# Patient Record
Sex: Male | Born: 1940 | Race: White | Hispanic: No | Marital: Married | Smoking: Former smoker
Health system: Southern US, Community
[De-identification: ages and names within clinical notes are randomized; demographics above are authoritative.]

## PROBLEM LIST (undated history)

## (undated) DIAGNOSIS — E785 Hyperlipidemia, unspecified: Secondary | ICD-10-CM

## (undated) DIAGNOSIS — I219 Acute myocardial infarction, unspecified: Secondary | ICD-10-CM

## (undated) DIAGNOSIS — I739 Peripheral vascular disease, unspecified: Secondary | ICD-10-CM

## (undated) DIAGNOSIS — I519 Heart disease, unspecified: Secondary | ICD-10-CM

## (undated) DIAGNOSIS — I10 Essential (primary) hypertension: Secondary | ICD-10-CM

## (undated) DIAGNOSIS — G4730 Sleep apnea, unspecified: Secondary | ICD-10-CM

## (undated) DIAGNOSIS — I2510 Atherosclerotic heart disease of native coronary artery without angina pectoris: Secondary | ICD-10-CM

## (undated) DIAGNOSIS — E119 Type 2 diabetes mellitus without complications: Secondary | ICD-10-CM

## (undated) HISTORY — DX: Peripheral vascular disease, unspecified: I73.9

## (undated) HISTORY — PX: GALLBLADDER SURGERY: SHX652

## (undated) HISTORY — DX: Atherosclerotic heart disease of native coronary artery without angina pectoris: I25.10

## (undated) HISTORY — PX: LEG SURGERY: SHX1003

## (undated) HISTORY — DX: Type 2 diabetes mellitus without complications: E11.9

## (undated) HISTORY — DX: Acute myocardial infarction, unspecified: I21.9

## (undated) HISTORY — DX: Hyperlipidemia, unspecified: E78.5

## (undated) HISTORY — DX: Essential (primary) hypertension: I10

## (undated) HISTORY — DX: Sleep apnea, unspecified: G47.30

## (undated) HISTORY — DX: Heart disease, unspecified: I51.9

---

## 1987-02-16 HISTORY — PX: CARDIAC CATHETERIZATION: SHX172

## 1988-02-16 HISTORY — PX: CARDIAC CATHETERIZATION: SHX172

## 2005-02-03 ENCOUNTER — Ambulatory Visit: Payer: Self-pay | Admitting: Otolaryngology

## 2007-04-07 ENCOUNTER — Emergency Department: Payer: Self-pay | Admitting: Emergency Medicine

## 2007-04-07 ENCOUNTER — Other Ambulatory Visit: Payer: Self-pay

## 2007-04-12 ENCOUNTER — Ambulatory Visit: Payer: Self-pay | Admitting: Internal Medicine

## 2008-05-24 ENCOUNTER — Ambulatory Visit: Payer: Self-pay | Admitting: Unknown Physician Specialty

## 2008-11-30 ENCOUNTER — Ambulatory Visit: Payer: Self-pay | Admitting: Unknown Physician Specialty

## 2011-10-04 ENCOUNTER — Ambulatory Visit: Payer: Self-pay | Admitting: Vascular Surgery

## 2011-10-04 LAB — BASIC METABOLIC PANEL
BUN: 23 mg/dL — ABNORMAL HIGH (ref 7–18)
Calcium, Total: 9.5 mg/dL (ref 8.5–10.1)
Chloride: 95 mmol/L — ABNORMAL LOW (ref 98–107)
Co2: 29 mmol/L (ref 21–32)
Creatinine: 0.97 mg/dL (ref 0.60–1.30)
Glucose: 182 mg/dL — ABNORMAL HIGH (ref 65–99)
Osmolality: 273 (ref 275–301)
Sodium: 132 mmol/L — ABNORMAL LOW (ref 136–145)

## 2012-05-05 ENCOUNTER — Encounter: Payer: Self-pay | Admitting: Neurology

## 2012-05-16 ENCOUNTER — Encounter: Payer: Self-pay | Admitting: Neurology

## 2012-06-15 ENCOUNTER — Encounter: Payer: Self-pay | Admitting: Neurology

## 2012-07-16 ENCOUNTER — Encounter: Payer: Self-pay | Admitting: Neurology

## 2012-08-15 ENCOUNTER — Encounter: Payer: Self-pay | Admitting: Neurology

## 2012-11-06 ENCOUNTER — Ambulatory Visit: Payer: Self-pay | Admitting: Internal Medicine

## 2012-11-15 ENCOUNTER — Ambulatory Visit: Payer: Self-pay | Admitting: Internal Medicine

## 2012-12-16 ENCOUNTER — Ambulatory Visit: Payer: Self-pay | Admitting: Internal Medicine

## 2013-05-24 DIAGNOSIS — K625 Hemorrhage of anus and rectum: Secondary | ICD-10-CM | POA: Insufficient documentation

## 2013-08-28 DIAGNOSIS — E538 Deficiency of other specified B group vitamins: Secondary | ICD-10-CM | POA: Insufficient documentation

## 2013-08-28 DIAGNOSIS — E039 Hypothyroidism, unspecified: Secondary | ICD-10-CM | POA: Insufficient documentation

## 2013-09-10 DIAGNOSIS — R270 Ataxia, unspecified: Secondary | ICD-10-CM | POA: Insufficient documentation

## 2013-09-10 DIAGNOSIS — M549 Dorsalgia, unspecified: Secondary | ICD-10-CM | POA: Insufficient documentation

## 2014-06-04 NOTE — Op Note (Signed)
PATIENT NAME:  Allen [name], Allen [name] MR#:  [id number] DATE OF BIRTH:  Jun 27, 1940  DATE OF PROCEDURE:  10/04/2011  PREOPERATIVE DIAGNOSES:  1. Peripheral arterial disease with claudication, right lower extremity.  2. Diabetes.   POSTOPERATIVE DIAGNOSES:  1. Peripheral arterial disease with claudication, right lower extremity.  2. Diabetes.   PROCEDURE: 1. Catheter placement into right popliteal artery from left femoral approach.  2. Aortogram and selective right lower extremity angiogram. 3. Percutaneous transluminal angioplasty of distal superficial femoral artery with 6-mm diameter angioplasty balloon.  4. Self-expanding stent placement to the distal superficial femoral artery for greater than 50% residual stenosis after angioplasty.  5. StarClose closure device, left femoral artery.   SURGEON: Algernon Huxley, M.D.   ANESTHESIA: Local with moderate conscious sedation.   ESTIMATED BLOOD LOSS: 25 mL.   FLUOROSCOPY TIME: Six minutes.  CONTRAST: 30 mL.   INDICATION FOR PROCEDURE: This is a 74 year old white male with peripheral arterial disease. He has right lower extremity claudication, which is lifestyle limiting. He was seen in the office and given the option of intervention versus conservative management with exercise regimen. He trialed that but had persistent symptoms and was offered intervention. Risks and benefits were discussed. Informed consent was obtained.   DESCRIPTION OF PROCEDURE: The patient was brought to the vascular and interventional radiology suite. Groins were shaved and prepped and a sterile surgical field was created. The left femoral head was localized with fluoroscopy and the left femoral artery was accessed without difficulty with a micropuncture needle. Micropuncture wire and sheath were placed. I then up-sized to a 5 [place] sheath over a J-wire. A pigtail catheter was placed in the aorta at the L1-L2 level. AP aortogram was performed. This showed no flow-limiting stenosis  in the aortoiliac segments with patent renal arteries bilaterally. I then hooked the aortic bifurcation and advanced to the right femoral head. Selective right lower extremity angiogram was then performed. This demonstrated patent common femoral arteries and profunda femoris artery. The superficial femoral artery was patent proximally. In the distal segment there was an area of heavy calcification was short-segment occlusion, which reconstituted at Hunter's canal. The popliteal artery was then patent and there was two-vessel runoff distally. The patient was systemically heparinized. A 6 French high flex Ansel sheath was placed over a Terumo advantage wire. I was able to cross the lesion without difficulty with a Terumo advantage wire and a Kumpe catheter and confirm intraluminal flow in the popliteal artery. I then replaced the wire and percutaneous transluminal angioplasty was performed of the SFA lesion with two inflations with a 6-mm diameter angioplasty balloon. Following this there was still a greater than 50% residual stenosis and I elected to treat this with a self-expanding stent. A 7-mm diameter x 12-cm in length self-expanding stent was placed and ironed out with a 6-mm balloon with excellent angiographic completion result and only mild residual stenosis. At this point I elected to terminate the procedure. The sheath was removed. StarClose closure device was deployed in the usual fashion with excellent hemostatic result. The patient tolerated the procedure well and was taken to the recovery room in stable condition.     ____________________________ Algernon Huxley, MD jsd:bjt D: 10/04/2011 09:33:57 ET T: 10/04/2011 11:05:25 ET JOB#: [id number]  cc: Algernon Huxley, MD, <Dictator> Algernon Huxley MD ELECTRONICALLY SIGNED 10/05/2011 8:29

## 2014-06-14 DIAGNOSIS — Z8679 Personal history of other diseases of the circulatory system: Secondary | ICD-10-CM | POA: Insufficient documentation

## 2014-08-22 ENCOUNTER — Other Ambulatory Visit: Payer: Self-pay | Admitting: Neurology

## 2014-08-22 DIAGNOSIS — R262 Difficulty in walking, not elsewhere classified: Secondary | ICD-10-CM | POA: Insufficient documentation

## 2014-08-22 DIAGNOSIS — M545 Low back pain, unspecified: Secondary | ICD-10-CM

## 2014-08-28 ENCOUNTER — Ambulatory Visit
Admission: RE | Admit: 2014-08-28 | Discharge: 2014-08-28 | Disposition: A | Discharge status: Home / Self Care | Payer: Medicare Other | Source: Ambulatory Visit | Attending: Neurology | Admitting: Neurology

## 2014-08-28 DIAGNOSIS — M4807 Spinal stenosis, lumbosacral region: Secondary | ICD-10-CM | POA: Insufficient documentation

## 2014-08-28 DIAGNOSIS — M545 Low back pain, unspecified: Secondary | ICD-10-CM

## 2014-08-28 DIAGNOSIS — M7138 Other bursal cyst, other site: Secondary | ICD-10-CM | POA: Diagnosis not present

## 2014-08-28 DIAGNOSIS — M4806 Spinal stenosis, lumbar region: Secondary | ICD-10-CM | POA: Insufficient documentation

## 2014-10-03 ENCOUNTER — Ambulatory Visit: Payer: Medicare Other | Attending: Neurology | Admitting: Physical Therapy

## 2014-10-03 ENCOUNTER — Encounter: Payer: Self-pay | Admitting: Physical Therapy

## 2014-10-03 DIAGNOSIS — M545 Low back pain, unspecified: Secondary | ICD-10-CM

## 2014-10-03 DIAGNOSIS — R29898 Other symptoms and signs involving the musculoskeletal system: Secondary | ICD-10-CM | POA: Diagnosis present

## 2014-10-03 NOTE — Patient Instructions (Signed)
(  Clinic) Flexion: Pelvic Tilt   Lie with neck supported, knees bent, feet flat. Tighten and suck stomach in, pushing back down against surface. Do not push down with legs. Repeat _12___ times per set. Do __2__ sets per session. Do __5__ sessions per week.  Copyright  VHI. All rights reserved.  Diaphragmatic - Supine   Inhale through nose making navel move out toward hands. Exhale through puckered lips, hands follow navel in. Repeat _15__ times. Rest __2_ seconds between repeats. Do __3_ times per day.  Copyright  VHI. All rights reserved.

## 2014-10-04 NOTE — Therapy (Addendum)
[place] [facility] SERVICES [address] [place], [place], [address] Phone: [phone]   Fax:  [phone]  Physical Therapy Evaluation  Patient Details  Name: Allen [name] MRN: [id number] Date of Birth: 01/23/1941 Referring Provider:  Anabel Bene, MD  Encounter Date: 10/03/2014      PT End of Session - 10/07/14 0912    Visit Number 1   Number of Visits 17   Date for PT Re-Evaluation 11/28/14   Authorization Type gcode 1   Authorization Time Period 10    PT Start Time 1605   PT Stop Time 1710   PT Time Calculation (min) 65 min   Equipment Utilized During Treatment Gait belt   Activity Tolerance Patient limited by pain;Patient tolerated treatment well   Behavior During Therapy [facility] for tasks assessed/performed      Past Medical History  Diagnosis Date  . Diabetes mellitus without complication   . Hypertension   . Heart disease     No past surgical history on file.  There were no vitals filed for this visit.  Visit Diagnosis:  Leg weakness, bilateral - Plan: PT plan of care cert/re-cert  Bilateral low back pain without sciatica - Plan: PT plan of care cert/re-cert      Subjective Assessment - 10/07/14 0911    Subjective This patient is a pleasant 74 year old male that reports to PT with severe back pain with standing and walking (8/10 at worst, 1/10 at best) . Over the past month the pain has gotten much worse causing significant decrease in physical activity. He states that he has recently developed radicular symptoms into the lateral and anterior portion of his proximal thighs. Pain in his low back started 20 years ago, over the last 5-6 years the pain has increased steadily, with loss of function due to pain within the last couple of months.    Pertinent History Heart attack in 1989, second heart attack in 1990. History of diabetes that is controlled . Stent placed in the R LE 2-3 years prior. PVD, CAD, Hypertension.    Limitations Standing;Walking;House hold activities   How long can you sit comfortably? 10-15 minutes and he has to adjust    How long can you stand comfortably? not able to stand without discomfort    How long can you walk comfortably? approx 500 ft.    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 be able to kayak.    Pain Onset More than a month ago            [facility] PT Assessment - 10/07/14 0001    Assessment   Medical Diagnosis Difficulty walking/ chronic back pain   Onset Date/Surgical Date 09/12/14   Hand Dominance Left   Next MD Visit 11/2014    Prior Therapy PT 3 years prior for back pain. Reports that he was able to walk with greater confidence, but still had pain    Precautions   Precautions Fall   Precaution Comments no sensation in the [place] residence   Living Arrangements Spouse/significant other   Available Help at Discharge Family   Type of [place] to enter   Entrance Stairs-Number of Steps 4   Entrance Stairs-Rails None   Home  Layout One level   Home Equipment Grab bars - tub/shower;Kasandra Knudsen - single point   Prior Function   Level of Independence Independent with basic ADLs   Vocation Retired   Leisure Kayak go for walks    Cognition   Overall Cognitive Status Within Functional Limits for tasks assessed   Observation/Other Assessments   Modified [place] 52% impaired, (indicates severe disability)    Sensation   Light Touch Impaired by gross assessment   Additional Comments Decreased sensation distal to mid calf region    Posture/Postural Control   Posture Comments Patient demonstrates forward head and rounded shoulders in sitting and standing. Slight increase in lumbar lordosis in stance.    AROM   Overall AROM Comments Patient demonstrates  grossly UE motion within normal limits. LE AROM  grossly within funcitonal limits.    PROM   Overall PROM Comments Hip flexion with over pressure causes significant increase in low back pain. Hip extension in sidelying beyond neutral increases pain in the low back    Strength   Overall Strength Comments Bilateral UE 5/5 throughout.    Right Hip Flexion 4/5   Right Hip Extension 4/5   Right Hip ABduction 4+/5   Right Hip ADduction 4+/5   Left Hip Flexion 4/5   Left Hip Extension 4/5   Left Hip ABduction 4+/5   Left Hip ADduction 4+/5   Right Knee Flexion 4/5   Right Knee Extension 4+/5   Left Knee Flexion 4/5   Left Knee Extension 4+/5   Right Ankle Dorsiflexion 4/5   Right Ankle Plantar Flexion 4-/5   Left Ankle Dorsiflexion 4/5   Left Ankle Plantar Flexion 4-/5   Palpation   Palpation comment Sensitive to grade II central PA as well as R and L rotational mobilization to L3-L5, with pain in the lumbar spine. No radicular symptoms noted,   Special Tests    Special Tests Lumbar   Straight Leg Raise   Findings Positive   Side  Right  and positive on L   Comment Pain in the Low back at approx 60 degrees for hip flexion, no radicular symptoms noted.    Bed Mobility   Bed Mobility --  Independent but painful   Transfers   Comments Independent with use of hands    Ambulation/Gait   Gait Comments Patient ambulates with SPC, wide BOS, externally rotated LE bilaterally, decreased cadence, decreased step length and height. Reports that he has radicular symptoms following 74mWT in anterior thigh.    Standardized Balance Assessment   Five times sit to stand comments  20 seconds (> 15 seconds indicates fall risk )    10 Meter Walk 0.55 m/s ( <1.0 m/s indicates fall risk)   Berg Balance Test   Sit to Stand Able to stand  independently using hands   Standing Unsupported Able to stand 30 seconds unsupported  due to back pain    Sitting with Back Unsupported but Feet Supported on Floor or  Stool Able to sit safely and securely 2 minutes   Stand to Sit Sits safely with minimal use of hands   Transfers Able to transfer safely, minor use of hands   Standing Unsupported with Eyes Closed Able to stand 10 seconds safely   Standing Ubsupported with Feet Together Able to place feet together independently and stand 1 minute safely   From Standing, Reach Forward with Outstretched Arm Can reach forward >12 cm safely (5")   From Standing Position, Pick up Object from  Floor Able to pick up shoe safely and easily   From Standing Position, Turn to Look Behind Over each Shoulder Looks behind from both sides and weight shifts well   Turn 360 Degrees Able to turn 360 degrees safely one side only in 4 seconds or less   Standing Unsupported, Alternately Place Feet on Step/Stool Able to stand independently and safely and complete 8 steps in 20 seconds   Standing Unsupported, One Foot in Front Needs help to step but can hold 15 seconds   Standing on One Leg Able to lift leg independently and hold equal to or more than 3 seconds   Total Score 46   High Level Balance   High Level Balance Comments Berg balance scale indicates moderate fall risk.                            PT Education - 10/07/14 0911    Education provided Yes   Education Details plan of care, HEP- see patient instructions   Person(s) Educated Patient   Methods Explanation;Demonstration;Tactile cues;Verbal cues;Handout   Comprehension Verbalized understanding;Returned demonstration;Verbal cues required;Tactile cues required             PT Long Term Goals - 10/04/14 1159    PT LONG TERM GOAL #1   Title Patient will be independent with HEP by 11/28/14 to allow greater function within the home by 11/28/14   Time 8   Period Weeks   Status New   PT LONG TERM GOAL #2   Title [place] will be able to stand for 10 minutes with only slight pain in the low back to allow him to perform meal prep at home by 10/13    Time 8   Period Weeks   Status New   PT LONG TERM GOAL #3   Title Patient will improve modified ODI to <40% to indicate only moderate disability by 10/13   Time 8   Period Weeks   Status New   PT LONG TERM GOAL #4   Title Patient will increase all LE MMT to 5/5 in hip/knee to allow greater ease of access to the community by 10/13   Time 8   Period Weeks   Status New   PT LONG TERM GOAL #5   Title Patient will complete 6 min walk test of >1000 feet with maximum low back pain of <2/10 to improve community ambulation tolerance by 11/28/14.   Time 8   Period Weeks   Status New               Plan - 10/07/14 0912    Pt will benefit from skilled therapeutic intervention in order to improve on the following deficits Abnormal gait;Decreased activity tolerance;Decreased balance;Decreased coordination;Decreased endurance;Decreased mobility;Decreased knowledge of use of DME;Decreased range of motion;Decreased strength;Difficulty walking;Hypomobility;Increased fascial restricitons;Increased muscle spasms;Impaired perceived functional ability;Impaired flexibility;Impaired sensation;Improper body mechanics;Postural dysfunction;Obesity;Pain   Rehab Potential Fair   Clinical Impairments Affecting Rehab Potential Negative: Chronic condition, co-morbidities. Positive: motivated to improve, good results from previous PT.    PT Frequency 2x / week   PT Duration 8 weeks   PT Treatment/Interventions ADLs/Self Care Home Management;Cryotherapy;Electrical Stimulation;Moist Heat;Traction;Ultrasound;DME Instruction;Gait training;Stair training;Functional mobility training;Therapeutic activities;Therapeutic exercise;Balance training;Neuromuscular re-education;Patient/family education;Manual techniques;Passive range of motion;Dry needling;Energy conservation   PT Next Visit Plan increased core stabilizaiton    PT Home Exercise Plan HEP initiated - see patient instructions    Consulted and Agree with Plan of  Care  Patient         Problem List There are no active problems to display for this patient.  Barrie Folk SPT 10/07/2014   9:13 AM  This entire session was performed under direct supervision and direction of a licensed therapist . I have personally read, edited and approve of the note as written.  Hopkins,Margaret PT,DPT 10/07/2014, 9:13 AM  [place] [facility] SERVICES [address] [place], [place], [address] Phone: [phone]   Fax:  [phone]

## 2014-10-07 ENCOUNTER — Ambulatory Visit: Payer: Medicare Other | Admitting: Physical Therapy

## 2014-10-07 ENCOUNTER — Encounter: Payer: Self-pay | Admitting: Physical Therapy

## 2014-10-07 DIAGNOSIS — R29898 Other symptoms and signs involving the musculoskeletal system: Secondary | ICD-10-CM | POA: Diagnosis not present

## 2014-10-07 DIAGNOSIS — M545 Low back pain, unspecified: Secondary | ICD-10-CM

## 2014-10-07 NOTE — Therapy (Signed)
[place] MAIN [facility] SERVICES [address] [place], [place], [address] Phone: [phone]   Fax:  [phone]  Physical Therapy Treatment  Patient Details  Name: Allen [name] MRN: [id number] Date of Birth: [birth year] Referring Provider:  Anabel Bene, MD  Encounter Date: 10/07/2014      PT End of Session - 10/07/14 1518    Visit Number 2   Number of Visits 17   Date for PT Re-Evaluation 11/28/14   Authorization Type gcode 1   Authorization Time Period 10    PT Start Time 0310   PT Stop Time 0355   PT Time Calculation (min) 45 min   Equipment Utilized During Treatment Gait belt   Activity Tolerance Patient limited by pain;Patient tolerated treatment well   Behavior During Therapy [facility] for tasks assessed/performed      Past Medical History  Diagnosis Date  . Diabetes mellitus without complication   . Hypertension   . Heart disease     History reviewed. No pertinent past surgical history.  There were no vitals filed for this visit.  Visit Diagnosis:  Leg weakness, bilateral  Bilateral low back pain without sciatica      Subjective Assessment - 10/07/14 1516    Subjective Patient says that he was here 3 years ago to work on his balance and now is here because his back pain is constant as soon as he stands up and the pain is also going down towards his knees. His pain in 5/10 today.    Pertinent History Heart attack in 1989, second heart attack in 1990. History of diabetes that is controlled . Stent placed in the R LE 2-3 years prior. PVD, CAD, Hypertension.    How long can you sit comfortably? 10-15 minutes and he has to adjust    How long can you stand comfortably? not able to stand without discomfort    How long can you walk comfortably? approx 500 ft.    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 Yes   Pain Score 5     Pain Location Back            [facility] PT Assessment - 10/07/14 0001    Assessment   Medical Diagnosis Difficulty walking/ chronic back pain   Onset Date/Surgical Date 09/12/14   Hand Dominance Left   Next MD Visit 11/2014    Prior Therapy PT 3 years prior for back pain. Reports that he was able to walk with greater confidence, but still had pain    Precautions   Precautions Fall   Precaution Comments no sensation in the [place] residence   Living Arrangements Spouse/significant other   Available Help at Discharge Family   Type of [place] to enter   Entrance Stairs-Number of Steps 4   Entrance Stairs-Rails None   Home Layout One level   [place] bars - tub/shower;Kasandra Knudsen - single point   Prior Function   Level of Independence Independent with basic ADLs   Vocation Retired   Leisure [place] go for walks    Cognition   Overall Cognitive Status Within Functional Limits for tasks assessed   Observation/Other Assessments   Modified [place] 52% impaired, (indicates severe disability)    Sensation   Light Touch Impaired by gross assessment  Additional Comments Decreased sensation distal to mid calf region    Posture/Postural Control   Posture Comments Patient demonstrates forward head and rounded shoulders in sitting and standing. Slight increase in lumbar lordosis in stance.    AROM   Overall AROM Comments Patient demonstrates grossly UE motion within normal limits. LE AROM  grossly within funcitonal limits.    PROM   Overall PROM Comments Hip flexion with over pressure causes significant increase in low back pain. Hip extension in sidelying beyond neutral increases pain in the low back    Strength   Overall Strength Comments Bilateral UE 5/5 throughout.    Right Hip Flexion 4/5   Right Hip Extension 4/5   Right Hip ABduction 4+/5   Right Hip ADduction 4+/5   Left Hip Flexion 4/5   Left Hip  Extension 4/5   Left Hip ABduction 4+/5   Left Hip ADduction 4+/5   Right Knee Flexion 4/5   Right Knee Extension 4+/5   Left Knee Flexion 4/5   Left Knee Extension 4+/5   Right Ankle Dorsiflexion 4/5   Right Ankle Plantar Flexion 4-/5   Left Ankle Dorsiflexion 4/5   Left Ankle Plantar Flexion 4-/5   Palpation   Palpation comment Sensitive to grade II central PA as well as R and L rotational mobilization to L3-L5, with pain in the lumbar spine. No radicular symptoms noted,   Special Tests    Special Tests Lumbar   Straight Leg Raise   Findings Positive   Side  Right  and positive on L   Comment Pain in the Low back at approx 60 degrees for hip flexion, no radicular symptoms noted.    Bed Mobility   Bed Mobility --  Independent but painful   Transfers   Comments Independent with use of hands    Ambulation/Gait   Gait Comments Patient ambulates with SPC, wide BOS, externally rotated LE bilaterally, decreased cadence, decreased step length and height. Reports that he has radicular symptoms following 37mWT in anterior thigh.    Standardized Balance Assessment   Five times sit to stand comments  20 seconds (> 15 seconds indicates fall risk )    10 Meter Walk 0.55 m/s ( <1.0 m/s indicates fall risk)   Berg Balance Test   Sit to Stand Able to stand  independently using hands   Standing Unsupported Able to stand 30 seconds unsupported  due to back pain    Sitting with Back Unsupported but Feet Supported on Floor or Stool Able to sit safely and securely 2 minutes   Stand to Sit Sits safely with minimal use of hands   Transfers Able to transfer safely, minor use of hands   Standing Unsupported with Eyes Closed Able to stand 10 seconds safely   Standing Ubsupported with Feet Together Able to place feet together independently and stand 1 minute safely   From Standing, Reach Forward with Outstretched Arm Can reach forward >12 cm safely (5")   From Standing Position, Pick up Object from  Floor Able to pick up shoe safely and easily   From Standing Position, Turn to Look Behind Over each Shoulder Looks behind from both sides and weight shifts well   Turn 360 Degrees Able to turn 360 degrees safely one side only in 4 seconds or less   Standing Unsupported, Alternately Place Feet on Step/Stool Able to stand independently and safely and complete 8 steps in 20 seconds   Standing Unsupported, One Foot in Front Needs  help to step but can hold 15 seconds   Standing on One Leg Able to lift leg independently and hold equal to or more than 3 seconds   Total Score 46   High Level Balance   High Level Balance Comments Berg balance scale indicates moderate fall risk.       Therapeutic exercise:  Nu-step BUE/BLE x 7 minutes ( not billed) Squats x 20 x 2 sets in parallel bars 4 way hip x 4 Leg press x 90 lbs     E-stim x 20 mintues IFC                         PT Education - 10/07/14 1518    Education provided Yes   Education Details HEP   Person(s) Educated Patient   Methods Explanation   Comprehension Verbalized understanding             PT Long Term Goals - 10/04/14 1159    PT LONG TERM GOAL #1   Title Patient will be independent with HEP by 11/28/14 to allow greater function within the home by 11/28/14   Time 8   Period Weeks   Status New   PT LONG TERM GOAL #2   Title Patinet will be able to stand for 10 minutes with only slight pain in the low back to allow him to perform meal prep at home by 10/13   Time 8   Period Weeks   Status New   PT LONG TERM GOAL #3   Title Patient will improve modified ODI to <40% to indicate only moderate disability by 10/13   Time 8   Period Weeks   Status New   PT LONG TERM GOAL #4   Title Patient will increase all LE MMT to 5/5 in hip/knee to allow greater ease of access to the community by 10/13   Time 8   Period Weeks   Status New   PT LONG TERM GOAL #5   Title Patient will complete 6 min walk test of  >1000 feet with maximum low back pain of <2/10 to improve community ambulation tolerance by 11/28/14.   Time 8   Period Weeks   Status New               Plan - 10/07/14 1519    Clinical Impression Statement Patient has had back pain for the last 10-15 years. Now he is having pain with standing and walking and it wont ease off. He says that ice and heat does not make it better. He performed standing extension and it did not make the pain better or worse.  Patietn was seen for e-stim to decrease his pain and general LE strengtheing exercises.    Pt will benefit from skilled therapeutic intervention in order to improve on the following deficits Abnormal gait;Decreased activity tolerance;Decreased balance;Decreased coordination;Decreased endurance;Decreased mobility;Decreased knowledge of use of DME;Decreased range of motion;Decreased strength;Difficulty walking;Hypomobility;Increased fascial restricitons;Increased muscle spasms;Impaired perceived functional ability;Impaired flexibility;Impaired sensation;Improper body mechanics;Postural dysfunction;Obesity;Pain   Rehab Potential Fair   Clinical Impairments Affecting Rehab Potential Negative: Chronic condition, co-morbidities. Positive: motivated to improve, good results from previous PT.    PT Frequency 2x / week   PT Duration 8 weeks   PT Treatment/Interventions ADLs/Self Care Home Management;Cryotherapy;Electrical Stimulation;Moist Heat;Traction;Ultrasound;DME Instruction;Gait training;Stair training;Functional mobility training;Therapeutic activities;Therapeutic exercise;Balance training;Neuromuscular re-education;Patient/family education;Manual techniques;Passive range of motion;Dry needling;Energy conservation   PT Next Visit Plan increased core stabilizaiton    PT Home  Exercise Plan HEP initiated - see patient instructions    Consulted and Agree with Plan of Care Patient        Problem List There are no active problems to display  for this patient.   Alanson Puls 10/07/2014, 3:30 PM  [place] MAIN [facility] SERVICES [address] [place], [place], [address] Phone: [phone]   Fax:  [phone]

## 2014-10-09 ENCOUNTER — Ambulatory Visit: Payer: Medicare Other | Admitting: Physical Therapy

## 2014-10-09 ENCOUNTER — Encounter: Payer: Self-pay | Admitting: Physical Therapy

## 2014-10-09 DIAGNOSIS — R29898 Other symptoms and signs involving the musculoskeletal system: Secondary | ICD-10-CM | POA: Diagnosis not present

## 2014-10-09 DIAGNOSIS — M545 Low back pain, unspecified: Secondary | ICD-10-CM

## 2014-10-09 NOTE — Therapy (Signed)
[place] MAIN [facility] SERVICES [address] [place], [place], [address] Phone: [phone]   Fax:  [phone]  Physical Therapy Treatment  Patient Details  Name: Allen [name] MRN: [id number] Date of Birth: 1940/08/29 Referring Provider:  Anabel Bene, MD  Encounter Date: 10/09/2014      PT End of Session - 10/09/14 1750    Visit Number 3   Number of Visits 17   Date for PT Re-Evaluation 11/28/14   Authorization Type gcode 3   Authorization Time Period 10    PT Start Time 1440   PT Stop Time 1530   PT Time Calculation (min) 50 min   Equipment Utilized During Treatment Gait belt   Activity Tolerance Patient limited by pain;Patient tolerated treatment well   Behavior During Therapy [facility] for tasks assessed/performed      Past Medical History  Diagnosis Date  . Diabetes mellitus without complication   . Hypertension   . Heart disease     History reviewed. No pertinent past surgical history.  There were no vitals filed for this visit.  Visit Diagnosis:  Leg weakness, bilateral  Bilateral low back pain without sciatica      Subjective Assessment - 10/09/14 1447    Subjective Patient arrived late to PT  due to car trouble. Patient states that he is in a little pain upon arrival to PT 4/10.     Pertinent History Heart attack in 1989, second heart attack in 1990. History of diabetes that is controlled . Stent placed in the R LE 2-3 years prior. PVD, CAD, Hypertension.    How long can you sit comfortably? 10-15 minutes and he has to adjust    How long can you stand comfortably? not able to stand without discomfort    How long can you walk comfortably? approx 500 ft.    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 Yes   Pain Score 4    Pain Location Back   Pain Orientation Left;Right;Mid   Pain Descriptors / Indicators Dull    Pain Type Chronic pain   Pain Radiating Towards LE etremities    Pain Onset More than a month ago   Pain Frequency Constant   Aggravating Factors  standing/walking    Pain Relieving Factors sitting   Effect of Pain on Daily Activities decreased phyisical activity         Treatment:  Nustep 4 minutes, level 4 (unbilled)   Supine therex:  diaphragmatic breathing 2x12  march with red tband 2x12 each LE  SLR 2x10 each LE  Hip abduction, green, 2x10 bilaterally Pelvic rocks 2x12  Mini bridge with UE use x5 ( pt reports increased back pain)   PT provided moderate verbal and tactile instruction to improve deep core activation with each exercise in order to increase core stability. Cues also provided to increase ROM and decrease speed of eccentric movement.   PT applied mechanical traction to patient in supine for the lumbar spine. 12 minutes with 20 second hold at 88# and 10 second rest at 50#. Patient monitored throughout entire traction treatment; no adverse affect noted or reported.  Patient states that there was reduction of pain from 4/10 to 1/10 upon completion of mechanical traction. Pain increased to a 3/10 with gait of 15 ft following traction.  PT Education - 10/09/14 1454    Education provided Yes   Education Details Core stability, LE strengthening, mechanical traction    Person(s) Educated Patient   Methods Explanation;Tactile cues;Verbal cues;Demonstration   Comprehension Verbalized understanding;Returned demonstration;Verbal cues required             PT Long Term Goals - 10/04/14 1159    PT LONG TERM GOAL #1   Title Patient will be independent with HEP by 11/28/14 to allow greater function within the home by 11/28/14   Time 8   Period Weeks   Status New   PT LONG TERM GOAL #2   Title Patinet will be able to stand for 10 minutes with only slight pain in the low back to allow him to perform meal prep at home by  10/13   Time 8   Period Weeks   Status New   PT LONG TERM GOAL #3   Title Patient will improve modified ODI to <40% to indicate only moderate disability by 10/13   Time 8   Period Weeks   Status New   PT LONG TERM GOAL #4   Title Patient will increase all LE MMT to 5/5 in hip/knee to allow greater ease of access to the community by 10/13   Time 8   Period Weeks   Status New   PT LONG TERM GOAL #5   Title Patient will complete 6 min walk test of >1000 feet with maximum low back pain of <2/10 to improve community ambulation tolerance by 11/28/14.   Time 8   Period Weeks   Status New               Plan - 10/09/14 1751    Clinical Impression Statement Patient instructed in core stabilization exercises as well as LE strengthening. PT provided moderate verbal instruction to increased core stability, proper positioning, and proper speed of exercises. Patient demonstrated moderate response to instructions. PT also applied mechanical traction; Patient reports decrease in pain to only 1/10. Pain returned to 3/10 after walking for 15 ft following traction. Continued skilled PT is recommended to increase strength, improve gait, and balance, as well as reduce pain in the low back and in the legs to increase function within the community.     Pt will benefit from skilled therapeutic intervention in order to improve on the following deficits Abnormal gait;Decreased activity tolerance;Decreased balance;Decreased coordination;Decreased endurance;Decreased mobility;Decreased knowledge of use of DME;Decreased range of motion;Decreased strength;Difficulty walking;Hypomobility;Increased fascial restricitons;Increased muscle spasms;Impaired perceived functional ability;Impaired flexibility;Impaired sensation;Improper body mechanics;Postural dysfunction;Obesity;Pain   Rehab Potential Fair   Clinical Impairments Affecting Rehab Potential Negative: Chronic condition, co-morbidities. Positive: motivated to  improve, good results from previous PT.    PT Frequency 2x / week   PT Duration 8 weeks   PT Treatment/Interventions ADLs/Self Care Home Management;Cryotherapy;Electrical Stimulation;Moist Heat;Traction;Ultrasound;DME Instruction;Gait training;Stair training;Functional mobility training;Therapeutic activities;Therapeutic exercise;Balance training;Neuromuscular re-education;Patient/family education;Manual techniques;Passive range of motion;Dry needling;Energy conservation   PT Next Visit Plan increased core stabilizaiton    PT Home Exercise Plan continue as given    Consulted and Agree with Plan of Care Patient        Problem List There are no active problems to display for this patient.  Barrie Folk SPT 10/10/2014   10:14 AM  This entire session was performed under direct supervision and direction of a licensed therapist . I have personally read, edited and approve of the note as written.  Hopkins,Margaret, PT, DPT 10/10/2014, 10:14 AM  [place]  [place] MAIN [facility] SERVICES [place], [place], [address] Phone: [phone]   Fax:  [phone]

## 2014-10-14 ENCOUNTER — Ambulatory Visit: Payer: Medicare Other | Admitting: Physical Therapy

## 2014-10-14 DIAGNOSIS — M545 Low back pain, unspecified: Secondary | ICD-10-CM

## 2014-10-14 DIAGNOSIS — R29898 Other symptoms and signs involving the musculoskeletal system: Secondary | ICD-10-CM

## 2014-10-14 NOTE — Therapy (Signed)
[place] MAIN [facility] SERVICES [address] [place], [place], [address] Phone: [phone]   Fax:  [phone]  Physical Therapy Treatment  Patient Details  Name: Allen [name] MRN: [id number] Date of Birth: 05/20/1940 Referring Provider:  Anabel Bene, MD  Encounter Date: 10/14/2014      PT End of Session - 10/14/14 1558    Visit Number 4   Number of Visits 17   Date for PT Re-Evaluation 11/28/14   Authorization Type gcode 4   Authorization Time Period 10    PT Start Time 1445   PT Stop Time 1545   PT Time Calculation (min) 60 min   Equipment Utilized During Treatment Gait belt   Activity Tolerance Patient limited by pain;Patient tolerated treatment well   Behavior During Therapy [facility] for tasks assessed/performed      Past Medical History  Diagnosis Date  . Diabetes mellitus without complication   . Hypertension   . Heart disease     History reviewed. No pertinent past surgical history.  There were no vitals filed for this visit.  Visit Diagnosis:  Leg weakness, bilateral  Bilateral low back pain without sciatica      Subjective Assessment - 10/14/14 1451    Subjective Patient reports that he is doing well upon arrival to PT with Low back pain at 1/10. He states that he had pain 6/10 over the weekend.    Pertinent History Heart attack in 1989, second heart attack in 1990. History of diabetes that is controlled . Stent placed in the R LE 2-3 years prior. PVD, CAD, Hypertension.    How long can you sit comfortably? 10-15 minutes and he has to adjust    How long can you stand comfortably? not able to stand without discomfort    How long can you walk comfortably? approx 500 ft.    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 Yes   Pain Score 1    Pain Location Back   Pain Orientation Left;Right;Lower   Pain Descriptors  / Indicators Dull   Pain Type Chronic pain   Pain Onset More than a month ago   Pain Frequency Constant   Aggravating Factors  Standing/walking    Pain Relieving Factors sitting    Effect of Pain on Daily Activities decreased physical activity           Treatment:  Nustep 4 minutes, level 4 (unbilled)   Supine therex:  diaphragmatic breathing x1 minute   march with green tband x10 each LE  Hip abduction, green, x10 bilaterally Pelvic rocks 2x12  Mini bridge with UE use x10 ( No increase in pack pain noted)   Seated with green tband :  BLE marches x12 BLE Hip abduction x12    For seated and supine therex, PT provided moderate verbal and tactile instruction to improve deep core activation with each exercise in order to increase core stability. Cues also provided to increase ROM, decreased compensation from the trunk, and decrease speed of eccentric movement.   Sit to stand x10  SLS with UE support 2x30 seconds  Each LE  SLS without UE SUpport 2x15 seconds each LE (up to 7 seconds without HHA)  Standing on airex pad normal BOS eyes open/eyes closed 2x30 seconds each  Standing on airex pad, head nods, 2x 30 seconds  Standing on airex pad  Head  turns R and L, 2x10  PT provided CGA  And moderate verbal instruction for increased use of hip and ankle strategy to prevent posterior LOB. Patient responded well to instruction, but demonstrated difficulty with maintaining balance with eyes closed on airex pad. Patient reports pain increased to 6/10 following standing balance exercises in the low back, no pain reported in the LE.    PT applied mechanical traction to patient in supine for the lumbar spine. 12 minutes with 20 second hold at 95# and 10 second rest at 50#. Patient monitored throughout entire traction treatment; no adverse affect noted or reported. Patient states that there was reduction of pain from 6/10 to 3/10 upon completion of mechanical traction.                         PT Education - 10/14/14 1558    Education provided Yes   Education Details Core stability. LE strengthening. balance, mechanical traction   Person(s) Educated Patient   Methods Explanation;Demonstration;Tactile cues;Verbal cues   Comprehension Verbalized understanding;Verbal cues required;Tactile cues required;Returned demonstration             PT Long Term Goals - 10/04/14 1159    PT LONG TERM GOAL #1   Title Patient will be independent with HEP by 11/28/14 to allow greater function within the home by 11/28/14   Time 8   Period Weeks   Status New   PT LONG TERM GOAL #2   Title Patinet will be able to stand for 10 minutes with only slight pain in the low back to allow him to perform meal prep at home by 10/13   Time 8   Period Weeks   Status New   PT LONG TERM GOAL #3   Title Patient will improve modified ODI to <40% to indicate only moderate disability by 10/13   Time 8   Period Weeks   Status New   PT LONG TERM GOAL #4   Title Patient will increase all LE MMT to 5/5 in hip/knee to allow greater ease of access to the community by 10/13   Time 8   Period Weeks   Status New   PT LONG TERM GOAL #5   Title Patient will complete 6 min walk test of >1000 feet with maximum low back pain of <2/10 to improve community ambulation tolerance by 11/28/14.   Time 8   Period Weeks   Status New               Plan - 10/14/14 1559    Clinical Impression Statement Patient reports that he has less back pain upon arrival to PT than he did last week. Patient instructed in Core stabilization exercises as well as LE strengthening and balance exercises. Patient required moderate verbal instruction with LE strengthening and core exercises including proper breathing technique, improved deep core activation, increased ROM, and decreased compensation from the trunk; Moderate response from the patient from instruction. Patient required moderate verbal  instruction with balance exercises for proper weight shifting improved hip strategy. PT performed mechanical traction upon completion of therex and balance exercise; Patient states that it reduced his pain from standing balance exercise, but did not reduce pain to pre-treatment pain levels. Continued skilled PT is recommended to increase LE strength, improve balance, and decrease back pain to allow return to prior level of function.     Pt will benefit from skilled therapeutic intervention in order to improve on the following deficits  Abnormal gait;Decreased activity tolerance;Decreased balance;Decreased coordination;Decreased endurance;Decreased mobility;Decreased knowledge of use of DME;Decreased range of motion;Decreased strength;Difficulty walking;Hypomobility;Increased fascial restricitons;Increased muscle spasms;Impaired perceived functional ability;Impaired flexibility;Impaired sensation;Improper body mechanics;Postural dysfunction;Obesity;Pain   Rehab Potential Fair   Clinical Impairments Affecting Rehab Potential Negative: Chronic condition, co-morbidities. Positive: motivated to improve, good results from previous PT.    PT Frequency 2x / week   PT Duration 8 weeks   PT Treatment/Interventions ADLs/Self Care Home Management;Cryotherapy;Electrical Stimulation;Moist Heat;Traction;Ultrasound;DME Instruction;Gait training;Stair training;Functional mobility training;Therapeutic activities;Therapeutic exercise;Balance training;Neuromuscular re-education;Patient/family education;Manual techniques;Passive range of motion;Dry needling;Energy conservation   PT Next Visit Plan increased core stabilizaiton    PT Home Exercise Plan continue as given    Consulted and Agree with Plan of Care Patient        Problem List There are no active problems to display for this patient.  Barrie Folk SPT 10/15/2014   9:29 AM  This entire session was performed under direct supervision and direction of a  licensed therapist . I have personally read, edited and approve of the note as written.  Hopkins,Margaret, PT, DPT 10/15/2014, 9:29 AM  [place] MAIN [facility] SERVICES [address] [place], [place], [address] Phone: [phone]   Fax:  [phone]

## 2014-10-15 ENCOUNTER — Encounter: Payer: Self-pay | Admitting: Physical Therapy

## 2014-10-16 ENCOUNTER — Encounter: Payer: Medicare Other | Admitting: Physical Therapy

## 2014-10-17 ENCOUNTER — Encounter: Payer: Self-pay | Admitting: Physical Therapy

## 2014-10-17 ENCOUNTER — Ambulatory Visit: Payer: Medicare Other | Attending: Neurology | Admitting: Physical Therapy

## 2014-10-17 DIAGNOSIS — M545 Low back pain, unspecified: Secondary | ICD-10-CM

## 2014-10-17 DIAGNOSIS — R29898 Other symptoms and signs involving the musculoskeletal system: Secondary | ICD-10-CM | POA: Insufficient documentation

## 2014-10-17 NOTE — Patient Instructions (Signed)
EXTENSION: Sitting - Resistance Band (Active)   Sit with feet flat. Against yellow resistance band, straighten right knee. Complete _2__ sets of _10__ repetitions. Perform _2__ sessions per day.  Copyright  VHI. All rights reserved.  FLEXION: Sitting - Resistance Band (Active)   Sit with right leg extended. Against yellow resistance band, bend knee and draw foot backward. Complete _2__ sets of _10__ repetitions. Perform _2__ sessions per day.  http://gtsc.exer.us/230   Copyright  VHI. All rights reserved.  FLEXION: Sitting - Resistance Band (Active)   Sit, both feet flat. Against yellow resistance band, lift right knee toward ceiling. Complete _2 __ sets of __10_ repetitions. Perform _2__ sessions per day.  http://gtsc.exer.us/20   Copyright  VHI. All rights reserved.

## 2014-10-17 NOTE — Therapy (Signed)
[place] MAIN [facility] SERVICES [address] [place], [place], [address] Phone: [phone]   Fax:  [phone]  Physical Therapy Treatment  Patient Details  Name: Allen [name] MRN: [id number] Date of Birth: 02-15-1941 Referring Provider:  Anabel Bene, MD  Encounter Date: 10/17/2014      PT End of Session - 10/17/14 1704    Visit Number 5   Number of Visits 17   Date for PT Re-Evaluation 11/28/14   Authorization Type gcode 5   Authorization Time Period 10    PT Start Time 1518   PT Stop Time 1619   PT Time Calculation (min) 61 min   Equipment Utilized During Treatment Gait belt   Activity Tolerance Patient limited by pain;Patient tolerated treatment well   Behavior During Therapy [facility] for tasks assessed/performed      Past Medical History  Diagnosis Date  . Diabetes mellitus without complication   . Hypertension   . Heart disease     History reviewed. No pertinent past surgical history.  There were no vitals filed for this visit.  Visit Diagnosis:  Leg weakness, bilateral  Bilateral low back pain without sciatica      Subjective Assessment - 10/17/14 1524    Subjective Patient states the since the last PT session he has had a significant increase in pain that started in the morning on 8/30. He states that he woke up and was in pain 7/10 and the pain was constant while standing. Pain did decrease while in sitting but was still present. Upon arrival to PT patient states that he was walking with pain  6/10. He reports that sitting on nustep he has no pain     Pertinent History Heart attack in 1989, second heart attack in 1990. History of diabetes that is controlled . Stent placed in the R LE 2-3 years prior. PVD, CAD, Hypertension.    How long can you sit comfortably? 10-15 minutes and he has to adjust    How long can you stand comfortably? not able to stand without discomfort    How long can you walk comfortably? approx 500 ft.     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 Yes   Pain Score 6    Pain Location Back   Pain Orientation Right;Left;Lower   Pain Descriptors / Indicators Sharp   Pain Type Chronic pain   Pain Radiating Towards LE extremities   Pain Onset More than a month ago   Aggravating Factors  Standing    Pain Relieving Factors sitting    Effect of Pain on Daily Activities Decreased physical activity        Treatment:  Nustep level 4, 4 minutes with heat to the low back (unbilled)   Supine therex:  diaphragmatic breathing x1 minute  march with green tband 2x12 each LE  Hip abduction, green, 2x12 bilaterally Pelvic rocks 2x12  SLR 2x12 Mini bridge with UE use 2 x12 ( No increase in pack pain noted)  Leg press on Quantum machine 2x12 130# Calf press on quantum maching 2x 12 90#  HEP education  Seated with green tband :  BLE marches x12 BLE Hip abduction x12  BLE knee extension x12  BLE knee flexion   For seated and supine therex, PT provided moderate verbal and tactile instruction to improve deep core activation with each exercise in order  to increase core stability. Cues also provided to increase ROM, decreased compensation from the trunk, and decrease speed of eccentric movement.   Seated on therapy ball  Marches 2x10  Knee extension 2x 10  Hip abduction with green tband 2x10  Resisted trunk rotations with green tand 2 x10   PT provided CGA to stabilize therapy ball. Moderate verbal instruction for increased core stabilization and activation of deep core musculature and increased erect posture.                        PT Education - 10/17/14 1703    Education provided Yes   Education Details Core stability. LE strengthening,    Person(s) Educated Patient   Methods Explanation;Demonstration;Verbal cues   Comprehension Verbalized  understanding;Returned demonstration;Verbal cues required             PT Long Term Goals - 10/04/14 1159    PT LONG TERM GOAL #1   Title Patient will be independent with HEP by 11/28/14 to allow greater function within the home by 11/28/14   Time 8   Period Weeks   Status New   PT LONG TERM GOAL #2   Title Patinet will be able to stand for 10 minutes with only slight pain in the low back to allow him to perform meal prep at home by 10/13   Time 8   Period Weeks   Status New   PT LONG TERM GOAL #3   Title Patient will improve modified ODI to <40% to indicate only moderate disability by 10/13   Time 8   Period Weeks   Status New   PT LONG TERM GOAL #4   Title Patient will increase all LE MMT to 5/5 in hip/knee to allow greater ease of access to the community by 10/13   Time 8   Period Weeks   Status New   PT LONG TERM GOAL #5   Title Patient will complete 6 min walk test of >1000 feet with maximum low back pain of <2/10 to improve community ambulation tolerance by 11/28/14.   Time 8   Period Weeks   Status New               Plan - 10/17/14 1704    Clinical Impression Statement Patient reports to PT with significant increase in back pain. Patient instructed in LE strengthening and Core stability exercises. PT provided Min-Mod verbal instruction for proper exercise technique including increased eccentric control and improve activation of deep core muscles. PT instruction patient in increase LE strengthening HEP, Min verbal instruction for set up and speed of movement. Good response noted from patient. Following PT treatment, patient reports that he has no pain, even with standing and walking. Continued PT is recommended to improve LE strength and decrease back pain to return patient to PLOF.   Pt will benefit from skilled therapeutic intervention in order to improve on the following deficits Abnormal gait;Decreased activity tolerance;Decreased balance;Decreased  coordination;Decreased endurance;Decreased mobility;Decreased knowledge of use of DME;Decreased range of motion;Decreased strength;Difficulty walking;Hypomobility;Increased fascial restricitons;Increased muscle spasms;Impaired perceived functional ability;Impaired flexibility;Impaired sensation;Improper body mechanics;Postural dysfunction;Obesity;Pain   Rehab Potential Fair   Clinical Impairments Affecting Rehab Potential Negative: Chronic condition, co-morbidities. Positive: motivated to improve, good results from previous PT.    PT Frequency 2x / week   PT Duration 8 weeks   PT Treatment/Interventions ADLs/Self Care Home Management;Cryotherapy;Electrical Stimulation;Moist Heat;Traction;Ultrasound;DME Instruction;Gait training;Stair training;Functional mobility training;Therapeutic activities;Therapeutic exercise;Balance training;Neuromuscular re-education;Patient/family education;Manual techniques;Passive range  of motion;Dry needling;Energy conservation   PT Next Visit Plan increased core stabilizaiton    PT Home Exercise Plan advanced - see patient instructions    Consulted and Agree with Plan of Care Patient        Problem List There are no active problems to display for this patient.  Barrie Folk SPT 10/17/2014   5:18 PM  This entire session was performed under direct supervision and direction of a licensed therapist . I have personally read, edited and approve of the note as written.  Hopkins,Margaret PT,DPT 10/17/2014, 5:18 PM  [place] MAIN [facility] SERVICES [address] [place], [place], [address] Phone: [phone]   Fax:  [phone]

## 2014-10-23 ENCOUNTER — Ambulatory Visit: Payer: Medicare Other | Admitting: Physical Therapy

## 2014-10-23 ENCOUNTER — Encounter: Payer: Self-pay | Admitting: Physical Therapy

## 2014-10-23 DIAGNOSIS — R29898 Other symptoms and signs involving the musculoskeletal system: Secondary | ICD-10-CM

## 2014-10-23 DIAGNOSIS — M545 Low back pain, unspecified: Secondary | ICD-10-CM

## 2014-10-23 NOTE — Therapy (Signed)
[place] MAIN [facility] SERVICES [address] [place], [place], [address] Phone: [phone]   Fax:  [phone]  Physical Therapy Treatment  Patient Details  Name: Allen [name] MRN: [id number] Date of Birth: [birth year] Referring Provider:  Anabel Bene, MD  Encounter Date: 10/23/2014      PT End of Session - 10/23/14 1304    Visit Number 6   Number of Visits 17   Date for PT Re-Evaluation 11/28/14   Authorization Type gcode 6   Authorization Time Period 10    PT Start Time 1258   PT Stop Time 0145   PT Time Calculation (min) 767 min   Equipment Utilized During Treatment Gait belt   Activity Tolerance Patient limited by pain;Patient tolerated treatment well   Behavior During Therapy [facility] for tasks assessed/performed      Past Medical History  Diagnosis Date  . Diabetes mellitus without complication   . Hypertension   . Heart disease     History reviewed. No pertinent past surgical history.  There were no vitals filed for this visit.  Visit Diagnosis:  Leg weakness, bilateral  Bilateral low back pain without sciatica      Subjective Assessment - 10/23/14 1300    Subjective Patient reports that he had a rough morning. He states that while he was in the shower the sliding door came off the track, so he and his wife had to work together to lift it and set it back up. He states that his back and LE pain is 5-6/10 upon arrival to PT.    Pertinent History Heart attack in 1989, second heart attack in 1990. History of diabetes that is controlled . Stent placed in the R LE 2-3 years prior. PVD, CAD, Hypertension.    How long can you sit comfortably? 10-15 minutes and he has to adjust    How long can you stand comfortably? not able to stand without discomfort    How long can you walk comfortably? approx 500 ft.    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 Yes   Pain Score 5    Pain Location Back   Pain Orientation Right;Left;Lower   Pain Descriptors / Indicators Sharp   Pain Type Chronic pain   Pain Onset More than a month ago   Pain Frequency Constant   Aggravating Factors  standing    Pain Relieving Factors sitting    Effect of Pain on Daily Activities decreased physical activity.          treatment:  Nustep 4 minutes, level 4 (unbilled)   Seated HEP re-education green tband    BLE Hip flexion x12 BLE Hip abduction x12 BLE Knee flexion x12 BLE Knee extension x12  Min verbal instruction provided to increase ROM, proper HEP set up, and increase eccentric control to improve strengthening.   Seated on therapy ball  Hip flexion x10 each LE  BLE knee extension x10   Resisted trunk rotations to the L and R x10 each direction Diaphragmatic breathing x1 minute  CGA provided to increase safety. Min verbal and tactile instruction to increase deep core activation, improve stability and increase ROM to increase core activition. Patient responded well to instruction   Supine:  Bridges 2x10  Min verbal instruction for increased hold time and and increase ROM  Prone  Hip extension 2x10  Min verbal  instruction to reduce low back activation.   Manual therapy: Passive hip flexion stretch 2x30 seconds BLE PT perform Grade II-III PA and R and L unilateral mobilizations to T9-L5 2 bouts of 30 seconds at each level. Tenderness noted at L1-L3. Patient reports decreased pain with standing following manual therapy.                       PT Education - 10/24/14 0739    Education provided Yes   Education Details Core stability LE strengthening   Person(s) Educated Patient   Methods Explanation;Demonstration;Tactile cues;Verbal cues   Comprehension Verbalized understanding;Returned demonstration;Verbal cues required;Tactile cues required             PT Long Term Goals - 10/04/14 1159     PT LONG TERM GOAL #1   Title Patient will be independent with HEP by 11/28/14 to allow greater function within the home by 11/28/14   Time 8   Period Weeks   Status New   PT LONG TERM GOAL #2   Title Patinet will be able to stand for 10 minutes with only slight pain in the low back to allow him to perform meal prep at home by 10/13   Time 8   Period Weeks   Status New   PT LONG TERM GOAL #3   Title Patient will improve modified ODI to <40% to indicate only moderate disability by 10/13   Time 8   Period Weeks   Status New   PT LONG TERM GOAL #4   Title Patient will increase all LE MMT to 5/5 in hip/knee to allow greater ease of access to the community by 10/13   Time 8   Period Weeks   Status New   PT LONG TERM GOAL #5   Title Patient will complete 6 min walk test of >1000 feet with maximum low back pain of <2/10 to improve community ambulation tolerance by 11/28/14.   Time 8   Period Weeks   Status New               Plan - 10/23/14 1512    Clinical Impression Statement Patient instructed in LE strengthening, and core stabilization exercises. Patient required Min verbal instruction for proper exercises set up, improved deep core activation, and proper ROM with seated therex to increase strengthening. Patient responded well to instruction with improved core stabilization. PT performed PA and R and L unilateral grade II-III mobilizations to T9-L5; tenderness noted to at L1-L3. Following manual therapy, patient reports reduction of pain from 6/10 to 2/10 with standing and walking. Continued skilled PT is recommended to decrease pain, improve gait and to improve balance to allow increased function with daily activities.     Pt will benefit from skilled therapeutic intervention in order to improve on the following deficits Abnormal gait;Decreased activity tolerance;Decreased balance;Decreased coordination;Decreased endurance;Decreased mobility;Decreased knowledge of use of  DME;Decreased range of motion;Decreased strength;Difficulty walking;Hypomobility;Increased fascial restricitons;Increased muscle spasms;Impaired perceived functional ability;Impaired flexibility;Impaired sensation;Improper body mechanics;Postural dysfunction;Obesity;Pain   Rehab Potential Fair   Clinical Impairments Affecting Rehab Potential Negative: Chronic condition, co-morbidities. Positive: motivated to improve, good results from previous PT.    PT Frequency 2x / week   PT Duration 8 weeks   PT Treatment/Interventions ADLs/Self Care Home Management;Cryotherapy;Electrical Stimulation;Moist Heat;Traction;Ultrasound;DME Instruction;Gait training;Stair training;Functional mobility training;Therapeutic activities;Therapeutic exercise;Balance training;Neuromuscular re-education;Patient/family education;Manual techniques;Passive range of motion;Dry needling;Energy conservation   PT Next Visit Plan standing therex, Manual therapy.    PT Home Exercise Plan  Continue as given   Consulted and Agree with Plan of Care Patient        Problem List There are no active problems to display for this patient.  Barrie Folk SPT 10/24/2014   10:55 AM  This entire session was performed under direct supervision and direction of a licensed therapist. I have personally read, edited and approve of the note as written.   Hopkins,Margaret PT, DPT 10/24/2014, 10:55 AM  [place] MAIN [facility] SERVICES [address] [place], [place], [address] Phone: [phone]   Fax:  [phone]

## 2014-10-28 ENCOUNTER — Encounter: Payer: Self-pay | Admitting: Physical Therapy

## 2014-10-28 ENCOUNTER — Ambulatory Visit: Payer: Medicare Other | Admitting: Physical Therapy

## 2014-10-28 DIAGNOSIS — R29898 Other symptoms and signs involving the musculoskeletal system: Secondary | ICD-10-CM | POA: Diagnosis not present

## 2014-10-28 DIAGNOSIS — M545 Low back pain, unspecified: Secondary | ICD-10-CM

## 2014-10-28 NOTE — Therapy (Signed)
[place] MAIN [facility] SERVICES [address] [place], [place], [address] Phone: [phone]   Fax:  [phone]  Physical Therapy Treatment  Patient Details  Name: Allen [name] MRN: [id number] Date of Birth: 08-15-1940 Referring Provider:  Anabel Bene, MD  Encounter Date: 10/28/2014      PT End of Session - 10/28/14 1551    Visit Number 7   Number of Visits 17   Authorization Type gcode 7   Authorization Time Period 19   PT Start Time 1500   PT Stop Time 1545   PT Time Calculation (min) 45 min   Equipment Utilized During Treatment Gait belt   Activity Tolerance Patient tolerated treatment well   Behavior During Therapy [facility] for tasks assessed/performed      Past Medical History  Diagnosis Date  . Diabetes mellitus without complication   . Hypertension   . Heart disease     History reviewed. No pertinent past surgical history.  There were no vitals filed for this visit.  Visit Diagnosis:  Leg weakness, bilateral  Bilateral low back pain without sciatica      Subjective Assessment - 10/28/14 1507    Subjective Patient reports that he felt very good after last PT treatment with almost no pain for the rest of that day and Friday morning. He states that he helped his wife move furniture on Friday afternoon and reports that he has had significant back pain since that time. Upon arrival to PT states that the pain is 7/10. Patient also scheduled for cortisone injection on 10/29/14   Pertinent History Heart attack in 1989, second heart attack in 1990. History of diabetes that is controlled . Stent placed in the R LE 2-3 years prior. PVD, CAD, Hypertension.    How long can you sit comfortably? 10-15 minutes and he has to adjust    How long can you stand comfortably? not able to stand without discomfort    How long can you walk comfortably? approx 500 ft.    Diagnostic tests MRI - 7/13 mild to moderate bilateral  lateral spinal stenosis  L3-S1, also reveals spondylosis in the Lumbar spine   Currently in Pain? Yes   Pain Score 7    Pain Location Back   Pain Orientation Right;Left;Lower   Pain Descriptors / Indicators Sharp   Pain Type Chronic pain   Pain Radiating Towards LE extremities    Pain Onset More than a month ago   Pain Frequency Constant   Aggravating Factors  standing    Pain Relieving Factors sitting    Effect of Pain on Daily Activities Decreased physical          treatment:  Nustep 4 minutes, level 4 (unbilled)   Quantum press leg Press 135#  2x12   Quantum press calf raise 135# 2x12  Min verbal and tactile instruction for increase ROM and increased control with eccentric movement to increase strengthening.    Seated on therapy ball  Hip flexion x10 each LE  BLE knee extension x10  Resisted trunk rotations with R red tband  to the L and R x10 each direction Diaphragmatic breathing x1 minute  CGA provided to increase safety. Min verbal and tactile instruction to increase deep core activation, improve stability, with proper weight shift and increase ROM to increase core activition. Patient demonstrated improved control with exercises on therapy ball.   Supine:  Bridges 2x10  Min verbal instruction for increased hold time and and increase ROM  Prone  Hip extension 2x10  PT provided min verbal instruction to decrease low back contraction and increase gluteal contraction. Slight increase in pain noted from prone exercise.  Manual therapy: Passive hip flexion stretch 2x30 seconds BLE PT performed Grade II-III PA and R and L unilateral mobilizations to T9-L5 2 bouts of 30 seconds at each level. Tenderness noted at L1-L3.  PT performed Bilateral LE distraction in prone. 3x30 seconds.   Patient reports decreased pain with standing following manual therapy.                       PT Education - 10/28/14 1552    Education provided Yes   Education Details Core stability,  advanced LE strengthening    Person(s) Educated Patient   Methods Explanation;Demonstration;Verbal cues   Comprehension Verbalized understanding;Returned demonstration;Verbal cues required             PT Long Term Goals - 10/04/14 1159    PT LONG TERM GOAL #1   Title Patient will be independent with HEP by 11/28/14 to allow greater function within the home by 11/28/14   Time 8   Period Weeks   Status New   PT LONG TERM GOAL #2   Title Patinet will be able to stand for 10 minutes with only slight pain in the low back to allow him to perform meal prep at home by 10/13   Time 8   Period Weeks   Status New   PT LONG TERM GOAL #3   Title Patient will improve modified ODI to <40% to indicate only moderate disability by 10/13   Time 8   Period Weeks   Status New   PT LONG TERM GOAL #4   Title Patient will increase all LE MMT to 5/5 in hip/knee to allow greater ease of access to the community by 10/13   Time 8   Period Weeks   Status New   PT LONG TERM GOAL #5   Title Patient will complete 6 min walk test of >1000 feet with maximum low back pain of <2/10 to improve community ambulation tolerance by 11/28/14.   Time 8   Period Weeks   Status New               Plan - 10/29/14 0743    Clinical Impression Statement Patient is demonstrating slow progress with PT secondary to continued strenuous activities at home which increase back pain. Patient instructed in LE strengthening and core stabilization exercises. Patient required CGA with therapy ball core stabilization exercises. Min Verbal instructions also required for proper exercise technique including increased control with strengthening movements and increase core activation. Patient demonstrated improvement with deep core exercises and was noted to have improved control with exercises on therapy ball. Patient reports decreased pain for 7/10 to 3/10 following manual therapy to the mid to low back including grade II-III  PA and  unilateral mobilizations. Continued skilled PT is recommended to reduce pain, improve gait, increase strength and improve gait to allow return to PLOF.   Pt will benefit from skilled therapeutic intervention in order to improve on the following deficits Abnormal gait;Decreased activity tolerance;Decreased balance;Decreased coordination;Decreased endurance;Decreased mobility;Decreased knowledge of use of DME;Decreased range of motion;Decreased strength;Difficulty walking;Hypomobility;Increased fascial restricitons;Increased muscle spasms;Impaired perceived functional ability;Impaired flexibility;Impaired sensation;Improper body mechanics;Postural dysfunction;Obesity;Pain   Rehab Potential Fair   Clinical Impairments Affecting Rehab Potential Negative: Chronic condition, co-morbidities. Positive: motivated to improve, good results from previous PT.    PT  Frequency 2x / week   PT Duration 8 weeks   PT Treatment/Interventions ADLs/Self Care Home Management;Cryotherapy;Electrical Stimulation;Moist Heat;Traction;Ultrasound;DME Instruction;Gait training;Stair training;Functional mobility training;Therapeutic activities;Therapeutic exercise;Balance training;Neuromuscular re-education;Patient/family education;Manual techniques;Passive range of motion;Dry needling;Energy conservation   PT Next Visit Plan standing therex, Manual therapy.    PT Home Exercise Plan Continue as given   Consulted and Agree with Plan of Care Patient        Problem List There are no active problems to display for this patient.  Barrie Folk SPT 10/29/2014   1:18 PM  This entire session was performed under direct supervision and direction of a licensed therapist . I have personally read, edited and approve of the note as written.  Hopkins,Margaret PT, DPT 10/29/2014, 1:18 PM  [place] MAIN [facility] SERVICES [address] [place], [place], [address] Phone: [phone]   Fax:   [phone]

## 2014-10-29 ENCOUNTER — Encounter: Payer: Medicare Other | Admitting: Physical Therapy

## 2014-10-30 ENCOUNTER — Encounter: Payer: Medicare Other | Admitting: Physical Therapy

## 2014-11-04 ENCOUNTER — Ambulatory Visit: Payer: Medicare Other | Admitting: Physical Therapy

## 2014-11-04 ENCOUNTER — Encounter: Payer: Self-pay | Admitting: Physical Therapy

## 2014-11-04 DIAGNOSIS — M545 Low back pain, unspecified: Secondary | ICD-10-CM

## 2014-11-04 DIAGNOSIS — R29898 Other symptoms and signs involving the musculoskeletal system: Secondary | ICD-10-CM | POA: Diagnosis not present

## 2014-11-04 NOTE — Therapy (Signed)
[place] MAIN [facility] SERVICES [address] [place], [place], [address] Phone: [phone]   Fax:  [phone]  Physical Therapy Treatment/ Progress note  10/03/14 - 11/04/14   Patient Details  Name: Allen [name] MRN: [id number] Date of Birth: [birth year] Referring Provider:  Anabel Bene, MD  Encounter Date: 11/04/2014      PT End of Session - 11/04/14 1501    Visit Number 8   Number of Visits 17   Date for PT Re-Evaluation 11/28/14   Authorization Type gcode 1   Authorization Time Period 19   PT Start Time 1450   PT Stop Time 1546   PT Time Calculation (min) 56 min   Equipment Utilized During Treatment Gait belt   Activity Tolerance Patient tolerated treatment well   Behavior During Therapy [facility] for tasks assessed/performed      Past Medical History  Diagnosis Date  . Diabetes mellitus without complication   . Hypertension   . Heart disease     History reviewed. No pertinent past surgical history.  There were no vitals filed for this visit.  Visit Diagnosis:  Leg weakness, bilateral  Bilateral low back pain without sciatica      Subjective Assessment - 11/04/14 1457    Subjective Patient reports that he is doing pretty well upon arrival to PT. He reports that he had a cortisone injection on tuesday 9/13. He states that he no longer has sharp pain in his low back but has a constant dull pain.     Pertinent History Heart attack in 1989, second heart attack in 1990. History of diabetes that is controlled . Stent placed in the R LE 2-3 years prior. PVD, CAD, Hypertension.    How long can you sit comfortably? 10-15 minutes and he has to adjust    How long can you stand comfortably? not able to stand without discomfort    How long can you walk comfortably? approx 500 ft.    Diagnostic tests MRI - 7/13 mild to moderate bilateral  lateral spinal stenosis L3-S1, also reveals spondylosis in the Lumbar spine   Patient Stated Goals be  able to walk a mile without pain. be able to kayak.    Currently in Pain? Yes   Pain Score 2    Pain Location Back   Pain Orientation Right;Left;Lower   Pain Descriptors / Indicators Dull;Throbbing   Pain Type Chronic pain   Pain Onset More than a month ago   Pain Frequency Constant   Aggravating Factors  standing    Pain Relieving Factors sitting               Treatment:   Nustep level 4, 3 minutes (unbilled)   Patient instructed in standardized outcome measures including 6 minute walk test, 10 meter walk test and the Modified ODI.   PT also assessed LE strength via MMT. See above for results    Therex: Bridges x10  SLR for BLE x10  sidelying hip abduction x10 BLE  Prone hip extension x8 BLE   PT provided min verbal instruction for increased ROM and decreased speed of eccentric movements to increase strengthening. Patient responded very well to instruction                   PT Education - 11/04/14 1501    Education provided Yes   Education Details standardized outcome measures, LE strengthening    Person(s) Educated Patient   Methods Explanation;Demonstration;Verbal cues  Comprehension Returned demonstration;Verbalized understanding;Tactile cues required             PT Long Term Goals - 11/04/14 1502    PT LONG TERM GOAL #1   Title Patient will be independent with HEP by 11/28/14 to allow greater function within the home by 11/28/14   Time 8   Period Weeks   Status On-going   PT LONG TERM GOAL #2   Title Patinet will be able to stand for 10 minutes with only slight pain in the low back to allow him to perform meal prep at home by 10/13   Time 8   Period Weeks   Status On-going   PT LONG TERM GOAL #3   Title Patient will improve modified ODI to <40% to indicate only moderate disability by 10/13   Time 8   Period Weeks   Status Achieved   PT LONG TERM GOAL #4   Title Patient will increase all LE MMT to 5/5 in hip/knee to allow greater  ease of access to the community by 10/13   Time 8   Period Weeks   Status Partially Met   PT LONG TERM GOAL #5   Title Patient will complete 6 min walk test of >1000 feet with maximum low back pain of <2/10 to improve community ambulation tolerance by 11/28/14.   Time 8   Period Weeks   Status Partially Met               Plan - 11/04/14 1705    Clinical Impression Statement Patient instructed in LE strengthening as well as standardized outcome measures. Patient required min verbal instruction from PT for increased ROM and improved eccentric control. Patient responded well to verbal instruction from PT. Patient demonstrates improvement in function based on improved scores on the 10 meter walk test, increased strength, decreased impairment on the Modified ODI, but still demonstrates deficits. Patient was also able to walk up to near 871f with only slight increase in back pain on the 6 minute walk. Based on continued deficits found through manual muscle testing and standardized outcome measures, continued skilled PT is recommended to improve LE strength, decrease back pain, and improve gait to allow patient greater ease of access to the community.    Pt will benefit from skilled therapeutic intervention in order to improve on the following deficits Abnormal gait;Decreased activity tolerance;Decreased balance;Decreased coordination;Decreased endurance;Decreased mobility;Decreased knowledge of use of DME;Decreased range of motion;Decreased strength;Difficulty walking;Hypomobility;Increased fascial restricitons;Increased muscle spasms;Impaired perceived functional ability;Impaired flexibility;Impaired sensation;Improper body mechanics;Postural dysfunction;Obesity;Pain   Rehab Potential Fair   Clinical Impairments Affecting Rehab Potential Negative: Chronic condition, co-morbidities. Positive: motivated to improve, good results from previous PT.    PT Frequency 2x / week   PT Duration 8 weeks    PT Treatment/Interventions ADLs/Self Care Home Management;Cryotherapy;Electrical Stimulation;Moist Heat;Traction;Ultrasound;DME Instruction;Gait training;Stair training;Functional mobility training;Therapeutic activities;Therapeutic exercise;Balance training;Neuromuscular re-education;Patient/family education;Manual techniques;Passive range of motion;Dry needling;Energy conservation   PT Next Visit Plan Manual therapy. standing therex,    PT Home Exercise Plan Continue as given   Consulted and Agree with Plan of Care Patient          G-Codes - 0Oct 04, 20160912    Functional Assessment Tool Used BMerrilee JanskyBalance, 10 meter, 5 times sit<>stand, modified oswestry, clinical judgement   Functional Limitation Mobility: Walking and moving around   Mobility: Walking and Moving Around Current Status ((S0109 At least 40 percent but less than 60 percent impaired, limited or restricted   Mobility: Walking and Moving  Around Goal Status [phone]) At least 20 percent but less than 40 percent impaired, limited or restricted      Problem List There are no active problems to display for this patient.  Barrie Folk SPT 11/05/2014   9:15 AM  This entire session was performed under direct supervision and direction of a licensed therapist. I have personally read, edited and approve of the note as written.  Hopkins,Margaret PT, DPT 11/05/2014, 9:15 AM  [place] MAIN [facility] SERVICES [address] [place], [place], [address] Phone: [phone]   Fax:  [phone]

## 2014-11-06 ENCOUNTER — Encounter: Payer: Self-pay | Admitting: Physical Therapy

## 2014-11-06 ENCOUNTER — Ambulatory Visit: Payer: Medicare Other | Admitting: Physical Therapy

## 2014-11-06 DIAGNOSIS — M545 Low back pain, unspecified: Secondary | ICD-10-CM

## 2014-11-06 DIAGNOSIS — R29898 Other symptoms and signs involving the musculoskeletal system: Secondary | ICD-10-CM

## 2014-11-06 NOTE — Patient Instructions (Signed)
ABDUCTION: Standing - Resistance Band (Active)   Stand, feet flat. Against green resistance band, lift right leg out to side. Complete __2_ sets of __10_ repetitions. Perform _2__ sessions per day.  http://gtsc.exer.us/116   Copyright  VHI. All rights reserved.  EXTENSION: Standing - Resistance Band (Active)   Stand, both feet flat. Against yellow resistance band, draw right leg behind body as far as possible. Complete __2_ sets of _10__ repetitions. Perform _2_ sessions per day.  http://gtsc.exer.us/82   Copyright  VHI. All rights reserved.

## 2014-11-06 NOTE — Therapy (Signed)
[place] MAIN [facility] SERVICES [address] [place], [place], [address] Phone: [phone]   Fax:  [phone]  Physical Therapy Treatment  Patient Details  Name: Allen [name] MRN: [id number] Date of Birth: September 24, 1940 Referring Luv Mish:  Anabel Bene, MD  Encounter Date: 11/06/2014      PT End of Session - 11/06/14 1540    Visit Number 9   Number of Visits 17   Date for PT Re-Evaluation 11/28/14   Authorization Type gcode 2   Authorization Time Period 19   PT Start Time 1455   PT Stop Time 1545   PT Time Calculation (min) 50 min   Equipment Utilized During Treatment Gait belt   Activity Tolerance Patient tolerated treatment well   Behavior During Therapy [facility] for tasks assessed/performed      Past Medical History  Diagnosis Date  . Diabetes mellitus without complication   . Hypertension   . Heart disease     History reviewed. No pertinent past surgical history.  There were no vitals filed for this visit.  Visit Diagnosis:  Leg weakness, bilateral  Bilateral low back pain without sciatica      Subjective Assessment - 11/06/14 1459    Subjective Patient reports that he is "not great" upon arrival to PT. He states that he was in more pain yesterday than he has been in a while with any standing movement 8/10. He states that he is in slight pain with walking upon arrival to PT 4/10.    Pertinent History Heart attack in 1989, second heart attack in 1990. History of diabetes that is controlled . Stent placed in the R LE 2-3 years prior. PVD, CAD, Hypertension.    How long can you sit comfortably? 10-15 minutes and he has to adjust    How long can you stand comfortably? not able to stand without discomfort    How long can you walk comfortably? approx 500 ft.    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 be able to kayak.    Currently in Pain? Yes   Pain Score 4    Pain Location Back   Pain Orientation Right;Left;Lower   Pain Descriptors / Indicators Aching;Dull   Pain Type Chronic pain   Pain Radiating Towards LE extremities/ hips    Pain Onset More than a month ago   Aggravating Factors  standing    Pain Relieving Factors sitting        Treatment:   Nustep level 3, 4 minutes (unbilled)   Seated therapy ball exercises:  Diaphragmatic breathing 3x1 minute Marches 2x12 BLE  Knee extension  2x12  Hip abduction with green tband  UE press with lateral resistance from green tband 2x12  UE trunk rotation to neutral with resistance from green tband 2x12   Moderate verbal instruction for improved core activation and decreased compensation from the trunk. Patient continues to demonstrate significant difficulty core stabilization with LE movement and improper weight shift to increase stability.   Standing therex with green tband   hip abduction 2x10   hip extension 2x10   PT provided min verbal instruction for increased use of UE, decreased compensation from the trunk, and proper ROM to increase strengthening. Patient responded well to instruction  Leg press on the Quantum press 150# 2x12  Calf raises on quantum  press 100# 2x12     Verbal instruction for increased core activation, improved eccentric control of movement and increased ROM with calf raises. Patient responded well to instruction.                          PT Education - 11/06/14 1501    Education provided Yes   Education Details LE strengthening, core stability    Person(s) Educated Patient   Methods Explanation;Demonstration;Tactile cues;Verbal cues   Comprehension Returned demonstration;Verbalized understanding;Verbal cues required;Tactile cues required             PT Long Term Goals - 11/04/14 1502    PT LONG TERM GOAL #1   Title Patient will be independent with HEP  by 11/28/14 to allow greater function within the home by 11/28/14   Time 8   Period Weeks   Status On-going   PT LONG TERM GOAL #2   Title Patinet will be able to stand for 10 minutes with only slight pain in the low back to allow him to perform meal prep at home by 10/13   Time 8   Period Weeks   Status On-going   PT LONG TERM GOAL #3   Title Patient will improve modified ODI to <40% to indicate only moderate disability by 10/13   Time 8   Period Weeks   Status Achieved   PT LONG TERM GOAL #4   Title Patient will increase all LE MMT to 5/5 in hip/knee to allow greater ease of access to the community by 10/13   Time 8   Period Weeks   Status Partially Met   PT LONG TERM GOAL #5   Title Patient will complete 6 min walk test of >1000 feet with maximum low back pain of <2/10 to improve community ambulation tolerance by 11/28/14.   Time 8   Period Weeks   Status Partially Met               Plan - 11/06/14 1555    Clinical Impression Statement Patient instructed in core stabilization exercises as well as LE strengthening on this day. Moderate verbal and tactile instruction provided by PT for improve core stability, decreased compensation of the trunk, and increased ROM. Patient demonstrated mild response from PT for increased core stability with therapy ball exercises. Patient states that has decreased back pain with gait following stabilization exercises. Patient requires continued skilled PT  for improved LE strength, decreased pain, and improved function within home and within the community.    Pt will benefit from skilled therapeutic intervention in order to improve on the following deficits Abnormal gait;Decreased activity tolerance;Decreased balance;Decreased coordination;Decreased endurance;Decreased mobility;Decreased knowledge of use of DME;Decreased range of motion;Decreased strength;Difficulty walking;Hypomobility;Increased fascial restricitons;Increased muscle  spasms;Impaired perceived functional ability;Impaired flexibility;Impaired sensation;Improper body mechanics;Postural dysfunction;Obesity;Pain   Rehab Potential Fair   Clinical Impairments Affecting Rehab Potential Negative: Chronic condition, co-morbidities. Positive: motivated to improve, good results from previous PT.    PT Frequency 2x / week   PT Duration 8 weeks   PT Treatment/Interventions ADLs/Self Care Home Management;Cryotherapy;Electrical Stimulation;Moist Heat;Traction;Ultrasound;DME Instruction;Gait training;Stair training;Functional mobility training;Therapeutic activities;Therapeutic exercise;Balance training;Neuromuscular re-education;Patient/family education;Manual techniques;Passive range of motion;Dry needling;Energy conservation   PT Next Visit Plan Increased stabilizaiton, standing therex.    PT Home Exercise Plan Continue as given   Consulted and Agree with Plan of Care Patient        Problem List There are no active problems to display for this patient.  Barrie Folk SPT 11/07/2014   3:28 PM  This entire session was performed under direct supervision and direction of a licensed therapist . I have personally read, edited and approve of the note as written.  Hopkins,Margaret PT, DPT 11/07/2014, 3:28 PM  [place] MAIN [facility] SERVICES [address] [place], [place], [address] Phone: [phone]   Fax:  [phone]

## 2014-11-11 ENCOUNTER — Encounter: Payer: Self-pay | Admitting: Physical Therapy

## 2014-11-11 ENCOUNTER — Ambulatory Visit: Payer: Medicare Other | Admitting: Physical Therapy

## 2014-11-11 DIAGNOSIS — M545 Low back pain, unspecified: Secondary | ICD-10-CM

## 2014-11-11 DIAGNOSIS — R29898 Other symptoms and signs involving the musculoskeletal system: Secondary | ICD-10-CM | POA: Diagnosis not present

## 2014-11-11 NOTE — Therapy (Signed)
[place] MAIN [facility] SERVICES [address] [place], [place], [address] Phone: [phone]   Fax:  [phone]  Physical Therapy Treatment  Patient Details  Name: Allen [name] MRN: [id number] Date of Birth: [birth year] Referring Provider:  Anabel Bene, MD  Encounter Date: 11/11/2014      PT End of Session - 11/11/14 1555    Visit Number 10   Number of Visits 17   Date for PT Re-Evaluation 11/28/14   Authorization Type gcode [id number]   Authorization Time Period 10   PT Start Time 1500   PT Stop Time 1546   PT Time Calculation (min) 46 min   Equipment Utilized During Treatment Gait belt   Activity Tolerance Patient tolerated treatment well   Behavior During Therapy [facility] for tasks assessed/performed      Past Medical History  Diagnosis Date  . Diabetes mellitus without complication   . Hypertension   . Heart disease     History reviewed. No pertinent past surgical history.  There were no vitals filed for this visit.  Visit Diagnosis:  Bilateral low back pain without sciatica  Leg weakness, bilateral      Subjective Assessment - 11/11/14 1509    Subjective Patient  reports that he is doing much better today. he states that he has no pain upon arrival to PT. He reports that he took an aleve 2 hours prior to PT.    Pertinent History Heart attack in 1989, second heart attack in 1990. History of diabetes that is controlled . Stent placed in the R LE 2-3 years prior. PVD, CAD, Hypertension.    How long can you sit comfortably? 10-15 minutes and he has to adjust    How long can you stand comfortably? not able to stand without discomfort    How long can you walk comfortably? approx 500 ft.    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 be able to kayak.    Currently in Pain? No/denies   Pain  Onset More than a month ago          treatment:  Nustep 4 minutes, level 4 (unbilled)   Quantum press leg Press 150# 2x12  Quantum press calf raise 150# 2x12  Min verbal and tactile instruction for increase ROM with calf presses and increased control with eccentric movement to increase strengthening.   Seated on therapy ball  Hip flexion x10 each LE  BLE knee extension x10  Resisted press out with BUE. x10 each direction.  Diaphragmatic breathing 2x1 minute  CGA provided to increase safety. Min verbal and tactile instruction to increase deep core activation, improve stability, with proper weight shift and increase ROM to increase core activition. Patient demonstrated improved control with exercises on therapy ball.   Seated therex with green tband: Hip flexion 2x10  Knee extension 2x10  Hip abduction 2x10   PT instructed patient to increase deep core activation and improve breathing as well as increased ROM on the R LE to increase strengthening.    Standing therex:  Mini squats x 15  Standing hip abduction x12 each LE with green tband  Standing hip flexion x12 each LE with green tband  PT provided moderate instruction to increased sustained deep core activation, decreased  Compensation from the trunk and lumbar spine. Patient responded moderately to instruction.  PT Education - 11/11/14 1555    Education provided Yes   Education Details LE strengthening, core stabilization    Person(s) Educated Patient   Methods Explanation;Demonstration;Verbal cues;Tactile cues   Comprehension Verbalized understanding;Returned demonstration;Verbal cues required;Tactile cues required             PT Long Term Goals - 11/04/14 1502    PT LONG TERM GOAL #1   Title Patient will be independent with HEP by 11/28/14 to allow greater function within the home by 11/28/14   Time 8   Period Weeks   Status On-going   PT LONG TERM GOAL #2    Title Patinet will be able to stand for 10 minutes with only slight pain in the low back to allow him to perform meal prep at home by 10/13   Time 8   Period Weeks   Status On-going   PT LONG TERM GOAL #3   Title Patient will improve modified ODI to <40% to indicate only moderate disability by 10/13   Time 8   Period Weeks   Status Achieved   PT LONG TERM GOAL #4   Title Patient will increase all LE MMT to 5/5 in hip/knee to allow greater ease of access to the community by 10/13   Time 8   Period Weeks   Status Partially Met   PT LONG TERM GOAL #5   Title Patient will complete 6 min walk test of >1000 feet with maximum low back pain of <2/10 to improve community ambulation tolerance by 11/28/14.   Time 8   Period Weeks   Status Partially Met               Plan - 11/11/14 1651    Clinical Impression Statement Patient instructed in advanced core stabilization exercises as well as LE strengthening. PT provided supervision assist with core exercises on the therapy ball as well as moderate instruction to increase and maintain deep core activation throughout exercise. Patient responded well to instruction from PT. Patient instructed in standing exercises today. Patient was able to perform approximately 10 minutes of standing therex before needing a seated rest break due to pain. Continued skilled PT is recommended to increase core stability, improve LE strength and decrease pain to allow patient to improve function at home.     Pt will benefit from skilled therapeutic intervention in order to improve on the following deficits Abnormal gait;Decreased activity tolerance;Decreased balance;Decreased coordination;Decreased endurance;Decreased mobility;Decreased knowledge of use of DME;Decreased range of motion;Decreased strength;Difficulty walking;Hypomobility;Increased fascial restricitons;Increased muscle spasms;Impaired perceived functional ability;Impaired flexibility;Impaired  sensation;Improper body mechanics;Postural dysfunction;Obesity;Pain   Rehab Potential Fair   Clinical Impairments Affecting Rehab Potential Negative: Chronic condition, co-morbidities. Positive: motivated to improve, good results from previous PT.    PT Frequency 2x / week   PT Duration 8 weeks   PT Treatment/Interventions ADLs/Self Care Home Management;Cryotherapy;Electrical Stimulation;Moist Heat;Traction;Ultrasound;DME Instruction;Gait training;Stair training;Functional mobility training;Therapeutic activities;Therapeutic exercise;Balance training;Neuromuscular re-education;Patient/family education;Manual techniques;Passive range of motion;Dry needling;Energy conservation   PT Next Visit Plan Increased stabilizaiton, standing therex.    PT Home Exercise Plan Continue as given   Consulted and Agree with Plan of Care Patient        Problem List There are no active problems to display for this patient.  Barrie Folk SPT 11/12/2014   9:44 AM  This entire session was performed under direct supervision and direction of a licensed therapist . I have personally read, edited and approve of the note as written.  Hopkins,Margaret PT, DPT  11/12/2014, 9:44 AM  [place] MAIN [facility] SERVICES [address] [place], [place], [address] Phone: [phone]   Fax:  [phone]

## 2014-11-13 ENCOUNTER — Encounter: Payer: Self-pay | Admitting: Physical Therapy

## 2014-11-13 ENCOUNTER — Ambulatory Visit: Payer: Medicare Other | Admitting: Physical Therapy

## 2014-11-13 DIAGNOSIS — M545 Low back pain, unspecified: Secondary | ICD-10-CM

## 2014-11-13 DIAGNOSIS — R29898 Other symptoms and signs involving the musculoskeletal system: Secondary | ICD-10-CM

## 2014-11-14 NOTE — Therapy (Signed)
[place] MAIN [facility] SERVICES [address] [place], [place], [address] Phone: [phone]   Fax:  [phone]  Physical Therapy Treatment  Patient Details  Name: Allen [name] MRN: [id number] Date of Birth: 10/14/1940 Referring Provider:  Anabel Bene, MD  Encounter Date: 11/13/2014      PT End of Session - 11/14/14 0745    Visit Number 11   Number of Visits 17   Date for PT Re-Evaluation 11/28/14   Authorization Type gcode 4   Authorization Time Period 10   PT Start Time 1515   PT Stop Time 1600   PT Time Calculation (min) 45 min   Equipment Utilized During Treatment Gait belt   Activity Tolerance Patient tolerated treatment well   Behavior During Therapy [facility] for tasks assessed/performed      Past Medical History  Diagnosis Date  . Diabetes mellitus without complication   . Hypertension   . Heart disease     History reviewed. No pertinent past surgical history.  There were no vitals filed for this visit.  Visit Diagnosis:  Bilateral low back pain without sciatica  Leg weakness, bilateral      Subjective Assessment - 11/13/14 1524    Subjective Patient reports that he is doing "alright" upon arrival to PT. He states that he feel like he is functioning better at home since the start of PT.    Pertinent History Heart attack in 1989, second heart attack in 1990. History of diabetes that is controlled . Stent placed in the R LE 2-3 years prior. PVD, CAD, Hypertension.    How long can you sit comfortably? 10-15 minutes and he has to adjust    How long can you stand comfortably? not able to stand without discomfort    How long can you walk comfortably? approx 500 ft.    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 be able to kayak.    Currently in Pain? Yes   Pain Score 4    Pain  Location Back   Pain Orientation Left;Right;Lower   Pain Descriptors / Indicators Aching;Dull   Pain Type Chronic pain   Pain Onset More than a month ago   Pain Frequency Occasional        Treatment:  Nustep level 3, 4 minutes, (unbilled)   Lumbar rotation on therapy ball x1 minute  Deep core activation hold for 30 seconds x2  March with deep core activation 2x10  Bridges 2x10   Sitting on therapy ball.  Pelvic rocks 2x12  Marches 2x10  Hip abduction 2x10  Press out with resistance from R and L x10 each direction.   PT provided min Verbal instruction to increase deep core activation, improved breathing, proper ROM and decreased speed of movements to increase core stabilization. Patient responded well to instruction. Slight increase in low back pain with press outs with lateral resistance  Standing with green tband  Hip flexion, x10  Hip extension x10  Hip abduction x 10   PT provided min verbal instruction for improve core activation with each standing exercise as well as cues to decrease trunk movement to enhance LE strengthening and reduce pain/ compensation.   Manual therapy: Passive hip flexion stretch 2x30 seconds BLE PT perform Grade II-III PA and R and L unilateral mobilizations to T9-L5 2 bouts of 30  seconds at each level. Tenderness noted at L1-L3. Patient reports decreased pain with standing following manual therapy from 4/10 to 1/10                          PT Education - 11/14/14 0744    Education provided Yes   Education Details Core stablization, LE strengthening   Person(s) Educated Patient   Methods Explanation;Demonstration;Verbal cues   Comprehension Verbalized understanding;Returned demonstration;Verbal cues required             PT Long Term Goals - 11/04/14 1502    PT LONG TERM GOAL #1   Title Patient will be independent with HEP by 11/28/14 to allow greater function within the home by 11/28/14   Time 8   Period Weeks    Status On-going   PT LONG TERM GOAL #2   Title Patinet will be able to stand for 10 minutes with only slight pain in the low back to allow him to perform meal prep at home by 10/13   Time 8   Period Weeks   Status On-going   PT LONG TERM GOAL #3   Title Patient will improve modified ODI to <40% to indicate only moderate disability by 10/13   Time 8   Period Weeks   Status Achieved   PT LONG TERM GOAL #4   Title Patient will increase all LE MMT to 5/5 in hip/knee to allow greater ease of access to the community by 10/13   Time 8   Period Weeks   Status Partially Met   PT LONG TERM GOAL #5   Title Patient will complete 6 min walk test of >1000 feet with maximum low back pain of <2/10 to improve community ambulation tolerance by 11/28/14.   Time 8   Period Weeks   Status Partially Met               Plan - 11/14/14 0745    Clinical Impression Statement Patient instructed in increased core stabilization and LE strengthening. Patient demonstrated improved core stability with exercises on therapy ball and improved ROM with supine exercises including bridges. PT required to provide min verbal instruction for improved sustained deep core activation, with good response from patient. Patient reports increased pain with standing hip extension exercise. Manual therapy performed to lower thoracic and lumbar spine including grade II-III PA and R and L unilateral mobilizations. Decreased pain following manual therapy from 4/10 to 1/10. Continued skilled PT is recommended to improve LE strength, decrease back pain and improve ROM to increase patient's function with ADLs.   Pt will benefit from skilled therapeutic intervention in order to improve on the following deficits Abnormal gait;Decreased activity tolerance;Decreased balance;Decreased coordination;Decreased endurance;Decreased mobility;Decreased knowledge of use of DME;Decreased range of motion;Decreased strength;Difficulty  walking;Hypomobility;Increased fascial restricitons;Increased muscle spasms;Impaired perceived functional ability;Impaired flexibility;Impaired sensation;Improper body mechanics;Postural dysfunction;Obesity;Pain   Rehab Potential Fair   Clinical Impairments Affecting Rehab Potential Negative: Chronic condition, co-morbidities. Positive: motivated to improve, good results from previous PT.    PT Frequency 2x / week   PT Duration 8 weeks   PT Treatment/Interventions ADLs/Self Care Home Management;Cryotherapy;Electrical Stimulation;Moist Heat;Traction;Ultrasound;DME Instruction;Gait training;Stair training;Functional mobility training;Therapeutic activities;Therapeutic exercise;Balance training;Neuromuscular re-education;Patient/family education;Manual techniques;Passive range of motion;Dry needling;Energy conservation   PT Next Visit Plan increase HEP, increased standing HEP.    PT Home Exercise Plan Continue as given   Consulted and Agree with Plan of Care Patient        Problem List  There are no active problems to display for this patient.  Barrie Folk SPT 11/14/2014   1:33 PM  This entire session was performed under direct supervision and direction of a licensed therapist . I have personally read, edited and approve of the note as written.   Hopkins,Margaret PT, DPT 11/14/2014, 1:33 PM  [place] MAIN [facility] SERVICES [address] [place], [place], [address] Phone: [phone]   Fax:  [phone]

## 2014-11-18 ENCOUNTER — Encounter: Payer: Self-pay | Admitting: Physical Therapy

## 2014-11-18 ENCOUNTER — Ambulatory Visit: Payer: Medicare Other | Attending: Neurology | Admitting: Physical Therapy

## 2014-11-18 DIAGNOSIS — R29898 Other symptoms and signs involving the musculoskeletal system: Secondary | ICD-10-CM | POA: Diagnosis present

## 2014-11-18 DIAGNOSIS — M545 Low back pain, unspecified: Secondary | ICD-10-CM

## 2014-11-19 NOTE — Therapy (Signed)
[place] MAIN [facility] SERVICES [address] [place], [place], [address] Phone: [phone]   Fax:  [phone]  Physical Therapy Treatment  Patient Details  Name: Allen [name] MRN: [id number] Date of Birth: 1940-09-14 Referring Provider:  Anabel Bene, MD  Encounter Date: 11/18/2014      PT End of Session - 11/19/14 0751    Visit Number 12   Number of Visits 17   Date for PT Re-Evaluation 11/28/14   Authorization Type gcode 5   Authorization Time Period 10   PT Start Time 1515   PT Stop Time 1600   PT Time Calculation (min) 45 min   Equipment Utilized During Treatment Gait belt   Activity Tolerance Patient tolerated treatment well   Behavior During Therapy [facility] for tasks assessed/performed      Past Medical History  Diagnosis Date  . Diabetes mellitus without complication ([place])   . Hypertension   . Heart disease     History reviewed. No pertinent past surgical history.  There were no vitals filed for this visit.  Visit Diagnosis:  Bilateral low back pain without sciatica  Leg weakness, bilateral      Subjective Assessment - 11/18/14 1535    Subjective Patient reports that he is doing better today. He states that he was able to go most of the weekend with only slight pain.    Pertinent History Heart attack in 1989, second heart attack in 1990. History of diabetes that is controlled . Stent placed in the R LE 2-3 years prior. PVD, CAD, Hypertension.    How long can you sit comfortably? 10-15 minutes and he has to adjust    How long can you stand comfortably? not able to stand without discomfort    How long can you walk comfortably? approx 500 ft.    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 be able to kayak.    Currently in Pain? No/denies   Pain Onset More than a month ago         treatment  Nustep level 4, 3 minutes (Unbilled)   Seated on therapy ball with red tband  Hip abduction x15 BLE  Marches x15 BLE  Knee flexion x15 BLE Knee extension x15 BLE   Calf raises x15  Lunges to BOSU x12 BLE  Standing hip exercises green tband  Hip flexion x15 BLE  Hip abduction x15 BLE   Standing on airex 2x30 seconds  Step ups to airex pad. x6 BLE     Leg press 125# 2x20    PT provided Min verbal instruction for improve activation of deep core musculature, increased ROM, decreased compensation from Trunk, and increased eccentric control to increased strengthening. Patient was able to complete all standing therex without rest due to decreased back pain on this day. He was noted to have improved continual activation of deep core musculature in seated and standing therex. Patient reports that he has no pain in his low back following PT treatment, and was noted to have decreased antalgic gait pattern compared to start of PT treatment.                          PT Education - 11/19/14 0751    Education provided Yes   Education Details core stabilization, LE strengthening  Person(s) Educated Patient   Methods Explanation;Demonstration;Verbal cues   Comprehension Verbalized understanding;Returned demonstration;Verbal cues required             PT Long Term Goals - 11/04/14 1502    PT LONG TERM GOAL #1   Title Patient will be independent with HEP by 11/28/14 to allow greater function within the home by 11/28/14   Time 8   Period Weeks   Status On-going   PT LONG TERM GOAL #2   Title Pat[place] will be able to stand for 10 minutes with only slight pain in the low back to allow him to perform meal prep at home by 10/13   Time 8   Period Weeks   Status On-going   PT LONG TERM GOAL #3   Title Patient will improve modified ODI to <40% to indicate only moderate disability by 10/13   Time 8   Period Weeks   Status Achieved   PT LONG TERM  GOAL #4   Title Patient will increase all LE MMT to 5/5 in hip/knee to allow greater ease of access to the community by 10/13   Time 8   Period Weeks   Status Partially Met   PT LONG TERM GOAL #5   Title Patient will complete 6 min walk test of >1000 feet with maximum low back pain of <2/10 to improve community ambulation tolerance by 11/28/14.   Time 8   Period Weeks   Status Partially Met               Plan - 11/19/14 0752    Clinical Impression Statement Patient was instructed in LE strengthening and core stabilization exercises. PT required to provide min verbal instruction for increased deep core activation with sitting and standing therex to improve posture and decrease low back pain, as well as instruction to increase ROM and decrease compensation from the trunk. Patient responded very well to instruction. He was able to demonstrate improved deep core activation on this day and was also able to complete all standing therex without seated rest break. Continued skilled PT is recommended to improve LE strength, deep core musculature and decrease pain to allow return to PLOF.   Pt will benefit from skilled therapeutic intervention in order to improve on the following deficits Abnormal gait;Decreased activity tolerance;Decreased balance;Decreased coordination;Decreased endurance;Decreased mobility;Decreased knowledge of use of DME;Decreased range of motion;Decreased strength;Difficulty walking;Hypomobility;Increased fascial restricitons;Increased muscle spasms;Impaired perceived functional ability;Impaired flexibility;Impaired sensation;Improper body mechanics;Postural dysfunction;Obesity;Pain   Rehab Potential Fair   Clinical Impairments Affecting Rehab Potential Negative: Chronic condition, co-morbidities. Positive: motivated to improve, good results from previous PT.    PT Frequency 2x / week   PT Duration 8 weeks   PT Treatment/Interventions ADLs/Self Care Home  Management;Cryotherapy;Electrical Stimulation;Moist Heat;Traction;Ultrasound;DME Instruction;Gait training;Stair training;Functional mobility training;Therapeutic activities;Therapeutic exercise;Balance training;Neuromuscular re-education;Patient/family education;Manual techniques;Passive range of motion;Dry needling;Energy conservation   PT Next Visit Plan increase HEP,    PT Home Exercise Plan Continue as given   Consulted and Agree with Plan of Care Patient        Problem List There are no active problems to display for this patient.  Barrie Folk SPT 11/19/2014   9:38 AM  This entire session was performed under direct supervision and direction of a licensed therapist. I have personally read, edited and approve of the note as written.   Hopkins,Margaret PT, DPT 11/19/2014, 9:38 AM  [place] MAIN [facility] SERVICES [address] [place], [place], [address] Phone: [phone]  Fax:  [phone]

## 2014-11-20 ENCOUNTER — Encounter: Payer: Medicare Other | Admitting: Physical Therapy

## 2014-11-21 ENCOUNTER — Ambulatory Visit: Payer: Medicare Other | Admitting: Physical Therapy

## 2014-11-21 ENCOUNTER — Encounter: Payer: Self-pay | Admitting: Physical Therapy

## 2014-11-21 DIAGNOSIS — M545 Low back pain, unspecified: Secondary | ICD-10-CM

## 2014-11-21 DIAGNOSIS — R29898 Other symptoms and signs involving the musculoskeletal system: Secondary | ICD-10-CM

## 2014-11-22 NOTE — Therapy (Signed)
[place] MAIN [facility] SERVICES [address] [place], [place], [address] Phone: [phone]   Fax:  [phone]  Physical Therapy Treatment  Patient Details  Name: Allen [name] MRN: [id number] Date of Birth: 1940/08/01 Referring Provider:  Anabel Bene, MD  Encounter Date: 11/21/2014      PT End of Session - 11/22/14 0815    Visit Number 13   Number of Visits 17   Date for PT Re-Evaluation 11/28/14   Authorization Type gcode 6   Authorization Time Period 10   PT Start Time 1600   PT Stop Time 1645   PT Time Calculation (min) 45 min   Equipment Utilized During Treatment Gait belt   Activity Tolerance Patient tolerated treatment well   Behavior During Therapy [facility] for tasks assessed/performed      Past Medical History  Diagnosis Date  . Diabetes mellitus without complication ([place])   . Hypertension   . Heart disease     History reviewed. No pertinent past surgical history.  There were no vitals filed for this visit.  Visit Diagnosis:  Bilateral low back pain without sciatica  Leg weakness, bilateral      Subjective Assessment - 11/21/14 1610    Subjective Patient reports that that he is doing well today. He states that this has been a good week with less pain than previously.    Pertinent History Heart attack in 1989, second heart attack in 1990. History of diabetes that is controlled . Stent placed in the R LE 2-3 years prior. PVD, CAD, Hypertension.    How long can you sit comfortably? 10-15 minutes and he has to adjust    How long can you stand comfortably? not able to stand without discomfort    How long can you walk comfortably? approx 500 ft.    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 be able to kayak.    Currently in Pain? Yes   Pain Score 2    Pain Location Back   Pain Orientation Left;Right;Lower   Pain Descriptors / Indicators Aching;Dull   Pain Type Chronic pain   Pain Onset More than a month ago         treatment  Nustep level 4, 3 minutes (Unbilled)   Seated on therapy ball with red tband   Hip abduction x15 BLE   Marches x15 BLE   Knee flexion x15 BLE Knee extension x15 BLE   Calf raises x15   Mini sqaut with BUE support x 12   Standing hip exercises green tband   Hip flexion x15 BLE   Hip abduction x15 BLE   Quantum Leg press 125# 2x20    PT provided Min verbal instruction to improve activation of deep core musculature, increased ROM, decreased compensation from [place], and increased eccentric control for increased strengthening. He was noted to have improved continual activation of deep core musculature in seated and standing therex. Slight increase in back pain with standing therex on this day.    PT performed manual therapy including:  Long axis distraction of BLE 4x 30 seconds each LE  Prone Grade III mobilization of T9-L2 1 bout of 30 seconds on each segment  R and L Unilateral grade II-III mobilizations to T9-L5 1 bout of 30 seconds on each segment.   Patient reports that pain is  abolished on that side with LE long axis distraction. Patient reports low back pain at 1/10 with gait following manual therapy with decreased radicular symptoms. .                         PT Education - 11/21/14 1615    Education provided Yes   Education Details Core stabilzation LE Strengthening    Person(s) Educated Patient   Methods Explanation;Demonstration;Verbal cues   Comprehension Verbalized understanding;Returned demonstration;Verbal cues required             PT Long Term Goals - 11/04/14 1502    PT LONG TERM GOAL #1   Title Patient will be independent with HEP by 11/28/14 to allow greater function within the home by 11/28/14   Time 8   Period Weeks   Status On-going   PT LONG TERM GOAL #2   Title  [place] will be able to stand for 10 minutes with only slight pain in the low back to allow him to perform meal prep at home by 10/13   Time 8   Period Weeks   Status On-going   PT LONG TERM GOAL #3   Title Patient will improve modified ODI to <40% to indicate only moderate disability by 10/13   Time 8   Period Weeks   Status Achieved   PT LONG TERM GOAL #4   Title Patient will increase all LE MMT to 5/5 in hip/knee to allow greater ease of access to the community by 10/13   Time 8   Period Weeks   Status Partially Met   PT LONG TERM GOAL #5   Title Patient will complete 6 min walk test of >1000 feet with maximum low back pain of <2/10 to improve community ambulation tolerance by 11/28/14.   Time 8   Period Weeks   Status Partially Met               Plan - 11/22/14 0816    Clinical Impression Statement Patient instructed in core stabilization as well as LE strengthening exercises. PT provided min verbal instruction for improved core stabilization/ activation with seated and standing therex. Patient demonstrated improved core activation and required less UE assistance to maintain stability and therapy ball. PT performed manual therapy including LE long axis distraction and grade III mobilizations to decrease pain and improve spinal mobility. Patient reports decreased pain following manual therapy to 1/10. Continued skilled PT is recommended to improve LE strength and core stability to decrease pain and improve function with ADLs.   Pt will benefit from skilled therapeutic intervention in order to improve on the following deficits Abnormal gait;Decreased activity tolerance;Decreased balance;Decreased coordination;Decreased endurance;Decreased mobility;Decreased knowledge of use of DME;Decreased range of motion;Decreased strength;Difficulty walking;Hypomobility;Increased fascial restricitons;Increased muscle spasms;Impaired perceived functional ability;Impaired flexibility;Impaired  sensation;Improper body mechanics;Postural dysfunction;Obesity;Pain   Rehab Potential Fair   Clinical Impairments Affecting Rehab Potential Negative: Chronic condition, co-morbidities. Positive: motivated to improve, good results from previous PT.    PT Frequency 2x / week   PT Duration 8 weeks   PT Treatment/Interventions ADLs/Self Care Home Management;Cryotherapy;Electrical Stimulation;Moist Heat;Traction;Ultrasound;DME Instruction;Gait training;Stair training;Functional mobility training;Therapeutic activities;Therapeutic exercise;Balance training;Neuromuscular re-education;Patient/family education;Manual techniques;Passive range of motion;Dry needling;Energy conservation   PT Next Visit Plan increase standing therex if tolerated    PT Home Exercise Plan Continue as given   Consulted and Agree with Plan of Care Patient        Problem List There are no active problems to display  for this patient.  Barrie Folk SPT 11/22/2014   9:26 AM   This entire session was performed under direct supervision and direction of a licensed therapist . I have personally read, edited and approve of the note as written.  Hopkins,Margaret PT, DPT 11/22/2014, 9:26 AM  [place] MAIN [facility] SERVICES [address] [place], [place], [address] Phone: [phone]   Fax:  [phone]

## 2014-11-25 ENCOUNTER — Ambulatory Visit: Payer: Medicare Other | Admitting: Physical Therapy

## 2014-11-27 ENCOUNTER — Ambulatory Visit: Payer: Medicare Other | Admitting: Physical Therapy

## 2014-11-28 ENCOUNTER — Encounter: Payer: Medicare Other | Admitting: Physical Therapy

## 2014-12-03 ENCOUNTER — Ambulatory Visit: Payer: Medicare Other | Admitting: Physical Therapy

## 2014-12-03 DIAGNOSIS — R29898 Other symptoms and signs involving the musculoskeletal system: Secondary | ICD-10-CM

## 2014-12-03 DIAGNOSIS — M545 Low back pain, unspecified: Secondary | ICD-10-CM

## 2014-12-03 NOTE — Patient Instructions (Signed)
All exercises provided were adapted from [email or web address]. Patient was provided a written handout with pictures as described. Any additional cues were manually entered in to handout and copied in to this document.  Side stepping with mini band (lateral walks)  1) Begin in standing position with mini band or looped theraband around your ankles 2) Engage your core by lightly bringing belly button closer to the spine to maintain neutral lumbar spine while slightly bending the knees 3) Begin to move to the left by pushing off of the right foot (primarily in the heel) while maintaining tension in the band 4) Next, once your left foot has planted, slowly control your right leg back to the starting position 5) Repeat for allotted number of repetitions and perform facing the same way in the opposite direction 6) Make sure to maintain your toes pointed straight ahead and decrease the amount of rocking while moving. Most of the muscular work should be felt in the buttocks/hips     Side Step Ups  Place affected extremity onto step. Step up and touch step with other foot.    ** Hold on to the rail or your cane. Complete on both sides.   SIT TO STAND - NO HANDS  Start by sitting in a chair. Next, raise up to standing without using your hands for support.    **AS FAST AS YOU CAN on the way up

## 2014-12-04 NOTE — Therapy (Signed)
[place] MAIN [facility] SERVICES [address] [place], [place], [address] Phone: [phone]   Fax:  [phone]  Physical Therapy Treatment/Discharge Summary  Patient Details  Name: Allen [name] MRN: [id number] Date of Birth: 1940-04-29 No Data Recorded  Encounter Date: 12/03/2014      PT End of Session - 12/03/14 1027    Visit Number 14   Number of Visits 17   Authorization Type G code 1    Authorization Time Period 10   PT Start Time 0931   PT Stop Time 1015   PT Time Calculation (min) 44 min   Equipment Utilized During Treatment Gait belt   Activity Tolerance Patient tolerated treatment well   Behavior During Therapy WFL for tasks assessed/performed      Past Medical History  Diagnosis Date  . Diabetes mellitus without complication ([place])   . Hypertension   . Heart disease     No past surgical history on file.  There were no vitals filed for this visit.  Visit Diagnosis:  Bilateral low back pain without sciatica - Plan: PT plan of care cert/re-cert  Leg weakness, bilateral - Plan: PT plan of care cert/re-cert      Subjective Assessment - 12/03/14 0934    Subjective Patient reports his neurologist prescribed a new medication last week which he had an allergic reaction to. He states he is just now getting over these symptoms and is not feeling his best.    Pertinent History Heart attack in 1989, second heart attack in 1990. History of diabetes that is controlled . Stent placed in the R LE 2-3 years prior. PVD, CAD, Hypertension.    Limitations Standing;Walking;House hold activities   How long can you sit comfortably? 10-15 minutes and he has to adjust    How long can you stand comfortably? not able to stand without discomfort    How long can you walk comfortably? approx 500 ft.    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 be able to kayak.    Currently in Pain? Yes   Pain Score 4    Pain Location Back   Pain Descriptors / Indicators Aching   Pain Type Chronic pain   Pain Radiating Towards RLE   Pain Onset More than a month ago   Pain Frequency Occasional   Aggravating Factors  Standing    Pain Relieving Factors sitting    Multiple Pain Sites Yes   Pain Location Leg   Pain Orientation Right   Pain Descriptors / Indicators Aching   Pain Type Chronic pain   Pain Onset More than a month ago   Pain Frequency Intermittent   Aggravating Factors  Ambulating    Pain Relieving Factors Resting         Manual Muscle testing  Hip flexors- 5/5 bilaterally Knee extensors - 5/5 bilaterally  Knee flexors 5/5 bilaterally Hip abductors 4+/5 bilaterally   6 Minute walk test  714'   Side Stepping with band with red tband x 8 repetitions bilaterally for 2 rounds for 2 sets, cuing for increased knee flexion and upright stance. Patient initially felt in hamstrings/thighs, with cuing was able to activate hip abductors.  Sit to stand training with cuing for speed and minimal use of hands 2 sets x 8 repetitions   Lateral step ups x  12 bilaterally with unilateral HHA on rail, cuing for upright posture. Patient noted sufficient challenge and no loss of balance.                        PT Education - 12-04-14 1029    Education provided Yes   Education Details HEP progression and discharge status    Person(s) Educated Patient   Methods Explanation;Demonstration;Verbal cues;Handout   Comprehension Verbalized understanding;Returned demonstration             PT Long Term Goals - 2014/12/04 1021    PT LONG TERM GOAL #1   Title Patient will be independent with HEP by 11/28/14 to allow greater function within the home by 11/28/14   Time 8   Period Weeks   Status Achieved   PT LONG TERM GOAL #2   Title [place] will be able to stand for 10 minutes with  only slight pain in the low back to allow him to perform meal prep at home by 10/13   Time 8   Period Weeks   Status Partially Met   PT LONG TERM GOAL #3   Title Patient will improve modified ODI to <40% to indicate only moderate disability by 10/13   Time 8   Period Weeks   Status Achieved   PT LONG TERM GOAL #4   Title Patient will increase all LE MMT to 5/5 in hip/knee to allow greater ease of access to the community by 10/13   Time 8   Period Weeks   Status Achieved   PT LONG TERM GOAL #5   Title Patient will complete 6 min walk test of >1000 feet with maximum low back pain of <2/10 to improve community ambulation tolerance by 11/28/14.   Baseline Patient ambulated 58' with SPC on December 04, 2014.   Time 8   Period Weeks   Status Partially Met               Plan - 12-04-14 1022    Clinical Impression Statement Patient re-evaluated for progress and demonstrates similar outcome measures as previous measurements. Patient states today is not his best day after the allergic reaction he had to the medication last week, but that he feels he has peaked with therapy. During this session he is able to complete a closed chain ther-ex program with no increase in symptons or loss of balance. He has been consistent with his HEP and certainly progressed from his initial visit. Patient will be discharged from therapy after this session, with most of his goals met.    Pt will benefit from skilled therapeutic intervention in order to improve on the following deficits Abnormal gait;Decreased activity tolerance;Decreased balance;Decreased coordination;Decreased endurance;Decreased mobility;Decreased knowledge of use of DME;Decreased range of motion;Decreased strength;Difficulty walking;Hypomobility;Increased fascial restricitons;Increased muscle spasms;Impaired perceived functional ability;Impaired flexibility;Impaired sensation;Improper body mechanics;Postural dysfunction;Obesity;Pain   Rehab Potential  Fair   Clinical Impairments Affecting Rehab Potential Negative: Chronic condition, co-morbidities. Positive: motivated to improve, good results from previous PT.    PT Frequency 2x / week   PT Duration 8 weeks   PT Treatment/Interventions ADLs/Self Care Home Management;Cryotherapy;Electrical Stimulation;Moist Heat;Traction;Ultrasound;DME Instruction;Gait training;Stair training;Functional mobility training;Therapeutic activities;Therapeutic exercise;Balance training;Neuromuscular re-education;Patient/family education;Manual techniques;Passive range of motion;Dry needling;Energy conservation   PT Next Visit Plan Discharged on this visit    PT Home Exercise Plan See patient instructions.    Consulted and Agree with Plan of Care Patient          G-Codes - 2014/12/04 1020  Functional Assessment Tool Used 6MW, modified oswestry, berg balance, clinical judgement    Functional Limitation Mobility: Walking and moving around   Mobility: Walking and Moving Around Current Status [phone]) At least 20 percent but less than 40 percent impaired, limited or restricted   Mobility: Walking and Moving Around Discharge Status [phone]) At least 20 percent but less than 40 percent impaired, limited or restricted      Problem List There are no active problems to display for this patient.  Kerman Passey, PT, DPT   12/04/2014, 9:11 AM  [place] MAIN [facility] SERVICES [address] [place], [place], [address] Phone: [phone]   Fax:  [phone]  Name: Allen [name] MRN: [id number] Date of Birth: 09-Aug-1940

## 2015-01-07 DIAGNOSIS — M4726 Other spondylosis with radiculopathy, lumbar region: Secondary | ICD-10-CM | POA: Insufficient documentation

## 2015-01-30 DIAGNOSIS — M5136 Other intervertebral disc degeneration, lumbar region: Secondary | ICD-10-CM | POA: Insufficient documentation

## 2015-01-30 DIAGNOSIS — M5416 Radiculopathy, lumbar region: Secondary | ICD-10-CM | POA: Insufficient documentation

## 2015-03-25 DIAGNOSIS — M4806 Spinal stenosis, lumbar region: Secondary | ICD-10-CM | POA: Diagnosis not present

## 2015-03-25 DIAGNOSIS — M5416 Radiculopathy, lumbar region: Secondary | ICD-10-CM | POA: Diagnosis not present

## 2015-03-25 DIAGNOSIS — M4726 Other spondylosis with radiculopathy, lumbar region: Secondary | ICD-10-CM | POA: Diagnosis not present

## 2015-04-09 DIAGNOSIS — M4806 Spinal stenosis, lumbar region: Secondary | ICD-10-CM | POA: Diagnosis not present

## 2015-04-09 DIAGNOSIS — M5136 Other intervertebral disc degeneration, lumbar region: Secondary | ICD-10-CM | POA: Diagnosis not present

## 2015-04-09 DIAGNOSIS — M5416 Radiculopathy, lumbar region: Secondary | ICD-10-CM | POA: Diagnosis not present

## 2015-05-15 DIAGNOSIS — M1990 Unspecified osteoarthritis, unspecified site: Secondary | ICD-10-CM | POA: Diagnosis not present

## 2015-05-15 DIAGNOSIS — I10 Essential (primary) hypertension: Secondary | ICD-10-CM | POA: Diagnosis not present

## 2015-05-15 DIAGNOSIS — M5432 Sciatica, left side: Secondary | ICD-10-CM | POA: Diagnosis not present

## 2015-05-15 DIAGNOSIS — M5416 Radiculopathy, lumbar region: Secondary | ICD-10-CM | POA: Diagnosis not present

## 2015-05-15 DIAGNOSIS — M79609 Pain in unspecified limb: Secondary | ICD-10-CM | POA: Diagnosis not present

## 2015-05-15 DIAGNOSIS — M4726 Other spondylosis with radiculopathy, lumbar region: Secondary | ICD-10-CM | POA: Diagnosis not present

## 2015-05-15 DIAGNOSIS — E785 Hyperlipidemia, unspecified: Secondary | ICD-10-CM | POA: Diagnosis not present

## 2015-05-15 DIAGNOSIS — M4806 Spinal stenosis, lumbar region: Secondary | ICD-10-CM | POA: Diagnosis not present

## 2015-05-15 DIAGNOSIS — E119 Type 2 diabetes mellitus without complications: Secondary | ICD-10-CM | POA: Diagnosis not present

## 2015-05-15 DIAGNOSIS — I70213 Atherosclerosis of native arteries of extremities with intermittent claudication, bilateral legs: Secondary | ICD-10-CM | POA: Diagnosis not present

## 2015-05-15 DIAGNOSIS — I70223 Atherosclerosis of native arteries of extremities with rest pain, bilateral legs: Secondary | ICD-10-CM | POA: Diagnosis not present

## 2015-05-15 DIAGNOSIS — I739 Peripheral vascular disease, unspecified: Secondary | ICD-10-CM | POA: Diagnosis not present

## 2015-05-15 DIAGNOSIS — M5431 Sciatica, right side: Secondary | ICD-10-CM | POA: Diagnosis not present

## 2015-06-10 DIAGNOSIS — E1159 Type 2 diabetes mellitus with other circulatory complications: Secondary | ICD-10-CM | POA: Diagnosis not present

## 2015-06-10 DIAGNOSIS — E538 Deficiency of other specified B group vitamins: Secondary | ICD-10-CM | POA: Diagnosis not present

## 2015-06-10 DIAGNOSIS — E063 Autoimmune thyroiditis: Secondary | ICD-10-CM | POA: Diagnosis not present

## 2015-06-10 DIAGNOSIS — E038 Other specified hypothyroidism: Secondary | ICD-10-CM | POA: Diagnosis not present

## 2015-06-16 DIAGNOSIS — E038 Other specified hypothyroidism: Secondary | ICD-10-CM | POA: Diagnosis not present

## 2015-06-16 DIAGNOSIS — Z8679 Personal history of other diseases of the circulatory system: Secondary | ICD-10-CM | POA: Diagnosis not present

## 2015-06-16 DIAGNOSIS — E538 Deficiency of other specified B group vitamins: Secondary | ICD-10-CM | POA: Diagnosis not present

## 2015-06-16 DIAGNOSIS — E1159 Type 2 diabetes mellitus with other circulatory complications: Secondary | ICD-10-CM | POA: Diagnosis not present

## 2015-06-16 DIAGNOSIS — E063 Autoimmune thyroiditis: Secondary | ICD-10-CM | POA: Diagnosis not present

## 2015-06-19 DIAGNOSIS — E782 Mixed hyperlipidemia: Secondary | ICD-10-CM | POA: Diagnosis not present

## 2015-06-19 DIAGNOSIS — G4733 Obstructive sleep apnea (adult) (pediatric): Secondary | ICD-10-CM | POA: Diagnosis not present

## 2015-06-19 DIAGNOSIS — I10 Essential (primary) hypertension: Secondary | ICD-10-CM | POA: Diagnosis not present

## 2015-06-24 DIAGNOSIS — I10 Essential (primary) hypertension: Secondary | ICD-10-CM | POA: Diagnosis not present

## 2015-06-24 DIAGNOSIS — E782 Mixed hyperlipidemia: Secondary | ICD-10-CM | POA: Diagnosis not present

## 2015-07-01 DIAGNOSIS — E1165 Type 2 diabetes mellitus with hyperglycemia: Secondary | ICD-10-CM | POA: Diagnosis not present

## 2015-07-01 DIAGNOSIS — G4733 Obstructive sleep apnea (adult) (pediatric): Secondary | ICD-10-CM | POA: Diagnosis not present

## 2015-07-01 DIAGNOSIS — R0602 Shortness of breath: Secondary | ICD-10-CM | POA: Diagnosis not present

## 2015-07-06 ENCOUNTER — Emergency Department: Payer: Medicare Other

## 2015-07-06 ENCOUNTER — Emergency Department
Admission: EM | Admit: 2015-07-06 | Discharge: 2015-07-06 | Disposition: A | Discharge status: Home / Self Care | Payer: Medicare Other | Attending: Emergency Medicine | Admitting: Emergency Medicine

## 2015-07-06 ENCOUNTER — Encounter: Payer: Self-pay | Admitting: Emergency Medicine

## 2015-07-06 DIAGNOSIS — R05 Cough: Secondary | ICD-10-CM | POA: Diagnosis not present

## 2015-07-06 DIAGNOSIS — G4730 Sleep apnea, unspecified: Secondary | ICD-10-CM | POA: Insufficient documentation

## 2015-07-06 DIAGNOSIS — Z87891 Personal history of nicotine dependence: Secondary | ICD-10-CM | POA: Diagnosis not present

## 2015-07-06 DIAGNOSIS — I10 Essential (primary) hypertension: Secondary | ICD-10-CM | POA: Insufficient documentation

## 2015-07-06 DIAGNOSIS — Z794 Long term (current) use of insulin: Secondary | ICD-10-CM | POA: Diagnosis not present

## 2015-07-06 DIAGNOSIS — R0602 Shortness of breath: Secondary | ICD-10-CM | POA: Diagnosis not present

## 2015-07-06 DIAGNOSIS — Z7982 Long term (current) use of aspirin: Secondary | ICD-10-CM | POA: Diagnosis not present

## 2015-07-06 DIAGNOSIS — E119 Type 2 diabetes mellitus without complications: Secondary | ICD-10-CM | POA: Insufficient documentation

## 2015-07-06 LAB — CBC
HCT: 39.2 % — ABNORMAL LOW (ref 40.0–52.0)
Hemoglobin: 13.2 g/dL (ref 13.0–18.0)
MCH: 33.4 pg (ref 26.0–34.0)
MCHC: 33.7 g/dL (ref 32.0–36.0)
MCV: 99 fL (ref 80.0–100.0)
PLATELETS: 247 10*3/uL (ref 150–440)
RBC: 3.96 MIL/uL — AB (ref 4.40–5.90)
RDW: 13.6 % (ref 11.5–14.5)
WBC: 4.5 10*3/uL (ref 3.8–10.6)

## 2015-07-06 LAB — COMPREHENSIVE METABOLIC PANEL
ALT: 23 U/L (ref 17–63)
ANION GAP: 9 (ref 5–15)
AST: 31 U/L (ref 15–41)
Albumin: 4.1 g/dL (ref 3.5–5.0)
Alkaline Phosphatase: 50 U/L (ref 38–126)
BUN: 18 mg/dL (ref 6–20)
CHLORIDE: 99 mmol/L — AB (ref 101–111)
CO2: 26 mmol/L (ref 22–32)
Calcium: 9.1 mg/dL (ref 8.9–10.3)
Creatinine, Ser: 1.1 mg/dL (ref 0.61–1.24)
GFR calc non Af Amer: >60 mL/min (ref >60)
Glucose, Bld: 195 mg/dL — ABNORMAL HIGH (ref 65–99)
POTASSIUM: 4 mmol/L (ref 3.5–5.1)
SODIUM: 134 mmol/L — AB (ref 135–145)
Total Bilirubin: 0.7 mg/dL (ref 0.3–1.2)
Total Protein: 6.9 g/dL (ref 6.5–8.1)

## 2015-07-06 LAB — BRAIN NATRIURETIC PEPTIDE: B NATRIURETIC PEPTIDE 5: 26 pg/mL (ref 0.0–100.0)

## 2015-07-06 LAB — FIBRIN DERIVATIVES D-DIMER (ARMC ONLY): FIBRIN DERIVATIVES D-DIMER (ARMC): 1261 — AB (ref 0–499)

## 2015-07-06 LAB — TROPONIN I: Troponin I: <0.03 ng/mL (ref <0.031)

## 2015-07-06 NOTE — Discharge Instructions (Signed)

## 2015-07-06 NOTE — ED Provider Notes (Signed)
[facility] Emergency Department Provider Note  ____________________________________________    I have reviewed the triage vital signs and the nursing notes.   HISTORY  Chief Complaint Shortness of Breath    HPI Allen [name] is a 75 y.o. male who presents with complaints of shortness of breath. Patient reports he felt well last night and went to sleep with his CPAP machine and woke up this morning feeling like he was choking. He reports he feels somewhat better now but still more short of breath that is typical for him. He doesn't smoke. He does not have a history of CHF. No recent travel. No calf pain or swelling. No pleurisy. No chest pain.     Past Medical History  Diagnosis Date  . Diabetes mellitus without complication ([place])   . Hypertension   . Heart disease     There are no active problems to display for this patient.   History reviewed. No pertinent past surgical history.  Current Outpatient Rx  Name  Route  Sig  Dispense  Refill  . aspirin 81 MG chewable tablet   Oral   Chew by mouth daily.         Marland Kitchen b complex vitamins tablet   Oral   Take 1 tablet by mouth daily.         . cetirizine (ZYRTEC) 10 MG tablet   Oral   Take 10 mg by mouth daily.         . enalapril (VASOTEC) 20 MG tablet   Oral   Take 20 mg by mouth daily.         Marland Kitchen ezetimibe-simvastatin (VYTORIN) 10-40 MG per tablet   Oral   Take 1 tablet by mouth daily.         . folic acid (FOLVITE) A999333 MCG tablet   Oral   Take 400 mcg by mouth daily.         Marland Kitchen gabapentin (NEURONTIN) 300 MG capsule   Oral   Take 300 mg by mouth 3 (three) times daily.         . hydrochlorothiazide (HYDRODIURIL) 25 MG tablet   Oral   Take 25 mg by mouth daily.         . insulin detemir (LEVEMIR) 100 UNIT/ML injection   Subcutaneous   Inject into the skin at bedtime.         . insulin lispro (HUMALOG) 100 UNIT/ML injection   Subcutaneous   Inject into the skin 3  (three) times daily before meals.         Marland Kitchen levothyroxine (SYNTHROID, LEVOTHROID) 50 MCG tablet   Oral   Take 50 mcg by mouth daily before breakfast.         . metFORMIN (GLUCOPHAGE) 1000 MG tablet   Oral   Take 1,000 mg by mouth 2 (two) times daily with a meal.         . metoprolol (LOPRESSOR) 50 MG tablet   Oral   Take 50 mg by mouth 2 (two) times daily.         . Multiple Vitamin (MULTIVITAMIN WITH MINERALS) TABS tablet   Oral   Take 1 tablet by mouth daily.         . pioglitazone (ACTOS) 30 MG tablet   Oral   Take 30 mg by mouth daily.         . vitamin B-12 (CYANOCOBALAMIN) 1000 MCG tablet   Oral   Take 1,000 mcg by mouth daily.  Allergies Dye fdc red and Penicillins  No family history on file.  Social History Social History  Substance Use Topics  . Smoking status: Former Research scientist (life sciences)  . Smokeless tobacco: Former Systems developer    Quit date: 01/27/1988  . Alcohol Use: None    Review of Systems  Constitutional: Negative for fever. Eyes: Negative for redness ENT: Negative for sore throat Cardiovascular: Negative for chest pain Respiratory: As above Gastrointestinal: Negative for abdominal pain Genitourinary: Negative for dysuria. Musculoskeletal: Negative for back pain. Skin: Negative for rash. Neurological: Negative for headache Psychiatric: no anxiety    ____________________________________________   PHYSICAL EXAM:  VITAL SIGNS: ED Triage Vitals  Enc Vitals Group     BP 07/06/15 0749 148/56 mmHg     Pulse Rate 07/06/15 0749 65     Resp 07/06/15 0749 14     Temp 07/06/15 0749 97.7 F (36.5 C)     Temp Source 07/06/15 0749 Oral     SpO2 07/06/15 0749 98 %     Weight 07/06/15 0749 248 lb (112.492 kg)     Height 07/06/15 0749 5\' 8"  (1.727 m)     Head Cir --      Peak Flow --      Pain Score 07/06/15 0746 0     Pain Loc --      Pain Edu? --      Excl. in [place]? --      Constitutional: Alert and oriented. Well appearing and in no  distress.  Eyes: Conjunctivae are normal. No erythema or injection ENT   Head: Normocephalic and atraumatic.   Mouth/Throat: Mucous membranes are moist. Cardiovascular: Normal rate, regular rhythm. Normal and symmetric distal pulses are present in the upper extremities. Respiratory: Normal respiratory effort without tachypnea nor retractions. Scattered mild wheezes.  Gastrointestinal: Soft and non-tender in all quadrants. No distention. There is no CVA tenderness. Genitourinary: deferred Musculoskeletal: Nontender with normal range of motion in all extremities. No lower extremity tenderness nor edema. Neurologic:  Normal speech and language. No gross focal neurologic deficits are appreciated. Skin:  Skin is warm, dry and intact. No rash noted. Psychiatric: Mood and affect are normal. Patient exhibits appropriate insight and judgment.  ____________________________________________    LABS (pertinent positives/negatives)  Labs Reviewed  CBC  COMPREHENSIVE METABOLIC PANEL  TROPONIN I  BRAIN NATRIURETIC PEPTIDE    ____________________________________________   EKG  ED ECG REPORT I, Lavonia Drafts, the attending physician, personally viewed and interpreted this ECG.  Date: 07/06/2015 EKG Time: 7:50 AM Rate: 65 Rhythm: normal sinus rhythm QRS Axis: normal Intervals: normal ST/T Wave abnormalities: normal Conduction Disturbances: none Narrative Interpretation: unremarkable   ____________________________________________    RADIOLOGY  Chest x-ray unremarkable  ____________________________________________   PROCEDURES  Procedure(s) performed: none  Critical Care performed: none  ____________________________________________   INITIAL IMPRESSION / ASSESSMENT AND PLAN / ED COURSE  Pertinent labs & imaging results that were available during my care of the patient were reviewed by me and considered in my medical decision making (see chart for  details).  Patient presents with complaints of shortness of breath. Vital signs are unremarkable, mild hypertension. No acute distress. We will check labs, EKG, chest x-ray and reevaluate. Differential includes CHF, aspiration, pneumonia, sleep apnea. Less likely PE given history and no tachycardia or hypoxia   ----------------------------------------- 11:27 AM on 07/06/2015 -----------------------------------------  Patient with elevated d-dimer but feeling well and no complaints. I ordered CT angiography of his chest but patient is now reporting apparently IV dye allergy. -----------------------------------------  12:51 PM on 07/06/2015 -----------------------------------------  I had long discussion with the patient and his wife regarding the CT scan. Likelihood of PE is quite low given mildly elevated d-dimer, no pleurisy, no tachycardia. Also his description of his event today is more consistent with sleep apnea, he admits to only wearing the nasal CPAP machine and he suspects it is not working as well because he is breathing through his mouth primarily. Via shared decision making we opted to avoid doing CT currently because the patient feels very well and he does not want to do it and take the risk of an allergic reaction. I did offer him 4 hour prep and/or admission for longer prep versus VQ scan but he wants to go home. I think this is likely reasonable given that he has no symptoms at this time. He agrees to return if any worsening or change in his condition ____________________________________________   FINAL CLINICAL IMPRESSION(S) / ED DIAGNOSES  Final diagnoses:  Shortness of breath  Sleep apnea          Lavonia Drafts, MD 07/06/15 1253

## 2015-07-06 NOTE — ED Notes (Signed)
Discussed discharge instructions and follow-up care with patient. No questions or concerns at this time. Pt stable at discharge.  

## 2015-07-06 NOTE — ED Notes (Signed)
States woke up this morning feeling SOB.  Uses CPAP machine to sleep and wife says patient was choking and couldn't catch his breath.

## 2015-07-10 DIAGNOSIS — G4733 Obstructive sleep apnea (adult) (pediatric): Secondary | ICD-10-CM | POA: Diagnosis not present

## 2015-07-10 DIAGNOSIS — R0602 Shortness of breath: Secondary | ICD-10-CM | POA: Diagnosis not present

## 2015-07-16 DIAGNOSIS — G4733 Obstructive sleep apnea (adult) (pediatric): Secondary | ICD-10-CM | POA: Diagnosis not present

## 2015-07-24 DIAGNOSIS — R0602 Shortness of breath: Secondary | ICD-10-CM | POA: Diagnosis not present

## 2015-07-24 DIAGNOSIS — G4733 Obstructive sleep apnea (adult) (pediatric): Secondary | ICD-10-CM | POA: Diagnosis not present

## 2015-08-15 DIAGNOSIS — M5416 Radiculopathy, lumbar region: Secondary | ICD-10-CM | POA: Diagnosis not present

## 2015-08-15 DIAGNOSIS — M4806 Spinal stenosis, lumbar region: Secondary | ICD-10-CM | POA: Diagnosis not present

## 2015-08-15 DIAGNOSIS — M4726 Other spondylosis with radiculopathy, lumbar region: Secondary | ICD-10-CM | POA: Diagnosis not present

## 2015-10-29 ENCOUNTER — Encounter (INDEPENDENT_AMBULATORY_CARE_PROVIDER_SITE_OTHER): Payer: Self-pay | Admitting: Vascular Surgery

## 2015-10-29 DIAGNOSIS — I739 Peripheral vascular disease, unspecified: Secondary | ICD-10-CM

## 2015-10-31 DIAGNOSIS — R05 Cough: Secondary | ICD-10-CM | POA: Diagnosis not present

## 2015-10-31 DIAGNOSIS — J069 Acute upper respiratory infection, unspecified: Secondary | ICD-10-CM | POA: Diagnosis not present

## 2015-11-12 DIAGNOSIS — I2510 Atherosclerotic heart disease of native coronary artery without angina pectoris: Secondary | ICD-10-CM | POA: Diagnosis not present

## 2015-11-12 DIAGNOSIS — I70211 Atherosclerosis of native arteries of extremities with intermittent claudication, right leg: Secondary | ICD-10-CM | POA: Diagnosis not present

## 2015-11-12 DIAGNOSIS — E1165 Type 2 diabetes mellitus with hyperglycemia: Secondary | ICD-10-CM | POA: Diagnosis not present

## 2015-11-12 DIAGNOSIS — E6609 Other obesity due to excess calories: Secondary | ICD-10-CM | POA: Diagnosis not present

## 2015-11-12 DIAGNOSIS — M5117 Intervertebral disc disorders with radiculopathy, lumbosacral region: Secondary | ICD-10-CM | POA: Diagnosis not present

## 2015-11-17 DIAGNOSIS — M48062 Spinal stenosis, lumbar region with neurogenic claudication: Secondary | ICD-10-CM | POA: Diagnosis not present

## 2015-11-17 DIAGNOSIS — M4726 Other spondylosis with radiculopathy, lumbar region: Secondary | ICD-10-CM | POA: Diagnosis not present

## 2015-11-17 DIAGNOSIS — M5416 Radiculopathy, lumbar region: Secondary | ICD-10-CM | POA: Diagnosis not present

## 2015-11-21 ENCOUNTER — Other Ambulatory Visit (INDEPENDENT_AMBULATORY_CARE_PROVIDER_SITE_OTHER): Payer: Self-pay | Admitting: Vascular Surgery

## 2015-11-21 DIAGNOSIS — I739 Peripheral vascular disease, unspecified: Secondary | ICD-10-CM

## 2015-11-21 DIAGNOSIS — I70219 Atherosclerosis of native arteries of extremities with intermittent claudication, unspecified extremity: Secondary | ICD-10-CM

## 2015-11-24 ENCOUNTER — Ambulatory Visit (INDEPENDENT_AMBULATORY_CARE_PROVIDER_SITE_OTHER): Payer: Medicare Other

## 2015-11-24 ENCOUNTER — Ambulatory Visit (INDEPENDENT_AMBULATORY_CARE_PROVIDER_SITE_OTHER): Payer: Medicare Other | Admitting: Vascular Surgery

## 2015-11-24 ENCOUNTER — Encounter (INDEPENDENT_AMBULATORY_CARE_PROVIDER_SITE_OTHER): Payer: Self-pay | Admitting: Vascular Surgery

## 2015-11-24 VITALS — BP 149/65 | HR 66 | Resp 16 | Ht 68.0 in | Wt 247.0 lb

## 2015-11-24 DIAGNOSIS — I70219 Atherosclerosis of native arteries of extremities with intermittent claudication, unspecified extremity: Secondary | ICD-10-CM | POA: Diagnosis not present

## 2015-11-24 DIAGNOSIS — E785 Hyperlipidemia, unspecified: Secondary | ICD-10-CM | POA: Diagnosis not present

## 2015-11-24 DIAGNOSIS — I739 Peripheral vascular disease, unspecified: Secondary | ICD-10-CM | POA: Diagnosis not present

## 2015-11-24 DIAGNOSIS — E118 Type 2 diabetes mellitus with unspecified complications: Secondary | ICD-10-CM

## 2015-11-24 DIAGNOSIS — E1165 Type 2 diabetes mellitus with hyperglycemia: Secondary | ICD-10-CM | POA: Insufficient documentation

## 2015-11-24 DIAGNOSIS — I10 Essential (primary) hypertension: Secondary | ICD-10-CM

## 2015-11-24 NOTE — Progress Notes (Addendum)
Subjective:    Patient ID: Allen [name], male    DOB: November 18, 1940, 75 y.o.   MRN: [id number] Chief Complaint  Patient presents with  . Follow-up    ultrasound    Patient presents for a six month lower extremity atherosclerotic disease follow up. The patient underwent an ABI which showed Right ABI: >1.3 and Left 0.70 (on 05/15/15, Right ABI: 1.12 and Left 0.80). The patient denies any claudication like symptoms, rest pain or ulcers to his feet / toes.  The patient continues a regimen of ASA and dyslipidemia medication.  The patient does not use tobacco.  The patient denies any new health changes since last visit. Medication list reviewed.    Review of Systems  Constitutional: Negative.   HENT: Negative.   Eyes: Negative.   Respiratory: Negative.   Cardiovascular: Negative.   Gastrointestinal: Negative.   Endocrine: Negative.   Genitourinary: Negative.   Musculoskeletal: Negative.   Skin: Negative.   Allergic/Immunologic: Negative.   Neurological: Negative.   Hematological: Negative.   Psychiatric/Behavioral: Negative.       Objective:   Physical Exam  Constitutional: He is oriented to person, place, and time. He appears well-developed and well-nourished.  HENT:  Head: Normocephalic and atraumatic.  Eyes: Conjunctivae and EOM are normal. Pupils are equal, round, and reactive to light.  Neck: Normal range of motion.  Cardiovascular: Normal rate, regular rhythm, normal heart sounds and intact distal pulses.   Pulses:      Dorsalis pedis pulses are 1+ on the right side, and 1+ on the left side.       Posterior tibial pulses are 1+ on the right side, and 1+ on the left side.  Pulmonary/Chest: Effort normal and breath sounds normal.  Abdominal: Soft. Bowel sounds are normal.  Musculoskeletal: Normal range of motion.  Neurological: He is alert and oriented to person, place, and time.  Skin: Skin is warm and dry.  Psychiatric: He has a normal mood and affect. His behavior is  normal. Judgment and thought content normal.   BP (!) 149/65   Pulse 66   Resp 16   Ht 5\' 8"  (1.727 m)   Wt 247 lb (112 kg)   BMI 37.56 kg/m   Past Medical History:  Diagnosis Date  . Diabetes mellitus without complication ([place])   . Heart disease   . Hypertension    Social History   Social History  . Marital status: Married    Spouse name: N/A  . Number of children: N/A  . Years of education: N/A   Occupational History  . Not on file.   Social History Main Topics  . Smoking status: Former Research scientist (life sciences)  . Smokeless tobacco: Former Systems developer    Quit date: 01/27/1988  . Alcohol use Not on file  . Drug use: Unknown  . Sexual activity: Not on file   Other Topics Concern  . Not on file   Social History Narrative  . No narrative on file   No past surgical history on file.  No family history on file.  Allergies  Allergen Reactions  . Iodinated Diagnostic Agents Anaphylaxis and Rash    X-Ray contrast dyes- anaphylaxis & rash  . Iohexol Hives and Shortness Of Breath    Pt states that he had IV dye for a kidney x-ray years ago and after injection of the dye he had hives all over his body, throat swelling, and SOB.  . Duloxetine Other (See Comments)    Sick to stomach and  chills  . Dye Fdc Red [Red Dye] Hives  . Fluticasone Other (See Comments)    Caused nosebleeds  . Penicillins       Assessment & Plan:  Patient presents for a six month lower extremity atherosclerotic disease follow up. The patient underwent an ABI which showed Right ABI: >1.3 and Left 0.70 (on 05/15/15, Right ABI: 1.12 and Left 0.80). The patient denies any claudication like symptoms, rest pain or ulcers to his feet / toes.  The patient continues a regimen of ASA and dyslipidemia medication.  The patient does not use tobacco.  The patient denies any new health changes since last visit. Medication list reviewed.  1. PAD (peripheral artery disease) ([place]) - Stable Studies reviewed with patient. Asymptomatic  with decrease in left ABI. No intervention indicated at this time.  Patient to follow up in 6 months with ABI and bilateral lower extremity duplex to assess for any worsening PAD. Patient to continue medical optimization with ASA and dyslipidemia medication. Patient to remain abstinent of tobacco use. I have discussed with the patient at length the risk factors for and pathogenesis of atherosclerotic disease and encouraged a healthy diet, regular exercise regimen and blood pressure / glucose control.  The patient was encouraged to call the office in the interim if he experiences any claudication like symptoms, rest pain or ulcers to his feet / toes.  2. Type 2 diabetes mellitus with complication, unspecified long term insulin use status ([place]) - Stable Encouraged good control as its slows the progression of atherosclerotic disease  3. Hyperlipidemia, unspecified hyperlipidemia type - Stable On ASA and statin for medical optimization. Encouraged good control as its slows the progression of atherosclerotic disease  4. Essential hypertension - Stable Encouraged good control as its slows the progression of atherosclerotic disease  Current Outpatient Prescriptions on File Prior to Visit  Medication Sig Dispense Refill  . aspirin 81 MG chewable tablet Chew by mouth daily.    Marland Kitchen b complex vitamins tablet Take 1 tablet by mouth daily.    . cetirizine (ZYRTEC) 10 MG tablet Take 10 mg by mouth daily.    . enalapril (VASOTEC) 20 MG tablet Take 20 mg by mouth daily.    Marland Kitchen ezetimibe-simvastatin (VYTORIN) 10-40 MG per tablet Take 1 tablet by mouth daily.    . folic acid (FOLVITE) 785 MCG tablet Take 400 mcg by mouth daily.    Marland Kitchen gabapentin (NEURONTIN) 300 MG capsule Take 300 mg by mouth 3 (three) times daily.    . hydrochlorothiazide (HYDRODIURIL) 25 MG tablet Take 25 mg by mouth daily.    . insulin detemir (LEVEMIR) 100 UNIT/ML injection Inject into the skin at bedtime.    . insulin lispro (HUMALOG) 100  UNIT/ML injection Inject into the skin 3 (three) times daily before meals.    Marland Kitchen levothyroxine (SYNTHROID, LEVOTHROID) 50 MCG tablet Take 50 mcg by mouth daily before breakfast.    . metFORMIN (GLUCOPHAGE) 1000 MG tablet Take 1,000 mg by mouth 2 (two) times daily with a meal.    . metoprolol (LOPRESSOR) 50 MG tablet Take 50 mg by mouth 2 (two) times daily.    . Multiple Vitamin (MULTIVITAMIN WITH MINERALS) TABS tablet Take 1 tablet by mouth daily.    . pioglitazone (ACTOS) 30 MG tablet Take 30 mg by mouth daily.    . vitamin B-12 (CYANOCOBALAMIN) 1000 MCG tablet Take 1,000 mcg by mouth daily.     No current facility-administered medications on file prior to visit.  There are no Patient Instructions on file for this visit. Return in about 6 months (around 05/24/2016) for ABI and LE Arterial Duplex.   Stanislav Gervase A Rorik Vespa, PA-C

## 2015-12-10 DIAGNOSIS — Z125 Encounter for screening for malignant neoplasm of prostate: Secondary | ICD-10-CM | POA: Diagnosis not present

## 2015-12-10 DIAGNOSIS — E782 Mixed hyperlipidemia: Secondary | ICD-10-CM | POA: Diagnosis not present

## 2015-12-10 DIAGNOSIS — Z0000 Encounter for general adult medical examination without abnormal findings: Secondary | ICD-10-CM | POA: Diagnosis not present

## 2015-12-10 DIAGNOSIS — E1159 Type 2 diabetes mellitus with other circulatory complications: Secondary | ICD-10-CM | POA: Diagnosis not present

## 2015-12-10 DIAGNOSIS — I10 Essential (primary) hypertension: Secondary | ICD-10-CM | POA: Diagnosis not present

## 2015-12-12 DIAGNOSIS — Z0001 Encounter for general adult medical examination with abnormal findings: Secondary | ICD-10-CM | POA: Diagnosis not present

## 2015-12-12 DIAGNOSIS — I2510 Atherosclerotic heart disease of native coronary artery without angina pectoris: Secondary | ICD-10-CM | POA: Diagnosis not present

## 2015-12-12 DIAGNOSIS — M5117 Intervertebral disc disorders with radiculopathy, lumbosacral region: Secondary | ICD-10-CM | POA: Diagnosis not present

## 2015-12-12 DIAGNOSIS — I10 Essential (primary) hypertension: Secondary | ICD-10-CM | POA: Diagnosis not present

## 2015-12-12 DIAGNOSIS — G4733 Obstructive sleep apnea (adult) (pediatric): Secondary | ICD-10-CM | POA: Diagnosis not present

## 2015-12-12 DIAGNOSIS — E1165 Type 2 diabetes mellitus with hyperglycemia: Secondary | ICD-10-CM | POA: Diagnosis not present

## 2015-12-17 DIAGNOSIS — Z23 Encounter for immunization: Secondary | ICD-10-CM | POA: Diagnosis not present

## 2015-12-18 DIAGNOSIS — Z794 Long term (current) use of insulin: Secondary | ICD-10-CM | POA: Diagnosis not present

## 2015-12-18 DIAGNOSIS — E063 Autoimmune thyroiditis: Secondary | ICD-10-CM | POA: Diagnosis not present

## 2015-12-18 DIAGNOSIS — Z8679 Personal history of other diseases of the circulatory system: Secondary | ICD-10-CM | POA: Diagnosis not present

## 2015-12-18 DIAGNOSIS — E1151 Type 2 diabetes mellitus with diabetic peripheral angiopathy without gangrene: Secondary | ICD-10-CM | POA: Diagnosis not present

## 2015-12-18 DIAGNOSIS — E038 Other specified hypothyroidism: Secondary | ICD-10-CM | POA: Diagnosis not present

## 2015-12-18 DIAGNOSIS — E538 Deficiency of other specified B group vitamins: Secondary | ICD-10-CM | POA: Diagnosis not present

## 2016-01-07 DIAGNOSIS — G4733 Obstructive sleep apnea (adult) (pediatric): Secondary | ICD-10-CM | POA: Diagnosis not present

## 2016-01-22 DIAGNOSIS — G4733 Obstructive sleep apnea (adult) (pediatric): Secondary | ICD-10-CM | POA: Diagnosis not present

## 2016-01-22 DIAGNOSIS — R0602 Shortness of breath: Secondary | ICD-10-CM | POA: Diagnosis not present

## 2016-05-12 DIAGNOSIS — E1165 Type 2 diabetes mellitus with hyperglycemia: Secondary | ICD-10-CM | POA: Diagnosis not present

## 2016-05-12 DIAGNOSIS — I10 Essential (primary) hypertension: Secondary | ICD-10-CM | POA: Diagnosis not present

## 2016-05-12 DIAGNOSIS — I70211 Atherosclerosis of native arteries of extremities with intermittent claudication, right leg: Secondary | ICD-10-CM | POA: Diagnosis not present

## 2016-05-24 ENCOUNTER — Encounter (INDEPENDENT_AMBULATORY_CARE_PROVIDER_SITE_OTHER): Payer: Medicare Other

## 2016-05-24 ENCOUNTER — Ambulatory Visit (INDEPENDENT_AMBULATORY_CARE_PROVIDER_SITE_OTHER): Payer: Medicare Other | Admitting: Vascular Surgery

## 2016-05-24 DIAGNOSIS — E1165 Type 2 diabetes mellitus with hyperglycemia: Secondary | ICD-10-CM | POA: Diagnosis not present

## 2016-05-24 DIAGNOSIS — H1010 Acute atopic conjunctivitis, unspecified eye: Secondary | ICD-10-CM | POA: Diagnosis not present

## 2016-05-24 DIAGNOSIS — I10 Essential (primary) hypertension: Secondary | ICD-10-CM | POA: Diagnosis not present

## 2016-05-24 DIAGNOSIS — J309 Allergic rhinitis, unspecified: Secondary | ICD-10-CM | POA: Diagnosis not present

## 2016-05-24 DIAGNOSIS — J069 Acute upper respiratory infection, unspecified: Secondary | ICD-10-CM | POA: Diagnosis not present

## 2016-06-10 DIAGNOSIS — E1151 Type 2 diabetes mellitus with diabetic peripheral angiopathy without gangrene: Secondary | ICD-10-CM | POA: Diagnosis not present

## 2016-06-10 DIAGNOSIS — E538 Deficiency of other specified B group vitamins: Secondary | ICD-10-CM | POA: Diagnosis not present

## 2016-06-10 DIAGNOSIS — E063 Autoimmune thyroiditis: Secondary | ICD-10-CM | POA: Diagnosis not present

## 2016-06-10 DIAGNOSIS — E038 Other specified hypothyroidism: Secondary | ICD-10-CM | POA: Diagnosis not present

## 2016-06-10 DIAGNOSIS — Z794 Long term (current) use of insulin: Secondary | ICD-10-CM | POA: Diagnosis not present

## 2016-06-17 DIAGNOSIS — Z6839 Body mass index (BMI) 39.0-39.9, adult: Secondary | ICD-10-CM | POA: Diagnosis not present

## 2016-06-17 DIAGNOSIS — E1140 Type 2 diabetes mellitus with diabetic neuropathy, unspecified: Secondary | ICD-10-CM | POA: Diagnosis not present

## 2016-06-17 DIAGNOSIS — E1159 Type 2 diabetes mellitus with other circulatory complications: Secondary | ICD-10-CM | POA: Diagnosis not present

## 2016-06-17 DIAGNOSIS — E038 Other specified hypothyroidism: Secondary | ICD-10-CM | POA: Diagnosis not present

## 2016-06-17 DIAGNOSIS — E669 Obesity, unspecified: Secondary | ICD-10-CM | POA: Diagnosis not present

## 2016-06-17 DIAGNOSIS — E063 Autoimmune thyroiditis: Secondary | ICD-10-CM | POA: Diagnosis not present

## 2016-06-17 DIAGNOSIS — E11649 Type 2 diabetes mellitus with hypoglycemia without coma: Secondary | ICD-10-CM | POA: Diagnosis not present

## 2016-06-17 DIAGNOSIS — Z6841 Body Mass Index (BMI) 40.0 and over, adult: Secondary | ICD-10-CM | POA: Diagnosis not present

## 2016-06-17 DIAGNOSIS — E538 Deficiency of other specified B group vitamins: Secondary | ICD-10-CM | POA: Diagnosis not present

## 2016-06-17 DIAGNOSIS — Z8679 Personal history of other diseases of the circulatory system: Secondary | ICD-10-CM | POA: Diagnosis not present

## 2016-06-17 DIAGNOSIS — Z794 Long term (current) use of insulin: Secondary | ICD-10-CM | POA: Diagnosis not present

## 2016-07-02 DIAGNOSIS — M5416 Radiculopathy, lumbar region: Secondary | ICD-10-CM | POA: Diagnosis not present

## 2016-07-02 DIAGNOSIS — M48062 Spinal stenosis, lumbar region with neurogenic claudication: Secondary | ICD-10-CM | POA: Diagnosis not present

## 2016-07-02 DIAGNOSIS — M4726 Other spondylosis with radiculopathy, lumbar region: Secondary | ICD-10-CM | POA: Diagnosis not present

## 2016-07-07 DIAGNOSIS — G4733 Obstructive sleep apnea (adult) (pediatric): Secondary | ICD-10-CM | POA: Diagnosis not present

## 2016-07-19 ENCOUNTER — Ambulatory Visit (INDEPENDENT_AMBULATORY_CARE_PROVIDER_SITE_OTHER): Payer: Medicare Other | Admitting: Vascular Surgery

## 2016-07-19 ENCOUNTER — Ambulatory Visit (INDEPENDENT_AMBULATORY_CARE_PROVIDER_SITE_OTHER): Payer: Medicare Other

## 2016-07-19 ENCOUNTER — Encounter (INDEPENDENT_AMBULATORY_CARE_PROVIDER_SITE_OTHER): Payer: Self-pay | Admitting: Vascular Surgery

## 2016-07-19 VITALS — BP 159/68 | HR 62 | Ht 68.0 in | Wt 255.0 lb

## 2016-07-19 DIAGNOSIS — E118 Type 2 diabetes mellitus with unspecified complications: Secondary | ICD-10-CM

## 2016-07-19 DIAGNOSIS — I739 Peripheral vascular disease, unspecified: Secondary | ICD-10-CM

## 2016-07-19 DIAGNOSIS — E785 Hyperlipidemia, unspecified: Secondary | ICD-10-CM | POA: Diagnosis not present

## 2016-07-19 LAB — VAS US LOWER EXTREMITY ARTERIAL DUPLEX
LATIBDISTDIA: 0 cm/s
LATIBDISTSYS: 32 cm/s
LEFT PERO DIST DIA: 0 cm/s
LEFT PERO DIST SYS: -44 cm/s
LEFT POPLITEAL DIST DYS VEL: 0 cm/s
LEFT SFA PROX DYS VEL: 0 cm/s
LPOPDPSV: 66 cm/s
LPOPPPSV: 92 cm/s
LPOPPROXDYSV: 0 cm/s
LPTIBDISTSYS: -48 cm/s
LSFADISTDYSV: 0 cm/s
LSFAMIDVEL: 0 cm/s
LSFMPSV: -133 cm/s
LSFPPSV: 80 cm/s
Left super femoral dist sys PSV: 516 cm/s
RIGHT ANT DIST TIBAL DIA PSV: 0 cm/s
RIGHT ANT DIST TIBAL SYS PSV: -35 cm/s
RIGHT POPLITEAL PROX EDV: 0 cm/s
RIGHT SUPER FEMORAL MID EDV: 0 cm/s
RIGHT SUPER FEMORAL PROX EDV: 0 cm/s
RPERMIN: 0 m/s
RPOPPPSV: 164 cm/s
RSFDPSV: -170 cm/s
RSFMPSV: -78 cm/s
RSFPPSV: 235 cm/s
RTIBDISTSYS: 64 cm/s
Right peroneal sys PSV: -65 cm/s
left post tibial dist dia: -4 cm/s

## 2016-07-19 NOTE — Progress Notes (Signed)
Subjective:    Patient ID: Allen [name], male    DOB: 1940/04/30, 76 y.o.   MRN: [id number] Chief Complaint  Patient presents with  . Re-evaluation    6 month ultrasound follow up   Patient presents for 6 month PAD follow-up. Patient is without complaint. 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 His lower extremity stays relatively numb. The patient underwent a bit bilateral ABI which was notable for no evidence of significant right lower extremity arterial disease. The left ankle-brachial index suggests moderate left lower extremity arterial disease. The toe brachial indices are normal on the right abnormal on the left the right ankle-brachial index is within normal limits and the left brachial index is stable when compared to the last exam on 11/24/2015. The patient underwent a bilateral lower extremity arterial exam which showed the right lower extremity to have triphasic blood flow distally with a patent stent. The left lower extremity showed triphasic blood flow transitioning to monophasic at the distal SFA transitioning back to biphasic distally. This is relatively stable when compared to the last study patient denies any rest pain or ulcerations of the bilateral lower extremities. The patient denies any fever nausea vomiting.    Review of Systems  Constitutional: Negative.   HENT: Negative.   Eyes: Negative.   Respiratory: Negative.   Cardiovascular: Negative.   Gastrointestinal: Negative.   Endocrine: Negative.   Genitourinary: Negative.   Musculoskeletal: Negative.   Skin: Negative.   Allergic/Immunologic: Negative.   Neurological: Negative.   Hematological: Negative.   Psychiatric/Behavioral: Negative.       Objective:   Physical Exam  Constitutional: He is oriented to person, place, and time. He appears well-developed and well-nourished. No distress.  HENT:  Head: Normocephalic and atraumatic.  Eyes: Conjunctivae are normal. Pupils are equal,  round, and reactive to light.  Neck: Normal range of motion.  Cardiovascular: Normal rate, regular rhythm, normal heart sounds and intact distal pulses.   Pulses:      Radial pulses are 2+ on the right side, and 2+ on the left side.       Dorsalis pedis pulses are 2+ on the right side, and 1+ on the left side.       Posterior tibial pulses are 1+ on the right side, and 1+ on the left side.  Pulmonary/Chest: Effort normal.  Musculoskeletal: Normal range of motion. He exhibits no edema.  Neurological: He is alert and oriented to person, place, and time.  Skin: Skin is warm and dry. He is not diaphoretic.  Psychiatric: He has a normal mood and affect. His behavior is normal. Judgment and thought content normal.  Vitals reviewed.   BP (!) 159/68 (BP Location: Right Arm)   Pulse 62   Ht 5\' 8"  (1.727 m)   Wt 255 lb (115.7 kg)   BMI 38.77 kg/m   Past Medical History:  Diagnosis Date  . Diabetes mellitus without complication ([place])   . Heart disease   . Hypertension     Social History   Social History  . Marital status: Married    Spouse name: N/A  . Number of children: N/A  . Years of education: N/A   Occupational History  . Not on file.   Social History Main Topics  . Smoking status: Former Research scientist (life sciences)  . Smokeless tobacco: Former Systems developer    Quit date: 01/27/1988  . Alcohol use Not on file  . Drug use: Unknown  . Sexual activity: Not on file  Other Topics Concern  . Not on file   Social History Narrative  . No narrative on file    No past surgical history on file.  No family history on file.  Allergies  Allergen Reactions  . Iodinated Diagnostic Agents Anaphylaxis and Rash    X-Ray contrast dyes- anaphylaxis & rash  . Iohexol Hives and Shortness Of Breath    Pt states that he had IV dye for a kidney x-ray years ago and after injection of the dye he had hives all over his body, throat swelling, and SOB.  . Duloxetine Other (See Comments)    Sick to stomach and  chills  . Dye Fdc Red [Red Dye] Hives  . Fluticasone Other (See Comments)    Caused nosebleeds  . Penicillins        Assessment & Plan:  Patient presents for 6 month PAD follow-up. Patient is without complaint. 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 His lower extremity stays relatively numb. The patient underwent a bit bilateral ABI which was notable for no evidence of significant right lower extremity arterial disease. The left ankle-brachial index suggests moderate left lower extremity arterial disease. The toe brachial indices are normal on the right abnormal on the left the right ankle-brachial index is within normal limits and the left brachial index is stable when compared to the last exam on 11/24/2015. The patient underwent a bilateral lower extremity arterial exam which showed the right lower extremity to have triphasic blood flow distally with a patent stent. The left lower extremity showed triphasic blood flow transitioning to monophasic at the distal SFA transitioning back to biphasic distally. This is relatively stable when compared to the last study patient denies any rest pain or ulcerations of the bilateral lower extremities. The patient denies any fever nausea vomiting.   1. PAD (peripheral artery disease) ([place]) - Stable Patient is asymptomatic however does have a serrated severe bilateral neuropathy. ABI and bilateral arterial are stable. Due to the patient's neuropathy, we'll continue to monitor closely as he may need endovascular intervention to the left lower extremity in the future. Patient to follow up in 6 months  - VAS [place] ABI WITH/WO TBI; Future - VAS [place] LOWER EXTREMITY ARTERIAL DUPLEX; Future  2. Type 2 diabetes mellitus with complication, unspecified whether long term insulin use ([facility]) - stable Encouraged good control as its slows the progression of atherosclerotic disease  3. Hyperlipidemia, unspecified hyperlipidemia type -  stable Encouraged good control as its slows the progression of atherosclerotic disease  Current Outpatient Prescriptions on File Prior to Visit  Medication Sig Dispense Refill  . aspirin 81 MG chewable tablet Chew by mouth daily.    Marland Kitchen b complex vitamins tablet Take 1 tablet by mouth daily.    . B-D ULTRA-FINE 33 LANCETS MISC Use 1 Lancet 3 (three) times daily.    . cetirizine (ZYRTEC) 10 MG tablet Take 10 mg by mouth daily.    . enalapril (VASOTEC) 20 MG tablet Take 20 mg by mouth daily.    Marland Kitchen ezetimibe-simvastatin (VYTORIN) 10-40 MG per tablet Take 1 tablet by mouth daily.    . folic acid (FOLVITE) 097 MCG tablet Take 400 mcg by mouth daily.    Marland Kitchen gabapentin (NEURONTIN) 300 MG capsule Take 300 mg by mouth 3 (three) times daily.    . hydrochlorothiazide (HYDRODIURIL) 25 MG tablet Take 25 mg by mouth daily.    . insulin lispro (HUMALOG) 100 UNIT/ML injection Inject into the skin 3 (  three) times daily before meals.    Marland Kitchen levothyroxine (SYNTHROID, LEVOTHROID) 50 MCG tablet Take 50 mcg by mouth daily before breakfast.    . metFORMIN (GLUCOPHAGE) 1000 MG tablet Take 1,000 mg by mouth 2 (two) times daily with a meal.    . metoprolol succinate (TOPROL-XL) 50 MG 24 hr tablet   0  . Multiple Vitamin (MULTIVITAMIN WITH MINERALS) TABS tablet Take 1 tablet by mouth daily.    . pioglitazone (ACTOS) 30 MG tablet Take 30 mg by mouth daily.    . traMADol (ULTRAM) 50 MG tablet as needed.    . vitamin B-12 (CYANOCOBALAMIN) 1000 MCG tablet Take 1,000 mcg by mouth daily.    . insulin detemir (LEVEMIR) 100 UNIT/ML injection Inject into the skin at bedtime.     No current facility-administered medications on file prior to visit.     There are no Patient Instructions on file for this visit. No Follow-up on file.   Yoav Okane A Bailee Thall, PA-C

## 2016-09-13 DIAGNOSIS — E1165 Type 2 diabetes mellitus with hyperglycemia: Secondary | ICD-10-CM | POA: Diagnosis not present

## 2016-09-13 DIAGNOSIS — J309 Allergic rhinitis, unspecified: Secondary | ICD-10-CM | POA: Diagnosis not present

## 2016-09-13 DIAGNOSIS — I10 Essential (primary) hypertension: Secondary | ICD-10-CM | POA: Diagnosis not present

## 2016-09-13 DIAGNOSIS — M5117 Intervertebral disc disorders with radiculopathy, lumbosacral region: Secondary | ICD-10-CM | POA: Diagnosis not present

## 2016-09-13 DIAGNOSIS — E782 Mixed hyperlipidemia: Secondary | ICD-10-CM | POA: Diagnosis not present

## 2016-09-19 IMAGING — CR DG CHEST 2V
1 series · 2 of 2 positions shown · non-contrast
Comparison: 04/12/2007; 04/07/2007

CLINICAL DATA: Cough and shortness of breath since this morning.
Former smoker.

EXAM:
CHEST  2 VIEW

[Series 1: dg chest 2 view · 0.14mm/px · 2 of 2 slices shown]
[im 1/2]
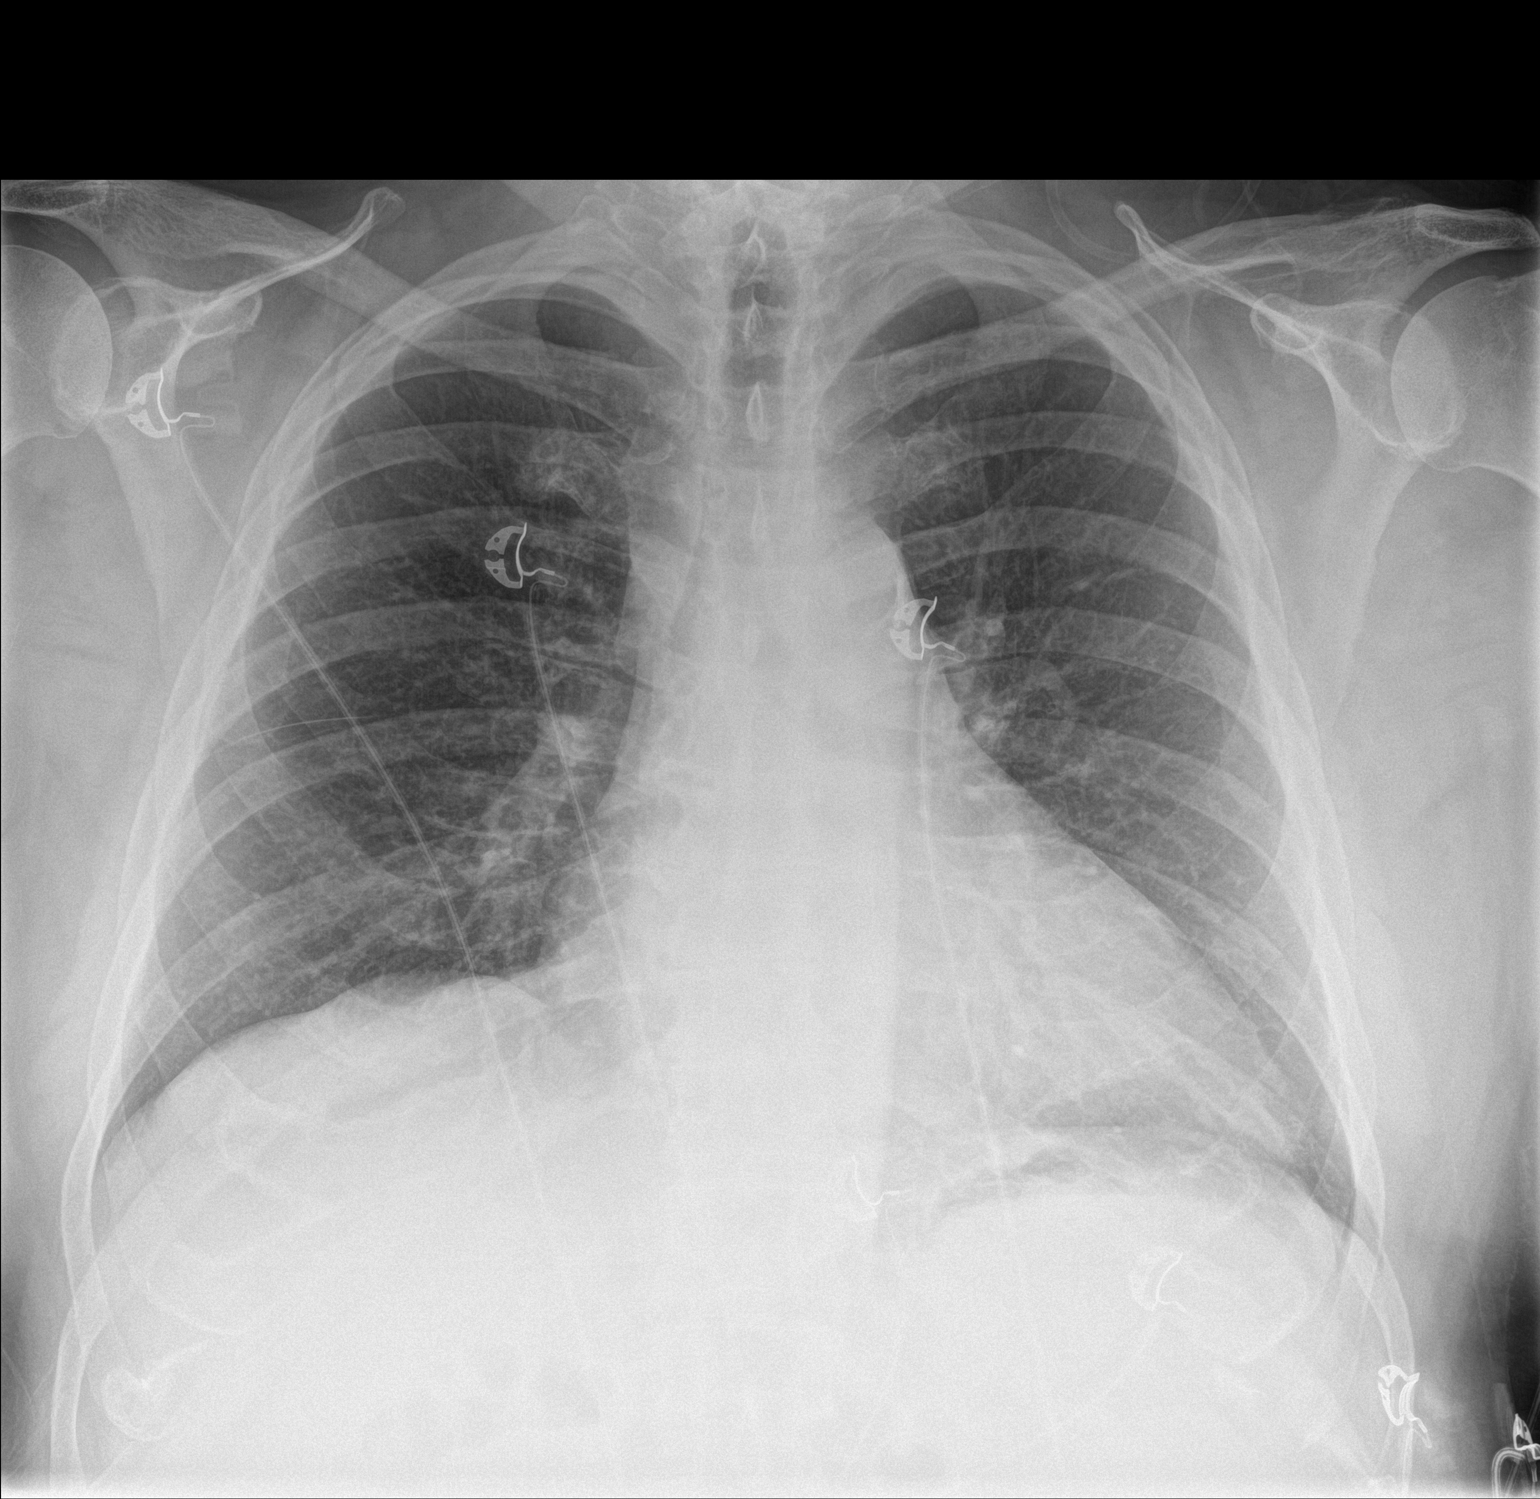
[im 2/2]
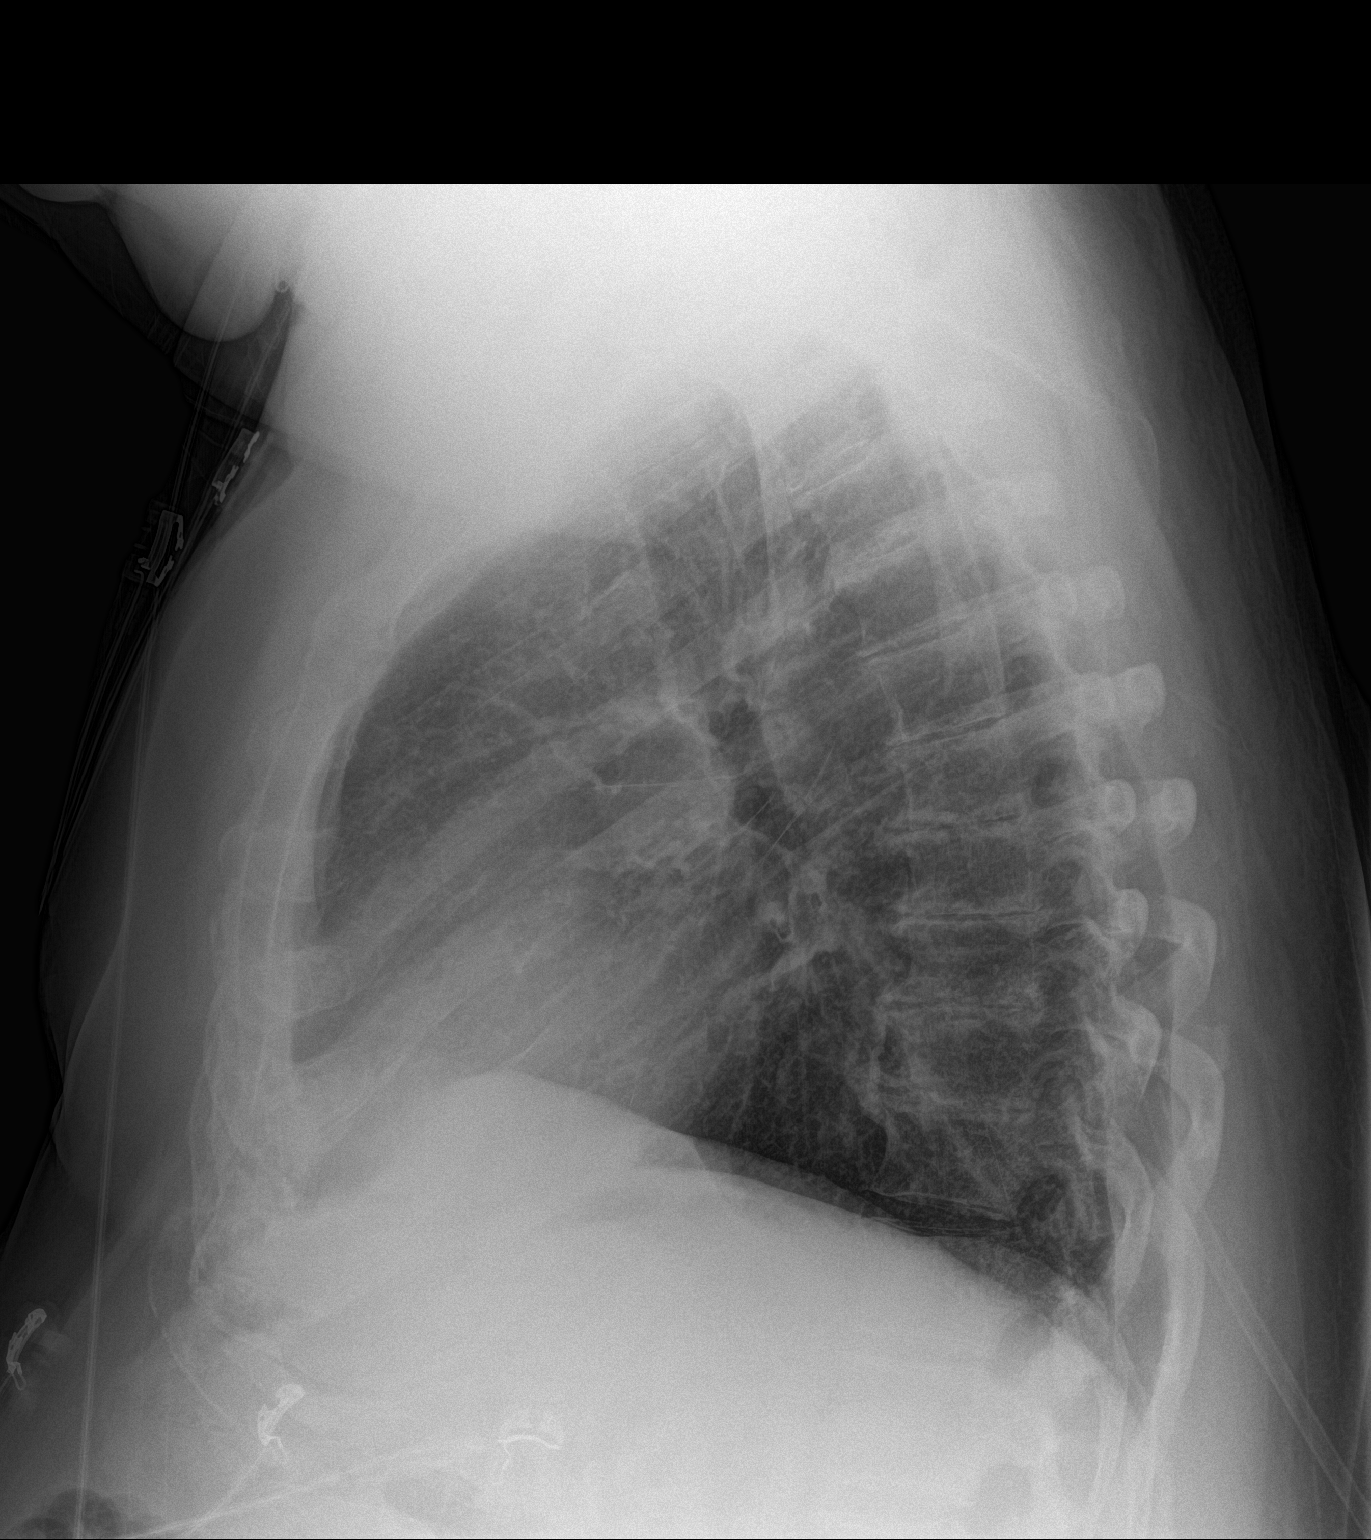

[2 of 2 positions shown; findings below may reference images not displayed]

FINDINGS: Grossly unchanged enlarged cardiac silhouette and mediastinal
contours. Improved inspiratory effort with persistent bibasilar
opacities, likely atelectasis. There is mild elevation / eventration
of the right hemidiaphragm. No new focal airspace opacities. Minimal
pleural parenchymal thickening about the peripheral aspect the right
minor fissure. No pleural effusion or pneumothorax been evidence of
edema. No acute osseous abnormalities. Stigmata of DISH within the
thoracic spine.
IMPRESSION: No acute cardiopulmonary disease.

## 2016-11-30 DIAGNOSIS — Z23 Encounter for immunization: Secondary | ICD-10-CM | POA: Diagnosis not present

## 2016-12-16 DIAGNOSIS — E1159 Type 2 diabetes mellitus with other circulatory complications: Secondary | ICD-10-CM | POA: Diagnosis not present

## 2016-12-16 DIAGNOSIS — Z8679 Personal history of other diseases of the circulatory system: Secondary | ICD-10-CM | POA: Diagnosis not present

## 2016-12-16 DIAGNOSIS — Z6841 Body Mass Index (BMI) 40.0 and over, adult: Secondary | ICD-10-CM | POA: Diagnosis not present

## 2016-12-16 DIAGNOSIS — E11649 Type 2 diabetes mellitus with hypoglycemia without coma: Secondary | ICD-10-CM | POA: Diagnosis not present

## 2016-12-16 DIAGNOSIS — Z794 Long term (current) use of insulin: Secondary | ICD-10-CM | POA: Diagnosis not present

## 2016-12-16 DIAGNOSIS — E038 Other specified hypothyroidism: Secondary | ICD-10-CM | POA: Diagnosis not present

## 2016-12-16 DIAGNOSIS — E063 Autoimmune thyroiditis: Secondary | ICD-10-CM | POA: Diagnosis not present

## 2016-12-16 DIAGNOSIS — E538 Deficiency of other specified B group vitamins: Secondary | ICD-10-CM | POA: Diagnosis not present

## 2016-12-23 DIAGNOSIS — M5136 Other intervertebral disc degeneration, lumbar region: Secondary | ICD-10-CM | POA: Diagnosis not present

## 2016-12-23 DIAGNOSIS — M9903 Segmental and somatic dysfunction of lumbar region: Secondary | ICD-10-CM | POA: Diagnosis not present

## 2016-12-23 DIAGNOSIS — M5416 Radiculopathy, lumbar region: Secondary | ICD-10-CM | POA: Diagnosis not present

## 2016-12-23 DIAGNOSIS — M9905 Segmental and somatic dysfunction of pelvic region: Secondary | ICD-10-CM | POA: Diagnosis not present

## 2016-12-27 ENCOUNTER — Other Ambulatory Visit
Admission: RE | Admit: 2016-12-27 | Discharge: 2016-12-27 | Disposition: A | Discharge status: Home / Self Care | Payer: Medicare Other | Source: Ambulatory Visit | Attending: Nurse Practitioner | Admitting: Nurse Practitioner

## 2016-12-27 DIAGNOSIS — E785 Hyperlipidemia, unspecified: Secondary | ICD-10-CM | POA: Insufficient documentation

## 2016-12-27 LAB — COMPREHENSIVE METABOLIC PANEL
ALBUMIN: 4.2 g/dL (ref 3.5–5.0)
ALK PHOS: 51 U/L (ref 38–126)
ALT: 22 U/L (ref 17–63)
AST: 34 U/L (ref 15–41)
Anion gap: 10 (ref 5–15)
BILIRUBIN TOTAL: 0.9 mg/dL (ref 0.3–1.2)
BUN: 15 mg/dL (ref 6–20)
CALCIUM: 9.3 mg/dL (ref 8.9–10.3)
CO2: 26 mmol/L (ref 22–32)
Chloride: 98 mmol/L — ABNORMAL LOW (ref 101–111)
Creatinine, Ser: 1.22 mg/dL (ref 0.61–1.24)
GFR calc Af Amer: >60 mL/min (ref >60)
GFR, EST NON AFRICAN AMERICAN: 57 mL/min — AB (ref >60)
GLUCOSE: 184 mg/dL — AB (ref 65–99)
Potassium: 4.5 mmol/L (ref 3.5–5.1)
Sodium: 134 mmol/L — ABNORMAL LOW (ref 135–145)
TOTAL PROTEIN: 7.2 g/dL (ref 6.5–8.1)

## 2016-12-27 LAB — LIPID PANEL
CHOLESTEROL: 134 mg/dL (ref 0–200)
HDL: 72 mg/dL (ref >40)
LDL CALC: 44 mg/dL (ref 0–99)
Total CHOL/HDL Ratio: 1.9 RATIO
Triglycerides: 89 mg/dL (ref <150)
VLDL: 18 mg/dL (ref 0–40)

## 2016-12-27 LAB — PSA: Prostatic Specific Antigen: 0.62 ng/mL (ref 0.00–4.00)

## 2016-12-28 DIAGNOSIS — M9905 Segmental and somatic dysfunction of pelvic region: Secondary | ICD-10-CM | POA: Diagnosis not present

## 2016-12-28 DIAGNOSIS — M5136 Other intervertebral disc degeneration, lumbar region: Secondary | ICD-10-CM | POA: Diagnosis not present

## 2016-12-28 DIAGNOSIS — M9903 Segmental and somatic dysfunction of lumbar region: Secondary | ICD-10-CM | POA: Diagnosis not present

## 2016-12-28 DIAGNOSIS — M5416 Radiculopathy, lumbar region: Secondary | ICD-10-CM | POA: Diagnosis not present

## 2016-12-30 DIAGNOSIS — M5416 Radiculopathy, lumbar region: Secondary | ICD-10-CM | POA: Diagnosis not present

## 2016-12-30 DIAGNOSIS — M9903 Segmental and somatic dysfunction of lumbar region: Secondary | ICD-10-CM | POA: Diagnosis not present

## 2016-12-30 DIAGNOSIS — M9905 Segmental and somatic dysfunction of pelvic region: Secondary | ICD-10-CM | POA: Diagnosis not present

## 2016-12-30 DIAGNOSIS — M5136 Other intervertebral disc degeneration, lumbar region: Secondary | ICD-10-CM | POA: Diagnosis not present

## 2016-12-31 DIAGNOSIS — M48062 Spinal stenosis, lumbar region with neurogenic claudication: Secondary | ICD-10-CM | POA: Diagnosis not present

## 2016-12-31 DIAGNOSIS — M5416 Radiculopathy, lumbar region: Secondary | ICD-10-CM | POA: Diagnosis not present

## 2016-12-31 DIAGNOSIS — M4726 Other spondylosis with radiculopathy, lumbar region: Secondary | ICD-10-CM | POA: Diagnosis not present

## 2017-01-03 DIAGNOSIS — M5136 Other intervertebral disc degeneration, lumbar region: Secondary | ICD-10-CM | POA: Diagnosis not present

## 2017-01-03 DIAGNOSIS — M9903 Segmental and somatic dysfunction of lumbar region: Secondary | ICD-10-CM | POA: Diagnosis not present

## 2017-01-03 DIAGNOSIS — M9905 Segmental and somatic dysfunction of pelvic region: Secondary | ICD-10-CM | POA: Diagnosis not present

## 2017-01-03 DIAGNOSIS — M5416 Radiculopathy, lumbar region: Secondary | ICD-10-CM | POA: Diagnosis not present

## 2017-01-05 DIAGNOSIS — M9903 Segmental and somatic dysfunction of lumbar region: Secondary | ICD-10-CM | POA: Diagnosis not present

## 2017-01-05 DIAGNOSIS — M5416 Radiculopathy, lumbar region: Secondary | ICD-10-CM | POA: Diagnosis not present

## 2017-01-05 DIAGNOSIS — M5136 Other intervertebral disc degeneration, lumbar region: Secondary | ICD-10-CM | POA: Diagnosis not present

## 2017-01-05 DIAGNOSIS — M9905 Segmental and somatic dysfunction of pelvic region: Secondary | ICD-10-CM | POA: Diagnosis not present

## 2017-01-11 DIAGNOSIS — M9905 Segmental and somatic dysfunction of pelvic region: Secondary | ICD-10-CM | POA: Diagnosis not present

## 2017-01-11 DIAGNOSIS — M5136 Other intervertebral disc degeneration, lumbar region: Secondary | ICD-10-CM | POA: Diagnosis not present

## 2017-01-11 DIAGNOSIS — M9903 Segmental and somatic dysfunction of lumbar region: Secondary | ICD-10-CM | POA: Diagnosis not present

## 2017-01-11 DIAGNOSIS — M5416 Radiculopathy, lumbar region: Secondary | ICD-10-CM | POA: Diagnosis not present

## 2017-01-13 DIAGNOSIS — M9905 Segmental and somatic dysfunction of pelvic region: Secondary | ICD-10-CM | POA: Diagnosis not present

## 2017-01-13 DIAGNOSIS — M5416 Radiculopathy, lumbar region: Secondary | ICD-10-CM | POA: Diagnosis not present

## 2017-01-13 DIAGNOSIS — M9903 Segmental and somatic dysfunction of lumbar region: Secondary | ICD-10-CM | POA: Diagnosis not present

## 2017-01-13 DIAGNOSIS — M5136 Other intervertebral disc degeneration, lumbar region: Secondary | ICD-10-CM | POA: Diagnosis not present

## 2017-01-14 DIAGNOSIS — I10 Essential (primary) hypertension: Secondary | ICD-10-CM | POA: Diagnosis not present

## 2017-01-14 DIAGNOSIS — E1165 Type 2 diabetes mellitus with hyperglycemia: Secondary | ICD-10-CM | POA: Diagnosis not present

## 2017-01-14 DIAGNOSIS — E782 Mixed hyperlipidemia: Secondary | ICD-10-CM | POA: Diagnosis not present

## 2017-01-14 DIAGNOSIS — R300 Dysuria: Secondary | ICD-10-CM | POA: Diagnosis not present

## 2017-01-14 DIAGNOSIS — M5117 Intervertebral disc disorders with radiculopathy, lumbosacral region: Secondary | ICD-10-CM | POA: Diagnosis not present

## 2017-01-14 DIAGNOSIS — J309 Allergic rhinitis, unspecified: Secondary | ICD-10-CM | POA: Diagnosis not present

## 2017-01-14 DIAGNOSIS — Z0001 Encounter for general adult medical examination with abnormal findings: Secondary | ICD-10-CM | POA: Diagnosis not present

## 2017-01-18 DIAGNOSIS — M5416 Radiculopathy, lumbar region: Secondary | ICD-10-CM | POA: Diagnosis not present

## 2017-01-18 DIAGNOSIS — M9905 Segmental and somatic dysfunction of pelvic region: Secondary | ICD-10-CM | POA: Diagnosis not present

## 2017-01-18 DIAGNOSIS — M5136 Other intervertebral disc degeneration, lumbar region: Secondary | ICD-10-CM | POA: Diagnosis not present

## 2017-01-18 DIAGNOSIS — M9903 Segmental and somatic dysfunction of lumbar region: Secondary | ICD-10-CM | POA: Diagnosis not present

## 2017-01-20 DIAGNOSIS — M5416 Radiculopathy, lumbar region: Secondary | ICD-10-CM | POA: Diagnosis not present

## 2017-01-20 DIAGNOSIS — M9903 Segmental and somatic dysfunction of lumbar region: Secondary | ICD-10-CM | POA: Diagnosis not present

## 2017-01-20 DIAGNOSIS — M9905 Segmental and somatic dysfunction of pelvic region: Secondary | ICD-10-CM | POA: Diagnosis not present

## 2017-01-20 DIAGNOSIS — M5136 Other intervertebral disc degeneration, lumbar region: Secondary | ICD-10-CM | POA: Diagnosis not present

## 2017-01-24 ENCOUNTER — Encounter (INDEPENDENT_AMBULATORY_CARE_PROVIDER_SITE_OTHER): Payer: Medicare Other

## 2017-01-24 ENCOUNTER — Ambulatory Visit (INDEPENDENT_AMBULATORY_CARE_PROVIDER_SITE_OTHER): Payer: Medicare Other | Admitting: Vascular Surgery

## 2017-01-31 ENCOUNTER — Encounter: Payer: Self-pay | Admitting: Internal Medicine

## 2017-01-31 ENCOUNTER — Ambulatory Visit (INDEPENDENT_AMBULATORY_CARE_PROVIDER_SITE_OTHER): Payer: Medicare Other | Admitting: Internal Medicine

## 2017-01-31 VITALS — BP 136/40 | Resp 16 | Ht 68.0 in | Wt 253.0 lb

## 2017-01-31 DIAGNOSIS — I739 Peripheral vascular disease, unspecified: Secondary | ICD-10-CM

## 2017-01-31 DIAGNOSIS — G4730 Sleep apnea, unspecified: Secondary | ICD-10-CM

## 2017-01-31 DIAGNOSIS — R0602 Shortness of breath: Secondary | ICD-10-CM | POA: Diagnosis not present

## 2017-01-31 NOTE — Patient Instructions (Signed)

## 2017-01-31 NOTE — Progress Notes (Signed)
[facility] [place], [place] [address]  Pulmonary Sleep Medicine  Office Visit Note  Patient Name: Allen [name]  [id number]  [id number]  Date of Service: 01/31/2017     Complaints/HPI: He has been doing well no admissions to the hospital. Recently had a download done shows excellent compliant on the CPAP device. No EDS noted no hypersomnia. He states he is sleeping 10 hours daily by choice. He is retired and feels that he can sleep in if he feels like it. No distress noted. No cough noted. Occasional SOB noted mainly with exertion  Current Medication: Outpatient Encounter Medications as of 01/31/2017  Medication Sig Note  . aspirin 81 MG chewable tablet Chew by mouth daily.   Marland Kitchen b complex vitamins tablet Take 1 tablet by mouth daily.   . B-D ULTRA-FINE 33 LANCETS MISC Use 1 Lancet 3 (three) times daily. 11/24/2015: Received from: [facility]  . cetirizine (ZYRTEC) 10 MG tablet Take 10 mg by mouth daily.   . Cholecalciferol (VITAMIN D3) 1000 units CAPS Take by mouth.   . Continuous Blood Gluc Sensor (FREESTYLE LIBRE SENSOR SYSTEM) MISC Use 3 each every 10 (ten) days.   . enalapril (VASOTEC) 20 MG tablet Take 20 mg by mouth daily.   Marland Kitchen ezetimibe-simvastatin (VYTORIN) 10-40 MG per tablet Take 1 tablet by mouth daily.   Marland Kitchen FLUZONE HIGH-DOSE 0.5 ML injection ADM 0.5ML IM UTD   . folic acid (FOLVITE) 578 MCG tablet Take 400 mcg by mouth daily.   Marland Kitchen gabapentin (NEURONTIN) 300 MG capsule Take 300 mg by mouth 3 (three) times daily.   . Garlic 4696 MG CAPS Take by mouth.   Marland Kitchen glucose blood (ONE TOUCH ULTRA TEST) test strip Check blood sugar 5x a day as directed   . hydrochlorothiazide (HYDRODIURIL) 25 MG tablet Take 25 mg by mouth daily.   . insulin detemir (LEVEMIR) 100 UNIT/ML injection Inject into the skin at bedtime.   . Insulin Glargine (LANTUS SOLOSTAR) 100 UNIT/ML Solostar Pen Take 16u daily   . insulin lispro (HUMALOG) 100 UNIT/ML injection Inject  into the skin 3 (three) times daily before meals.   . Insulin Pen Needle (BD PEN NEEDLE NANO U/F) 32G X 4 MM MISC USE AS DIRECTED FOUR TIMES A DAY WITH LEVEMIR FLEXTOUCH AND HUMALOG PEN.  DX CODE E11.29   . Insulin Syringe-Needle U-100 (BD INSULIN SYRINGE U/F) 31G X 5/16" 0.3 ML MISC Use 1 syringe 4x a day as directed   . levothyroxine (SYNTHROID, LEVOTHROID) 50 MCG tablet Take 50 mcg by mouth daily before breakfast.   . metFORMIN (GLUCOPHAGE) 1000 MG tablet Take 1,000 mg by mouth 2 (two) times daily with a meal.   . metoprolol succinate (TOPROL-XL) 50 MG 24 hr tablet  11/24/2015: Received from: External Pharmacy  . Multiple Vitamin (MULTI-VITAMINS) TABS Take by mouth.   . Multiple Vitamin (MULTIVITAMIN WITH MINERALS) TABS tablet Take 1 tablet by mouth daily.   . pioglitazone (ACTOS) 30 MG tablet Take 30 mg by mouth daily.   . traMADol (ULTRAM) 50 MG tablet as needed. 11/24/2015: Received from: [facility]: 1-2 po bid prn  . vitamin B-12 (CYANOCOBALAMIN) 1000 MCG tablet Take 1,000 mcg by mouth daily.   . [DISCONTINUED] Garlic 2952 MG CAPS Take by mouth.    No facility-administered encounter medications on file as of 01/31/2017.     Surgical History: History reviewed. No pertinent surgical history.  Medical History: Past Medical History:  Diagnosis Date  .  Diabetes mellitus without complication ([place])   . Heart disease   . Hypertension     Family History: Family History  Problem Relation Age of Onset  . Diabetes Mother   . Brain cancer Mother   . Throat cancer Father   . Prostate cancer Brother     Social History: Social History   Socioeconomic History  . Marital status: Married    Spouse name: Not on file  . Number of children: Not on file  . Years of education: Not on file  . Highest education level: Not on file  Social Needs  . Financial resource strain: Not on file  . Food insecurity - worry: Not on file  . Food insecurity - inability:  Not on file  . Transportation needs - medical: Not on file  . Transportation needs - non-medical: Not on file  Occupational History  . Not on file  Tobacco Use  . Smoking status: Former Smoker    Types: Cigarettes  . Smokeless tobacco: Never Used  Substance and Sexual Activity  . Alcohol use: No    Frequency: Never  . Drug use: No  . Sexual activity: Not on file  Other Topics Concern  . Not on file  Social History Narrative  . Not on file     ROS  General: (-) fever, (-) chills, (-) night sweats, (-) weakness, (-) changes in appetite. Skin: (-) rashes, (-) itching,. Eyes: (-) visual changes, (-) redness, (-) itching, (-) double or blurred vision. Nose and Sinuses: (-) nasal stuffiness or itchiness, (-) postnasal drip, (-) nosebleeds, (-) sinus trouble. Mouth and Throat: (-) sore throat, (-) hoarseness. Neck: (-) swollen glands, (-) enlarged thyroid, (-) neck pain. Respiratory: (-) cough, (-) bloody sputum, +shortness of breath, (-) wheezing. Cardiovascular: (-) ankle swelling, (-) chest pain. Lymphatic: (-) lymph node enlargement, (-) lymph node tenderness. Neurologic: (-) numbness, (-) tingling,(-) dizziness. Psychiatric: (-) anxiety, (-) depression.  Vital Signs: Blood pressure (!) 136/40, resp. rate 16, height 5' 8" (1.727 m), weight 253 lb (114.8 kg), SpO2 97 %.  Examination: General Appearance: The patient is well-developed, well-nourished, and in no distress. Skin: Gross inspection of skin demonstrates no evidence of abnormality. Head: Patient's head is normocephalic, no gross deformities. Eyes: no gross deformities noted. ENT: ears appear grossly normal. Nasopharynx appears to be normal. Neck: Supple. No thyromegaly. No LAD. Respiratory: Lungs are clear to auscultation with no adventitious sounds. Cardiovascular: Normal S1 and S2 without murmur or rub. Extremities: No cyanosis. pulses are equal. Neurologic: Alert and oriented. No involuntary  movements.  LABS: Recent Results (from the past 2160 hour(s))  Comprehensive metabolic panel     Status: Abnormal   Collection Time: 12/27/16  2:45 PM  Result Value Ref Range   Sodium 134 (L) 135 - 145 mmol/L   Potassium 4.5 3.5 - 5.1 mmol/L   Chloride 98 (L) 101 - 111 mmol/L   CO2 26 22 - 32 mmol/L   Glucose, Bld 184 (H) 65 - 99 mg/dL   BUN 15 6 - 20 mg/dL   Creatinine, Ser 1.22 0.61 - 1.24 mg/dL   Calcium 9.3 8.9 - 10.3 mg/dL   Total Protein 7.2 6.5 - 8.1 g/dL   Albumin 4.2 3.5 - 5.0 g/dL   AST 34 15 - 41 U/L   ALT 22 17 - 63 U/L   Alkaline Phosphatase 51 38 - 126 U/L   Total Bilirubin 0.9 0.3 - 1.2 mg/dL   GFR calc non Af Amer 57 (L) >60  mL/min   GFR calc Af Amer >60 >60 mL/min    Comment: (NOTE) The eGFR has been calculated using the CKD EPI equation. This calculation has not been validated in all clinical situations. eGFR's persistently <60 mL/min signify possible Chronic Kidney Disease.    Anion gap 10 5 - 15  Lipid panel     Status: None   Collection Time: 12/27/16  2:45 PM  Result Value Ref Range   Cholesterol 134 0 - 200 mg/dL   Triglycerides 89 <150 mg/dL   HDL 72 >40 mg/dL   Total CHOL/HDL Ratio 1.9 RATIO   VLDL 18 0 - 40 mg/dL   LDL Cholesterol 44 0 - 99 mg/dL    Comment:        Total Cholesterol/HDL:CHD Risk Coronary Heart Disease Risk Table                     Men   Women  1/2 Average Risk   3.4   3.3  Average Risk       5.0   4.4  2 X Average Risk   9.6   7.1  3 X Average Risk  23.4   11.0        Use the calculated Patient Ratio above and the CHD Risk Table to determine the patient's CHD Risk.        ATP III CLASSIFICATION (LDL):  <100     mg/dL   Optimal  100-129  mg/dL   Near or Above                    Optimal  130-159  mg/dL   Borderline  160-189  mg/dL   High  >190     mg/dL   Very High   PSA     Status: None   Collection Time: 12/27/16  2:45 PM  Result Value Ref Range   Prostatic Specific Antigen 0.62 0.00 - 4.00 ng/mL    Comment:  (NOTE) While PSA levels of <=4.0 ng/ml are reported as reference range, some men with levels below 4.0 ng/ml can have prostate cancer and many men with PSA above 4.0 ng/ml do not have prostate cancer.  Other tests such as free PSA, age specific reference ranges, PSA velocity and PSA doubling time may be helpful especially in men less than 71 years old. Performed at [facility], [place] [address] [place], [place] [address]     Radiology: No results found.  No results found.  No results found.    Assessment and Plan: Patient Active Problem List   Diagnosis Date Noted  . PAD (peripheral artery disease) ([place]) 11/24/2015  . Type 2 diabetes mellitus with complication ([place]) 19/14/7829  . Hyperlipidemia 11/24/2015  . Essential hypertension 11/24/2015  . PVD (peripheral vascular disease) ([place]) 10/29/2015    1. OSA Doing well with his CPAP Last download shows compliance of 100% Currently on a pressure of 11CWP  2. SOB Right now he is at baseline Will monitor   3. PVD Will follow with vascular  4. Obesity Again discussed with him importance of weight loss   General Counseling: I have discussed the findings of the evaluation and examination with Effie Shy.  I have also discussed any further diagnostic evaluation thatmay be needed or ordered today. Devonte verbalizes understanding of the findings of todays visit. We also reviewed his medications today and discussed drug interactions and side effects including but not limited excessive drowsiness and altered mental states.  We also discussed that there is always a risk not just to him but also people around him. he has been encouraged to call the office with any questions or concerns that should arise related to todays visit.    Time spent: 29mn  I have personally obtained a history, examined the patient, evaluated laboratory and imaging results, formulated the assessment and plan and placed orders.    SAllyne Gee MD  [facility]ulmonary [facility] Sleep medicine

## 2017-02-02 DIAGNOSIS — M9903 Segmental and somatic dysfunction of lumbar region: Secondary | ICD-10-CM | POA: Diagnosis not present

## 2017-02-02 DIAGNOSIS — M5416 Radiculopathy, lumbar region: Secondary | ICD-10-CM | POA: Diagnosis not present

## 2017-02-02 DIAGNOSIS — M5136 Other intervertebral disc degeneration, lumbar region: Secondary | ICD-10-CM | POA: Diagnosis not present

## 2017-02-02 DIAGNOSIS — M9905 Segmental and somatic dysfunction of pelvic region: Secondary | ICD-10-CM | POA: Diagnosis not present

## 2017-02-09 ENCOUNTER — Encounter (INDEPENDENT_AMBULATORY_CARE_PROVIDER_SITE_OTHER): Payer: Self-pay | Admitting: Vascular Surgery

## 2017-02-09 ENCOUNTER — Ambulatory Visit (INDEPENDENT_AMBULATORY_CARE_PROVIDER_SITE_OTHER): Payer: Medicare Other

## 2017-02-09 ENCOUNTER — Ambulatory Visit (INDEPENDENT_AMBULATORY_CARE_PROVIDER_SITE_OTHER): Payer: Medicare Other | Admitting: Vascular Surgery

## 2017-02-09 VITALS — BP 140/63 | HR 59 | Resp 17 | Ht 68.0 in | Wt 249.0 lb

## 2017-02-09 DIAGNOSIS — I739 Peripheral vascular disease, unspecified: Secondary | ICD-10-CM

## 2017-02-09 DIAGNOSIS — E785 Hyperlipidemia, unspecified: Secondary | ICD-10-CM

## 2017-02-09 DIAGNOSIS — E118 Type 2 diabetes mellitus with unspecified complications: Secondary | ICD-10-CM | POA: Diagnosis not present

## 2017-02-09 NOTE — Progress Notes (Signed)
Subjective:    Patient ID: Allen [name], male    DOB: [birth year], 76 y.o.   MRN: [id number] Chief Complaint  Patient presents with  . Follow-up    6 month ABI and arterial duplex   Patient presents for a six-month peripheral artery disease follow-up.  The patient presents today without complaint.  The patient denies any claudication-like symptoms, rest pain or ulceration to the bilateral lower extremity.  The patient underwent a bilateral ABI which was notable for unreliable right ankle-brachial indices due to medial calcification.  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  Bilateral lower extremity arterial duplex was notable for right lower extremity: Patent stent with biphasic blood flow distally.  Left lower extremity: Biphasic transitioning to monophasic at the popliteal artery.   Review of Systems  Constitutional: Negative.   HENT: Negative.   Eyes: Negative.   Respiratory: Negative.   Cardiovascular: Negative.   Gastrointestinal: Negative.   Endocrine: Negative.   Genitourinary: Negative.   Musculoskeletal: Negative.   Skin: Negative.   Allergic/Immunologic: Negative.   Neurological: Negative.   Hematological: Negative.   Psychiatric/Behavioral: Negative.       Objective:   Physical Exam  Constitutional: He is oriented to person, place, and time. He appears well-developed and well-nourished. No distress.  HENT:  Head: Normocephalic and atraumatic.  Eyes: Conjunctivae are normal. Pupils are equal, round, and reactive to light.  Neck: Normal range of motion.  Cardiovascular: Normal rate, regular rhythm, normal heart sounds and intact distal pulses.  Pulses:      Radial pulses are 2+ on the right side, and 2+ on the left side.       Dorsalis pedis pulses are 1+ on the right side.  Hard to palpate left pedal pulses however his left foot is warm.  Pulmonary/Chest: Effort normal and breath sounds normal.  Musculoskeletal:  Normal range of motion. He exhibits edema (Mild nonpitting edema noted bilaterally).  Neurological: He is alert and oriented to person, place, and time.  Skin: Skin is warm and dry. He is not diaphoretic.  Psychiatric: He has a normal mood and affect. His behavior is normal. Judgment and thought content normal.  Vitals reviewed.  BP 140/63 (BP Location: Right Arm, Patient Position: Sitting)   Pulse (!) 59   Resp 17   Ht 5\' 8"  (1.727 m)   Wt 249 lb (112.9 kg)   BMI 37.86 kg/m   Past Medical History:  Diagnosis Date  . Diabetes mellitus without complication ([place])   . Heart disease   . Hypertension    Social History   Socioeconomic History  . Marital status: Married    Spouse name: Not on file  . Number of children: Not on file  . Years of education: Not on file  . Highest education level: Not on file  Social Needs  . Financial resource strain: Not on file  . Food insecurity - worry: Not on file  . Food insecurity - inability: Not on file  . Transportation needs - medical: Not on file  . Transportation needs - non-medical: Not on file  Occupational History  . Not on file  Tobacco Use  . Smoking status: Former Smoker    Types: Cigarettes  . Smokeless tobacco: Never Used  Substance and Sexual Activity  . Alcohol use: No    Frequency: Never  . Drug use: No  . Sexual activity: Not on file  Other Topics Concern  . Not on file  Social  History Narrative  . Not on file   History reviewed. No pertinent surgical history.  Family History  Problem Relation Age of Onset  . Diabetes Mother   . Brain cancer Mother   . Throat cancer Father   . Prostate cancer Brother     Allergies  Allergen Reactions  . Iodinated Diagnostic Agents Anaphylaxis and Rash    X-Ray contrast dyes- anaphylaxis & rash  . Iohexol Hives and Shortness Of Breath    Pt states that he had IV dye for a kidney x-ray years ago and after injection of the dye he had hives all over his body, throat  swelling, and SOB.  . Duloxetine Other (See Comments)    Sick to stomach and chills  . Dye Fdc Red [Red Dye] Hives  . Fluticasone Other (See Comments)    Caused nosebleeds  . Penicillins       Assessment & Plan:  Patient presents for a six-month peripheral artery disease follow-up.  The patient presents today without complaint.  The patient denies any claudication-like symptoms, rest pain or ulceration to the bilateral lower extremity.  The patient underwent a bilateral ABI which was notable for unreliable right ankle-brachial indices due to medial calcification.  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  Bilateral lower extremity arterial duplex was notable for right lower extremity: Patent stent with biphasic blood flow distally.  Left lower extremity: Biphasic transitioning to monophasic at the popliteal artery.  1. PVD (peripheral vascular disease) ([place]) Studies reviewed with patient. Asymptomatic Physical exam is stable ABI and arterial duplex is stable No indication for intervention at this time Patient to follow up in six months with ABI and arterial duplex. Patient to remain abstinent of tobacco use. I have discussed with the patient at length the risk factors for and pathogenesis of atherosclerotic disease and encouraged a healthy diet, regular exercise regimen and blood pressure / glucose control.  The patient was encouraged to call the office in the interim if he experiences any claudication like symptoms, rest pain or ulcers to his feet / toes.  - VAS [place] ABI WITH/WO TBI; Future - VAS [place] LOWER EXTREMITY ARTERIAL DUPLEX; Future  2. Type 2 diabetes mellitus with complication, unspecified whether long term insulin use (HCC) - Stable Encouraged good control as its slows the progression of atherosclerotic disease  3. Hyperlipidemia, unspecified hyperlipidemia type - Stable Encouraged good control as its slows the progression of  atherosclerotic disease  Current Outpatient Medications on File Prior to Visit  Medication Sig Dispense Refill  . aspirin 81 MG chewable tablet Chew by mouth daily.    Marland Kitchen b complex vitamins tablet Take 1 tablet by mouth daily.    . B-D ULTRA-FINE 33 LANCETS MISC Use 1 Lancet 3 (three) times daily.    . cetirizine (ZYRTEC) 10 MG tablet Take 10 mg by mouth daily.    . Cholecalciferol (VITAMIN D3) 1000 units CAPS Take by mouth.    . Continuous Blood Gluc Sensor (FREESTYLE LIBRE SENSOR SYSTEM) MISC Use 3 each every 10 (ten) days.    . enalapril (VASOTEC) 20 MG tablet Take 20 mg by mouth daily.    Marland Kitchen ezetimibe-simvastatin (VYTORIN) 10-40 MG per tablet Take 1 tablet by mouth daily.    Marland Kitchen FLUZONE HIGH-DOSE 0.5 ML injection ADM 0.0QQ IM UTD  0  . folic acid (FOLVITE) 761 MCG tablet Take 400 mcg by mouth daily.    Marland Kitchen gabapentin (NEURONTIN) 300 MG capsule Take 300 mg  by mouth 3 (three) times daily.    . Garlic 7412 MG CAPS Take by mouth.    Marland Kitchen glucose blood (ONE TOUCH ULTRA TEST) test strip Check blood sugar 5x a day as directed    . hydrochlorothiazide (HYDRODIURIL) 25 MG tablet Take 25 mg by mouth daily.    . insulin detemir (LEVEMIR) 100 UNIT/ML injection Inject into the skin at bedtime.    . Insulin Glargine (LANTUS SOLOSTAR) 100 UNIT/ML Solostar Pen Take 16u daily    . insulin lispro (HUMALOG) 100 UNIT/ML injection Inject into the skin 3 (three) times daily before meals.    . Insulin Pen Needle (BD PEN NEEDLE NANO U/F) 32G X 4 MM MISC USE AS DIRECTED FOUR TIMES A DAY WITH LEVEMIR FLEXTOUCH AND HUMALOG PEN.  DX CODE E11.29    . Insulin Syringe-Needle U-100 (BD INSULIN SYRINGE U/F) 31G X 5/16" 0.3 ML MISC Use 1 syringe 4x a day as directed    . levothyroxine (SYNTHROID, LEVOTHROID) 50 MCG tablet Take 50 mcg by mouth daily before breakfast.    . metFORMIN (GLUCOPHAGE) 1000 MG tablet Take 1,000 mg by mouth 2 (two) times daily with a meal.    . metoprolol succinate (TOPROL-XL) 50 MG 24 hr tablet   0  .  Multiple Vitamin (MULTI-VITAMINS) TABS Take by mouth.    . Multiple Vitamin (MULTIVITAMIN WITH MINERALS) TABS tablet Take 1 tablet by mouth daily.    . pioglitazone (ACTOS) 30 MG tablet Take 30 mg by mouth daily.    . traMADol (ULTRAM) 50 MG tablet as needed.    . vitamin B-12 (CYANOCOBALAMIN) 1000 MCG tablet Take 1,000 mcg by mouth daily.     No current facility-administered medications on file prior to visit.    There are no Patient Instructions on file for this visit. No Follow-up on file.  Ahmet Schank A Lindzie Boxx, PA-C

## 2017-02-10 DIAGNOSIS — M9905 Segmental and somatic dysfunction of pelvic region: Secondary | ICD-10-CM | POA: Diagnosis not present

## 2017-02-10 DIAGNOSIS — M9903 Segmental and somatic dysfunction of lumbar region: Secondary | ICD-10-CM | POA: Diagnosis not present

## 2017-02-10 DIAGNOSIS — M5136 Other intervertebral disc degeneration, lumbar region: Secondary | ICD-10-CM | POA: Diagnosis not present

## 2017-02-10 DIAGNOSIS — M5416 Radiculopathy, lumbar region: Secondary | ICD-10-CM | POA: Diagnosis not present

## 2017-02-11 DIAGNOSIS — E038 Other specified hypothyroidism: Secondary | ICD-10-CM | POA: Diagnosis not present

## 2017-02-11 DIAGNOSIS — E11649 Type 2 diabetes mellitus with hypoglycemia without coma: Secondary | ICD-10-CM | POA: Diagnosis not present

## 2017-02-11 DIAGNOSIS — E1159 Type 2 diabetes mellitus with other circulatory complications: Secondary | ICD-10-CM | POA: Diagnosis not present

## 2017-02-11 DIAGNOSIS — Z8679 Personal history of other diseases of the circulatory system: Secondary | ICD-10-CM | POA: Diagnosis not present

## 2017-02-11 DIAGNOSIS — Z6841 Body Mass Index (BMI) 40.0 and over, adult: Secondary | ICD-10-CM | POA: Diagnosis not present

## 2017-02-11 DIAGNOSIS — E538 Deficiency of other specified B group vitamins: Secondary | ICD-10-CM | POA: Diagnosis not present

## 2017-02-11 DIAGNOSIS — Z794 Long term (current) use of insulin: Secondary | ICD-10-CM | POA: Diagnosis not present

## 2017-02-11 DIAGNOSIS — E063 Autoimmune thyroiditis: Secondary | ICD-10-CM | POA: Diagnosis not present

## 2017-02-16 DIAGNOSIS — M5136 Other intervertebral disc degeneration, lumbar region: Secondary | ICD-10-CM | POA: Diagnosis not present

## 2017-02-16 DIAGNOSIS — M5416 Radiculopathy, lumbar region: Secondary | ICD-10-CM | POA: Diagnosis not present

## 2017-02-16 DIAGNOSIS — M9905 Segmental and somatic dysfunction of pelvic region: Secondary | ICD-10-CM | POA: Diagnosis not present

## 2017-02-16 DIAGNOSIS — M9903 Segmental and somatic dysfunction of lumbar region: Secondary | ICD-10-CM | POA: Diagnosis not present

## 2017-02-18 DIAGNOSIS — E063 Autoimmune thyroiditis: Secondary | ICD-10-CM | POA: Diagnosis not present

## 2017-02-18 DIAGNOSIS — Z8679 Personal history of other diseases of the circulatory system: Secondary | ICD-10-CM | POA: Diagnosis not present

## 2017-02-18 DIAGNOSIS — E038 Other specified hypothyroidism: Secondary | ICD-10-CM | POA: Diagnosis not present

## 2017-02-18 DIAGNOSIS — E538 Deficiency of other specified B group vitamins: Secondary | ICD-10-CM | POA: Diagnosis not present

## 2017-02-18 DIAGNOSIS — E1159 Type 2 diabetes mellitus with other circulatory complications: Secondary | ICD-10-CM | POA: Diagnosis not present

## 2017-02-18 DIAGNOSIS — Z6841 Body Mass Index (BMI) 40.0 and over, adult: Secondary | ICD-10-CM | POA: Diagnosis not present

## 2017-03-02 DIAGNOSIS — M5416 Radiculopathy, lumbar region: Secondary | ICD-10-CM | POA: Diagnosis not present

## 2017-03-02 DIAGNOSIS — M9905 Segmental and somatic dysfunction of pelvic region: Secondary | ICD-10-CM | POA: Diagnosis not present

## 2017-03-02 DIAGNOSIS — M5136 Other intervertebral disc degeneration, lumbar region: Secondary | ICD-10-CM | POA: Diagnosis not present

## 2017-03-02 DIAGNOSIS — M9903 Segmental and somatic dysfunction of lumbar region: Secondary | ICD-10-CM | POA: Diagnosis not present

## 2017-03-04 DIAGNOSIS — E063 Autoimmune thyroiditis: Secondary | ICD-10-CM | POA: Diagnosis not present

## 2017-03-04 DIAGNOSIS — E038 Other specified hypothyroidism: Secondary | ICD-10-CM | POA: Diagnosis not present

## 2017-03-04 DIAGNOSIS — Z6841 Body Mass Index (BMI) 40.0 and over, adult: Secondary | ICD-10-CM | POA: Diagnosis not present

## 2017-03-04 DIAGNOSIS — E538 Deficiency of other specified B group vitamins: Secondary | ICD-10-CM | POA: Diagnosis not present

## 2017-03-04 DIAGNOSIS — Z794 Long term (current) use of insulin: Secondary | ICD-10-CM | POA: Diagnosis not present

## 2017-03-04 DIAGNOSIS — E1151 Type 2 diabetes mellitus with diabetic peripheral angiopathy without gangrene: Secondary | ICD-10-CM | POA: Diagnosis not present

## 2017-03-04 DIAGNOSIS — E1159 Type 2 diabetes mellitus with other circulatory complications: Secondary | ICD-10-CM | POA: Diagnosis not present

## 2017-03-04 DIAGNOSIS — Z8679 Personal history of other diseases of the circulatory system: Secondary | ICD-10-CM | POA: Diagnosis not present

## 2017-03-09 ENCOUNTER — Ambulatory Visit (INDEPENDENT_AMBULATORY_CARE_PROVIDER_SITE_OTHER): Payer: Medicare Other

## 2017-03-09 DIAGNOSIS — G4733 Obstructive sleep apnea (adult) (pediatric): Secondary | ICD-10-CM | POA: Diagnosis not present

## 2017-03-16 NOTE — Progress Notes (Signed)
95 percentile pressure 11.0   95th percentile leak 37.2   apnea index 1.1 /hr  apnea-hypopnea index  3.3 /hr   total days used  >4 hr 174 days  total days used <4 hr 0  days  Total compliance 99 percent

## 2017-03-23 DIAGNOSIS — M5416 Radiculopathy, lumbar region: Secondary | ICD-10-CM | POA: Diagnosis not present

## 2017-03-23 DIAGNOSIS — M5136 Other intervertebral disc degeneration, lumbar region: Secondary | ICD-10-CM | POA: Diagnosis not present

## 2017-03-23 DIAGNOSIS — M9903 Segmental and somatic dysfunction of lumbar region: Secondary | ICD-10-CM | POA: Diagnosis not present

## 2017-03-23 DIAGNOSIS — M9905 Segmental and somatic dysfunction of pelvic region: Secondary | ICD-10-CM | POA: Diagnosis not present

## 2017-03-31 DIAGNOSIS — E1159 Type 2 diabetes mellitus with other circulatory complications: Secondary | ICD-10-CM | POA: Diagnosis not present

## 2017-03-31 DIAGNOSIS — E538 Deficiency of other specified B group vitamins: Secondary | ICD-10-CM | POA: Diagnosis not present

## 2017-03-31 DIAGNOSIS — E038 Other specified hypothyroidism: Secondary | ICD-10-CM | POA: Diagnosis not present

## 2017-03-31 DIAGNOSIS — E063 Autoimmune thyroiditis: Secondary | ICD-10-CM | POA: Diagnosis not present

## 2017-04-08 DIAGNOSIS — Z6841 Body Mass Index (BMI) 40.0 and over, adult: Secondary | ICD-10-CM | POA: Diagnosis not present

## 2017-04-08 DIAGNOSIS — E038 Other specified hypothyroidism: Secondary | ICD-10-CM | POA: Diagnosis not present

## 2017-04-08 DIAGNOSIS — Z8679 Personal history of other diseases of the circulatory system: Secondary | ICD-10-CM | POA: Diagnosis not present

## 2017-04-08 DIAGNOSIS — E1159 Type 2 diabetes mellitus with other circulatory complications: Secondary | ICD-10-CM | POA: Diagnosis not present

## 2017-04-08 DIAGNOSIS — E063 Autoimmune thyroiditis: Secondary | ICD-10-CM | POA: Diagnosis not present

## 2017-04-08 DIAGNOSIS — E538 Deficiency of other specified B group vitamins: Secondary | ICD-10-CM | POA: Diagnosis not present

## 2017-04-08 DIAGNOSIS — E1151 Type 2 diabetes mellitus with diabetic peripheral angiopathy without gangrene: Secondary | ICD-10-CM | POA: Diagnosis not present

## 2017-04-08 DIAGNOSIS — Z794 Long term (current) use of insulin: Secondary | ICD-10-CM | POA: Diagnosis not present

## 2017-06-08 DIAGNOSIS — E1159 Type 2 diabetes mellitus with other circulatory complications: Secondary | ICD-10-CM | POA: Diagnosis not present

## 2017-06-17 DIAGNOSIS — E063 Autoimmune thyroiditis: Secondary | ICD-10-CM | POA: Diagnosis not present

## 2017-06-17 DIAGNOSIS — E538 Deficiency of other specified B group vitamins: Secondary | ICD-10-CM | POA: Diagnosis not present

## 2017-06-17 DIAGNOSIS — Z8679 Personal history of other diseases of the circulatory system: Secondary | ICD-10-CM | POA: Diagnosis not present

## 2017-06-17 DIAGNOSIS — E038 Other specified hypothyroidism: Secondary | ICD-10-CM | POA: Diagnosis not present

## 2017-06-17 DIAGNOSIS — E1159 Type 2 diabetes mellitus with other circulatory complications: Secondary | ICD-10-CM | POA: Diagnosis not present

## 2017-06-17 DIAGNOSIS — E8881 Metabolic syndrome: Secondary | ICD-10-CM | POA: Diagnosis not present

## 2017-06-17 DIAGNOSIS — E1151 Type 2 diabetes mellitus with diabetic peripheral angiopathy without gangrene: Secondary | ICD-10-CM | POA: Diagnosis not present

## 2017-06-17 DIAGNOSIS — Z794 Long term (current) use of insulin: Secondary | ICD-10-CM | POA: Diagnosis not present

## 2017-07-01 DIAGNOSIS — M5416 Radiculopathy, lumbar region: Secondary | ICD-10-CM | POA: Diagnosis not present

## 2017-07-01 DIAGNOSIS — M5136 Other intervertebral disc degeneration, lumbar region: Secondary | ICD-10-CM | POA: Diagnosis not present

## 2017-07-14 ENCOUNTER — Ambulatory Visit (INDEPENDENT_AMBULATORY_CARE_PROVIDER_SITE_OTHER): Payer: Medicare Other | Admitting: Nurse Practitioner

## 2017-07-14 ENCOUNTER — Encounter: Payer: Self-pay | Admitting: Nurse Practitioner

## 2017-07-14 VITALS — BP 133/60 | HR 58 | Resp 16 | Ht 68.0 in | Wt 217.4 lb

## 2017-07-14 DIAGNOSIS — I739 Peripheral vascular disease, unspecified: Secondary | ICD-10-CM

## 2017-07-14 DIAGNOSIS — I10 Essential (primary) hypertension: Secondary | ICD-10-CM

## 2017-07-14 DIAGNOSIS — E118 Type 2 diabetes mellitus with unspecified complications: Secondary | ICD-10-CM | POA: Diagnosis not present

## 2017-07-14 DIAGNOSIS — E785 Hyperlipidemia, unspecified: Secondary | ICD-10-CM

## 2017-07-14 MED ORDER — "INSULIN SYRINGE-NEEDLE U-100 31G X 5/16"" 0.3 ML MISC": 3 refills | Status: DC

## 2017-07-14 NOTE — Progress Notes (Signed)
[facility] [place], [place] [address]  Internal MEDICINE  Office Visit Note  Patient Name: Allen [name]  [id number]  [id number]  Date of Service: 07/31/2017   Pt is here for routine follow up.    Chief Complaint  Patient presents with  . Diabetes  . Hypertension    The patient's diabetes and hypothyroid are managed by endocrinology. Both are well controlled. Blood pressure is well managed. He has no concerns or complaints to bring up.      Current Medication: Outpatient Encounter Medications as of 07/14/2017  Medication Sig Note  . insulin lispro (HUMALOG) 100 UNIT/ML injection Inject into the skin 3 (three) times daily before meals.   Marland Kitchen aspirin 81 MG chewable tablet Chew by mouth daily.   Marland Kitchen b complex vitamins tablet Take 1 tablet by mouth daily.   . B-D ULTRA-FINE 33 LANCETS MISC Use 1 Lancet 3 (three) times daily. 11/24/2015: Received from: [place]  . cetirizine (ZYRTEC) 10 MG tablet Take 10 mg by mouth daily.   . Cholecalciferol (VITAMIN D3) 1000 units CAPS Take by mouth.   . Continuous Blood Gluc Sensor (FREESTYLE LIBRE SENSOR SYSTEM) MISC Use 3 each every 10 (ten) days.   . enalapril (VASOTEC) 20 MG tablet Take 20 mg by mouth daily.   Marland Kitchen ezetimibe-simvastatin (VYTORIN) 10-40 MG per tablet Take 1 tablet by mouth daily.   Marland Kitchen FLUZONE HIGH-DOSE 0.5 ML injection ADM 0.5ML IM UTD   . folic acid (FOLVITE) 427 MCG tablet Take 400 mcg by mouth daily.   Marland Kitchen gabapentin (NEURONTIN) 300 MG capsule Take 300 mg by mouth 3 (three) times daily.   . Garlic 0623 MG CAPS Take by mouth.   Marland Kitchen glucose blood (ONE TOUCH ULTRA TEST) test strip Check blood sugar 5x a day as directed   . hydrochlorothiazide (HYDRODIURIL) 25 MG tablet Take 12.5 mg by mouth daily.    . insulin detemir (LEVEMIR) 100 UNIT/ML injection Inject into the skin at bedtime.   . Insulin Glargine (LANTUS SOLOSTAR) 100 UNIT/ML Solostar Pen Take 16u daily   . Insulin Pen Needle (BD PEN  NEEDLE NANO U/F) 32G X 4 MM MISC USE AS DIRECTED FOUR TIMES A DAY WITH LEVEMIR FLEXTOUCH AND HUMALOG PEN.  DX CODE E11.29   . Insulin Syringe-Needle U-100 (BD INSULIN SYRINGE U/F) 31G X 5/16" 0.3 ML MISC Inject insulin [place] TID as indicated per sliding scale instuctions.   Marland Kitchen JARDIANCE 25 MG TABS tablet    . levothyroxine (SYNTHROID, LEVOTHROID) 50 MCG tablet Take 50 mcg by mouth daily before breakfast.   . metFORMIN (GLUCOPHAGE) 1000 MG tablet Take 1,000 mg by mouth 2 (two) times daily with a meal.   . metoprolol succinate (TOPROL-XL) 50 MG 24 hr tablet  11/24/2015: Received from: External Pharmacy  . Multiple Vitamin (MULTI-VITAMINS) TABS Take by mouth.   . Multiple Vitamin (MULTIVITAMIN WITH MINERALS) TABS tablet Take 1 tablet by mouth daily.   . pioglitazone (ACTOS) 30 MG tablet Take 30 mg by mouth daily.    . traMADol (ULTRAM) 50 MG tablet as needed. 11/24/2015: Received from: [place]: 1-2 po bid prn  . vitamin B-12 (CYANOCOBALAMIN) 1000 MCG tablet Take 1,000 mcg by mouth daily.   . [DISCONTINUED] Insulin Syringe-Needle U-100 (BD INSULIN SYRINGE U/F) 31G X 5/16" 0.3 ML MISC Use 1 syringe 4x a day as directed    No facility-administered encounter medications on file as of 07/14/2017.     Surgical History:  History reviewed. No pertinent surgical history.  Medical History: Past Medical History:  Diagnosis Date  . Diabetes mellitus without complication ([place])   . Heart disease   . Hypertension     Family History: Family History  Problem Relation Age of Onset  . Diabetes Mother   . Brain cancer Mother   . Throat cancer Father   . Prostate cancer Brother     Social History   Socioeconomic History  . Marital status: Married    Spouse name: Not on file  . Number of children: Not on file  . Years of education: Not on file  . Highest education level: Not on file  Occupational History  . Not on file  Social Needs  . Financial resource strain: Not  on file  . Food insecurity:    Worry: Not on file    Inability: Not on file  . Transportation needs:    Medical: Not on file    Non-medical: Not on file  Tobacco Use  . Smoking status: Former Smoker    Types: Cigarettes  . Smokeless tobacco: Never Used  Substance and Sexual Activity  . Alcohol use: No    Frequency: Never  . Drug use: No  . Sexual activity: Not on file  Lifestyle  . Physical activity:    Days per week: Not on file    Minutes per session: Not on file  . Stress: Not on file  Relationships  . Social connections:    Talks on phone: Not on file    Gets together: Not on file    Attends religious service: Not on file    Active member of club or organization: Not on file    Attends meetings of clubs or organizations: Not on file    Relationship status: Not on file  . Intimate partner violence:    Fear of current or ex partner: Not on file    Emotionally abused: Not on file    Physically abused: Not on file    Forced sexual activity: Not on file  Other Topics Concern  . Not on file  Social History Narrative  . Not on file      Review of Systems  Constitutional: Negative for activity change, chills, fatigue and unexpected weight change.  HENT: Negative for congestion, postnasal drip, rhinorrhea, sneezing and sore throat.   Eyes: Negative.  Negative for redness.  Respiratory: Negative for cough, chest tightness and shortness of breath.   Cardiovascular: Negative for chest pain and palpitations.  Gastrointestinal: Negative for abdominal pain, constipation, diarrhea, nausea and vomiting.  Endocrine:       Thyroid and blood sugars are currently well controlled.   Genitourinary: Negative for dysuria and frequency.  Musculoskeletal: Negative for arthralgias, back pain, joint swelling and neck pain.  Skin: Negative for rash.  Allergic/Immunologic: Negative for environmental allergies.  Neurological: Negative for tremors and numbness.  Hematological: Negative for  adenopathy. Does not bruise/bleed easily.  Psychiatric/Behavioral: Negative for behavioral problems (Depression), dysphoric mood, sleep disturbance and suicidal ideas. The patient is not nervous/anxious.     Today's Vitals   07/14/17 1402  BP: 133/60  Pulse: (!) 58  Resp: 16  SpO2: 98%  Weight: 217 lb 6.4 oz (98.6 kg)  Height: 5\' 8"  (1.727 m)    Physical Exam  Constitutional: He is oriented to person, place, and time. He appears well-developed and well-nourished. No distress.  HENT:  Head: Normocephalic and atraumatic.  Mouth/Throat: Oropharynx is clear and moist. No oropharyngeal  exudate.  Eyes: Pupils are equal, round, and reactive to light. Conjunctivae and EOM are normal.  Neck: Normal range of motion. Neck supple. No JVD present. No tracheal deviation present. No thyromegaly present.  Cardiovascular: Normal rate and normal pulses. An irregular rhythm present. Exam reveals no gallop and no friction rub.  Murmur heard.  Systolic murmur is present with a grade of 2/6. Pulmonary/Chest: Effort normal and breath sounds normal. No respiratory distress. He has no wheezes. He has no rales. He exhibits no tenderness.  Abdominal: Soft. Bowel sounds are normal. There is no tenderness.  Musculoskeletal: Normal range of motion.  Lymphadenopathy:    He has no cervical adenopathy.  Neurological: He is alert and oriented to person, place, and time. No cranial nerve deficit.  Skin: Skin is warm and dry. He is not diaphoretic.  Psychiatric: He has a normal mood and affect. His behavior is normal. Judgment and thought content normal.  Nursing note and vitals reviewed.  Assessment/Plan:  1. Type 2 diabetes mellitus with complication, without long-term current use of insulin (HCC) Dm managed per endocrinology and currently well controlled  - Insulin Syringe-Needle U-100 (BD INSULIN SYRINGE U/F) 31G X 5/16" 0.3 ML MISC; Inject insulin [place] TID as indicated per sliding scale instuctions.  Dispense:  90 each; Refill: 3  2. Essential hypertension Stable. Continue bp medication as prescribed.   3. Hyperlipidemia, unspecified hyperlipidemia type Manage high cholesterol through diet and exercise.  General Counseling: kaelon weekes understanding of the findings of todays visit and agrees with plan of treatment. I have discussed any further diagnostic evaluation that may be needed or ordered today. We also reviewed his medications today. he has been encouraged to call the office with any questions or concerns that should arise related to todays visit.    Counseling:  This patient was seen by Leretha Pol, FNP- C in Collaboration with Dr Lavera Guise as a part of collaborative care agreement    Meds ordered this encounter  Medications  . Insulin Syringe-Needle U-100 (BD INSULIN SYRINGE U/F) 31G X 5/16" 0.3 ML MISC    Sig: Inject insulin [place] TID as indicated per sliding scale instuctions.    Dispense:  90 each    Refill:  3    Order Specific Question:   Supervising Provider    Answer:   Lavera Guise [[id number]]    Time spent: 54 Minutes      Dr Lavera Guise Internal medicine

## 2017-07-15 ENCOUNTER — Telehealth: Payer: Self-pay

## 2017-07-15 NOTE — Telephone Encounter (Signed)
Pt brought bill from [facility] to be rebilled.  I called [facility] billing and gave new dx code.  Spoke to [place] and she placed a refund to pt and is rebilling medicare with dx code R30.0.  Called pt and informed him of changes.  dbs

## 2017-08-11 ENCOUNTER — Encounter (INDEPENDENT_AMBULATORY_CARE_PROVIDER_SITE_OTHER): Payer: Medicare Other

## 2017-08-11 ENCOUNTER — Ambulatory Visit (INDEPENDENT_AMBULATORY_CARE_PROVIDER_SITE_OTHER): Payer: Medicare Other | Admitting: Vascular Surgery

## 2017-09-06 ENCOUNTER — Encounter: Payer: Self-pay | Admitting: Nurse Practitioner

## 2017-09-06 ENCOUNTER — Ambulatory Visit (INDEPENDENT_AMBULATORY_CARE_PROVIDER_SITE_OTHER): Payer: Medicare Other | Admitting: Nurse Practitioner

## 2017-09-06 VITALS — BP 145/55 | HR 56 | Temp 97.4°F | Resp 16 | Ht 68.0 in | Wt 212.8 lb

## 2017-09-06 DIAGNOSIS — S80861A Insect bite (nonvenomous), right lower leg, initial encounter: Secondary | ICD-10-CM

## 2017-09-06 DIAGNOSIS — L2089 Other atopic dermatitis: Secondary | ICD-10-CM | POA: Diagnosis not present

## 2017-09-06 DIAGNOSIS — W57XXXA Bitten or stung by nonvenomous insect and other nonvenomous arthropods, initial encounter: Principal | ICD-10-CM

## 2017-09-06 MED ORDER — MUPIROCIN 2 % EX OINT: TOPICAL_OINTMENT | CUTANEOUS | 1 refills | Status: DC

## 2017-09-06 MED ORDER — TRIAMCINOLONE ACETONIDE 0.025 % EX CREA: 1.0000 "application " | TOPICAL_CREAM | Freq: Two times a day (BID) | CUTANEOUS | 2 refills | Status: DC

## 2017-09-06 NOTE — Progress Notes (Signed)
[facility] [place], [place] [address]  Internal MEDICINE  Office Visit Note  Patient Name: Allen [name]  [id number]  [id number]  Date of Service: 09/25/2017   Pt is here for a sick visit.   Chief Complaint  Patient presents with  . Insect Bite    red spot on bottom of right leg went from one spot that spreaded into two spots and gotten bigger, no itching with it or pain, pt just notice it was there and used neosporine but that didnt work.      red spot on bottom of right leg went from one spot that spread into two spots and gotten bigger, no itching with it or pain. noticed it was there and used neosporine but that didnt work. Was initially noticed 3 to 4 days ago. Skin has remained intact and there has been no drainage noted from the lesion.      Current Medication:  Outpatient Encounter Medications as of 09/06/2017  Medication Sig Note  . aspirin 81 MG chewable tablet Chew by mouth daily.   Marland Kitchen b complex vitamins tablet Take 1 tablet by mouth daily.   . B-D ULTRA-FINE 33 LANCETS MISC Use 1 Lancet 3 (three) times daily. 11/24/2015: Received from: [place]  . cetirizine (ZYRTEC) 10 MG tablet Take 10 mg by mouth daily.   . Cholecalciferol (VITAMIN D3) 1000 units CAPS Take by mouth.   . Continuous Blood Gluc Sensor (FREESTYLE LIBRE SENSOR SYSTEM) MISC Use 3 each every 10 (ten) days.   . enalapril (VASOTEC) 20 MG tablet Take 20 mg by mouth daily.   Marland Kitchen ezetimibe-simvastatin (VYTORIN) 10-40 MG per tablet Take 1 tablet by mouth daily.   Marland Kitchen FLUZONE HIGH-DOSE 0.5 ML injection ADM 0.5ML IM UTD   . folic acid (FOLVITE) 366 MCG tablet Take 400 mcg by mouth daily.   Marland Kitchen gabapentin (NEURONTIN) 300 MG capsule Take 300 mg by mouth 3 (three) times daily.   . Garlic 4403 MG CAPS Take by mouth.   Marland Kitchen glucose blood (ONE TOUCH ULTRA TEST) test strip Check blood sugar 5x a day as directed   . hydrochlorothiazide (HYDRODIURIL) 25 MG tablet Take 12.5 mg by  mouth daily.    . insulin detemir (LEVEMIR) 100 UNIT/ML injection Inject into the skin at bedtime.   . insulin lispro (HUMALOG) 100 UNIT/ML injection Inject into the skin 3 (three) times daily before meals.   . Insulin Pen Needle (BD PEN NEEDLE NANO U/F) 32G X 4 MM MISC USE AS DIRECTED FOUR TIMES A DAY WITH LEVEMIR FLEXTOUCH AND HUMALOG PEN.  DX CODE E11.29   . Insulin Syringe-Needle U-100 (BD INSULIN SYRINGE U/F) 31G X 5/16" 0.3 ML MISC Inject insulin [place] TID as indicated per sliding scale instuctions.   Marland Kitchen JARDIANCE 25 MG TABS tablet    . levothyroxine (SYNTHROID, LEVOTHROID) 50 MCG tablet Take 50 mcg by mouth daily before breakfast.   . metFORMIN (GLUCOPHAGE) 1000 MG tablet Take 1,000 mg by mouth 2 (two) times daily with a meal.   . metoprolol succinate (TOPROL-XL) 50 MG 24 hr tablet  11/24/2015: Received from: External Pharmacy  . Multiple Vitamin (MULTI-VITAMINS) TABS Take by mouth.   . Multiple Vitamin (MULTIVITAMIN WITH MINERALS) TABS tablet Take 1 tablet by mouth daily.   . pioglitazone (ACTOS) 30 MG tablet Take 30 mg by mouth daily.    . traMADol (ULTRAM) 50 MG tablet as needed. 11/24/2015: Received from: [place]: 1-2 po bid  prn  . vitamin B-12 (CYANOCOBALAMIN) 1000 MCG tablet Take 1,000 mcg by mouth daily.   . Insulin Glargine (LANTUS SOLOSTAR) 100 UNIT/ML Solostar Pen Take 16u daily   . mupirocin ointment (BACTROBAN) 2 % Apply topically two times daily   . triamcinolone (KENALOG) 0.025 % cream Apply 1 application topically 2 (two) times daily.    No facility-administered encounter medications on file as of 09/06/2017.       Medical History: Past Medical History:  Diagnosis Date  . Diabetes mellitus without complication ([place])   . Heart disease   . Hypertension      Today's Vitals   09/06/17 1540  BP: (!) 145/55  Pulse: (!) 56  Resp: 16  Temp: (!) 97.4 F (36.3 C)  SpO2: 97%  Weight: 212 lb 12.8 oz (96.5 kg)  Height: 5\' 8"  (1.727 m)     Review of Systems  Constitutional: Negative for chills, fatigue and unexpected weight change.  HENT: Negative for congestion, postnasal drip, rhinorrhea, sneezing and sore throat.   Eyes: Negative.  Negative for redness.  Respiratory: Negative for cough, chest tightness, shortness of breath and wheezing.   Cardiovascular: Negative for chest pain and palpitations.  Gastrointestinal: Negative for abdominal pain, constipation, diarrhea, nausea and vomiting.  Endocrine: Negative for cold intolerance, heat intolerance, polydipsia, polyphagia and polyuria.  Genitourinary: Negative.  Negative for dysuria and frequency.  Musculoskeletal: Negative for arthralgias, back pain, joint swelling and neck pain.  Skin: Positive for rash.       Reddened, round patch of skin present on inner aspect of right leg.   Allergic/Immunologic: Negative for environmental allergies.  Neurological: Negative for dizziness, tremors, numbness and headaches.  Hematological: Negative for adenopathy. Does not bruise/bleed easily.  Psychiatric/Behavioral: Negative for behavioral problems (Depression), sleep disturbance and suicidal ideas. The patient is not nervous/anxious.     Physical Exam  Constitutional: He is oriented to person, place, and time. He appears well-developed and well-nourished. No distress.  HENT:  Head: Normocephalic and atraumatic.  Mouth/Throat: Oropharynx is clear and moist. No oropharyngeal exudate.  Eyes: Pupils are equal, round, and reactive to light. EOM are normal.  Neck: Normal range of motion. Neck supple. No JVD present. No tracheal deviation present. No thyromegaly present.  Cardiovascular: Normal rate, regular rhythm and normal heart sounds. Exam reveals no gallop and no friction rub.  No murmur heard. Pulmonary/Chest: Effort normal and breath sounds normal. No respiratory distress. He has no wheezes. He has no rales. He exhibits no tenderness.  Abdominal: Soft. Bowel sounds are normal.  There is no tenderness.  Musculoskeletal: Normal range of motion.  Lymphadenopathy:    He has no cervical adenopathy.  Neurological: He is alert and oriented to person, place, and time. No cranial nerve deficit.  Skin: Skin is warm and dry. Capillary refill takes less than 2 seconds. He is not diaphoretic.     Psychiatric: He has a normal mood and affect. His behavior is normal. Judgment and thought content normal.  Nursing note and vitals reviewed.  Assessment/Plan:  1. Insect bite of right lower leg, initial encounter Likely cause of round lesion on inner aspect of right lower leg. Will monitor.   2. Other atopic dermatitis - mupirocin ointment (BACTROBAN) 2 %; Apply topically two times daily  Dispense: 22 g; Refill: 1 - triamcinolone (KENALOG) 0.025 % cream; Apply 1 application topically 2 (two) times daily.  Dispense: 80 g; Refill: 2   General Counseling: rashad auld understanding of the findings of todays visit and  agrees with plan of treatment. I have discussed any further diagnostic evaluation that may be needed or ordered today. We also reviewed his medications today. he has been encouraged to call the office with any questions or concerns that should arise related to todays visit.    Counseling:  This patient was seen by [place] with Dr Lavera Guise as a part of collaborative care agreement    Meds ordered this encounter  Medications  . mupirocin ointment (BACTROBAN) 2 %    Sig: Apply topically two times daily    Dispense:  22 g    Refill:  1    Order Specific Question:   Supervising Provider    Answer:   Lavera Guise [[id number]]  . triamcinolone (KENALOG) 0.025 % cream    Sig: Apply 1 application topically 2 (two) times daily.    Dispense:  80 g    Refill:  2    Order Specific Question:   Supervising Provider    Answer:   Lavera Guise [[id number]]    Time spent: 15 Minutes

## 2017-09-07 ENCOUNTER — Ambulatory Visit (INDEPENDENT_AMBULATORY_CARE_PROVIDER_SITE_OTHER): Payer: Medicare Other

## 2017-09-07 DIAGNOSIS — G4733 Obstructive sleep apnea (adult) (pediatric): Secondary | ICD-10-CM | POA: Diagnosis not present

## 2017-09-07 NOTE — Progress Notes (Signed)
95 percentile pressure 11    95th percentile leak 41.7   apnea index 0.9 /hr  apnea-hypopnea index  2.2 /hr   total days used  >4 hr 86 days  total days used <4 hr 0 days  Total compliance 86 percent  Pt doing great no problems or questions.

## 2017-09-22 ENCOUNTER — Ambulatory Visit (INDEPENDENT_AMBULATORY_CARE_PROVIDER_SITE_OTHER): Payer: Medicare Other | Admitting: Vascular Surgery

## 2017-09-22 ENCOUNTER — Encounter (INDEPENDENT_AMBULATORY_CARE_PROVIDER_SITE_OTHER): Payer: Medicare Other

## 2017-09-25 DIAGNOSIS — W57XXXA Bitten or stung by nonvenomous insect and other nonvenomous arthropods, initial encounter: Secondary | ICD-10-CM

## 2017-09-25 DIAGNOSIS — L209 Atopic dermatitis, unspecified: Secondary | ICD-10-CM | POA: Insufficient documentation

## 2017-09-25 DIAGNOSIS — S80861A Insect bite (nonvenomous), right lower leg, initial encounter: Secondary | ICD-10-CM | POA: Insufficient documentation

## 2017-09-26 ENCOUNTER — Other Ambulatory Visit: Payer: Self-pay | Admitting: Nurse Practitioner

## 2017-09-26 MED ORDER — METOPROLOL SUCCINATE ER 50 MG PO TB24: 50.0000 mg | ORAL_TABLET | Freq: Every day | ORAL | 1 refills | Status: DC

## 2017-09-29 ENCOUNTER — Other Ambulatory Visit: Payer: Self-pay

## 2017-09-29 MED ORDER — METOPROLOL SUCCINATE ER 50 MG PO TB24
50.0000 mg | ORAL_TABLET | Freq: Every day | ORAL | 1 refills | Status: DC
Start: 2017-09-29 — End: 2018-01-16

## 2017-10-14 DIAGNOSIS — H6123 Impacted cerumen, bilateral: Secondary | ICD-10-CM | POA: Diagnosis not present

## 2017-10-14 DIAGNOSIS — H9190 Unspecified hearing loss, unspecified ear: Secondary | ICD-10-CM | POA: Diagnosis not present

## 2017-10-26 ENCOUNTER — Ambulatory Visit (INDEPENDENT_AMBULATORY_CARE_PROVIDER_SITE_OTHER): Payer: Medicare Other | Admitting: Vascular Surgery

## 2017-10-26 ENCOUNTER — Encounter (INDEPENDENT_AMBULATORY_CARE_PROVIDER_SITE_OTHER): Payer: Self-pay | Admitting: Vascular Surgery

## 2017-10-26 ENCOUNTER — Ambulatory Visit (INDEPENDENT_AMBULATORY_CARE_PROVIDER_SITE_OTHER): Payer: Medicare Other

## 2017-10-26 VITALS — BP 126/64 | HR 55 | Resp 16 | Ht 68.0 in | Wt 210.0 lb

## 2017-10-26 DIAGNOSIS — E118 Type 2 diabetes mellitus with unspecified complications: Secondary | ICD-10-CM

## 2017-10-26 DIAGNOSIS — E785 Hyperlipidemia, unspecified: Secondary | ICD-10-CM

## 2017-10-26 DIAGNOSIS — I739 Peripheral vascular disease, unspecified: Secondary | ICD-10-CM

## 2017-10-26 NOTE — Progress Notes (Signed)
Subjective:    Patient ID: Allen [name], male    DOB: 04-23-1940, 77 y.o.   MRN: [id number] Chief Complaint  Patient presents with  . Follow-up    54month abi,bil arterial   Patient presents for a six-month atherosclerotic disease follow-up.  The patient presents today without complaint.  The patient denies any claudication-like symptoms, rest pain or ulcer formation to the bilateral lower extremity.  The patient denies any worsening bilateral leg edema.  The patient underwent a bilateral ABI which was notable for Right: 1.01, triphasic / Left: .53, monophasic.  The patient also underwent a bilateral lower extremity arterial duplex which was notable for a right patent SFA distal stent.  Left monophasic blood flow distally.  3 to 49% stenosis noted in the deep femoral artery.  Left profunda artery and distal SFA as well as the popliteal were not visualized well.  Overall this is stable when compared to the patient's ABI and duplex approximately 6 months ago.  The patient denies any fever, nausea vomiting.  Review of Systems  Constitutional: Negative.   HENT: Negative.   Eyes: Negative.   Respiratory: Negative.   Cardiovascular: Negative.   Gastrointestinal: Negative.   Endocrine: Negative.   Genitourinary: Negative.   Musculoskeletal: Negative.   Skin: Negative.   Allergic/Immunologic: Negative.   Neurological: Negative.   Hematological: Negative.   Psychiatric/Behavioral: Negative.       Objective:   Physical Exam  Constitutional: He is oriented to person, place, and time. He appears well-developed and well-nourished. No distress.  HENT:  Head: Normocephalic and atraumatic.  Right Ear: External ear normal.  Left Ear: External ear normal.  Eyes: Pupils are equal, round, and reactive to light. Conjunctivae and EOM are normal.  Neck: Normal range of motion.  Cardiovascular: Normal rate, regular rhythm, normal heart sounds and intact distal pulses.  Pulses:      Radial pulses are 2+  on the right side, and 2+ on the left side.  Hard to palpate pedal pulses however the bilateral feet are warm.   Pulmonary/Chest: Effort normal and breath sounds normal.  Musculoskeletal: Normal range of motion. He exhibits no edema.  Neurological: He is alert and oriented to person, place, and time.  Skin: Skin is warm and dry. He is not diaphoretic.  Psychiatric: He has a normal mood and affect. His behavior is normal. Judgment and thought content normal.  Vitals reviewed.  BP 126/64 (BP Location: Right Arm)   Pulse (!) 55   Resp 16   Ht 5\' 8"  (1.727 m)   Wt 210 lb (95.3 kg)   BMI 31.93 kg/m   Past Medical History:  Diagnosis Date  . Diabetes mellitus without complication ([place])   . Heart disease   . Hypertension    Social History   Socioeconomic History  . Marital status: Married    Spouse name: Not on file  . Number of children: Not on file  . Years of education: Not on file  . Highest education level: Not on file  Occupational History  . Not on file  Social Needs  . Financial resource strain: Not on file  . Food insecurity:    Worry: Not on file    Inability: Not on file  . Transportation needs:    Medical: Not on file    Non-medical: Not on file  Tobacco Use  . Smoking status: Former Smoker    Types: Cigarettes  . Smokeless tobacco: Never Used  Substance and Sexual Activity  .  Alcohol use: No    Frequency: Never  . Drug use: No  . Sexual activity: Not on file  Lifestyle  . Physical activity:    Days per week: Not on file    Minutes per session: Not on file  . Stress: Not on file  Relationships  . Social connections:    Talks on phone: Not on file    Gets together: Not on file    Attends religious service: Not on file    Active member of club or organization: Not on file    Attends meetings of clubs or organizations: Not on file    Relationship status: Not on file  . Intimate partner violence:    Fear of current or ex partner: Not on file     Emotionally abused: Not on file    Physically abused: Not on file    Forced sexual activity: Not on file  Other Topics Concern  . Not on file  Social History Narrative  . Not on file   Past Surgical History:  Procedure Laterality Date  . CARDIAC CATHETERIZATION    . LEG SURGERY Right    stent placement   Family History  Problem Relation Age of Onset  . Diabetes Mother   . Brain cancer Mother   . Throat cancer Father   . Prostate cancer Brother    Allergies  Allergen Reactions  . Iodinated Diagnostic Agents Anaphylaxis and Rash    X-Ray contrast dyes- anaphylaxis & rash  . Iohexol Hives and Shortness Of Breath    Pt states that he had IV dye for a kidney x-ray years ago and after injection of the dye he had hives all over his body, throat swelling, and SOB.  . Duloxetine Other (See Comments)    Sick to stomach and chills  . Dye Fdc Red [Red Dye] Hives  . Fluticasone Other (See Comments)    Caused nosebleeds  . Penicillins       Assessment & Plan:  Patient presents for a six-month atherosclerotic disease follow-up.  The patient presents today without complaint.  The patient denies any claudication-like symptoms, rest pain or ulcer formation to the bilateral lower extremity.  The patient denies any worsening bilateral leg edema.  The patient underwent a bilateral ABI which was notable for Right: 1.01, triphasic / Left: .53, monophasic.  The patient also underwent a bilateral lower extremity arterial duplex which was notable for a right patent SFA distal stent.  Left monophasic blood flow distally.  3 to 49% stenosis noted in the deep femoral artery.  Left profunda artery and distal SFA as well as the popliteal were not visualized well.  Overall this is stable when compared to the patient's ABI and duplex approximately 6 months ago.  The patient denies any fever, nausea vomiting.  1. PAD (peripheral artery disease) ([place]) - Stable Patient presents for a six-month peripheral artery  disease follow-up. The patient presents today asymptomatically and denies any claudication-like symptoms, rest pain or ulcer formation to the bilateral lower extremity Patient with relatively stable ABI of duplex when compared to 6 months ago Normal arterial blood flow to the right lower extremity moderate disease to the left At this time patient does not have any symptoms and does not want to move forward with any type of angiogram. Patient is to follow-up in 6 months and undergo a ABI bilateral lower extremity arterial duplex I have discussed with the patient at length the risk factors for and pathogenesis of atherosclerotic  disease and encouraged a healthy diet, regular exercise regimen and blood pressure / glucose control.  The patient was encouraged to call the office in the interim if he experiences any claudication like symptoms, rest pain or ulcers to his feet / toes.  - VAS [place] ABI WITH/WO TBI; Future - VAS [place] LOWER EXTREMITY ARTERIAL DUPLEX; Future  2. Type 2 diabetes mellitus with complication, unspecified whether long term insulin use (HCC) - Stable Encouraged good control as its slows the progression of atherosclerotic disease  3. Hyperlipidemia, unspecified hyperlipidemia type - Stable Encouraged good control as its slows the progression of atherosclerotic disease  Current Outpatient Medications on File Prior to Visit  Medication Sig Dispense Refill  . aspirin 81 MG chewable tablet Chew by mouth daily.    Marland Kitchen b complex vitamins tablet Take 1 tablet by mouth daily.    . B-D ULTRA-FINE 33 LANCETS MISC Use 1 Lancet 3 (three) times daily.    . cetirizine (ZYRTEC) 10 MG tablet Take 10 mg by mouth daily.    . Cholecalciferol (VITAMIN D3) 1000 units CAPS Take by mouth.    . Continuous Blood Gluc Sensor (FREESTYLE LIBRE SENSOR SYSTEM) MISC Use 3 each every 10 (ten) days.    . enalapril (VASOTEC) 20 MG tablet Take 20 mg by mouth daily.    Marland Kitchen ezetimibe-simvastatin (VYTORIN) 10-40 MG per  tablet Take 1 tablet by mouth daily.    . folic acid (FOLVITE) 557 MCG tablet Take 400 mcg by mouth daily.    . Garlic 3220 MG CAPS Take by mouth.    Marland Kitchen glucose blood (ONE TOUCH ULTRA TEST) test strip Check blood sugar 5x a day as directed    . hydrochlorothiazide (HYDRODIURIL) 25 MG tablet Take 12.5 mg by mouth daily.     . insulin lispro (HUMALOG) 100 UNIT/ML injection Inject into the skin 3 (three) times daily before meals.    . Insulin Pen Needle (BD PEN NEEDLE NANO U/F) 32G X 4 MM MISC USE AS DIRECTED FOUR TIMES A DAY WITH LEVEMIR FLEXTOUCH AND HUMALOG PEN.  DX CODE E11.29    . Insulin Syringe-Needle U-100 (BD INSULIN SYRINGE U/F) 31G X 5/16" 0.3 ML MISC Inject insulin Temple TID as indicated per sliding scale instuctions. 90 each 3  . JARDIANCE 25 MG TABS tablet   4  . levothyroxine (SYNTHROID, LEVOTHROID) 50 MCG tablet Take 50 mcg by mouth daily before breakfast.    . metFORMIN (GLUCOPHAGE) 1000 MG tablet Take 1,000 mg by mouth 2 (two) times daily with a meal.    . metoprolol succinate (TOPROL-XL) 50 MG 24 hr tablet Take 1 tablet (50 mg total) by mouth daily. 90 tablet 1  . Multiple Vitamin (MULTI-VITAMINS) TABS Take by mouth.    . Multiple Vitamin (MULTIVITAMIN WITH MINERALS) TABS tablet Take 1 tablet by mouth daily.    . pioglitazone (ACTOS) 30 MG tablet Take 30 mg by mouth daily.     . traMADol (ULTRAM) 50 MG tablet as needed.    . vitamin B-12 (CYANOCOBALAMIN) 1000 MCG tablet Take 1,000 mcg by mouth daily.    Marland Kitchen FLUZONE HIGH-DOSE 0.5 ML injection ADM 0.5ML IM UTD  0  . gabapentin (NEURONTIN) 300 MG capsule Take 300 mg by mouth 3 (three) times daily.    . insulin detemir (LEVEMIR) 100 UNIT/ML injection Inject into the skin at bedtime.    . Insulin Glargine (LANTUS SOLOSTAR) 100 UNIT/ML Solostar Pen Take 16u daily    . mupirocin ointment (BACTROBAN) 2 % Apply  topically two times daily (Patient not taking: Reported on 10/26/2017) 22 g 1  . triamcinolone (KENALOG) 0.025 % cream Apply 1  application topically 2 (two) times daily. (Patient not taking: Reported on 10/26/2017) 80 g 2   No current facility-administered medications on file prior to visit.    There are no Patient Instructions on file for this visit. No follow-ups on file.  Wendelin Bradt A Ilia Engelbert, PA-C

## 2017-11-07 ENCOUNTER — Other Ambulatory Visit: Payer: Self-pay | Admitting: Nurse Practitioner

## 2017-11-07 MED ORDER — EZETIMIBE-SIMVASTATIN 10-40 MG PO TABS: 1.0000 | ORAL_TABLET | Freq: Every day | ORAL | 2 refills | Status: DC

## 2017-11-09 DIAGNOSIS — Z23 Encounter for immunization: Secondary | ICD-10-CM | POA: Diagnosis not present

## 2017-11-28 ENCOUNTER — Other Ambulatory Visit: Payer: Self-pay

## 2017-11-28 MED ORDER — ENALAPRIL MALEATE 20 MG PO TABS: 20.0000 mg | ORAL_TABLET | Freq: Every day | ORAL | 1 refills | Status: DC

## 2017-12-12 ENCOUNTER — Other Ambulatory Visit: Payer: Self-pay | Admitting: Nurse Practitioner

## 2017-12-12 MED ORDER — EZETIMIBE-SIMVASTATIN 10-40 MG PO TABS: 1.0000 | ORAL_TABLET | Freq: Every day | ORAL | 2 refills | Status: DC

## 2017-12-26 DIAGNOSIS — E538 Deficiency of other specified B group vitamins: Secondary | ICD-10-CM | POA: Diagnosis not present

## 2017-12-26 DIAGNOSIS — Z992 Dependence on renal dialysis: Secondary | ICD-10-CM | POA: Diagnosis not present

## 2017-12-26 DIAGNOSIS — E1151 Type 2 diabetes mellitus with diabetic peripheral angiopathy without gangrene: Secondary | ICD-10-CM | POA: Diagnosis not present

## 2017-12-26 DIAGNOSIS — E039 Hypothyroidism, unspecified: Secondary | ICD-10-CM | POA: Diagnosis not present

## 2017-12-26 DIAGNOSIS — N186 End stage renal disease: Secondary | ICD-10-CM | POA: Diagnosis not present

## 2017-12-26 DIAGNOSIS — Z794 Long term (current) use of insulin: Secondary | ICD-10-CM | POA: Diagnosis not present

## 2017-12-26 DIAGNOSIS — E1122 Type 2 diabetes mellitus with diabetic chronic kidney disease: Secondary | ICD-10-CM | POA: Diagnosis not present

## 2017-12-26 DIAGNOSIS — I10 Essential (primary) hypertension: Secondary | ICD-10-CM | POA: Diagnosis not present

## 2018-01-03 DIAGNOSIS — M5136 Other intervertebral disc degeneration, lumbar region: Secondary | ICD-10-CM | POA: Diagnosis not present

## 2018-01-03 DIAGNOSIS — M5416 Radiculopathy, lumbar region: Secondary | ICD-10-CM | POA: Diagnosis not present

## 2018-01-03 DIAGNOSIS — M48062 Spinal stenosis, lumbar region with neurogenic claudication: Secondary | ICD-10-CM | POA: Diagnosis not present

## 2018-01-08 ENCOUNTER — Encounter: Payer: Self-pay | Admitting: Radiology

## 2018-01-08 ENCOUNTER — Inpatient Hospital Stay
Admission: EM | Admit: 2018-01-08 | Discharge: 2018-01-08 | DRG: 871 | Disposition: A | Discharge status: Other Acute Inpatient Hospital | Payer: Medicare Other | Attending: Emergency Medicine | Admitting: Emergency Medicine

## 2018-01-08 ENCOUNTER — Ambulatory Visit (HOSPITAL_COMMUNITY)
Admission: AD | Admit: 2018-01-08 | Discharge: 2018-01-08 | Disposition: A | Discharge status: Home / Self Care | Payer: Medicare Other | Source: Other Acute Inpatient Hospital | Attending: Emergency Medicine | Admitting: Emergency Medicine

## 2018-01-08 ENCOUNTER — Emergency Department: Payer: Medicare Other

## 2018-01-08 ENCOUNTER — Other Ambulatory Visit: Payer: Self-pay

## 2018-01-08 DIAGNOSIS — K760 Fatty (change of) liver, not elsewhere classified: Secondary | ICD-10-CM | POA: Diagnosis not present

## 2018-01-08 DIAGNOSIS — Z79890 Hormone replacement therapy (postmenopausal): Secondary | ICD-10-CM | POA: Diagnosis not present

## 2018-01-08 DIAGNOSIS — E872 Acidosis, unspecified: Secondary | ICD-10-CM

## 2018-01-08 DIAGNOSIS — I700 Atherosclerosis of aorta: Secondary | ICD-10-CM | POA: Diagnosis not present

## 2018-01-08 DIAGNOSIS — Z87892 Personal history of anaphylaxis: Secondary | ICD-10-CM

## 2018-01-08 DIAGNOSIS — Z87891 Personal history of nicotine dependence: Secondary | ICD-10-CM | POA: Diagnosis not present

## 2018-01-08 DIAGNOSIS — E119 Type 2 diabetes mellitus without complications: Secondary | ICD-10-CM | POA: Diagnosis not present

## 2018-01-08 DIAGNOSIS — Z8042 Family history of malignant neoplasm of prostate: Secondary | ICD-10-CM | POA: Diagnosis not present

## 2018-01-08 DIAGNOSIS — K8309 Other cholangitis: Secondary | ICD-10-CM

## 2018-01-08 DIAGNOSIS — Z833 Family history of diabetes mellitus: Secondary | ICD-10-CM | POA: Diagnosis not present

## 2018-01-08 DIAGNOSIS — E1151 Type 2 diabetes mellitus with diabetic peripheral angiopathy without gangrene: Secondary | ICD-10-CM | POA: Diagnosis present

## 2018-01-08 DIAGNOSIS — E039 Hypothyroidism, unspecified: Secondary | ICD-10-CM | POA: Diagnosis present

## 2018-01-08 DIAGNOSIS — I509 Heart failure, unspecified: Secondary | ICD-10-CM | POA: Diagnosis present

## 2018-01-08 DIAGNOSIS — K819 Cholecystitis, unspecified: Secondary | ICD-10-CM | POA: Diagnosis present

## 2018-01-08 DIAGNOSIS — Z880 Allergy status to penicillin: Secondary | ICD-10-CM | POA: Diagnosis not present

## 2018-01-08 DIAGNOSIS — K8037 Calculus of bile duct with acute and chronic cholangitis with obstruction: Secondary | ICD-10-CM | POA: Diagnosis present

## 2018-01-08 DIAGNOSIS — K7200 Acute and subacute hepatic failure without coma: Secondary | ICD-10-CM

## 2018-01-08 DIAGNOSIS — N179 Acute kidney failure, unspecified: Secondary | ICD-10-CM | POA: Diagnosis not present

## 2018-01-08 DIAGNOSIS — R079 Chest pain, unspecified: Secondary | ICD-10-CM | POA: Diagnosis present

## 2018-01-08 DIAGNOSIS — Z808 Family history of malignant neoplasm of other organs or systems: Secondary | ICD-10-CM | POA: Diagnosis not present

## 2018-01-08 DIAGNOSIS — R6521 Severe sepsis with septic shock: Secondary | ICD-10-CM | POA: Diagnosis present

## 2018-01-08 DIAGNOSIS — R109 Unspecified abdominal pain: Secondary | ICD-10-CM | POA: Insufficient documentation

## 2018-01-08 DIAGNOSIS — K810 Acute cholecystitis: Secondary | ICD-10-CM | POA: Diagnosis not present

## 2018-01-08 DIAGNOSIS — L0390 Cellulitis, unspecified: Secondary | ICD-10-CM | POA: Diagnosis not present

## 2018-01-08 DIAGNOSIS — G4733 Obstructive sleep apnea (adult) (pediatric): Secondary | ICD-10-CM | POA: Diagnosis present

## 2018-01-08 DIAGNOSIS — Z91041 Radiographic dye allergy status: Secondary | ICD-10-CM | POA: Diagnosis not present

## 2018-01-08 DIAGNOSIS — J9811 Atelectasis: Secondary | ICD-10-CM | POA: Diagnosis not present

## 2018-01-08 DIAGNOSIS — E785 Hyperlipidemia, unspecified: Secondary | ICD-10-CM | POA: Diagnosis present

## 2018-01-08 DIAGNOSIS — I10 Essential (primary) hypertension: Secondary | ICD-10-CM | POA: Diagnosis present

## 2018-01-08 DIAGNOSIS — A419 Sepsis, unspecified organism: Secondary | ICD-10-CM | POA: Diagnosis not present

## 2018-01-08 DIAGNOSIS — Z7982 Long term (current) use of aspirin: Secondary | ICD-10-CM | POA: Diagnosis not present

## 2018-01-08 DIAGNOSIS — I110 Hypertensive heart disease with heart failure: Secondary | ICD-10-CM | POA: Diagnosis present

## 2018-01-08 DIAGNOSIS — K5730 Diverticulosis of large intestine without perforation or abscess without bleeding: Secondary | ICD-10-CM | POA: Diagnosis not present

## 2018-01-08 DIAGNOSIS — Z794 Long term (current) use of insulin: Secondary | ICD-10-CM

## 2018-01-08 DIAGNOSIS — Z1611 Resistance to penicillins: Secondary | ICD-10-CM | POA: Diagnosis present

## 2018-01-08 DIAGNOSIS — R52 Pain, unspecified: Secondary | ICD-10-CM

## 2018-01-08 DIAGNOSIS — R001 Bradycardia, unspecified: Secondary | ICD-10-CM | POA: Diagnosis not present

## 2018-01-08 DIAGNOSIS — R1084 Generalized abdominal pain: Secondary | ICD-10-CM | POA: Diagnosis not present

## 2018-01-08 DIAGNOSIS — K828 Other specified diseases of gallbladder: Secondary | ICD-10-CM | POA: Diagnosis not present

## 2018-01-08 DIAGNOSIS — K8050 Calculus of bile duct without cholangitis or cholecystitis without obstruction: Secondary | ICD-10-CM | POA: Diagnosis not present

## 2018-01-08 DIAGNOSIS — R1011 Right upper quadrant pain: Secondary | ICD-10-CM | POA: Diagnosis not present

## 2018-01-08 DIAGNOSIS — I252 Old myocardial infarction: Secondary | ICD-10-CM | POA: Diagnosis not present

## 2018-01-08 DIAGNOSIS — I2510 Atherosclerotic heart disease of native coronary artery without angina pectoris: Secondary | ICD-10-CM | POA: Diagnosis not present

## 2018-01-08 DIAGNOSIS — K811 Chronic cholecystitis: Secondary | ICD-10-CM | POA: Diagnosis not present

## 2018-01-08 DIAGNOSIS — K838 Other specified diseases of biliary tract: Secondary | ICD-10-CM | POA: Diagnosis not present

## 2018-01-08 DIAGNOSIS — I348 Other nonrheumatic mitral valve disorders: Secondary | ICD-10-CM | POA: Diagnosis not present

## 2018-01-08 DIAGNOSIS — K8040 Calculus of bile duct with cholecystitis, unspecified, without obstruction: Secondary | ICD-10-CM | POA: Diagnosis not present

## 2018-01-08 DIAGNOSIS — A4159 Other Gram-negative sepsis: Secondary | ICD-10-CM | POA: Diagnosis not present

## 2018-01-08 LAB — BLOOD CULTURE ID PANEL (REFLEXED)
ACINETOBACTER BAUMANNII: NOT DETECTED
CANDIDA ALBICANS: NOT DETECTED
CANDIDA GLABRATA: NOT DETECTED
CANDIDA KRUSEI: NOT DETECTED
Candida parapsilosis: NOT DETECTED
Candida tropicalis: NOT DETECTED
Carbapenem resistance: NOT DETECTED
ENTEROBACTER CLOACAE COMPLEX: NOT DETECTED
ENTEROCOCCUS SPECIES: NOT DETECTED
ESCHERICHIA COLI: NOT DETECTED
Enterobacteriaceae species: DETECTED — AB
Haemophilus influenzae: NOT DETECTED
KLEBSIELLA OXYTOCA: NOT DETECTED
Klebsiella pneumoniae: DETECTED — AB
LISTERIA MONOCYTOGENES: NOT DETECTED
Neisseria meningitidis: NOT DETECTED
PROTEUS SPECIES: NOT DETECTED
Pseudomonas aeruginosa: NOT DETECTED
SERRATIA MARCESCENS: NOT DETECTED
STAPHYLOCOCCUS SPECIES: NOT DETECTED
Staphylococcus aureus (BCID): NOT DETECTED
Streptococcus agalactiae: NOT DETECTED
Streptococcus pneumoniae: NOT DETECTED
Streptococcus pyogenes: NOT DETECTED
Streptococcus species: NOT DETECTED

## 2018-01-08 LAB — LACTIC ACID, PLASMA
LACTIC ACID, VENOUS: 3.8 mmol/L — AB (ref 0.5–1.9)
Lactic Acid, Venous: 4.1 mmol/L (ref 0.5–1.9)

## 2018-01-08 LAB — COMPREHENSIVE METABOLIC PANEL
ALBUMIN: 3.3 g/dL — AB (ref 3.5–5.0)
ALT: 363 U/L — ABNORMAL HIGH (ref 0–44)
AST: 392 U/L — AB (ref 15–41)
Alkaline Phosphatase: 128 U/L — ABNORMAL HIGH (ref 38–126)
Anion gap: 12 (ref 5–15)
BUN: 21 mg/dL (ref 8–23)
CHLORIDE: 99 mmol/L (ref 98–111)
CO2: 29 mmol/L (ref 22–32)
Calcium: 9 mg/dL (ref 8.9–10.3)
Creatinine, Ser: 1.34 mg/dL — ABNORMAL HIGH (ref 0.61–1.24)
GFR calc Af Amer: 58 mL/min — ABNORMAL LOW (ref >60)
GFR, EST NON AFRICAN AMERICAN: 50 mL/min — AB (ref >60)
GLUCOSE: 134 mg/dL — AB (ref 70–99)
Potassium: 3.1 mmol/L — ABNORMAL LOW (ref 3.5–5.1)
SODIUM: 140 mmol/L (ref 135–145)
Total Bilirubin: 2.9 mg/dL — ABNORMAL HIGH (ref 0.3–1.2)
Total Protein: 6.6 g/dL (ref 6.5–8.1)

## 2018-01-08 LAB — BRAIN NATRIURETIC PEPTIDE: B Natriuretic Peptide: 301 pg/mL — ABNORMAL HIGH (ref 0.0–100.0)

## 2018-01-08 LAB — PREALBUMIN: PREALBUMIN: 13.9 mg/dL — AB (ref 18–38)

## 2018-01-08 LAB — PHOSPHORUS: PHOSPHORUS: 2.9 mg/dL (ref 2.5–4.6)

## 2018-01-08 LAB — CBC WITH DIFFERENTIAL/PLATELET
Abs Immature Granulocytes: 0.09 10*3/uL — ABNORMAL HIGH (ref 0.00–0.07)
BASOS ABS: 0 10*3/uL (ref 0.0–0.1)
Basophils Relative: 0 %
EOS ABS: 0.1 10*3/uL (ref 0.0–0.5)
Eosinophils Relative: 1 %
HCT: 37.7 % — ABNORMAL LOW (ref 39.0–52.0)
Hemoglobin: 12 g/dL — ABNORMAL LOW (ref 13.0–17.0)
IMMATURE GRANULOCYTES: 1 %
Lymphocytes Relative: 2 %
Lymphs Abs: 0.2 10*3/uL — ABNORMAL LOW (ref 0.7–4.0)
MCH: 32.3 pg (ref 26.0–34.0)
MCHC: 31.8 g/dL (ref 30.0–36.0)
MCV: 101.3 fL — AB (ref 80.0–100.0)
Monocytes Absolute: 0.4 10*3/uL (ref 0.1–1.0)
Monocytes Relative: 3 %
NEUTROS PCT: 93 %
NRBC: 0 % (ref 0.0–0.2)
Neutro Abs: 11.9 10*3/uL — ABNORMAL HIGH (ref 1.7–7.7)
PLATELETS: 202 10*3/uL (ref 150–400)
RBC: 3.72 MIL/uL — ABNORMAL LOW (ref 4.22–5.81)
RDW: 13.5 % (ref 11.5–15.5)
WBC: 12.7 10*3/uL — ABNORMAL HIGH (ref 4.0–10.5)

## 2018-01-08 LAB — MAGNESIUM: Magnesium: 1.6 mg/dL — ABNORMAL LOW (ref 1.7–2.4)

## 2018-01-08 LAB — PROCALCITONIN: Procalcitonin: 32.57 ng/mL

## 2018-01-08 LAB — LIPASE, BLOOD: LIPASE: 23 U/L (ref 11–51)

## 2018-01-08 LAB — AMMONIA: Ammonia: 30 umol/L (ref 9–35)

## 2018-01-08 LAB — TROPONIN I: Troponin I: 0.04 ng/mL (ref <0.03)

## 2018-01-08 LAB — FOLATE: FOLATE: 34 ng/mL (ref >5.9)

## 2018-01-08 LAB — VITAMIN B12: VITAMIN B 12: 1081 pg/mL — AB (ref 180–914)

## 2018-01-08 LAB — LACTATE DEHYDROGENASE: LDH: 243 U/L — AB (ref 98–192)

## 2018-01-08 MED ORDER — SODIUM CHLORIDE 0.9 % IV SOLN
2.0000 g | Freq: Once | INTRAVENOUS | Status: AC
  Administered 2018-01-08: 2 g via INTRAVENOUS
  Filled 2018-01-08: qty 2

## 2018-01-08 MED ORDER — POLYETHYLENE GLYCOL 3350 17 G PO PACK
17.00 | PACK | ORAL | Status: DC
Start: ? — End: 2018-01-08

## 2018-01-08 MED ORDER — VANCOMYCIN HCL 10 G IV SOLR
1250.0000 mg | Freq: Two times a day (BID) | INTRAVENOUS | Status: DC
  Administered 2018-01-08: 1250 mg via INTRAVENOUS
  Filled 2018-01-08: qty 1250

## 2018-01-08 MED ORDER — GENERIC EXTERNAL MEDICATION
8.00 | Status: DC
Start: ? — End: 2018-01-08

## 2018-01-08 MED ORDER — LACTATED RINGERS IV SOLN
100.00 | INTRAVENOUS | Status: DC
Start: ? — End: 2018-01-08

## 2018-01-08 MED ORDER — IOPAMIDOL (ISOVUE-370) INJECTION 76%
100.0000 mL | Freq: Once | INTRAVENOUS | Status: AC | PRN
  Administered 2018-01-08: 100 mL via INTRAVENOUS

## 2018-01-08 MED ORDER — EPINEPHRINE 0.3 MG/0.3ML IJ SOAJ
0.3000 mg | Freq: Once | INTRAMUSCULAR | Status: DC
  Filled 2018-01-08: qty 0.3

## 2018-01-08 MED ORDER — PIPERACILLIN-TAZOBACTAM 3.375 G IVPB
3.38 | INTRAVENOUS | Status: DC
Start: 2018-01-10 — End: 2018-01-08

## 2018-01-08 MED ORDER — INSULIN LISPRO 100 UNIT/ML ~~LOC~~ SOLN
0.00 | SUBCUTANEOUS | Status: DC
Start: 2018-01-12 — End: 2018-01-08

## 2018-01-08 MED ORDER — FOLIC ACID 1 MG PO TABS
1.00 | ORAL_TABLET | ORAL | Status: DC
Start: 2018-01-13 — End: 2018-01-08

## 2018-01-08 MED ORDER — VITAMIN B-12 1000 MCG PO TABS
1000.00 | ORAL_TABLET | ORAL | Status: DC
Start: 2018-01-13 — End: 2018-01-08

## 2018-01-08 MED ORDER — METRONIDAZOLE IN NACL 5-0.79 MG/ML-% IV SOLN
500.0000 mg | Freq: Three times a day (TID) | INTRAVENOUS | Status: DC
  Administered 2018-01-08: 500 mg via INTRAVENOUS
  Filled 2018-01-08: qty 100

## 2018-01-08 MED ORDER — LEVOTHYROXINE SODIUM 50 MCG PO TABS
50.00 | ORAL_TABLET | ORAL | Status: DC
Start: 2018-01-13 — End: 2018-01-08

## 2018-01-08 MED ORDER — MELATONIN 3 MG PO TABS
3.00 | ORAL_TABLET | ORAL | Status: DC
Start: ? — End: 2018-01-08

## 2018-01-08 MED ORDER — SODIUM CHLORIDE 0.9 % IV BOLUS
30.0000 mL/kg | Freq: Once | INTRAVENOUS | Status: AC
  Administered 2018-01-08: 2729.5 mL via INTRAVENOUS

## 2018-01-08 MED ORDER — NALOXONE HCL 0.4 MG/ML IJ SOLN
0.10 | INTRAMUSCULAR | Status: DC
Start: ? — End: 2018-01-08

## 2018-01-08 MED ORDER — DIPHENHYDRAMINE HCL 50 MG/ML IJ SOLN
50.0000 mg | Freq: Once | INTRAMUSCULAR | Status: AC
  Administered 2018-01-08: 50 mg via INTRAVENOUS
  Filled 2018-01-08: qty 1

## 2018-01-08 MED ORDER — HYDROCORTISONE NA SUCCINATE PF 100 MG IJ SOLR
INTRAMUSCULAR | Status: AC
  Filled 2018-01-08: qty 2

## 2018-01-08 MED ORDER — CHOLECALCIFEROL 25 MCG (1000 UT) PO TABS
1000.00 | ORAL_TABLET | ORAL | Status: DC
Start: 2018-01-13 — End: 2018-01-08

## 2018-01-08 MED ORDER — ALBUTEROL SULFATE (2.5 MG/3ML) 0.083% IN NEBU
2.50 | INHALATION_SOLUTION | RESPIRATORY_TRACT | Status: DC
Start: ? — End: 2018-01-08

## 2018-01-08 MED ORDER — ACETAMINOPHEN 325 MG PO TABS
650.00 | ORAL_TABLET | ORAL | Status: DC
Start: ? — End: 2018-01-08

## 2018-01-08 MED ORDER — GENERIC EXTERNAL MEDICATION
10.00 | Status: DC
Start: ? — End: 2018-01-08

## 2018-01-08 MED ORDER — MORPHINE SULFATE 2 MG/ML IJ SOLN
2.00 | INTRAMUSCULAR | Status: DC
Start: ? — End: 2018-01-08

## 2018-01-08 MED ORDER — CETIRIZINE HCL 10 MG PO TABS
10.00 | ORAL_TABLET | ORAL | Status: DC
Start: 2018-01-13 — End: 2018-01-08

## 2018-01-08 MED ORDER — GENERIC EXTERNAL MEDICATION
5.00 | Status: DC
Start: ? — End: 2018-01-08

## 2018-01-08 MED ORDER — DEXTROSE 10 % IV SOLN
12.50 | INTRAVENOUS | Status: DC
Start: ? — End: 2018-01-08

## 2018-01-08 MED ORDER — SODIUM CHLORIDE 0.9 % IV SOLN
100.00 | INTRAVENOUS | Status: DC
Start: ? — End: 2018-01-08

## 2018-01-08 MED ORDER — SODIUM CHLORIDE 0.9 % IV BOLUS
1000.0000 mL | Freq: Once | INTRAVENOUS | Status: AC
  Administered 2018-01-08: 1000 mL via INTRAVENOUS

## 2018-01-08 MED ORDER — GENERIC EXTERNAL MEDICATION
2.00 | Status: DC
Start: ? — End: 2018-01-08

## 2018-01-08 MED ORDER — THERA-M PO TABS
1.00 | ORAL_TABLET | ORAL | Status: DC
Start: 2018-01-13 — End: 2018-01-08

## 2018-01-08 MED ORDER — SODIUM CHLORIDE 0.9 % IV SOLN: 2.0000 g | Freq: Two times a day (BID) | INTRAVENOUS | Status: DC

## 2018-01-08 MED ORDER — HYDROCORTISONE NA SUCCINATE PF 250 MG IJ SOLR
200.0000 mg | Freq: Every day | INTRAMUSCULAR | Status: DC
  Administered 2018-01-08: 200 mg via INTRAVENOUS

## 2018-01-08 MED ORDER — EPINEPHRINE 0.3 MG/0.3ML IJ SOAJ
INTRAMUSCULAR | Status: AC
  Filled 2018-01-08: qty 0.3

## 2018-01-08 MED ORDER — VANCOMYCIN HCL IN DEXTROSE 1-5 GM/200ML-% IV SOLN
1000.0000 mg | Freq: Once | INTRAVENOUS | Status: AC
  Administered 2018-01-08: 1000 mg via INTRAVENOUS
  Filled 2018-01-08: qty 200

## 2018-01-08 NOTE — ED Notes (Signed)
Patient transported to CT 

## 2018-01-08 NOTE — Progress Notes (Signed)
Pharmacy Antibiotic Note  Allen [name] is a 77 y.o. male admitted on 01/08/2018 with sepsis.  Pharmacy has been consulted for vanc/cefepime dosing.  Plan: Will continue vanc 1.25g IV q12h w/ 6 hour stack  Will draw trough 11/25 @ 2000 prior to 4th dose. Will continue cefepime 2g IV q12h  Ke 0.0488 T1/2 ~ 12 hrs Goal trough 15 - 20 mcg/mL  Height: 5\' 9"  (175.3 cm) Weight: 204 lb (92.5 kg) IBW/kg (Calculated) : 70.7  Temp (24hrs), Avg:97.7 F (36.5 C), Min:97.7 F (36.5 C), Max:97.7 F (36.5 C)  Recent Labs  Lab 01/08/18 0056 01/08/18 0104 01/08/18 0252  WBC 12.7*  --   --   CREATININE 1.34*  --   --   LATICACIDVEN  --  4.1* 3.8*    Estimated Creatinine Clearance: 53.5 mL/min (A) (by C-G formula based on SCr of 1.34 mg/dL (H)).    Allergies  Allergen Reactions  . Iodinated Diagnostic Agents Anaphylaxis and Rash    X-Ray contrast dyes- anaphylaxis & rash  . Iohexol Hives and Shortness Of Breath    Pt states that he had IV dye for a kidney x-ray years ago and after injection of the dye he had hives all over his body, throat swelling, and SOB.  . Duloxetine Other (See Comments)    Sick to stomach and chills  . Dye Fdc Red [Red Dye] Hives  . Fluticasone Other (See Comments)    Caused nosebleeds  . Penicillins     Thank you for allowing pharmacy to be a part of this patient's care.  Tobie Lords, PharmD, BCPS Clinical Pharmacist 01/08/2018

## 2018-01-08 NOTE — ED Notes (Signed)
Critical lactic of 3.8 called from lab, dr. Mable Paris notified.

## 2018-01-08 NOTE — ED Notes (Signed)
Report to [facility].  

## 2018-01-08 NOTE — ED Provider Notes (Signed)
[facility] Emergency Department Provider Note  ____________________________________________   First MD Initiated Contact with Patient 01/08/18 [phone]     (approximate)  I have reviewed the triage vital signs and the nursing notes.   HISTORY  Chief Complaint Emesis and Chest Pain   HPI Allen [name] is a 77 y.o. male who comes to the emergency department by EMS with abdominal pain, chest pain, nausea, and fever to 103 degrees at home.  His symptoms came on gradually over the past 24 hours have slowly progressed and are now moderate to severe.  At their worst his pain was severe cramping aching in his upper abdomen radiating up towards his left chest.  He denies rhinorrhea or cough.  He does have a complex past medical history including peripheral artery disease, hypertension, diabetes mellitus type 2, and peripheral artery disease.   Nothing in particular seems to make the pain better or worse.  He does report previous anaphylaxis to IV contrast although has not had any contrast since the early 70s.   Past Medical History:  Diagnosis Date  . Diabetes mellitus without complication ([place])   . Heart disease   . Hypertension     Patient Active Problem List   Diagnosis Date Noted  . Septic shock ([place]) 01/08/2018  . Insect bite of right lower leg 09/25/2017  . Other atopic dermatitis 09/25/2017  . PAD (peripheral artery disease) ([place]) 11/24/2015  . Type 2 diabetes mellitus with complication ([place]) 98/12/9145  . Hyperlipidemia 11/24/2015  . Essential hypertension 11/24/2015  . PVD (peripheral vascular disease) ([place]) 10/29/2015    Past Surgical History:  Procedure Laterality Date  . CARDIAC CATHETERIZATION    . LEG SURGERY Right    stent placement    Prior to Admission medications   Medication Sig Start Date End Date Taking? Authorizing Provider  aspirin 81 MG chewable tablet Chew by mouth daily.    [provider]  b complex vitamins tablet Take 1  tablet by mouth daily.    [provider]  B-D ULTRA-FINE 33 LANCETS MISC Use 1 Lancet 3 (three) times daily. 05/21/15   [provider]  cetirizine (ZYRTEC) 10 MG tablet Take 10 mg by mouth daily.    [provider]  Cholecalciferol (VITAMIN D3) 1000 units CAPS Take by mouth.    [provider]  Continuous Blood Gluc Sensor (FREESTYLE LIBRE SENSOR SYSTEM) MISC Use 3 each every 10 (ten) days. 06/17/16   [provider]  enalapril (VASOTEC) 20 MG tablet Take 1 tablet (20 mg total) by mouth daily. 11/28/17   Ronnell Freshwater, NP  ezetimibe-simvastatin (VYTORIN) 10-40 MG tablet Take 1 tablet by mouth daily. 12/12/17   Ronnell Freshwater, NP  FLUZONE HIGH-DOSE 0.5 ML injection ADM 0.5ML IM UTD 11/30/16   [provider]  folic acid (FOLVITE) 829 MCG tablet Take 400 mcg by mouth daily.    [provider]  gabapentin (NEURONTIN) 300 MG capsule Take 300 mg by mouth 3 (three) times daily.    [provider]  Garlic 5621 MG CAPS Take by mouth.    [provider]  glucose blood (ONE TOUCH ULTRA TEST) test strip Check blood sugar 5x a day as directed 07/30/15   [provider]  hydrochlorothiazide (HYDRODIURIL) 25 MG tablet Take 12.5 mg by mouth daily.     [provider]  insulin detemir (LEVEMIR) 100 UNIT/ML injection Inject into the skin at bedtime.    [provider]  Insulin  Glargine (LANTUS SOLOSTAR) 100 UNIT/ML Solostar Pen Take 16u daily 06/17/16   [provider]  insulin lispro (HUMALOG) 100 UNIT/ML injection Inject into the skin 3 (three) times daily before meals.    [provider]  Insulin Pen Needle (BD PEN NEEDLE NANO U/F) 32G X 4 MM MISC USE AS DIRECTED FOUR TIMES A DAY WITH LEVEMIR FLEXTOUCH AND HUMALOG PEN.  DX CODE E11.29 07/26/16   [provider]  Insulin Syringe-Needle U-100 (BD INSULIN SYRINGE U/F) 31G X 5/16" 0.3 ML MISC Inject insulin Steele TID as indicated per  sliding scale instuctions. 07/14/17   Ronnell Freshwater, NP  JARDIANCE 25 MG TABS tablet  07/07/17   [provider]  levothyroxine (SYNTHROID, LEVOTHROID) 50 MCG tablet Take 50 mcg by mouth daily before breakfast.    [provider]  metFORMIN (GLUCOPHAGE) 1000 MG tablet Take 1,000 mg by mouth 2 (two) times daily with a meal.    [provider]  metoprolol succinate (TOPROL-XL) 50 MG 24 hr tablet Take 1 tablet (50 mg total) by mouth daily. 09/29/17   Ronnell Freshwater, NP  Multiple Vitamin (MULTI-VITAMINS) TABS Take by mouth.    [provider]  Multiple Vitamin (MULTIVITAMIN WITH MINERALS) TABS tablet Take 1 tablet by mouth daily.    [provider]  mupirocin ointment (BACTROBAN) 2 % Apply topically two times daily Patient not taking: Reported on 10/26/2017 09/06/17   Ronnell Freshwater, NP  pioglitazone (ACTOS) 30 MG tablet Take 30 mg by mouth daily.     [provider]  traMADol (ULTRAM) 50 MG tablet as needed. 11/17/15   [provider]  triamcinolone (KENALOG) 0.025 % cream Apply 1 application topically 2 (two) times daily. Patient not taking: Reported on 10/26/2017 09/06/17   Ronnell Freshwater, NP  vitamin B-12 (CYANOCOBALAMIN) 1000 MCG tablet Take 1,000 mcg by mouth daily.    [provider]    Allergies Iodinated diagnostic agents; Iohexol; Duloxetine; Dye fdc red [red dye]; Fluticasone; and Penicillins  Family History  Problem Relation Age of Onset  . Diabetes Mother   . Brain cancer Mother   . Throat cancer Father   . Prostate cancer Brother     Social History Social History   Tobacco Use  . Smoking status: Former Smoker    Types: Cigarettes  . Smokeless tobacco: Never Used  Substance Use Topics  . Alcohol use: No    Frequency: Never  . Drug use: No    Review of Systems Constitutional: Positive for fevers and chills Eyes: No visual changes. ENT: No sore throat. Cardiovascular: Positive for chest  pain. Respiratory: Denies shortness of breath. Gastrointestinal: Positive for abdominal pain.  Positive for nausea, no vomiting.  No diarrhea.  No constipation. Genitourinary: Negative for dysuria. Musculoskeletal: Negative for back pain. Skin: Negative for rash. Neurological: Negative for headaches, focal weakness or numbness.   ____________________________________________   PHYSICAL EXAM:  VITAL SIGNS: ED Triage Vitals  Enc Vitals Group     BP      Pulse      Resp      Temp      Temp src      SpO2      Weight      Height      Head Circumference      Peak Flow      Pain Score      Pain Loc      Pain Edu?      Excl. in  GC?     Constitutional: Alert and oriented x4 appears quite uncomfortable holding his upper abdomen with faint diaphoresis Eyes: PERRL EOMI. Head: Atraumatic. Nose: No congestion/rhinnorhea. Mouth/Throat: No trismus Neck: No stridor.   Cardiovascular: Normal rate, regular rhythm. Grossly normal heart sounds.  Delayed peripheral circulation.  He appears somewhat mottled. Respiratory: Normal respiratory effort.  No retractions. Lungs CTAB and moving good air Gastrointestinal: Soft abdomen quite tender in his upper abdomen although no frank peritonitis no McBurney's tenderness negative Rovsing's Musculoskeletal: No lower extremity edema   Neurologic:  Normal speech and language. No gross focal neurologic deficits are appreciated. Skin: Mildly diaphoretic. Psychiatric: Mood and affect are normal. Speech and behavior are normal.    ____________________________________________   DIFFERENTIAL includes but not limited to  Sepsis, septic shock, aortic dissection, mesenteric ischemia, acute coronary syndrome ____________________________________________   LABS (all labs ordered are listed, but only abnormal results are displayed)  Labs Reviewed  COMPREHENSIVE METABOLIC PANEL - Abnormal; Notable for the following components:      Result Value    Potassium 3.1 (*)    Glucose, Bld 134 (*)    Creatinine, Ser 1.34 (*)    Albumin 3.3 (*)    AST 392 (*)    ALT 363 (*)    Alkaline Phosphatase 128 (*)    Total Bilirubin 2.9 (*)    GFR calc non Af Amer 50 (*)    GFR calc Af Amer 58 (*)    All other components within normal limits  TROPONIN I - Abnormal; Notable for the following components:   Troponin I 0.04 (*)    All other components within normal limits  CBC WITH DIFFERENTIAL/PLATELET - Abnormal; Notable for the following components:   WBC 12.7 (*)    RBC 3.72 (*)    Hemoglobin 12.0 (*)    HCT 37.7 (*)    MCV 101.3 (*)    Neutro Abs 11.9 (*)    Lymphs Abs 0.2 (*)    Abs Immature Granulocytes 0.09 (*)    All other components within normal limits  BRAIN NATRIURETIC PEPTIDE - Abnormal; Notable for the following components:   B Natriuretic Peptide 301.0 (*)    All other components within normal limits  LACTIC ACID, PLASMA - Abnormal; Notable for the following components:   Lactic Acid, Venous 4.1 (*)    All other components within normal limits  LACTIC ACID, PLASMA - Abnormal; Notable for the following components:   Lactic Acid, Venous 3.8 (*)    All other components within normal limits  MAGNESIUM - Abnormal; Notable for the following components:   Magnesium 1.6 (*)    All other components within normal limits  LACTATE DEHYDROGENASE - Abnormal; Notable for the following components:   LDH 243 (*)    All other components within normal limits  CULTURE, BLOOD (ROUTINE X 2)  CULTURE, BLOOD (ROUTINE X 2)  LIPASE, BLOOD  PROCALCITONIN  PHOSPHORUS  FOLATE  AMMONIA  CALCIUM, IONIZED  PREALBUMIN  GAMMA GT  VITAMIN B12    Lab work reviewed by me with a number of critical abnormalities.  Most notably elevated lactic acid of 4.1 clearing only to 3.8 concerning for septic shock.  Elevated white count concerning for bacterial infection.  Transaminitis and elevated bilirubin are concerning for biliary  disease. __________________________________________  EKG  ED ECG REPORT I, Darel Hong, the attending physician, personally viewed and interpreted this ECG.  Date: 01/08/2018 EKG Time:  Rate: 61 Rhythm: normal sinus rhythm QRS Axis: normal Intervals:  normal ST/T Wave abnormalities: normal Narrative Interpretation: no evidence of acute ischemia  ____________________________________________  RADIOLOGY  CT chest abdomen pelvis dissection protocol reviewed by me consistent with acute cholecystitis along with cholangitis.  No evidence of dissection or mesenteric ischemia.  Possible shock liver and hypoperfusion of the liver. Right upper quadrant ultrasound reviewed by me consistent with choledocholithiasis and cholecystitis. ____________________________________________   PROCEDURES  Procedure(s) performed: Yes   Angiocath insertion Performed by: Darel Hong  Consent: Verbal consent obtained. Risks and benefits: risks, benefits and alternatives were discussed Time out: Immediately prior to procedure a "time out" was called to verify the correct patient, procedure, equipment, support staff and site/side marked as required.  Preparation: Patient was prepped and draped in the usual sterile fashion.  Vein Location: Left upper forearm  Ultrasound Guided  Gauge: 16  Normal blood return and flush without difficulty Patient tolerance: Patient tolerated the procedure well with no immediate complications.     .Critical Care Performed by: Darel Hong, MD Authorized by: Darel Hong, MD   Critical care provider statement:    Critical care time (minutes):  75   Critical care time was exclusive of:  Separately billable procedures and treating other patients   Critical care was necessary to treat or prevent imminent or life-threatening deterioration of the following conditions:  Shock and sepsis   Critical care was time spent personally by me on the following  activities:  Development of treatment plan with patient or surrogate, discussions with consultants, evaluation of patient's response to treatment, examination of patient, obtaining history from patient or surrogate, ordering and performing treatments and interventions, ordering and review of laboratory studies, ordering and review of radiographic studies, pulse oximetry, re-evaluation of patient's condition and review of old charts    Critical Care performed: Yes  ____________________________________________   INITIAL IMPRESSION / ASSESSMENT AND PLAN / ED COURSE  Pertinent labs & imaging results that were available during my care of the patient were reviewed by me and considered in my medical decision making (see chart for details).   As part of my medical decision making, I reviewed the following data within the [place] History obtained from family if available, nursing notes, old chart and ekg, as well as notes from prior ED visits.  Patient comes to the emergency department hypotensive to 90/40 and uncomfortable appearing.  His heart rate is normal although he is beta blocked at baseline.  Given his abdominal and chest pain along with his history of hypertension and peripheral artery disease I was concerned about aortic dissection versus mesenteric ischemia along with the typical etiologies.  I sent off a lactic acid in addition to typical labs and it came back elevated at 4.1 raising concern for mesenteric ischemia.  The patient reports previous anaphylaxis to IV contrast but has not had contrast since the early 70s and is never been exposed to moderate in contrast.  I will premedicate him with hydrocortisone and Benadryl now and recheck to radiology to discuss.     ----------------------------------------- 1:50 AM on 01/08/2018 -----------------------------------------  I discussed with the radiologist Dr. Gerilyn Nestle who agrees that given the patient's clinical picture CT  angiogram is vitally important to evaluate for dissection versus mesenteric ischemia.  He agrees with premedication now and I told him that I would personally escort the patient to CT scan with an EpiPen.  The worst case scenario would be anaphylaxis and anaphylactic shock which can be treated with epinephrine however we do not get a good  diagnosis the patient could die.  I discussed this with the patient and his wife and they both agree that the best course of action is to proceed with the CT scan. ____________________________________________ ----------------------------------------- 2:28 AM on 01/08/2018 -----------------------------------------  I reviewed the patient's images myself and to me it appears as if the left lobe of his liver is hypoperfused.  I then called [facility] radiology and spoke with Dr. Enriqueta Shutter who on brief evaluation agreed and said the gallbladder appeared to show cholecystitis as well however he will reviewed the images formally.  I have a call out to general surgery now to discuss.  Fortunately the patient has had no reaction to the IV contrast.   ----------------------------------------- 3:22 AM on 01/08/2018 -----------------------------------------  The patient's imaging is actually concerning for cholangitis.  I then discussed with Dr. Vicente Males on-call for gastroenterology and we do not have Dr. Allen Norris available for ERCP until Monday morning.  I then discussed with the patient and his wife regarding transfer and they have requested I speak to [facility].  I then spoke to the [facility] transfer center who said that they would be happy to accept the patient however at their medical intensive care unit is full at this point.  I am keeping the transfer options open to [facility] but I am also calling [facility] now given the patient's clinical condition.   I discussed the case with the ER physician at [facility] who has graciously agreed to accept the patient as an ER to ER transfer.  Prior to transfer he  received 30 cc/kg of IV fluid with an additional 1 L on top his blood pressure is now around 110 or so.  No indication for vasopressors at this point.  He is transferred in critical condition.   FINAL CLINICAL IMPRESSION(S) / ED DIAGNOSES  Final diagnoses:  Lactic acidosis  Septic shock (HCC)  RUQ pain  Cholecystitis  Shock liver  Pain  Cholangitis      NEW MEDICATIONS STARTED DURING THIS VISIT:  Discharge Medication List as of 01/08/2018  5:07 AM       Note:  This document was prepared using Dragon voice recognition software and may include unintentional dictation errors.     Darel Hong, MD 01/08/18 [phone]

## 2018-01-08 NOTE — Progress Notes (Signed)
CODE SEPSIS - PHARMACY COMMUNICATION  **Broad Spectrum Antibiotics should be administered within 1 hour of Sepsis diagnosis**  Time Code Sepsis Called/Page Received: 0137  Antibiotics Ordered: vanc/cefepime  Time of 1st antibiotic administration: 0151  Additional action taken by pharmacy:   If necessary, Name of Provider/Nurse Contacted:     Tobie Lords ,PharmD Clinical Pharmacist  01/08/2018  2:19 AM

## 2018-01-08 NOTE — ED Notes (Signed)
[facility] here 

## 2018-01-08 NOTE — ED Notes (Signed)
Critical lactic acid and troponin of 4.1 and 0.04 called from lab. Dr. Mable Paris notified, sepsis orders received.

## 2018-01-08 NOTE — ED Notes (Signed)
Pt states for several days "has felt lousy for a couple of days". Pt states he had upper abd pain yesterday with chest pain and nausea/vomiting. Pt had fever of 103 yesterday. Pt with pale skin. Per ems pt was hypotensive with blood pressure 90/48 on their arrival. Pt with history of DM, wears a blood glucose monitor to right upper arm.

## 2018-01-08 NOTE — ED Notes (Signed)
Patient transported to X-ray 

## 2018-01-08 NOTE — ED Triage Notes (Signed)
Pt with fever yesterday of 103 per ems. Ems states pt began to vomit last pm. Pt states did have dizziness and chest pain with emesis, no chest pain currently.

## 2018-01-08 NOTE — ED Notes (Signed)
Ultrasound here for [place] of abd.

## 2018-01-08 NOTE — Progress Notes (Signed)
PHARMACY - PHYSICIAN COMMUNICATION CRITICAL VALUE ALERT - BLOOD CULTURE IDENTIFICATION (BCID)  Results for orders placed or performed during the hospital encounter of 01/08/18  Blood Culture ID Panel (Reflexed) (Collected: 01/08/2018  1:44 AM)  Result Value Ref Range   Enterococcus species NOT DETECTED NOT DETECTED   Listeria monocytogenes NOT DETECTED NOT DETECTED   Staphylococcus species NOT DETECTED NOT DETECTED   Staphylococcus aureus (BCID) NOT DETECTED NOT DETECTED   Streptococcus species NOT DETECTED NOT DETECTED   Streptococcus agalactiae NOT DETECTED NOT DETECTED   Streptococcus pneumoniae NOT DETECTED NOT DETECTED   Streptococcus pyogenes NOT DETECTED NOT DETECTED   Acinetobacter baumannii NOT DETECTED NOT DETECTED   Enterobacteriaceae species DETECTED (A) NOT DETECTED   Enterobacter cloacae complex NOT DETECTED NOT DETECTED   Escherichia coli NOT DETECTED NOT DETECTED   Klebsiella oxytoca NOT DETECTED NOT DETECTED   Klebsiella pneumoniae DETECTED (A) NOT DETECTED   Proteus species NOT DETECTED NOT DETECTED   Serratia marcescens NOT DETECTED NOT DETECTED   Carbapenem resistance NOT DETECTED NOT DETECTED   Haemophilus influenzae NOT DETECTED NOT DETECTED   Neisseria meningitidis NOT DETECTED NOT DETECTED   Pseudomonas aeruginosa NOT DETECTED NOT DETECTED   Candida albicans NOT DETECTED NOT DETECTED   Candida glabrata NOT DETECTED NOT DETECTED   Candida krusei NOT DETECTED NOT DETECTED   Candida parapsilosis NOT DETECTED NOT DETECTED   Candida tropicalis NOT DETECTED NOT DETECTED    This patient is now at [facility] following an ER to ER transfer. I relayed this information by telephone to Amy, RN at [place], PharmD 01/08/2018  3:00 PM

## 2018-01-09 LAB — GAMMA GT: GGT: 74 U/L — ABNORMAL HIGH (ref 7–50)

## 2018-01-09 LAB — CALCIUM, IONIZED: Calcium, Ionized, Serum: 4.6 mg/dL (ref 4.5–5.6)

## 2018-01-09 MED ORDER — SODIUM CHLORIDE 0.9 % IV SOLN
100.00 | INTRAVENOUS | Status: DC
Start: ? — End: 2018-01-09

## 2018-01-10 MED ORDER — LACTATED RINGERS IV SOLN
125.00 | INTRAVENOUS | Status: DC
Start: ? — End: 2018-01-10

## 2018-01-11 LAB — CULTURE, BLOOD (ROUTINE X 2): Special Requests: ADEQUATE

## 2018-01-12 MED ORDER — CIPROFLOXACIN HCL 500 MG PO TABS
500.00 | ORAL_TABLET | ORAL | Status: DC
Start: 2018-01-12 — End: 2018-01-12

## 2018-01-12 NOTE — Progress Notes (Signed)
Spoke with Deanna RN at [facility] (patient is in bed [id number]). ED blood cultures came back today with klebsiella pneumonia. Patient is on oral ciprofloxacin and discharge pending per RN. Cipro will treat this klebsiella. No further follow up required.   Clemencia Helzer A. [place], [place].D., BCPS Clinical Pharmacist 01/12/2018

## 2018-01-16 ENCOUNTER — Encounter: Payer: Self-pay | Admitting: Nurse Practitioner

## 2018-01-16 ENCOUNTER — Ambulatory Visit (INDEPENDENT_AMBULATORY_CARE_PROVIDER_SITE_OTHER): Payer: Medicare Other | Admitting: Nurse Practitioner

## 2018-01-16 VITALS — BP 139/53 | HR 65 | Resp 16 | Ht 69.0 in | Wt 213.8 lb

## 2018-01-16 DIAGNOSIS — B372 Candidiasis of skin and nail: Secondary | ICD-10-CM | POA: Diagnosis not present

## 2018-01-16 DIAGNOSIS — B370 Candidal stomatitis: Secondary | ICD-10-CM | POA: Diagnosis not present

## 2018-01-16 DIAGNOSIS — R001 Bradycardia, unspecified: Secondary | ICD-10-CM | POA: Diagnosis not present

## 2018-01-16 DIAGNOSIS — R945 Abnormal results of liver function studies: Secondary | ICD-10-CM | POA: Diagnosis not present

## 2018-01-16 DIAGNOSIS — L209 Atopic dermatitis, unspecified: Secondary | ICD-10-CM

## 2018-01-16 DIAGNOSIS — E118 Type 2 diabetes mellitus with unspecified complications: Secondary | ICD-10-CM | POA: Diagnosis not present

## 2018-01-16 DIAGNOSIS — Z9049 Acquired absence of other specified parts of digestive tract: Secondary | ICD-10-CM | POA: Diagnosis not present

## 2018-01-16 DIAGNOSIS — Z0001 Encounter for general adult medical examination with abnormal findings: Secondary | ICD-10-CM

## 2018-01-16 DIAGNOSIS — I10 Essential (primary) hypertension: Secondary | ICD-10-CM | POA: Diagnosis not present

## 2018-01-16 MED ORDER — NYSTATIN 100000 UNIT/GM EX OINT: 1.0000 "application " | TOPICAL_OINTMENT | Freq: Three times a day (TID) | CUTANEOUS | 2 refills | Status: DC

## 2018-01-16 MED ORDER — NYSTATIN 100000 UNIT/ML MT SUSP: 5.0000 mL | Freq: Four times a day (QID) | OROMUCOSAL | 2 refills | Status: DC

## 2018-01-16 MED ORDER — METOPROLOL SUCCINATE ER 25 MG PO TB24: 50.0000 mg | ORAL_TABLET | Freq: Every day | ORAL | 3 refills | Status: DC

## 2018-01-16 MED ORDER — TRIAMCINOLONE ACETONIDE 0.1 % EX CREA: 1.0000 "application " | TOPICAL_CREAM | Freq: Two times a day (BID) | CUTANEOUS | 3 refills | Status: DC

## 2018-01-16 NOTE — Progress Notes (Signed)
[facility] [place], [place] [address]  Internal MEDICINE  Office Visit Note  Patient Name: Dev Dhondt  [id number]  [id number]  Date of Service: 01/18/2018  Chief Complaint  Patient presents with  . Medicare Wellness    6 month well visit     The patient is here for routine health maintenance exam. Last week, he went to ER last Friday. Had obstructing gallbladder stone in the bile duct. Had to have multiple rounds of IV antibiotics. Liver functions were severely elevated as was lactic acid. Had to be transferred to [place]. Had stone removed through endoscopy. Had gallbladder removal the next day. He was discharged home on Thanksgiving day. Gradually regaining strength. Appetite decreased, but improving daily. While hospitalized, he had several episodes of bradycardia. He did have cardiology consultation while hospitalized. Decreased his metoprolol ER to 67m daily. Blood pressure remains well controlled. He has history of MI in 1989. It was suggested he follow up with cardiologist locally.   Pt is here for routine health maintenance examination  Current Medication: Outpatient Encounter Medications as of 01/16/2018  Medication Sig Note  . aspirin 81 MG chewable tablet Chew by mouth daily.   .Marland Kitchenb complex vitamins tablet Take 1 tablet by mouth daily.   . B-D ULTRA-FINE 33 LANCETS MISC Use 1 Lancet 3 (three) times daily. 11/24/2015: Received from: [facility] . cetirizine (ZYRTEC) 10 MG tablet Take 10 mg by mouth daily.   . Cholecalciferol (VITAMIN D3) 1000 units CAPS Take by mouth.   . Continuous Blood Gluc Sensor (FREESTYLE LIBRE SENSOR SYSTEM) MISC Use 3 each every 10 (ten) days.   . enalapril (VASOTEC) 20 MG tablet Take 1 tablet (20 mg total) by mouth daily.   .Marland Kitchenezetimibe-simvastatin (VYTORIN) 10-40 MG tablet Take 1 tablet by mouth daily.   .Marland KitchenFLUZONE HIGH-DOSE 0.5 ML injection ADM 0.5ML IM UTD   . folic acid (FOLVITE) 4938MCG tablet  Take 400 mcg by mouth daily.   .Marland Kitchengabapentin (NEURONTIN) 300 MG capsule Take 300 mg by mouth 3 (three) times daily.   . Garlic 11017MG CAPS Take by mouth.   .Marland Kitchenglucose blood (ONE TOUCH ULTRA TEST) test strip Check blood sugar 5x a day as directed   . hydrochlorothiazide (HYDRODIURIL) 25 MG tablet Take 12.5 mg by mouth daily.    . insulin detemir (LEVEMIR) 100 UNIT/ML injection Inject into the skin at bedtime.   . Insulin Glargine (LANTUS SOLOSTAR) 100 UNIT/ML Solostar Pen Take 16u daily   . insulin lispro (HUMALOG) 100 UNIT/ML injection Inject into the skin 3 (three) times daily before meals.   . Insulin Pen Needle (BD PEN NEEDLE NANO U/F) 32G X 4 MM MISC USE AS DIRECTED FOUR TIMES A DAY WITH LEVEMIR FLEXTOUCH AND HUMALOG PEN.  DX CODE E11.29   . Insulin Syringe-Needle U-100 (BD INSULIN SYRINGE U/F) 31G X 5/16" 0.3 ML MISC Inject insulin [place] TID as indicated per sliding scale instuctions.   .Marland KitchenJARDIANCE 25 MG TABS tablet    . levothyroxine (SYNTHROID, LEVOTHROID) 50 MCG tablet Take 50 mcg by mouth daily before breakfast.   . metFORMIN (GLUCOPHAGE) 1000 MG tablet Take 1,000 mg by mouth 2 (two) times daily with a meal.   . metoprolol succinate (TOPROL-XL) 25 MG 24 hr tablet Take 2 tablets (50 mg total) by mouth daily.   . Multiple Vitamin (MULTI-VITAMINS) TABS Take by mouth.   . Multiple Vitamin (MULTIVITAMIN WITH MINERALS) TABS tablet Take 1 tablet  by mouth daily.   . mupirocin ointment (BACTROBAN) 2 % Apply topically two times daily   . pioglitazone (ACTOS) 30 MG tablet Take 30 mg by mouth daily.    . traMADol (ULTRAM) 50 MG tablet as needed. 11/24/2015: Received from: [place]: 1-2 po bid prn  . vitamin B-12 (CYANOCOBALAMIN) 1000 MCG tablet Take 1,000 mcg by mouth daily.   . [DISCONTINUED] metoprolol succinate (TOPROL-XL) 50 MG 24 hr tablet Take 1 tablet (50 mg total) by mouth daily.   . [DISCONTINUED] triamcinolone (KENALOG) 0.025 % cream Apply 1 application  topically 2 (two) times daily.   Marland Kitchen nystatin (MYCOSTATIN) 100000 UNIT/ML suspension Take 5 mLs (500,000 Units total) by mouth 4 (four) times daily.   Marland Kitchen nystatin ointment (MYCOSTATIN) Apply 1 application topically 3 (three) times daily.   Marland Kitchen triamcinolone cream (KENALOG) 0.1 % Apply 1 application topically 2 (two) times daily.    No facility-administered encounter medications on file as of 01/16/2018.     Surgical History: Past Surgical History:  Procedure Laterality Date  . CARDIAC CATHETERIZATION    . GALLBLADDER SURGERY    . LEG SURGERY Right    stent placement    Medical History: Past Medical History:  Diagnosis Date  . Diabetes mellitus without complication ([place])   . Heart disease   . Hypertension     Family History: Family History  Problem Relation Age of Onset  . Diabetes Mother   . Brain cancer Mother   . Throat cancer Father   . Prostate cancer Brother       Review of Systems  Constitutional: Positive for activity change and fatigue. Negative for chills and unexpected weight change.  HENT: Positive for sore throat and voice change. Negative for congestion, postnasal drip, rhinorrhea and sneezing.        Mouth pain and irritated throat.   Respiratory: Negative for cough, chest tightness, shortness of breath and wheezing.   Cardiovascular: Negative for chest pain and palpitations.  Gastrointestinal: Negative for abdominal pain, constipation, diarrhea, nausea and vomiting.       Abdominal soreness - had gallbladder surgery last week.   Endocrine: Negative for polydipsia and polyuria.       Diabetes is managed per endocrinologist.   Musculoskeletal: Negative for arthralgias, back pain, joint swelling and neck pain.  Skin: Negative for rash.  Allergic/Immunologic: Negative for environmental allergies.  Neurological: Negative for dizziness, tremors, numbness and headaches.  Hematological: Negative for adenopathy. Does not bruise/bleed easily.  Psychiatric/Behavioral:  Negative for behavioral problems (Depression), sleep disturbance and suicidal ideas. The patient is not nervous/anxious.     Today's Vitals   01/16/18 1410  BP: (!) 139/53  Pulse: 65  Resp: 16  SpO2: 99%  Weight: 213 lb 12.8 oz (97 kg)  Height: 5' 9" (1.753 m)    Physical Exam  Constitutional: He is oriented to person, place, and time. He appears well-developed and well-nourished. No distress.  HENT:  Head: Normocephalic and atraumatic.  Mouth/Throat: Posterior oropharyngeal erythema present. No oropharyngeal exudate.  Plaques of thrush on the tongue and mucus membranes of the cheeks and throat.   Eyes: Pupils are equal, round, and reactive to light. EOM are normal.  Neck: Normal range of motion. Neck supple. No JVD present. No tracheal deviation present. No thyromegaly present.  Cardiovascular: Normal rate, regular rhythm, normal heart sounds and intact distal pulses. Exam reveals no gallop and no friction rub.  No murmur heard. Pulses:      Dorsalis  pedis pulses are 1+ on the right side, and 1+ on the left side.       Posterior tibial pulses are 1+ on the right side, and 1+ on the left side.  Pulmonary/Chest: Effort normal and breath sounds normal. No respiratory distress. He has no wheezes. He has no rales. He exhibits no tenderness.  Abdominal: Soft. Bowel sounds are normal.  Well healing abdominal surgical scars present - laparoscopic cholecystectomy scars.   Musculoskeletal: Normal range of motion.       Right foot: There is normal range of motion and no deformity.       Left foot: There is normal range of motion and no deformity.  Feet:  Right Foot:  Protective Sensation: 10 sites tested. 10 sites sensed.  Left Foot:  Protective Sensation: 10 sites tested. 10 sites sensed.  Lymphadenopathy:    He has no cervical adenopathy.  Neurological: He is alert and oriented to person, place, and time. No cranial nerve deficit.  Skin: Skin is warm and dry. Capillary refill takes  less than 2 seconds. He is not diaphoretic.  Psychiatric: He has a normal mood and affect. His behavior is normal. Judgment and thought content normal.  Nursing note and vitals reviewed.  Depression screen [facility] 2/9 01/16/2018 09/06/2017 07/14/2017 01/31/2017  Decreased Interest 0 0 0 0  Down, Depressed, Hopeless 0 0 0 0  PHQ - 2 Score 0 0 0 0    Functional Status Survey: Is the patient deaf or have difficulty hearing?: No(pt has hearing aides) Does the patient have difficulty seeing, even when wearing glasses/contacts?: No Does the patient have difficulty concentrating, remembering, or making decisions?: No Does the patient have difficulty walking or climbing stairs?: No Does the patient have difficulty dressing or bathing?: No Does the patient have difficulty doing errands alone such as visiting a doctor's office or shopping?: No  MMSE - [place] Exam 01/16/2018  Orientation to time 5  Orientation to Place 5  Registration 3  Attention/ Calculation 5  Recall 3  Language- name 2 objects 2  Language- repeat 1  Language- follow 3 step command 3  Language- read & follow direction 1  Write a sentence 1  Copy design 1  Total score 30    Fall Risk  01/16/2018 09/06/2017 07/14/2017 01/31/2017  Falls in the past year? 0 No No No     LABS: Recent Results (from the past 2160 hour(s))  Comprehensive metabolic panel     Status: Abnormal   Collection Time: 01/08/18 12:56 AM  Result Value Ref Range   Sodium 140 135 - 145 mmol/L   Potassium 3.1 (L) 3.5 - 5.1 mmol/L   Chloride 99 98 - 111 mmol/L   CO2 29 22 - 32 mmol/L   Glucose, Bld 134 (H) 70 - 99 mg/dL   BUN 21 8 - 23 mg/dL   Creatinine, Ser 1.34 (H) 0.61 - 1.24 mg/dL   Calcium 9.0 8.9 - 10.3 mg/dL   Total Protein 6.6 6.5 - 8.1 g/dL   Albumin 3.3 (L) 3.5 - 5.0 g/dL   AST 392 (H) 15 - 41 U/L   ALT 363 (H) 0 - 44 U/L   Alkaline Phosphatase 128 (H) 38 - 126 U/L   Total Bilirubin 2.9 (H) 0.3 - 1.2 mg/dL   GFR calc non Af Amer  50 (L) >60 mL/min   GFR calc Af Amer 58 (L) >60 mL/min    Comment: (NOTE) The eGFR has been calculated using the CKD  EPI equation. This calculation has not been validated in all clinical situations. eGFR's persistently <60 mL/min signify possible Chronic Kidney Disease.    Anion gap 12 5 - 15    Comment: Performed at [facility], [address] [place], [place] [address]  Lipase, blood     Status: None   Collection Time: 01/08/18 12:56 AM  Result Value Ref Range   Lipase 23 11 - 51 U/L    Comment: Performed at [facility], [address] [place], [place] [address]  Troponin I - Once     Status: Abnormal   Collection Time: 01/08/18 12:56 AM  Result Value Ref Range   Troponin I 0.04 (HH) <0.03 ng/mL    Comment: CRITICAL RESULT CALLED TO, READ BACK BY AND VERIFIED WITH APRIL BRUMGARD AT 0136 ON 01/08/18 RWW Performed at [facility], [address] [place], [place] [address]   CBC with Differential     Status: Abnormal   Collection Time: 01/08/18 12:56 AM  Result Value Ref Range   WBC 12.7 (H) 4.0 - 10.5 K/uL   RBC 3.72 (L) 4.22 - 5.81 MIL/uL   Hemoglobin 12.0 (L) 13.0 - 17.0 g/dL   HCT 37.7 (L) 39.0 - 52.0 %   MCV 101.3 (H) 80.0 - 100.0 fL   MCH 32.3 26.0 - 34.0 pg   MCHC 31.8 30.0 - 36.0 g/dL   RDW 13.5 11.5 - 15.5 %   Platelets 202 150 - 400 K/uL   nRBC 0.0 0.0 - 0.2 %   Neutrophils Relative % 93 %   Neutro Abs 11.9 (H) 1.7 - 7.7 K/uL   Lymphocytes Relative 2 %   Lymphs Abs 0.2 (L) 0.7 - 4.0 K/uL   Monocytes Relative 3 %   Monocytes Absolute 0.4 0.1 - 1.0 K/uL   Eosinophils Relative 1 %   Eosinophils Absolute 0.1 0.0 - 0.5 K/uL   Basophils Relative 0 %   Basophils Absolute 0.0 0.0 - 0.1 K/uL   WBC Morphology MORPHOLOGY UNREMARKABLE    RBC Morphology MORPHOLOGY UNREMARKABLE    Smear Review MORPHOLOGY UNREMARKABLE    Immature Granulocytes 1 %   Abs Immature Granulocytes 0.09 (H) 0.00 - 0.07 K/uL    Comment: Performed at [facility], [address], [place], [place] [address]  Brain natriuretic peptide     Status: Abnormal   Collection Time: 01/08/18 12:57 AM  Result Value Ref Range   B Natriuretic Peptide 301.0 (H) 0.0 - 100.0 pg/mL    Comment: Performed at [facility], [address] [place], [place] [address]  Lactic acid, plasma     Status: Abnormal   Collection Time: 01/08/18  1:04 AM  Result Value Ref Range   Lactic Acid, Venous 4.1 (HH) 0.5 - 1.9 mmol/L    Comment: CRITICAL RESULT CALLED TO, READ BACK BY AND VERIFIED WITH APRIL BRUMGARD AT 0136 ON 01/08/18 RWW Performed at [facility], [address], [place], [place] [address]   Blood Culture (routine x 2)     Status: Abnormal   Collection Time: 01/08/18  1:44 AM  Result Value Ref Range   Specimen Description      BLOOD LEFT ANTECUBITAL Performed at [facility], [address] [place], [place] [address]    Special Requests      BOTTLES DRAWN AEROBIC AND ANAEROBIC Blood Culture adequate volume Performed at [facility], [address] [place],  [address]    Culture  Setup Time  Organism ID to follow IN BOTH AEROBIC AND ANAEROBIC BOTTLES GRAM NEGATIVE RODS CRITICAL RESULT CALLED TO, READ BACK BY AND VERIFIED WITH: [name] GRUBB AT 1950 01/08/18.PMH GRAM STAIN REVIEWED-AGREE WITH RESULT T. TYSOR  Performed at [facility], [place] [address] [place], [place] [address]    Culture KLEBSIELLA PNEUMONIAE (A)    Report Status 01/11/2018 FINAL    Organism ID, Bacteria KLEBSIELLA PNEUMONIAE       Susceptibility   Klebsiella pneumoniae - MIC*    AMPICILLIN RESISTANT Resistant     CEFAZOLIN <=4 SENSITIVE Sensitive     CEFEPIME <=1 SENSITIVE Sensitive     CEFTAZIDIME <=1 SENSITIVE Sensitive     CEFTRIAXONE <=1 SENSITIVE Sensitive     CIPROFLOXACIN <=0.25 SENSITIVE Sensitive     GENTAMICIN <=1 SENSITIVE Sensitive     IMIPENEM <=0.25 SENSITIVE Sensitive     TRIMETH/SULFA <=20  SENSITIVE Sensitive     AMPICILLIN/SULBACTAM <=2 SENSITIVE Sensitive     PIP/TAZO <=4 SENSITIVE Sensitive     Extended ESBL NEGATIVE Sensitive     * KLEBSIELLA PNEUMONIAE  Blood Culture ID Panel (Reflexed)     Status: Abnormal   Collection Time: 01/08/18  1:44 AM  Result Value Ref Range   Enterococcus species NOT DETECTED NOT DETECTED   Listeria monocytogenes NOT DETECTED NOT DETECTED   Staphylococcus species NOT DETECTED NOT DETECTED   Staphylococcus aureus (BCID) NOT DETECTED NOT DETECTED   Streptococcus species NOT DETECTED NOT DETECTED   Streptococcus agalactiae NOT DETECTED NOT DETECTED   Streptococcus pneumoniae NOT DETECTED NOT DETECTED   Streptococcus pyogenes NOT DETECTED NOT DETECTED   Acinetobacter baumannii NOT DETECTED NOT DETECTED   Enterobacteriaceae species DETECTED (A) NOT DETECTED    Comment: Enterobacteriaceae represent a large family of gram-negative bacteria, not a single organism. CRITICAL RESULT CALLED TO, READ BACK BY AND VERIFIED WITH: [name] GRUBB AT 1245 01/08/18.PMH    Enterobacter cloacae complex NOT DETECTED NOT DETECTED   Escherichia coli NOT DETECTED NOT DETECTED   Klebsiella oxytoca NOT DETECTED NOT DETECTED   Klebsiella pneumoniae DETECTED (A) NOT DETECTED    Comment: CRITICAL RESULT CALLED TO, READ BACK BY AND VERIFIED WITH: [name] GRUBB AT 1456 01/08/18.PMH    Proteus species NOT DETECTED NOT DETECTED   Serratia marcescens NOT DETECTED NOT DETECTED   Carbapenem resistance NOT DETECTED NOT DETECTED   Haemophilus influenzae NOT DETECTED NOT DETECTED   Neisseria meningitidis NOT DETECTED NOT DETECTED   Pseudomonas aeruginosa NOT DETECTED NOT DETECTED   Candida albicans NOT DETECTED NOT DETECTED   Candida glabrata NOT DETECTED NOT DETECTED   Candida krusei NOT DETECTED NOT DETECTED   Candida parapsilosis NOT DETECTED NOT DETECTED   Candida tropicalis NOT DETECTED NOT DETECTED    Comment: Performed at [facility], [address]  [place], [place] [address]  Blood Culture (routine x 2)     Status: Abnormal   Collection Time: 01/08/18  1:45 AM  Result Value Ref Range   Specimen Description      BLOOD BLOOD RIGHT HAND Performed at [facility], [address], [place], [place] [address]    Special Requests      BOTTLES DRAWN AEROBIC AND ANAEROBIC Blood Culture results may not be optimal due to an inadequate volume of blood received in culture bottles Performed at [facility], [address] [place], [place] [address]    Culture  Setup Time      GRAM NEGATIVE RODS AEROBIC BOTTLE ONLY CRITICAL VALUE NOTED.  VALUE  IS CONSISTENT WITH PREVIOUSLY REPORTED AND CALLED VALUE. Performed at [facility], [place], [place] [address]    Culture (A)     KLEBSIELLA PNEUMONIAE SUSCEPTIBILITIES PERFORMED ON PREVIOUS CULTURE WITHIN THE LAST 5 DAYS. Performed at [facility], [place] [address] [place], [place] [address]    Report Status 01/11/2018 FINAL   Lactic acid, plasma     Status: Abnormal   Collection Time: 01/08/18  2:52 AM  Result Value Ref Range   Lactic Acid, Venous 3.8 (HH) 0.5 - 1.9 mmol/L    Comment: CRITICAL RESULT CALLED TO, READ BACK BY AND VERIFIED WITH APRIL BRUMGARD ON 01/08/18 AT [facility] Performed at [facility], [address] [place], [place] [address]   Procalcitonin     Status: None   Collection Time: 01/08/18  2:52 AM  Result Value Ref Range   Procalcitonin 32.57 ng/mL    Comment:        Interpretation: PCT >= 10 ng/mL: Important systemic inflammatory response, almost exclusively due to severe bacterial sepsis or septic shock. (NOTE)       Sepsis PCT Algorithm           Lower Respiratory Tract                                      Infection PCT Algorithm    ----------------------------     ----------------------------         PCT < 0.25 ng/mL                PCT < 0.10 ng/mL         Strongly encourage             Strongly discourage    discontinuation of antibiotics    initiation of antibiotics    ----------------------------     -----------------------------       PCT 0.25 - 0.50 ng/mL            PCT 0.10 - 0.25 ng/mL               OR       >80% decrease in PCT            Discourage initiation of                                            antibiotics      Encourage discontinuation           of antibiotics    ----------------------------     -----------------------------         PCT >= 0.50 ng/mL              PCT 0.26 - 0.50 ng/mL                AND       <80% decrease in PCT             Encourage initiation of                                             antibiotics       Encourage continuation  of antibiotics    ----------------------------     -----------------------------        PCT >= 0.50 ng/mL                  PCT > 0.50 ng/mL               AND         increase in PCT                  Strongly encourage                                      initiation of antibiotics    Strongly encourage escalation           of antibiotics                                     -----------------------------                                           PCT <= 0.25 ng/mL                                                 OR                                        > 80% decrease in PCT                                     Discontinue / Do not initiate                                             antibiotics Performed at [facility], [address] [place], [place] [address]   Magnesium     Status: Abnormal   Collection Time: 01/08/18  2:52 AM  Result Value Ref Range   Magnesium 1.6 (L) 1.7 - 2.4 mg/dL    Comment: Performed at [facility], [address] [place], [place] [address]  Phosphorus     Status: None   Collection Time: 01/08/18  2:52 AM  Result Value Ref Range   Phosphorus 2.9 2.5 - 4.6 mg/dL    Comment: Performed at [facility], [address] [place], [place] [address]  Calcium,  ionized     Status: None   Collection Time: 01/08/18  2:52 AM  Result Value Ref Range   Calcium, Ionized, Serum 4.6 4.5 - 5.6 mg/dL    Comment: (NOTE) Performed At: [facility] [place], [place] [id number] Rush Farmer MD [id number]   Prealbumin     Status: Abnormal   Collection Time: 01/08/18  2:52 AM  Result Value Ref Range   Prealbumin 13.9 (L) 18 - 38 mg/dL    Comment:  Performed at [facility], [place] [address] [place], [place] [address]  Lactate dehydrogenase     Status: Abnormal   Collection Time: 01/08/18  2:52 AM  Result Value Ref Range   LDH 243 (H) 98 - 192 U/L    Comment: Performed at [facility], [address] [place], [place] [address]  Gamma GT     Status: Abnormal   Collection Time: 01/08/18  2:52 AM  Result Value Ref Range   GGT 74 (H) 7 - 50 U/L    Comment: Performed at [facility], [place] [address] [place], [place] [address]  Vitamin B12     Status: Abnormal   Collection Time: 01/08/18  2:52 AM  Result Value Ref Range   Vitamin B-12 1,081 (H) 180 - 914 pg/mL    Comment: (NOTE) This assay is not validated for testing neonatal or myeloproliferative syndrome specimens for Vitamin B12 levels. Performed at [facility], [place] [address], [place], [place] [address]   Folate     Status: None   Collection Time: 01/08/18  2:52 AM  Result Value Ref Range   Folate 34.0 >5.9 ng/mL    Comment: Performed at [facility], [address] [place], [place] [address]  Ammonia     Status: None   Collection Time: 01/08/18  3:09 AM  Result Value Ref Range   Ammonia 30 9 - 35 umol/L    Comment: HEMOLYSIS AT THIS LEVEL MAY AFFECT RESULT Performed at [facility], [address] [place], [place] [address]    Assessment/Plan: 1. Encounter for general adult medical examination with abnormal findings Annual health maintenance exam today.  2. Abnormal liver function Patient recently hospitalized  for obsructing bile duct stone. Liver functions severely elevated at time of admission. Will check BMP to ensure they have returned to normal.   3. S/P laparoscopic cholecystectomy Reviewed testing and hospital records with the patient. He is to schedule follow up appointment with surgeon.  4. Bradycardia Bradycardia present in hospital. History of MI in 1989. Metoprolol ER lowered to 12m daily. Will refer to local cardiology for continued evaluation and management.  - Ambulatory referral to Cardiology  5. Essential hypertension Update metoprolol ER dose to 239mdaily. - metoprolol succinate (TOPROL-XL) 25 MG 24 hr tablet; Take 2 tablets (50 mg total) by mouth daily.  Dispense: 90 tablet; Refill: 3  6. Cutaneous candidiasis - nystatin ointment (MYCOSTATIN); Apply 1 application topically 3 (three) times daily.  Dispense: 30 g; Refill: 2  7. Oral candidiasis - nystatin (MYCOSTATIN) 100000 UNIT/ML suspension; Take 5 mLs (500,000 Units total) by mouth 4 (four) times daily.  Dispense: 200 mL; Refill: 2  8. Atopic dermatitis, unspecified type - triamcinolone cream (KENALOG) 0.1 %; Apply 1 application topically 2 (two) times daily.  Dispense: 45 g; Refill: 3  9. Type 2 diabetes mellitus with complication, without long-term current use of insulin (HCBartonContinue regular visits with endocrinology.  General Counseling: Edtyquavious gamelnderstanding of the findings of todays visit and agrees with plan of treatment. I have discussed any further diagnostic evaluation that may be needed or ordered today. We also reviewed his medications today. he has been encouraged to call the office with any questions or concerns that should arise related to todays visit.    Counseling:  Hypertension Counseling:   The following hypertensive lifestyle modification were recommended and discussed:  1. Limiting alcohol intake to less than 1 oz/day of ethanol:(24 oz of beer  or 8 oz of wine or 2 oz of 100-proof  whiskey). 2. Take baby ASA 81 mg daily. 3. Importance of regular aerobic exercise and losing weight. 4. Reduce dietary saturated fat and cholesterol intake for overall cardiovascular health. 5. Maintaining adequate dietary potassium, calcium, and magnesium intake. 6. Regular monitoring of the blood pressure. 7. Reduce sodium intake to less than 100 mmol/day (less than 2.3 gm of sodium or less than 6 gm of sodium choride)   This patient was seen by [place] with Dr Lavera Guise as a part of collaborative care agreement  Orders Placed This Encounter  Procedures  . Ambulatory referral to Cardiology    Meds ordered this encounter  Medications  . metoprolol succinate (TOPROL-XL) 25 MG 24 hr tablet    Sig: Take 2 tablets (50 mg total) by mouth daily.    Dispense:  90 tablet    Refill:  3    Order Specific Question:   Supervising Provider    Answer:   Lavera Guise [[id number]]  . triamcinolone cream (KENALOG) 0.1 %    Sig: Apply 1 application topically 2 (two) times daily.    Dispense:  45 g    Refill:  3    Order Specific Question:   Supervising Provider    Answer:   Lavera Guise [[id number]]  . nystatin (MYCOSTATIN) 100000 UNIT/ML suspension    Sig: Take 5 mLs (500,000 Units total) by mouth 4 (four) times daily.    Dispense:  200 mL    Refill:  2    Order Specific Question:   Supervising Provider    Answer:   Lavera Guise [[id number]]  . nystatin ointment (MYCOSTATIN)    Sig: Apply 1 application topically 3 (three) times daily.    Dispense:  30 g    Refill:  2    Order Specific Question:   Supervising Provider    Answer:   Lavera Guise [[id number]]    Time spent: [place], MD  Internal Medicine

## 2018-01-17 ENCOUNTER — Ambulatory Visit: Payer: Self-pay | Admitting: Internal Medicine

## 2018-01-18 DIAGNOSIS — Z0001 Encounter for general adult medical examination with abnormal findings: Secondary | ICD-10-CM | POA: Insufficient documentation

## 2018-01-18 DIAGNOSIS — B370 Candidal stomatitis: Secondary | ICD-10-CM | POA: Insufficient documentation

## 2018-01-18 DIAGNOSIS — R001 Bradycardia, unspecified: Secondary | ICD-10-CM | POA: Insufficient documentation

## 2018-01-18 DIAGNOSIS — Z9049 Acquired absence of other specified parts of digestive tract: Secondary | ICD-10-CM | POA: Insufficient documentation

## 2018-01-18 DIAGNOSIS — R945 Abnormal results of liver function studies: Secondary | ICD-10-CM | POA: Insufficient documentation

## 2018-01-18 DIAGNOSIS — B372 Candidiasis of skin and nail: Secondary | ICD-10-CM | POA: Insufficient documentation

## 2018-01-19 ENCOUNTER — Other Ambulatory Visit: Payer: Self-pay | Admitting: Nurse Practitioner

## 2018-01-19 ENCOUNTER — Encounter: Payer: Self-pay | Admitting: Internal Medicine

## 2018-01-19 ENCOUNTER — Ambulatory Visit (INDEPENDENT_AMBULATORY_CARE_PROVIDER_SITE_OTHER): Payer: Medicare Other | Admitting: Internal Medicine

## 2018-01-19 VITALS — BP 142/52 | HR 62 | Resp 16 | Ht 69.0 in | Wt 209.0 lb

## 2018-01-19 DIAGNOSIS — Z9989 Dependence on other enabling machines and devices: Secondary | ICD-10-CM

## 2018-01-19 DIAGNOSIS — G4733 Obstructive sleep apnea (adult) (pediatric): Secondary | ICD-10-CM | POA: Diagnosis not present

## 2018-01-19 DIAGNOSIS — I10 Essential (primary) hypertension: Secondary | ICD-10-CM | POA: Diagnosis not present

## 2018-01-19 MED ORDER — METOPROLOL SUCCINATE ER 25 MG PO TB24: 25.0000 mg | ORAL_TABLET | Freq: Every day | ORAL | 1 refills | Status: DC

## 2018-01-19 NOTE — Progress Notes (Signed)
[facility] [place], [place] [address]  Pulmonary Sleep Medicine   Office Visit Note  Patient Name: Allen [name] DOB: [birth year] MRN [id number]  Date of Service: 01/19/2018  Complaints/HPI: Pt here for follow up on OSA.  Patient reports excellent symptom relief when using the CPAP.  He states that he wears it every night and even during the day if he sleeps.  He reports a good seal on his mask and is changing his filters as instructed.  He also is replacing tubing and cleaning his machine as instructed.  Patient denies chest pain, sinus pain or pressure, palpitations or shortness of breath.  He reports his excessive daytime fatigue and sleepiness seem to be controlled on CPAP.  He denies recent hospitalizations or other issues.  ROS  General: (-) fever, (-) chills, (-) night sweats, (-) weakness Skin: (-) rashes, (-) itching,. Eyes: (-) visual changes, (-) redness, (-) itching. Nose and Sinuses: (-) nasal stuffiness or itchiness, (-) postnasal drip, (-) nosebleeds, (-) sinus trouble. Mouth and Throat: (-) sore throat, (-) hoarseness. Neck: (-) swollen glands, (-) enlarged thyroid, (-) neck pain. Respiratory: - cough, (-) bloody sputum, - shortness of breath, - wheezing. Cardiovascular: - ankle swelling, (-) chest pain. Lymphatic: (-) lymph node enlargement. Neurologic: (-) numbness, (-) tingling. Psychiatric: (-) anxiety, (-) depression   Current Medication: Outpatient Encounter Medications as of 01/19/2018  Medication Sig Note  . aspirin 81 MG chewable tablet Chew by mouth daily.   Marland Kitchen b complex vitamins tablet Take 1 tablet by mouth daily.   . B-D ULTRA-FINE 33 LANCETS MISC Use 1 Lancet 3 (three) times daily. 11/24/2015: Received from: [facility]  . cetirizine (ZYRTEC) 10 MG tablet Take 10 mg by mouth daily.   . Cholecalciferol (VITAMIN D3) 1000 units CAPS Take by mouth.   . Continuous Blood Gluc Sensor (FREESTYLE LIBRE SENSOR SYSTEM) MISC  Use 3 each every 10 (ten) days.   . enalapril (VASOTEC) 20 MG tablet Take 1 tablet (20 mg total) by mouth daily.   Marland Kitchen ezetimibe-simvastatin (VYTORIN) 10-40 MG tablet Take 1 tablet by mouth daily.   Marland Kitchen FLUZONE HIGH-DOSE 0.5 ML injection ADM 0.5ML IM UTD   . folic acid (FOLVITE) 962 MCG tablet Take 400 mcg by mouth daily.   Marland Kitchen gabapentin (NEURONTIN) 300 MG capsule Take 300 mg by mouth 3 (three) times daily.   . Garlic 9528 MG CAPS Take by mouth.   Marland Kitchen glucose blood (ONE TOUCH ULTRA TEST) test strip Check blood sugar 5x a day as directed   . hydrochlorothiazide (HYDRODIURIL) 25 MG tablet Take 12.5 mg by mouth daily.    . insulin detemir (LEVEMIR) 100 UNIT/ML injection Inject into the skin at bedtime.   . Insulin Glargine (LANTUS SOLOSTAR) 100 UNIT/ML Solostar Pen Take 16u daily   . insulin lispro (HUMALOG) 100 UNIT/ML injection Inject into the skin 3 (three) times daily before meals.   . Insulin Pen Needle (BD PEN NEEDLE NANO U/F) 32G X 4 MM MISC USE AS DIRECTED FOUR TIMES A DAY WITH LEVEMIR FLEXTOUCH AND HUMALOG PEN.  DX CODE E11.29   . Insulin Syringe-Needle U-100 (BD INSULIN SYRINGE U/F) 31G X 5/16" 0.3 ML MISC Inject insulin [place] TID as indicated per sliding scale instuctions.   Marland Kitchen JARDIANCE 25 MG TABS tablet    . levothyroxine (SYNTHROID, LEVOTHROID) 50 MCG tablet Take 50 mcg by mouth daily before breakfast.   . metFORMIN (GLUCOPHAGE) 1000 MG tablet Take 1,000 mg by mouth 2 (two)  times daily with a meal.   . Multiple Vitamin (MULTI-VITAMINS) TABS Take by mouth.   . Multiple Vitamin (MULTIVITAMIN WITH MINERALS) TABS tablet Take 1 tablet by mouth daily.   . mupirocin ointment (BACTROBAN) 2 % Apply topically two times daily   . nystatin (MYCOSTATIN) 100000 UNIT/ML suspension Take 5 mLs (500,000 Units total) by mouth 4 (four) times daily.   Marland Kitchen nystatin ointment (MYCOSTATIN) Apply 1 application topically 3 (three) times daily.   . pioglitazone (ACTOS) 30 MG tablet Take 30 mg by mouth daily.    . traMADol  (ULTRAM) 50 MG tablet as needed. 11/24/2015: Received from: [facility]: 1-2 po bid prn  . triamcinolone cream (KENALOG) 0.1 % Apply 1 application topically 2 (two) times daily.   . vitamin B-12 (CYANOCOBALAMIN) 1000 MCG tablet Take 1,000 mcg by mouth daily.   . [DISCONTINUED] metoprolol succinate (TOPROL-XL) 25 MG 24 hr tablet Take 2 tablets (50 mg total) by mouth daily.   . [DISCONTINUED] metoprolol succinate (TOPROL-XL) 50 MG 24 hr tablet Take 50 mg by mouth daily. Take with or immediately following a meal.    No facility-administered encounter medications on file as of 01/19/2018.     Surgical History: Past Surgical History:  Procedure Laterality Date  . CARDIAC CATHETERIZATION    . GALLBLADDER SURGERY    . LEG SURGERY Right    stent placement    Medical History: Past Medical History:  Diagnosis Date  . Diabetes mellitus without complication ([place])   . Heart disease   . Hypertension     Family History: Family History  Problem Relation Age of Onset  . Diabetes Mother   . Brain cancer Mother   . Throat cancer Father   . Prostate cancer Brother     Social History: Social History   Socioeconomic History  . Marital status: Married    Spouse name: Not on file  . Number of children: Not on file  . Years of education: Not on file  . Highest education level: Not on file  Occupational History  . Not on file  Social Needs  . Financial resource strain: Not on file  . Food insecurity:    Worry: Not on file    Inability: Not on file  . Transportation needs:    Medical: Not on file    Non-medical: Not on file  Tobacco Use  . Smoking status: Former Smoker    Types: Cigarettes  . Smokeless tobacco: Never Used  Substance and Sexual Activity  . Alcohol use: No    Frequency: Never  . Drug use: No  . Sexual activity: Not on file  Lifestyle  . Physical activity:    Days per week: Not on file    Minutes per session: Not on file  . Stress:  Not on file  Relationships  . Social connections:    Talks on phone: Not on file    Gets together: Not on file    Attends religious service: Not on file    Active member of club or organization: Not on file    Attends meetings of clubs or organizations: Not on file    Relationship status: Not on file  . Intimate partner violence:    Fear of current or ex partner: Not on file    Emotionally abused: Not on file    Physically abused: Not on file    Forced sexual activity: Not on file  Other Topics Concern  . Not on file  Social History Narrative  . Not on file    Vital Signs: Blood pressure (!) 142/52, pulse 62, resp. rate 16, height 5' 9" (1.753 m), weight 209 lb (94.8 kg), SpO2 98 %.  Examination: General Appearance: The patient is well-developed, well-nourished, and in no distress. Skin: Gross inspection of skin unremarkable. Head: normocephalic, no gross deformities. Eyes: no gross deformities noted. ENT: ears appear grossly normal no exudates. Neck: Supple. No thyromegaly. No LAD. Respiratory: clear bilateraly. Cardiovascular: Normal S1 and S2 without murmur or rub. Extremities: No cyanosis. pulses are equal. Neurologic: Alert and oriented. No involuntary movements.  LABS: Recent Results (from the past 2160 hour(s))  Comprehensive metabolic panel     Status: Abnormal   Collection Time: 01/08/18 12:56 AM  Result Value Ref Range   Sodium 140 135 - 145 mmol/L   Potassium 3.1 (L) 3.5 - 5.1 mmol/L   Chloride 99 98 - 111 mmol/L   CO2 29 22 - 32 mmol/L   Glucose, Bld 134 (H) 70 - 99 mg/dL   BUN 21 8 - 23 mg/dL   Creatinine, Ser 1.34 (H) 0.61 - 1.24 mg/dL   Calcium 9.0 8.9 - 10.3 mg/dL   Total Protein 6.6 6.5 - 8.1 g/dL   Albumin 3.3 (L) 3.5 - 5.0 g/dL   AST 392 (H) 15 - 41 U/L   ALT 363 (H) 0 - 44 U/L   Alkaline Phosphatase 128 (H) 38 - 126 U/L   Total Bilirubin 2.9 (H) 0.3 - 1.2 mg/dL   GFR calc non Af Amer 50 (L) >60 mL/min   GFR calc Af Amer 58 (L) >60 mL/min     Comment: (NOTE) The eGFR has been calculated using the CKD EPI equation. This calculation has not been validated in all clinical situations. eGFR's persistently <60 mL/min signify possible Chronic Kidney Disease.    Anion gap 12 5 - 15    Comment: Performed at [facility], [address] [place], [place] [address]  Lipase, blood     Status: None   Collection Time: 01/08/18 12:56 AM  Result Value Ref Range   Lipase 23 11 - 51 U/L    Comment: Performed at [facility], [address] [place], [place] [address]  Troponin I - Once     Status: Abnormal   Collection Time: 01/08/18 12:56 AM  Result Value Ref Range   Troponin I 0.04 (HH) <0.03 ng/mL    Comment: CRITICAL RESULT CALLED TO, READ BACK BY AND VERIFIED WITH APRIL BRUMGARD AT 0136 ON 01/08/18 RWW Performed at [facility], [address] [place], [place] [address]   CBC with Differential     Status: Abnormal   Collection Time: 01/08/18 12:56 AM  Result Value Ref Range   WBC 12.7 (H) 4.0 - 10.5 K/uL   RBC 3.72 (L) 4.22 - 5.81 MIL/uL   Hemoglobin 12.0 (L) 13.0 - 17.0 g/dL   HCT 37.7 (L) 39.0 - 52.0 %   MCV 101.3 (H) 80.0 - 100.0 fL   MCH 32.3 26.0 - 34.0 pg   MCHC 31.8 30.0 - 36.0 g/dL   RDW 13.5 11.5 - 15.5 %   Platelets 202 150 - 400 K/uL   nRBC 0.0 0.0 - 0.2 %   Neutrophils Relative % 93 %   Neutro Abs 11.9 (H) 1.7 - 7.7 K/uL   Lymphocytes Relative 2 %   Lymphs Abs 0.2 (L) 0.7 - 4.0 K/uL   Monocytes Relative 3 %   Monocytes Absolute 0.4  0.1 - 1.0 K/uL   Eosinophils Relative 1 %   Eosinophils Absolute 0.1 0.0 - 0.5 K/uL   Basophils Relative 0 %   Basophils Absolute 0.0 0.0 - 0.1 K/uL   WBC Morphology MORPHOLOGY UNREMARKABLE    RBC Morphology MORPHOLOGY UNREMARKABLE    Smear Review MORPHOLOGY UNREMARKABLE    Immature Granulocytes 1 %   Abs Immature Granulocytes 0.09 (H) 0.00 - 0.07 K/uL    Comment: Performed at [facility] [address] [place], [place] [address]   Brain natriuretic peptide     Status: Abnormal   Collection Time: 01/08/18 12:57 AM  Result Value Ref Range   B Natriuretic Peptide 301.0 (H) 0.0 - 100.0 pg/mL    Comment: Performed at [facility], [address] [place], [place] [address]  Lactic acid, plasma     Status: Abnormal   Collection Time: 01/08/18  1:04 AM  Result Value Ref Range   Lactic Acid, Venous 4.1 (HH) 0.5 - 1.9 mmol/L    Comment: CRITICAL RESULT CALLED TO, READ BACK BY AND VERIFIED WITH APRIL BRUMGARD AT 0136 ON 01/08/18 RWW Performed at [facility], [address] [place], [place] [address]   Blood Culture (routine x 2)     Status: Abnormal   Collection Time: 01/08/18  1:44 AM  Result Value Ref Range   Specimen Description      BLOOD LEFT ANTECUBITAL Performed at [facility], [address] [place], [place] [address]    Special Requests      BOTTLES DRAWN AEROBIC AND ANAEROBIC Blood Culture adequate volume Performed at [facility], [address] [place], [place] [address]    Culture  Setup Time      Organism ID to follow IN [place] TO, READ BACK BY AND VERIFIED WITH: [name] GRUBB AT 3536 01/08/18.PMH GRAM STAIN REVIEWED-AGREE WITH RESULT T. TYSOR  Performed at [facility], [place] [address] [place], [place] [address]    Culture KLEBSIELLA PNEUMONIAE (A)    Report Status 01/11/2018 FINAL    Organism ID, Bacteria KLEBSIELLA PNEUMONIAE       Susceptibility   Klebsiella pneumoniae - MIC*    AMPICILLIN RESISTANT Resistant     CEFAZOLIN <=4 SENSITIVE Sensitive     CEFEPIME <=1 SENSITIVE Sensitive     CEFTAZIDIME <=1 SENSITIVE Sensitive     CEFTRIAXONE <=1 SENSITIVE Sensitive     CIPROFLOXACIN <=0.25 SENSITIVE Sensitive     GENTAMICIN <=1 SENSITIVE Sensitive     IMIPENEM <=0.25 SENSITIVE Sensitive     TRIMETH/SULFA <=20 SENSITIVE Sensitive     AMPICILLIN/SULBACTAM <=2 SENSITIVE  Sensitive     PIP/TAZO <=4 SENSITIVE Sensitive     Extended ESBL NEGATIVE Sensitive     * KLEBSIELLA PNEUMONIAE  Blood Culture ID Panel (Reflexed)     Status: Abnormal   Collection Time: 01/08/18  1:44 AM  Result Value Ref Range   Enterococcus species NOT DETECTED NOT DETECTED   Listeria monocytogenes NOT DETECTED NOT DETECTED   Staphylococcus species NOT DETECTED NOT DETECTED   Staphylococcus aureus (BCID) NOT DETECTED NOT DETECTED   Streptococcus species NOT DETECTED NOT DETECTED   Streptococcus agalactiae NOT DETECTED NOT DETECTED   Streptococcus pneumoniae NOT DETECTED NOT DETECTED   Streptococcus pyogenes NOT DETECTED NOT DETECTED   Acinetobacter baumannii NOT DETECTED NOT DETECTED   Enterobacteriaceae species DETECTED (A) NOT DETECTED    Comment: Enterobacteriaceae represent a large family of  gram-negative bacteria, not a single organism. CRITICAL RESULT CALLED TO, READ BACK BY AND VERIFIED WITH: [name] GRUBB AT 3335 01/08/18.PMH    Enterobacter cloacae complex NOT DETECTED NOT DETECTED   Escherichia coli NOT DETECTED NOT DETECTED   Klebsiella oxytoca NOT DETECTED NOT DETECTED   Klebsiella pneumoniae DETECTED (A) NOT DETECTED    Comment: CRITICAL RESULT CALLED TO, READ BACK BY AND VERIFIED WITH: [name] GRUBB AT 1456 01/08/18.PMH    Proteus species NOT DETECTED NOT DETECTED   Serratia marcescens NOT DETECTED NOT DETECTED   Carbapenem resistance NOT DETECTED NOT DETECTED   Haemophilus influenzae NOT DETECTED NOT DETECTED   Neisseria meningitidis NOT DETECTED NOT DETECTED   Pseudomonas aeruginosa NOT DETECTED NOT DETECTED   Candida albicans NOT DETECTED NOT DETECTED   Candida glabrata NOT DETECTED NOT DETECTED   Candida krusei NOT DETECTED NOT DETECTED   Candida parapsilosis NOT DETECTED NOT DETECTED   Candida tropicalis NOT DETECTED NOT DETECTED    Comment: Performed at [facility], [address] [place], [place] [address]  Blood Culture (routine x 2)      Status: Abnormal   Collection Time: 01/08/18  1:45 AM  Result Value Ref Range   Specimen Description      BLOOD BLOOD RIGHT HAND Performed at [facility], [address], [place], [place] [address]    Special Requests      BOTTLES DRAWN AEROBIC AND ANAEROBIC Blood Culture results may not be optimal due to an inadequate volume of blood received in culture bottles Performed at [facility] [address] [place], [place] [address]    Culture  Setup Time      GRAM NEGATIVE RODS AEROBIC BOTTLE ONLY CRITICAL VALUE NOTED.  VALUE IS CONSISTENT WITH PREVIOUSLY REPORTED AND CALLED VALUE. Performed at [facility], [place], [place] [address]    Culture (A)     KLEBSIELLA PNEUMONIAE SUSCEPTIBILITIES PERFORMED ON PREVIOUS CULTURE WITHIN THE LAST 5 DAYS. Performed at [facility], [place] [address], [place], [place] [address]    Report Status 01/11/2018 FINAL   Lactic acid, plasma     Status: Abnormal   Collection Time: 01/08/18  2:52 AM  Result Value Ref Range   Lactic Acid, Venous 3.8 (HH) 0.5 - 1.9 mmol/L    Comment: CRITICAL RESULT CALLED TO, READ BACK BY AND VERIFIED WITH APRIL BRUMGARD ON 01/08/18 AT [facility] Performed at [facility], [address] [place],  [address]   Procalcitonin     Status: None   Collection Time: 01/08/18  2:52 AM  Result Value Ref Range   Procalcitonin 32.57 ng/mL    Comment:        Interpretation: PCT >= 10 ng/mL: Important systemic inflammatory response, almost exclusively due to severe bacterial sepsis or septic shock. (NOTE)       Sepsis PCT Algorithm           Lower Respiratory Tract                                      Infection PCT Algorithm    ----------------------------     ----------------------------         PCT < 0.25 ng/mL                PCT < 0.10 ng/mL         Strongly encourage  Strongly discourage   discontinuation of antibiotics    initiation of  antibiotics    ----------------------------     -----------------------------       PCT 0.25 - 0.50 ng/mL            PCT 0.10 - 0.25 ng/mL               OR       >80% decrease in PCT            Discourage initiation of                                            antibiotics      Encourage discontinuation           of antibiotics    ----------------------------     -----------------------------         PCT >= 0.50 ng/mL              PCT 0.26 - 0.50 ng/mL                AND       <80% decrease in PCT             Encourage initiation of                                             antibiotics       Encourage continuation           of antibiotics    ----------------------------     -----------------------------        PCT >= 0.50 ng/mL                  PCT > 0.50 ng/mL               AND         increase in PCT                  Strongly encourage                                      initiation of antibiotics    Strongly encourage escalation           of antibiotics                                     -----------------------------                                           PCT <= 0.25 ng/mL                                                 OR                                        >  80% decrease in PCT                                     Discontinue / Do not initiate                                             antibiotics Performed at [facility], [address] [place], [place] [address]   Magnesium     Status: Abnormal   Collection Time: 01/08/18  2:52 AM  Result Value Ref Range   Magnesium 1.6 (L) 1.7 - 2.4 mg/dL    Comment: Performed at [facility], [address] [place], [place] [address]  Phosphorus     Status: None   Collection Time: 01/08/18  2:52 AM  Result Value Ref Range   Phosphorus 2.9 2.5 - 4.6 mg/dL    Comment: Performed at [facility], [address] [place], [place] [address]  Calcium, ionized     Status: None   Collection Time:  01/08/18  2:52 AM  Result Value Ref Range   Calcium, Ionized, Serum 4.6 4.5 - 5.6 mg/dL    Comment: (NOTE) Performed At: [facility] [place], [place] [id number] Rush Farmer MD VX:[id number]   Prealbumin     Status: Abnormal   Collection Time: 01/08/18  2:52 AM  Result Value Ref Range   Prealbumin 13.9 (L) 18 - 38 mg/dL    Comment: Performed at [facility], [place] [address], [place], [place] [address]  Lactate dehydrogenase     Status: Abnormal   Collection Time: 01/08/18  2:52 AM  Result Value Ref Range   LDH 243 (H) 98 - 192 U/L    Comment: Performed at [facility], [address] [place], [place] [address]  Gamma GT     Status: Abnormal   Collection Time: 01/08/18  2:52 AM  Result Value Ref Range   GGT 74 (H) 7 - 50 U/L    Comment: Performed at [facility], [place] [address], [place], [place] [address]  Vitamin B12     Status: Abnormal   Collection Time: 01/08/18  2:52 AM  Result Value Ref Range   Vitamin B-12 1,081 (H) 180 - 914 pg/mL    Comment: (NOTE) This assay is not validated for testing neonatal or myeloproliferative syndrome specimens for Vitamin B12 levels. Performed at [facility], [place] [address] [place], [place] [address]   Folate     Status: None   Collection Time: 01/08/18  2:52 AM  Result Value Ref Range   Folate 34.0 >5.9 ng/mL    Comment: Performed at [facility], [address] [place], [place] [address]  Ammonia     Status: None   Collection Time: 01/08/18  3:09 AM  Result Value Ref Range   Ammonia 30 9 - 35 umol/L    Comment: HEMOLYSIS AT THIS LEVEL MAY AFFECT RESULT Performed at [facility], [address], [place],  [address]     Radiology: Dg Chest 2 View  Result Date: 01/08/2018 CLINICAL DATA:  Fever yesterday. Vomiting today. Dizziness and chest pain. EXAM: CHEST - 2 VIEW COMPARISON:  07/06/2015 FINDINGS: Normal heart size and pulmonary vascularity. No  focal airspace disease or consolidation in the  lungs. No blunting of costophrenic angles. No pneumothorax. Mediastinal contours appear intact. Calcification of the aorta. Degenerative changes in the spine and shoulders. IMPRESSION: No active cardiopulmonary disease. Electronically Signed   By: Lucienne Capers M.D.   On: 01/08/2018 01:35   Ct Angio Chest/abd/pel For Dissection W And/or W/wo  Result Date: 01/08/2018 CLINICAL DATA:  Chest pain, upper abdominal pain with nausea and vomiting. Fever. EXAM: CT ANGIOGRAPHY CHEST, ABDOMEN AND PELVIS TECHNIQUE: Multidetector CT imaging through the chest, abdomen and pelvis was performed using the standard protocol during bolus administration of intravenous contrast. Multiplanar reconstructed images and MIPs were obtained and reviewed to evaluate the vascular anatomy. CONTRAST:  130m ISOVUE-370 IOPAMIDOL (ISOVUE-370) INJECTION 76% COMPARISON:  None. FINDINGS: CTA CHEST FINDINGS Cardiovascular: No thoracic aortic aneurysm or thoracic aortic dissection. Scattered aortic atherosclerosis. Mild cardiomegaly. Three-vessel coronary artery calcifications. No pericardial effusion. No pulmonary embolism identified. Mediastinum/Nodes: No mass or enlarged lymph nodes seen within the mediastinum or perihilar regions. Esophagus appears normal. Trachea and central bronchi are unremarkable. Lungs/Pleura: Lungs are clear. No pleural effusion or pneumothorax. Musculoskeletal: No acute or suspicious osseous finding. Degenerative spondylosis throughout the thoracic spine, mild to moderate in degree. Review of the MIP images confirms the above findings. CTA ABDOMEN AND PELVIS FINDINGS VASCULAR Aorta: Normal caliber aorta without aneurysm, dissection, vasculitis or significant stenosis. Fairly extensive aortic atherosclerosis. Celiac: Celiac artery trunk is patent without evidence of aneurysm, dissection, vasculitis or significant stenosis. Normal contrast flow is seen to the RIGHT and LEFT  hepatic arteries, although perhaps slightly diminished to the LEFT liver lobe. Normal contrast flow is seen to the splenic artery. SMA: Patent without evidence of aneurysm, dissection, vasculitis or significant stenosis. Renals: Both renal arteries are patent without evidence of aneurysm, dissection, vasculitis, fibromuscular dysplasia or significant stenosis. IMA: Patent without evidence of aneurysm, dissection, vasculitis or significant stenosis. Inflow: Patent without evidence of aneurysm, dissection, vasculitis or significant stenosis. Veins: No obvious venous abnormality within the limitations of this arterial phase study. However, the main portal vein is prominent, measuring 1.9 cm diameter. Review of the MIP images confirms the above findings. NON-VASCULAR Hepatobiliary: Relatively diminished contrast enhancement of the LEFT liver lobe, compared to the RIGHT hepatic lobe. No focal mass or lesion within either liver lobe. Gallbladder walls appear thickened and there is at least mild pericholecystic edema suggesting acute cholecystitis. Common bile duct dilatation. 3 mm calcific density at the level of the distal common bile duct, highly suspicious for obstructing CBD stone. Pancreas: Unremarkable. No pancreatic ductal dilatation or surrounding inflammatory changes. Spleen: Normal in size without focal abnormality. Adrenals/Urinary Tract: Adrenal glands appear normal. Kidneys are unremarkable without mass, stone or hydronephrosis. Bladder is unremarkable, partially decompressed. Stomach/Bowel: No dilated large or small bowel loops. No evidence of bowel wall inflammation. Appendix is normal. Mild diverticulosis of the sigmoid and descending colon without evidence of acute diverticulitis. Stomach is unremarkable, partially decompressed. Lymphatic: No enlarged lymph nodes seen in the abdomen or pelvis. Partially calcified structures within the upper abdomen are suspected to represent partially calcified lymph  nodes indicating previous granulomatous infection, or chronic partially calcified blood vessel collateral/varices. Reproductive: Prostate is unremarkable. Other: No abscess collection. No free intraperitoneal air. Musculoskeletal: No acute or suspicious osseous finding. Degenerative changes within the mid and lower lumbar spine, moderate in degree. Review of the MIP images confirms the above findings. IMPRESSION: 1. Common bile duct dilatation, measuring approximately 10 mm diameter. 3 mm calcific density at the level of the distal common bile duct, compatible with  obstructing CBD stone. 2. Gallbladder walls appear thickened and there is at least mild pericholecystic edema suggesting an associated acute cholecystitis and/or cholangitis. 3. Relatively diminished contrast enhancement of the LEFT liver lobe, compared to the normal enhancement of the RIGHT hepatic lobe, likely delayed enhancement as there is normal contrast opacification of the LEFT hepatic artery branches, possibly related to shunting given the aforementioned cholecystitis/cholangitis. Recommend Doppler ultrasound of the portal vein to exclude the less likely possibility of portal vein thrombosis as a cause for this asymmetrically diminished enhancement. 4. Mild colonic diverticulosis without evidence of acute diverticulitis. 5. Coronary artery calcifications, particularly dense within the LEFT main and proximal LEFT anterior descending coronary arteries. Recommend correlation with any possible associated cardiac symptoms. 6. Mild cardiomegaly. Aortic Atherosclerosis (ICD10-I70.0). These results, considerations and recommendations were called by telephone at the time of interpretation on 01/08/2018 at 2:45 am to Dr. Darel Hong , who verbally acknowledged these results. Electronically Signed   By: Franki Cabot M.D.   On: 01/08/2018 02:52   US Abdomen Limited Ruq  Result Date: 01/08/2018 CLINICAL DATA:  Right upper quadrant pain. EXAM:  ULTRASOUND ABDOMEN LIMITED RIGHT UPPER QUADRANT COMPARISON:  None. FINDINGS: Gallbladder: Mildly thickened gallbladder wall with focal pericholecystic fluid and layering sludge. No discrete stones identified. Murphy's sign is negative. Common bile duct: Diameter: 8.8 mm, mildly dilated.  No filling defects identified. Liver: No focal lesion identified. Within normal limits in parenchymal echogenicity. Portal vein is patent on color Doppler imaging with normal direction of blood flow towards the liver. IMPRESSION: Gallbladder sludge, focal pericholecystic edema, and mild gallbladder wall thickening. Murphy's sign negative. Possibly chronic inflammation or stasis. Mild extrahepatic bile duct dilatation. No cause identified. Correlate with liver function studies. Electronically Signed   By: Lucienne Capers M.D.   On: 01/08/2018 03:29    No results found.  Dg Chest 2 View  Result Date: 01/08/2018 CLINICAL DATA:  Fever yesterday. Vomiting today. Dizziness and chest pain. EXAM: CHEST - 2 VIEW COMPARISON:  07/06/2015 FINDINGS: Normal heart size and pulmonary vascularity. No focal airspace disease or consolidation in the lungs. No blunting of costophrenic angles. No pneumothorax. Mediastinal contours appear intact. Calcification of the aorta. Degenerative changes in the spine and shoulders. IMPRESSION: No active cardiopulmonary disease. Electronically Signed   By: Lucienne Capers M.D.   On: 01/08/2018 01:35   Ct Angio Chest/abd/pel For Dissection W And/or W/wo  Result Date: 01/08/2018 CLINICAL DATA:  Chest pain, upper abdominal pain with nausea and vomiting. Fever. EXAM: CT ANGIOGRAPHY CHEST, ABDOMEN AND PELVIS TECHNIQUE: Multidetector CT imaging through the chest, abdomen and pelvis was performed using the standard protocol during bolus administration of intravenous contrast. Multiplanar reconstructed images and MIPs were obtained and reviewed to evaluate the vascular anatomy. CONTRAST:  167m ISOVUE-370  IOPAMIDOL (ISOVUE-370) INJECTION 76% COMPARISON:  None. FINDINGS: CTA CHEST FINDINGS Cardiovascular: No thoracic aortic aneurysm or thoracic aortic dissection. Scattered aortic atherosclerosis. Mild cardiomegaly. Three-vessel coronary artery calcifications. No pericardial effusion. No pulmonary embolism identified. Mediastinum/Nodes: No mass or enlarged lymph nodes seen within the mediastinum or perihilar regions. Esophagus appears normal. Trachea and central bronchi are unremarkable. Lungs/Pleura: Lungs are clear. No pleural effusion or pneumothorax. Musculoskeletal: No acute or suspicious osseous finding. Degenerative spondylosis throughout the thoracic spine, mild to moderate in degree. Review of the MIP images confirms the above findings. CTA ABDOMEN AND PELVIS FINDINGS VASCULAR Aorta: Normal caliber aorta without aneurysm, dissection, vasculitis or significant stenosis. Fairly extensive aortic atherosclerosis. Celiac: Celiac artery trunk is patent without evidence  of aneurysm, dissection, vasculitis or significant stenosis. Normal contrast flow is seen to the RIGHT and LEFT hepatic arteries, although perhaps slightly diminished to the LEFT liver lobe. Normal contrast flow is seen to the splenic artery. SMA: Patent without evidence of aneurysm, dissection, vasculitis or significant stenosis. Renals: Both renal arteries are patent without evidence of aneurysm, dissection, vasculitis, fibromuscular dysplasia or significant stenosis. IMA: Patent without evidence of aneurysm, dissection, vasculitis or significant stenosis. Inflow: Patent without evidence of aneurysm, dissection, vasculitis or significant stenosis. Veins: No obvious venous abnormality within the limitations of this arterial phase study. However, the main portal vein is prominent, measuring 1.9 cm diameter. Review of the MIP images confirms the above findings. NON-VASCULAR Hepatobiliary: Relatively diminished contrast enhancement of the LEFT liver  lobe, compared to the RIGHT hepatic lobe. No focal mass or lesion within either liver lobe. Gallbladder walls appear thickened and there is at least mild pericholecystic edema suggesting acute cholecystitis. Common bile duct dilatation. 3 mm calcific density at the level of the distal common bile duct, highly suspicious for obstructing CBD stone. Pancreas: Unremarkable. No pancreatic ductal dilatation or surrounding inflammatory changes. Spleen: Normal in size without focal abnormality. Adrenals/Urinary Tract: Adrenal glands appear normal. Kidneys are unremarkable without mass, stone or hydronephrosis. Bladder is unremarkable, partially decompressed. Stomach/Bowel: No dilated large or small bowel loops. No evidence of bowel wall inflammation. Appendix is normal. Mild diverticulosis of the sigmoid and descending colon without evidence of acute diverticulitis. Stomach is unremarkable, partially decompressed. Lymphatic: No enlarged lymph nodes seen in the abdomen or pelvis. Partially calcified structures within the upper abdomen are suspected to represent partially calcified lymph nodes indicating previous granulomatous infection, or chronic partially calcified blood vessel collateral/varices. Reproductive: Prostate is unremarkable. Other: No abscess collection. No free intraperitoneal air. Musculoskeletal: No acute or suspicious osseous finding. Degenerative changes within the mid and lower lumbar spine, moderate in degree. Review of the MIP images confirms the above findings. IMPRESSION: 1. Common bile duct dilatation, measuring approximately 10 mm diameter. 3 mm calcific density at the level of the distal common bile duct, compatible with obstructing CBD stone. 2. Gallbladder walls appear thickened and there is at least mild pericholecystic edema suggesting an associated acute cholecystitis and/or cholangitis. 3. Relatively diminished contrast enhancement of the LEFT liver lobe, compared to the normal enhancement of  the RIGHT hepatic lobe, likely delayed enhancement as there is normal contrast opacification of the LEFT hepatic artery branches, possibly related to shunting given the aforementioned cholecystitis/cholangitis. Recommend Doppler ultrasound of the portal vein to exclude the less likely possibility of portal vein thrombosis as a cause for this asymmetrically diminished enhancement. 4. Mild colonic diverticulosis without evidence of acute diverticulitis. 5. Coronary artery calcifications, particularly dense within the LEFT main and proximal LEFT anterior descending coronary arteries. Recommend correlation with any possible associated cardiac symptoms. 6. Mild cardiomegaly. Aortic Atherosclerosis (ICD10-I70.0). These results, considerations and recommendations were called by telephone at the time of interpretation on 01/08/2018 at 2:45 am to Dr. Darel Hong , who verbally acknowledged these results. Electronically Signed   By: Franki Cabot M.D.   On: 01/08/2018 02:52   US Abdomen Limited Ruq  Result Date: 01/08/2018 CLINICAL DATA:  Right upper quadrant pain. EXAM: ULTRASOUND ABDOMEN LIMITED RIGHT UPPER QUADRANT COMPARISON:  None. FINDINGS: Gallbladder: Mildly thickened gallbladder wall with focal pericholecystic fluid and layering sludge. No discrete stones identified. Murphy's sign is negative. Common bile duct: Diameter: 8.8 mm, mildly dilated.  No filling defects identified. Liver: No focal lesion identified.  Within normal limits in parenchymal echogenicity. Portal vein is patent on color Doppler imaging with normal direction of blood flow towards the liver. IMPRESSION: Gallbladder sludge, focal pericholecystic edema, and mild gallbladder wall thickening. Murphy's sign negative. Possibly chronic inflammation or stasis. Mild extrahepatic bile duct dilatation. No cause identified. Correlate with liver function studies. Electronically Signed   By: Lucienne Capers M.D.   On: 01/08/2018 03:29       Assessment and Plan: Patient Active Problem List   Diagnosis Date Noted  . Encounter for general adult medical examination with abnormal findings 01/18/2018  . Abnormal liver function 01/18/2018  . S/P laparoscopic cholecystectomy 01/18/2018  . Bradycardia 01/18/2018  . Cutaneous candidiasis 01/18/2018  . Oral candidiasis 01/18/2018  . Septic shock ([place]) 01/08/2018  . Insect bite of right lower leg 09/25/2017  . Atopic dermatitis 09/25/2017  . PAD (peripheral artery disease) ([place]) 11/24/2015  . Type 2 diabetes mellitus with complication, without long-term current use of insulin ([place]) 11/24/2015  . Hyperlipidemia 11/24/2015  . Essential hypertension 11/24/2015  . PVD (peripheral vascular disease) ([place]) 10/29/2015   1. OSA on CPAP Encouraged patient to continue to use CPAP as directed.  Patient verbalized understanding of importance of sleeping with machine.  2. Essential hypertension Slightly elevated at today's visit, will continue to monitor in the future.  3. Morbid obesity ([place]) Obesity Counseling: Risk Assessment: An assessment of behavioral risk factors was made today and includes lack of exercise sedentary lifestyle, lack of portion control and poor dietary habits.  Risk Modification Advice: She was counseled on portion control guidelines. Restricting daily caloric intake to. . The detrimental long term effects of obesity on her health and ongoing poor compliance was also discussed with the patient.     General Counseling: I have discussed the findings of the evaluation and examination with Effie Shy.  I have also discussed any further diagnostic evaluation thatmay be needed or ordered today. Federico verbalizes understanding of the findings of todays visit. We also reviewed his medications today and discussed drug interactions and side effects including but not limited excessive drowsiness and altered mental states. We also discussed that there is always a risk not just to  him but also people around him. he has been encouraged to call the office with any questions or concerns that should arise related to todays visit.    Time spent: 25 This patient was seen by Orson Gear AGNP-C in Collaboration with Dr. Devona Konig as a part of collaborative care agreement.   I have personally obtained a history, examined the patient, evaluated laboratory and imaging results, formulated the assessment and plan and placed orders.    Allyne Gee, MD [facility] Sleep medicine

## 2018-01-23 ENCOUNTER — Other Ambulatory Visit: Payer: Self-pay | Admitting: Nurse Practitioner

## 2018-01-23 DIAGNOSIS — R945 Abnormal results of liver function studies: Secondary | ICD-10-CM | POA: Diagnosis not present

## 2018-01-24 ENCOUNTER — Ambulatory Visit: Payer: Self-pay | Admitting: Internal Medicine

## 2018-01-24 LAB — COMPREHENSIVE METABOLIC PANEL
ALT: 30 IU/L (ref 0–44)
AST: 28 IU/L (ref 0–40)
Albumin/Globulin Ratio: 1.5 (ref 1.2–2.2)
Albumin: 3.6 g/dL (ref 3.5–4.8)
Alkaline Phosphatase: 111 IU/L (ref 39–117)
BUN/Creatinine Ratio: 14 (ref 10–24)
BUN: 15 mg/dL (ref 8–27)
Bilirubin Total: 0.6 mg/dL (ref 0.0–1.2)
CO2: 25 mmol/L (ref 20–29)
CREATININE: 1.08 mg/dL (ref 0.76–1.27)
Calcium: 9.3 mg/dL (ref 8.6–10.2)
Chloride: 100 mmol/L (ref 96–106)
GFR calc Af Amer: 77 mL/min/{1.73_m2} (ref >59)
GFR, EST NON AFRICAN AMERICAN: 67 mL/min/{1.73_m2} (ref >59)
GLOBULIN, TOTAL: 2.4 g/dL (ref 1.5–4.5)
Glucose: 192 mg/dL — ABNORMAL HIGH (ref 65–99)
Potassium: 4.4 mmol/L (ref 3.5–5.2)
SODIUM: 139 mmol/L (ref 134–144)
Total Protein: 6 g/dL (ref 6.0–8.5)

## 2018-01-26 ENCOUNTER — Telehealth: Payer: Self-pay

## 2018-01-26 NOTE — Telephone Encounter (Signed)
Pt advised labs for liver functions came back normal

## 2018-02-16 ENCOUNTER — Encounter

## 2018-02-16 ENCOUNTER — Ambulatory Visit (INDEPENDENT_AMBULATORY_CARE_PROVIDER_SITE_OTHER): Payer: Medicare Other | Admitting: Cardiovascular Disease

## 2018-02-16 ENCOUNTER — Encounter: Payer: Self-pay | Admitting: Cardiovascular Disease

## 2018-02-16 VITALS — BP 134/72 | HR 61 | Ht 68.0 in | Wt 205.5 lb

## 2018-02-16 DIAGNOSIS — R001 Bradycardia, unspecified: Secondary | ICD-10-CM | POA: Diagnosis not present

## 2018-02-16 DIAGNOSIS — I10 Essential (primary) hypertension: Secondary | ICD-10-CM

## 2018-02-16 DIAGNOSIS — I2510 Atherosclerotic heart disease of native coronary artery without angina pectoris: Secondary | ICD-10-CM | POA: Diagnosis not present

## 2018-02-16 DIAGNOSIS — E785 Hyperlipidemia, unspecified: Secondary | ICD-10-CM

## 2018-02-16 NOTE — Patient Instructions (Signed)
Medication Instructions:  No changes  If you need a refill on your cardiac medications before your next appointment, please call your pharmacy.   Lab work: None ordered  Testing/Procedures: None ordered  Follow-Up: At [facility], you and your health needs are our priority.  As part of our continuing mission to provide you with exceptional heart care, we have created designated Provider Care Teams.  These Care Teams include your primary Cardiologist (physician) and Advanced Practice Providers (APPs -  Physician Assistants and Nurse Practitioners) who all work together to provide you with the care you need, when you need it. You will need a follow up appointment in 12 months.  Please call our office 2 months in advance to schedule this appointment.  You may see Dr. Fletcher Anon or one of the following Advanced Practice Providers on your designated Care Team:   Murray Hodgkins, NP Christell Faith, PA-C . Marrianne Mood, PA-C

## 2018-02-16 NOTE — Progress Notes (Signed)
Cardiology Office Note   Date:  02/16/2018   ID:  Allen [name], DOB 1940/12/30, MRN [id number]  PCP:  Ronnell Freshwater, NP  Cardiologist:   [name], MD   Chief Complaint  Patient presents with  . OTHER    Bradycardia pt would like to review [facility] echo results. Meds reviewed verbally with pt.      History of Present Illness: Allen [name] is a 78 y.o. male who was referred by Leretha Pol for evaluation of bradycardia and to establish cardiovascular care.  The patient has known history of coronary artery disease with previous myocardial infarction in 1989.  He was treated with angioplasty at [facility] but then had a repeat cardiac catheterization 6 months later that showed occluded vessel with collaterals.  He was treated medically and since then has not had any ischemic cardiac events.  He has prolonged history of diabetes mellitus in addition to hypertension, hyperlipidemia, obesity and sleep apnea on CPAP.  He also has peripheral arterial disease but mostly asymptomatic from that. He was hospitalized in November with sepsis due to cholecystitis.  He underwent cholecystectomy and was treated with antibiotics.  He was noted there to be bradycardic at night with lowest heart rate of 39 bpm.  The dose of Toprol was decreased in half.  The patient feels well now with no chest pain, shortness of breath or palpitations.  He denies syncope or presyncope.  He has mild orthostatic dizziness.    Past Medical History:  Diagnosis Date  . Coronary artery disease   . Diabetes mellitus without complication ([place])   . Heart disease   . Hypertension   . MI (myocardial infarction) [facility])     Past Surgical History:  Procedure Laterality Date  . CARDIAC CATHETERIZATION  1989  . CARDIAC CATHETERIZATION  1990  . GALLBLADDER SURGERY    . LEG SURGERY Right    stent placement     Current Outpatient Medications  Medication Sig Dispense Refill  . aspirin 81 MG chewable tablet Chew by mouth daily.      Marland Kitchen b complex vitamins tablet Take 1 tablet by mouth daily.    . B-D ULTRA-FINE 33 LANCETS MISC Use 1 Lancet 3 (three) times daily.    . cetirizine (ZYRTEC) 10 MG tablet Take 10 mg by mouth daily.    . Cholecalciferol (VITAMIN D3) 1000 units CAPS Take by mouth.    . Continuous Blood Gluc Sensor (FREESTYLE LIBRE SENSOR SYSTEM) MISC Use 3 each every 10 (ten) days.    . enalapril (VASOTEC) 20 MG tablet Take 1 tablet (20 mg total) by mouth daily. 90 tablet 1  . ezetimibe-simvastatin (VYTORIN) 10-40 MG tablet Take 1 tablet by mouth daily. 90 tablet 2  . FLUZONE HIGH-DOSE 0.5 ML injection ADM 6.9SW IM UTD  0  . folic acid (FOLVITE) 546 MCG tablet Take 400 mcg by mouth daily.    . Garlic 2703 MG CAPS Take by mouth.    Marland Kitchen glucose blood (ONE TOUCH ULTRA TEST) test strip Check blood sugar 5x a day as directed    . hydrochlorothiazide (MICROZIDE) 12.5 MG capsule Take 12.5 mg by mouth daily.    . insulin lispro (HUMALOG) 100 UNIT/ML injection Inject 6-34 Units into the skin 3 (three) times daily before meals.     . Insulin Pen Needle (BD PEN NEEDLE NANO U/F) 32G X 4 MM MISC USE AS DIRECTED FOUR TIMES A DAY WITH LEVEMIR FLEXTOUCH AND HUMALOG PEN.  DX CODE E11.29    .  Insulin Syringe-Needle U-100 (BD INSULIN SYRINGE U/F) 31G X 5/16" 0.3 ML MISC Inject insulin [place]ons TID as indicated per sliding scale instuctions. 90 each 3  . JARDIANCE 25 MG TABS tablet Take 0.5 mg by mouth daily.   4  . levothyroxine (SYNTHROID, LEVOTHROID) 50 MCG tablet Take 50 mcg by mouth daily before breakfast.    . metFORMIN (GLUCOPHAGE) 1000 MG tablet Take 1,000 mg by mouth daily with breakfast.     . metFORMIN (GLUCOPHAGE) 500 MG tablet Take 1,000 mg by mouth daily with breakfast.    . metoprolol succinate (TOPROL-XL) 25 MG 24 hr tablet Take 1 tablet (25 mg total) by mouth daily. 90 tablet 1  . Multiple Vitamin (MULTIVITAMIN WITH MINERALS) TABS tablet Take 1 tablet by mouth daily.    . mupirocin ointment (BACTROBAN) 2 % Apply topically two  times daily 22 g 1  . nystatin ointment (MYCOSTATIN) Apply 1 application topically 3 (three) times daily. (Patient taking differently: Apply 1 application topically as needed. ) 30 g 2  . pioglitazone (ACTOS) 30 MG tablet Take 30 mg by mouth daily.     . traMADol (ULTRAM) 50 MG tablet Take 100 mg by mouth daily.     Marland Kitchen triamcinolone cream (KENALOG) 0.1 % Apply 1 application topically 2 (two) times daily. (Patient taking differently: Apply 1 application topically as needed. ) 45 g 3  . vitamin B-12 (CYANOCOBALAMIN) 1000 MCG tablet Take 1,000 mcg by mouth daily.     No current facility-administered medications for this visit.     Allergies:   Iodinated diagnostic agents; Iohexol; Duloxetine; Dye fdc red [red dye]; Fluticasone; Gabapentin; and Penicillins    Social History:  The patient  reports that he has quit smoking. His smoking use included cigarettes. He has a 96.00 pack-year smoking history. He has never used smokeless tobacco. He reports that he does not drink alcohol or use drugs.   Family History:  The patient's family history includes Brain cancer in his mother; Diabetes in his mother; Prostate cancer in his brother; Throat cancer in his father.    ROS:  Please see the history of present illness.   Otherwise, review of systems are positive for none.   All other systems are reviewed and negative.    PHYSICAL EXAM: VS:  BP 134/72 (BP Location: Right Arm, Patient Position: Sitting, Cuff Size: Normal)   Pulse 61   Ht 5\' 8"  (1.727 m)   Wt 205 lb 8 oz (93.2 kg)   BMI 31.25 kg/m  , BMI Body mass index is 31.25 kg/m. GEN: Well nourished, well developed, in no acute distress  HEENT: normal  Neck: no JVD, carotid bruits, or masses Cardiac: RRR; no murmurs, rubs, or gallops,no edema  Respiratory:  clear to auscultation bilaterally, normal work of breathing GI: soft, nontender, nondistended, + BS MS: no deformity or atrophy  Skin: warm and dry, no rash Neuro:  Strength and sensation  are intact Psych: euthymic mood, full affect   EKG:  EKG is ordered today. The ekg ordered today demonstrates normal sinus rhythm with first-degree AV block and old inferior infarct.   Recent Labs: 01/08/2018: B Natriuretic Peptide 301.0; Hemoglobin 12.0; Magnesium 1.6; Platelets 202 01/23/2018: ALT 30; BUN 15; Creatinine, Ser 1.08; Potassium 4.4; Sodium 139    Lipid Panel    Component Value Date/Time   CHOL 134 12/27/2016 1445   TRIG 89 12/27/2016 1445   HDL 72 12/27/2016 1445   CHOLHDL 1.9 12/27/2016 1445   VLDL 18 12/27/2016  [place] 12/27/2016 1445      Wt Readings from Last 3 Encounters:  02/16/18 205 lb 8 oz (93.2 kg)  01/19/18 209 lb (94.8 kg)  01/16/18 213 lb 12.8 oz (97 kg)      PAD Screen 02/16/2018  Previous PAD dx? Yes  Previous surgical procedure? Yes  Pain with walking? No  Feet/toe relief with dangling? No  Painful, non-healing ulcers? No  Extremities discolored? No      ASSESSMENT AND PLAN:  1.  Coronary artery disease involving native coronary arteries without angina: It appears that the patient had a previous inferior infarct that was treated with angioplasty in 1989 but then had a subsequent cardiac catheterization showed the same vessel to be occluded with collaterals and he has been treated medically since then.  Currently he has no anginal symptoms and I recommend continuing medical therapy.  2.  Mild bradycardia: I suspect is likely due to underlying sleep apnea.  The bradycardia happened mostly at night and has improved since decreasing the dose of metoprolol.  There is no indication for pacemaker at the present time.  3.  Essential hypertension: Blood pressures controlled on current medications.  4.  Hyperlipidemia: Currently on Vytorin.  Most recent LDL was 44.    Disposition:   FU with me in 1 year  Signed,  [name], MD  02/16/2018 2:42 PM    [place]

## 2018-02-22 ENCOUNTER — Encounter: Payer: Self-pay | Admitting: Physical Therapy

## 2018-02-22 ENCOUNTER — Ambulatory Visit: Payer: Medicare Other | Attending: Family Medicine | Admitting: Physical Therapy

## 2018-02-22 DIAGNOSIS — G8929 Other chronic pain: Secondary | ICD-10-CM | POA: Diagnosis not present

## 2018-02-22 DIAGNOSIS — M545 Low back pain, unspecified: Secondary | ICD-10-CM

## 2018-02-22 DIAGNOSIS — R262 Difficulty in walking, not elsewhere classified: Secondary | ICD-10-CM | POA: Diagnosis not present

## 2018-02-22 NOTE — Therapy (Signed)
[facility] [address] [place], [address] Phone: [phone]   Fax:  [phone]  Physical Therapy Evaluation  Patient Details  Name: Allen [name] MRN: [id number] Date of Birth: [birth year] Referring Provider (PT): Meeler   Encounter Date: 02/22/2018  PT End of Session - 02/22/18 1614    Visit Number  1    Number of Visits  17    Date for PT Re-Evaluation  04/19/18    PT Start Time  0230    PT Stop Time  0330    PT Time Calculation (min)  60 min    Activity Tolerance  Patient tolerated treatment well    Behavior During Therapy  [facility] for tasks assessed/performed       Past Medical History:  Diagnosis Date  . Coronary artery disease   . Diabetes mellitus without complication ([place])   . Heart disease   . Hypertension   . MI (myocardial infarction) [facility])     Past Surgical History:  Procedure Laterality Date  . CARDIAC CATHETERIZATION  1989  . CARDIAC CATHETERIZATION  1990  . GALLBLADDER SURGERY    . LEG SURGERY Right    stent placement    There were no vitals filed for this visit.   Subjective Assessment - 02/22/18 1440    Pertinent History  Patient is a 58 yera old male reporting with chronic LBP (5 years). Patient reports he had gall bladder surgery 01/08/18 and PT was suggested to him through that experience Patient reports his LBP is intermittent and comes and goes "randomly", but reports he is more than likely to have pain when he completes STS or is in standing position for prolonged time. Pain is dull/ache across LB that radiates into post, upper LE's with no burning/tingling. 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 Reports worst pain 5/10 and best 0/10 over the past week. Patient has a SPC that he reports he has used "off and on" over the past year to help his balance which he reports "is horrible". Patient reports he feels very unsteady  on his feet but feels "a little better with his cane". Patient reports no falls onto the ground, but does admit to many instances of "catching himself" with his UE to prevent falls. Falls usually d/t small dog, and tripping on rugs and "missing his step". Denies LOC or injury from near falls/LOB. Pt denies N/V, unexplained weight fluctuation, B&B changes, saddle paresthesia, fever, night sweats, or unrelenting night pain at this time. Patient does report 50lb weight loss over the past year with 16 hour fasting, with 2x/week 20hour fasting, per MD. Patient does report some B&B changes since beginning metformin last year, PT encouraged him to discuss this with PCP as well.     Limitations  Walking;Lifting;House hold activities;Standing    How long can you sit comfortably?  unlimited    How long can you stand comfortably?  Standing for any period of time increases pain    How long can you walk comfortably?  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  Recent memory is intact.  Remote memory is intact.  Attention span and concentration are intact.  Expressive speech is intact.  Patient's fund of knowledge is within normal limits for educational level.  SENSATION: Grossly intact to light touch bilateral L as  determined by testing dermatomes L2-S2 Proprioception and hot/cold testing deferred on this date    MUSCULOSKELETAL: Tremor: None Bulk: Normal Tone: Normal  Gait Ambulates w/ decreased hip ext bilat, decreased lumbar lordosis and decreased trunk rotation   Palpation TTP at bilat lumbar paraspinals   Strength (out of 5) R/L 4+4+ Hip flexion 4+/4+Hip ER 5/5 Hip IR 4/4 Hip abduction 4/4 Hip adduction 4-/4- Hip extension 5/5 Knee extension 5/5 Knee flexion 4-/4- Ankle dorsiflexion *Indicates pain   AROM (degrees) All motions wnl, EXCEPT lumbar ext which is 50% limited *Indicates pain  PROM (degrees) PROM = AROM  Repeated Movements No centralization or peripheralization of symptoms with repeated lumbar extension or flexion.    Passive Accessory Intervertebral Motion (PAIVM) Pt denies reproduction of posterior hip pain with CPA L1-L5 and UPA bilaterally L1-L5. Generally hypomobile throughout  Passive Physiological Intervertebral Motion (PPIVM) Normal flexion and extension with PPIVM testing   SPECIAL TESTS Slump: neg bilat SLR: negative  Crossed SLR: negative  FABER: positive bilat FADIR: positive bilat  Hip scour: negative bilat [place]: positive bilat [place]: positive bilat [place]: negative bilat 5xSTS 19.5sec BERG 37/56 10MWT 16sec TUG 12.5sec  Ther-Ex - Hamstring stretch 30sec hold  - Bridge x10 with cuing for proper form with eccentric control and neutral spine with good carry over - Ext x20 (educated on completing 10-20 small ext every hour) - Education on senses that contribute to balance and practice to improve balance. Education on LE and core weakness contributing to LBP and the importance of increasing strength and stability of the muscles surrounding the spine.               Objective measurements completed on examination: See above findings.              PT Education - 02/22/18 1613    Education provided  Yes    Education  Details  Patient was educated on diagnosis, anatomy and pathology involved, prognosis, role of PT, and was given an HEP, demonstrating exercise with proper form following verbal and tactile cues, and was given a paper hand out to continue exercise at home. Pt was educated on and agreed to plan of care    Person(s) Educated  Patient    Methods  Explanation;Demonstration;Tactile cues;Verbal cues    Comprehension  Verbalized understanding;Returned demonstration;Verbal cues required;Tactile cues required       PT Short Term Goals - 02/23/18 1546      PT SHORT TERM GOAL #1   Title  Pt will be independent with HEP in order to improve strength and decrease back pain in order to improve pain-free function at home and work.  Time  4    Period  Weeks    Status  New        PT Long Term Goals - 02/23/18 1547      PT LONG TERM GOAL #1   Title  Pt will decrease worst back pain as reported on NPRS by at least 2 points in order to demonstrate clinically significant reduction in back pain.     Baseline  02/22/18 5/10    Time  8    Period  Weeks    Status  New      PT LONG TERM GOAL #2   Title  Pt will improve BERG by at least 3 points in order to demonstrate clinically significant improvement in balance    Baseline  02/22/18 37/56    Time  8    Period  Weeks    Status  New      PT LONG TERM GOAL #3   Title  Pt will increase 10MWT by at least 0.13 m/s in order to demonstrate clinically significant improvement in community ambulation.     Baseline  02/22/18 16sec    Time  8    Period  Weeks    Status  New      PT LONG TERM GOAL #4   Title  Pt will decrease 5TSTS by at least 3 seconds in order to demonstrate clinically significant improvement in LE strength    Baseline  02/22/18 19.5sec    Time  8    Period  Weeks    Status  New      PT LONG TERM GOAL #5   Title  Patient will demonstrate [place] or less for TUG test to demonstrate decreased fall risk    Baseline  02/22/18 12.5sec    Time  8     Period  Weeks    Status  New             Plan - 02/23/18 1531    Clinical Impression Statement  Patient is a 78year old man, presenting to clinic with chronic LB, worsening over the past couple months. Patient with impairments in LE and core strength, balance and low back pain. Patient is currently unable to perform STS transfers, bend, squat, or stoop without pain; inhibiting his ability to fully participate in ADLs. Would benefit from skilled PT to address above deficits and promote optimal return to PLOF    Clinical Presentation  Evolving    Clinical Presentation due to:  Moderate (evolving): 1-2 personal factors/comorbidities, 3 or more body systems/activity limitations/participation restrictions     Clinical Decision Making  Moderate    Rehab Potential  Fair    Clinical Impairments Affecting Rehab Potential  Negative: Chronic condition, co-morbidities. Positive: motivated to improve, good results from previous PT.     PT Frequency  2x / week    PT Duration  8 weeks    PT Treatment/Interventions  ADLs/Self Care Home Management;Cryotherapy;Electrical Stimulation;Moist Heat;Traction;Ultrasound;DME Instruction;Gait training;Stair training;Functional mobility training;Therapeutic activities;Therapeutic exercise;Balance training;Neuromuscular re-education;Patient/family education;Manual techniques;Passive range of motion;Dry needling;Energy conservation    PT Next Visit Plan  HEP review    PT Home Exercise Plan  bridge, hamstring stretch, lumbar ext    Consulted and Agree with Plan of Care  Patient       Patient will benefit from skilled therapeutic intervention in order to improve the following deficits and impairments:  Abnormal gait, Decreased activity tolerance, Decreased balance, Decreased coordination, Decreased endurance, Decreased mobility, Decreased knowledge of use of  DME, Decreased range of motion, Decreased strength, Difficulty walking, Hypomobility, Increased fascial  restricitons, Increased muscle spasms, Impaired perceived functional ability, Impaired flexibility, Impaired sensation, Improper body mechanics, Postural dysfunction, Obesity, Pain  Visit Diagnosis: Chronic midline low back pain without sciatica  Difficulty in walking, not elsewhere classified     Problem List Patient Active Problem List   Diagnosis Date Noted  . Encounter for general adult medical examination with abnormal findings 01/18/2018  . Abnormal liver function 01/18/2018  . S/P laparoscopic cholecystectomy 01/18/2018  . Bradycardia 01/18/2018  . Cutaneous candidiasis 01/18/2018  . Oral candidiasis 01/18/2018  . Septic shock ([place]) 01/08/2018  . Insect bite of right lower leg 09/25/2017  . Atopic dermatitis 09/25/2017  . PAD (peripheral artery disease) ([place]) 11/24/2015  . Type 2 diabetes mellitus with complication, without long-term current use of insulin ([place]) 11/24/2015  . Hyperlipidemia 11/24/2015  . Essential hypertension 11/24/2015  . PVD (peripheral vascular disease) ([place]) 10/29/2015   Shelton Silvas PT, DPT Shelton Silvas 02/23/2018, 3:51 PM  [facility] [address] [place], [address] Phone: [phone]   Fax:  [phone]  Name: Allen [name] MRN: [id number] Date of Birth: September 19, 1940

## 2018-02-27 ENCOUNTER — Ambulatory Visit: Payer: Medicare Other | Admitting: Physical Therapy

## 2018-03-02 ENCOUNTER — Encounter: Payer: Self-pay | Admitting: Physical Therapy

## 2018-03-02 ENCOUNTER — Ambulatory Visit: Payer: Medicare Other | Admitting: Physical Therapy

## 2018-03-02 DIAGNOSIS — M545 Low back pain, unspecified: Secondary | ICD-10-CM

## 2018-03-02 DIAGNOSIS — G8929 Other chronic pain: Secondary | ICD-10-CM

## 2018-03-02 DIAGNOSIS — R262 Difficulty in walking, not elsewhere classified: Secondary | ICD-10-CM

## 2018-03-02 NOTE — Therapy (Signed)
[facility] [address] [place], [address] Phone: [phone]   Fax:  [phone]  Physical Therapy Treatment  Patient Details  Name: Allen [name] MRN: [id number] Date of Birth: 02-Mar-1940 Referring Provider (PT): Meeler   Encounter Date: 03/02/2018  PT End of Session - 03/02/18 1328    Visit Number  2    Number of Visits  17    Date for PT Re-Evaluation  04/19/18    Authorization Type  medicare    Authorization Time Period  2/10    PT Start Time  0100    PT Stop Time  0145    PT Time Calculation (min)  45 min    Equipment Utilized During Treatment  Gait belt    Activity Tolerance  Patient tolerated treatment well    Behavior During Therapy  [facility] for tasks assessed/performed       Past Medical History:  Diagnosis Date  . Coronary artery disease   . Diabetes mellitus without complication ([name])   . Heart disease   . Hypertension   . MI (myocardial infarction) [facility])     Past Surgical History:  Procedure Laterality Date  . CARDIAC CATHETERIZATION  1989  . CARDIAC CATHETERIZATION  1990  . GALLBLADDER SURGERY    . LEG SURGERY Right    stent placement    There were no vitals filed for this visit.  Subjective Assessment - 03/02/18 1305    Subjective  Patient reports his back pain is 3/10 today. Patient reports he has not been completing his "leaning back" as often as "he should" but his back feels better when he does.     Pertinent History  Patient is a 84 yera old male reporting with chronic LBP (5 years). Patient reports he had gall bladder surgery 01/08/18 and PT was suggested to him through that experience Patient reports his LBP is intermittent and comes and goes "randomly", but reports he is more than likely to have pain when he completes STS or is in standing position for prolonged time. Pain is dull/ache across LB that radiates into post, upper LE's with no burning/tingling. Admits neuropathy from  mid tibia down with no sensation in bilat feet/ankles over past 30-40 years. Reports worst pain 5/10 and best 0/10 over the past week. Patient has a SPC that he reports he has used "off and on" over the past year to help his balance which he reports "is horrible". Patient reports he feels very unsteady on his feet but feels "a little better with his cane". Patient reports no falls onto the ground, but does admit to many instances of "catching himself" with his UE to prevent falls. Falls usually d/t small dog, and tripping on rugs and "missing his step". Denies LOC or injury from near falls/LOB. Pt denies N/V, unexplained weight fluctuation, B&B changes, saddle paresthesia, fever, night sweats, or unrelenting night pain at this time. Patient does report 50lb weight loss over the past year with 16 hour fasting, with 2x/week 20hour fasting, per MD. Patient does report some B&B changes since beginning metformin last year, PT encouraged him to discuss this with PCP as well.     Limitations  Walking;Lifting;House hold activities;Standing    How long can you sit comfortably?  unlimited    How long can you stand comfortably?  Standing for any period of time increases pain    How long can you walk comfortably?  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 Attempted to decrease muscle tension at this area. With PT assessing patient tolerance throughout (increasing intensity as needed), monitoring skin integrity (normal), with decreased pain noted from patient    Ther-Ex - Bridge exercise 3x 10 with "hamstring cramp" initially that subsides following glute set prior; min cuing to prevent LB ext - Mini squat from elevated mat table 2x 10 with demo prior and max cuing for proper form initially with good carry over following                     PT Education - 03/02/18 1312    Education provided  Yes    Education Details  Exercise form, ESTIM education    Person(s) Educated  Patient    Methods  Explanation;Demonstration;Tactile cues;Verbal cues    Comprehension  Verbalized understanding;Returned demonstration;Verbal cues required;Tactile cues required       PT Short Term Goals - 02/23/18 1546      PT SHORT TERM GOAL #1   Title  Pt will be independent with HEP in order to improve strength and decrease back pain in order to improve pain-free function at home and work.     Time  4    Period  Weeks    Status  New        PT Long Term Goals - 02/23/18 1547      PT LONG TERM GOAL #1   Title  Pt will decrease worst back pain as reported on NPRS by at least 2 points in order to demonstrate clinically significant reduction in back pain.     Baseline  02/22/18 5/10    Time  8    Period  Weeks    Status  New      PT LONG TERM GOAL #2   Title  Pt will improve BERG by at least 3 points in order to demonstrate clinically significant improvement in balance    Baseline  02/22/18 37/56    Time  8    Period  Weeks    Status  New      PT LONG TERM GOAL #3   Title  Pt will increase 10MWT by at least 0.13 m/s in order to demonstrate clinically significant improvement in community ambulation.     Baseline  02/22/18 16sec    Time  8    Period  Weeks    Status  New      PT LONG TERM GOAL #4   Title  Pt will  decrease 5TSTS by at least 3 seconds in order to demonstrate clinically significant improvement in LE strength    Baseline  02/22/18 19.5sec  Time  8    Period  Weeks    Status  New      PT LONG TERM GOAL #5   Title  Patient will demonstrate 10sec or less for TUG test to demonstrate decreased fall risk    Baseline  02/22/18 12.5sec    Time  8    Period  Weeks    Status  New            Plan - 03/02/18 1340    Clinical Impression Statement  PT utilized manual and modality techniques to decrease pain, which patient reports no pain following. PT ended session with therex for glute activation and encouraged porper spine posture for carry over into functional movements. Patient is able to complete therex with accuracy following PT cuing.     Rehab Potential  Fair    Clinical Impairments Affecting Rehab Potential  Negative: Chronic condition, co-morbidities. Positive: motivated to improve, good results from previous PT.     PT Frequency  2x / week    PT Duration  8 weeks    PT Treatment/Interventions  ADLs/Self Care Home Management;Cryotherapy;Electrical Stimulation;Moist Heat;Traction;Ultrasound;DME Instruction;Gait training;Stair training;Functional mobility training;Therapeutic activities;Therapeutic exercise;Balance training;Neuromuscular re-education;Patient/family education;Manual techniques;Passive range of motion;Dry needling;Energy conservation    PT Next Visit Plan  HEP review    PT Home Exercise Plan  bridge, hamstring stretch, lumbar ext    Consulted and Agree with Plan of Care  Patient       Patient will benefit from skilled therapeutic intervention in order to improve the following deficits and impairments:  Abnormal gait, Decreased activity tolerance, Decreased balance, Decreased coordination, Decreased endurance, Decreased mobility, Decreased knowledge of use of DME, Decreased range of motion, Decreased strength, Difficulty walking, Hypomobility, Increased fascial  restricitons, Increased muscle spasms, Impaired perceived functional ability, Impaired flexibility, Impaired sensation, Improper body mechanics, Postural dysfunction, Obesity, Pain  Visit Diagnosis: Chronic midline low back pain without sciatica  Difficulty in walking, not elsewhere classified     Problem List Patient Active Problem List   Diagnosis Date Noted  . Encounter for general adult medical examination with abnormal findings 01/18/2018  . Abnormal liver function 01/18/2018  . S/P laparoscopic cholecystectomy 01/18/2018  . Bradycardia 01/18/2018  . Cutaneous candidiasis 01/18/2018  . Oral candidiasis 01/18/2018  . Septic shock ([place]) 01/08/2018  . Insect bite of right lower leg 09/25/2017  . Atopic dermatitis 09/25/2017  . PAD (peripheral artery disease) ([place]) 11/24/2015  . Type 2 diabetes mellitus with complication, without long-term current use of insulin ([place]) 11/24/2015  . Hyperlipidemia 11/24/2015  . Essential hypertension 11/24/2015  . PVD (peripheral vascular disease) ([place]) 10/29/2015   Shelton Silvas PT, DPT Shelton Silvas 03/02/2018, 1:49 PM  [facility] [address], [place], [address] Phone: [phone]   Fax:  [phone]  Name: Alante Tolan MRN: [id number] Date of Birth: 03-04-1940

## 2018-03-03 ENCOUNTER — Ambulatory Visit: Payer: Medicare Other | Admitting: Physical Therapy

## 2018-03-06 ENCOUNTER — Ambulatory Visit: Payer: Medicare Other | Admitting: Physical Therapy

## 2018-03-06 ENCOUNTER — Encounter: Payer: Self-pay | Admitting: Physical Therapy

## 2018-03-06 DIAGNOSIS — G8929 Other chronic pain: Secondary | ICD-10-CM | POA: Diagnosis not present

## 2018-03-06 DIAGNOSIS — R262 Difficulty in walking, not elsewhere classified: Secondary | ICD-10-CM | POA: Diagnosis not present

## 2018-03-06 DIAGNOSIS — M545 Low back pain, unspecified: Secondary | ICD-10-CM

## 2018-03-06 NOTE — Therapy (Addendum)
[place] [facility] [facility] [address], [place], [address] Phone: [phone]   Fax:  [phone]  Physical Therapy Treatment  Patient Details  Name: Allen [name] MRN: [id number] Date of Birth: 1940/08/06 Referring Provider (PT): Meeler   Encounter Date: 03/06/2018    Past Medical History:  Diagnosis Date  . Coronary artery disease   . Diabetes mellitus without complication ([place])   . Heart disease   . Hypertension   . MI (myocardial infarction) [facility])     Past Surgical History:  Procedure Laterality Date  . CARDIAC CATHETERIZATION  1989  . CARDIAC CATHETERIZATION  1990  . GALLBLADDER SURGERY    . LEG SURGERY Right    stent placement    There were no vitals filed for this visit.    Manual - STM with trigger point release to bilat QL and lumbar paraspinals with increased time spent on L QL/lumbar paraspinals Following: Dry Needling: (4) 34mm .25 needles placed along the R QL and lumbar paraspinals to decrease increased muscular spasms and trigger points with the patient positioned in prone. Patient was educated on risks and benefits of therapy and verbally consents to PT.  - L2-S1 grade III-IV CPA 30sec bouts 8 bouts each segment; one round in supine, one in prone prop - Lumbar traction 10x 10sec traction; 10sec relax  ESTIM+ heat packHiVolt ESTIM10 min at patient tolerated130Vincreased to140V through treatmentat bilat lumbar QL/lumbar paraspinals area. Attempted to decrease muscle tension at this area. With PT assessing patient tolerance throughout (increasing intensity as needed), monitoring skin integrity (normal), with decreased pain noted from patient                             PT Short Term Goals - 02/23/18 1546      PT SHORT TERM GOAL #1   Title  Pt will be independent with HEP in order to improve strength and decrease back pain in order to improve pain-free function at  home and work.     Time  4    Period  Weeks    Status  New        PT Long Term Goals - 02/23/18 1547      PT LONG TERM GOAL #1   Title  Pt will decrease worst back pain as reported on NPRS by at least 2 points in order to demonstrate clinically significant reduction in back pain.     Baseline  02/22/18 5/10    Time  8    Period  Weeks    Status  New      PT LONG TERM GOAL #2   Title  Pt will improve BERG by at least 3 points in order to demonstrate clinically significant improvement in balance    Baseline  02/22/18 37/56    Time  8    Period  Weeks    Status  New      PT LONG TERM GOAL #3   Title  Pt will increase 10MWT by at least 0.13 m/s in order to demonstrate clinically significant improvement in community ambulation.     Baseline  02/22/18 16sec    Time  8    Period  Weeks    Status  New      PT LONG TERM GOAL #4   Title  Pt will decrease 5TSTS by at least 3 seconds in order to demonstrate clinically significant improvement in LE strength  Baseline  02/22/18 19.5sec    Time  8    Period  Weeks    Status  New      PT LONG TERM GOAL #5   Title  Patient will demonstrate 10sec or less for TUG test to demonstrate decreased fall risk    Baseline  02/22/18 12.5sec    Time  8    Period  Weeks    Status  New              Patient will benefit from skilled therapeutic intervention in order to improve the following deficits and impairments:  Abnormal gait, Decreased activity tolerance, Decreased balance, Decreased coordination, Decreased endurance, Decreased mobility, Decreased knowledge of use of DME, Decreased range of motion, Decreased strength, Difficulty walking, Hypomobility, Increased fascial restricitons, Increased muscle spasms, Impaired perceived functional ability, Impaired flexibility, Impaired sensation, Improper body mechanics, Postural dysfunction, Obesity, Pain  Visit Diagnosis: Chronic midline low back pain without sciatica     Problem List Patient  Active Problem List   Diagnosis Date Noted  . Encounter for general adult medical examination with abnormal findings 01/18/2018  . Abnormal liver function 01/18/2018  . S/P laparoscopic cholecystectomy 01/18/2018  . Bradycardia 01/18/2018  . Cutaneous candidiasis 01/18/2018  . Oral candidiasis 01/18/2018  . Septic shock ([place]) 01/08/2018  . Insect bite of right lower leg 09/25/2017  . Atopic dermatitis 09/25/2017  . PAD (peripheral artery disease) ([place]) 11/24/2015  . Type 2 diabetes mellitus with complication, without long-term current use of insulin ([place]) 11/24/2015  . Hyperlipidemia 11/24/2015  . Essential hypertension 11/24/2015  . PVD (peripheral vascular disease) ([place]) 10/29/2015   Shelton Silvas PT, DPT Shelton Silvas 03/14/2018, 5:08 PM  [place] [facility] [address] [place], [address] Phone: [phone]   Fax:  [phone]  Name: Allen [name] MRN: [id number] Date of Birth: [birth year]

## 2018-03-08 ENCOUNTER — Ambulatory Visit (INDEPENDENT_AMBULATORY_CARE_PROVIDER_SITE_OTHER): Payer: Medicare Other

## 2018-03-08 DIAGNOSIS — G4733 Obstructive sleep apnea (adult) (pediatric): Secondary | ICD-10-CM | POA: Diagnosis not present

## 2018-03-08 NOTE — Progress Notes (Signed)
95 percentile pressure 11   95th percentile leak 41.1   apnea index 0.8 /hr  apnea-hypopnea index  1.3 /hr   total days used  >4 hr 89 days  total days used <4 hr 1 days  Total compliance 99 percent  Mr. Cerasoli is doing great with cpap. No problems or questions at this time.

## 2018-03-09 ENCOUNTER — Ambulatory Visit: Payer: Medicare Other

## 2018-03-09 ENCOUNTER — Encounter: Payer: Self-pay | Admitting: Physical Therapy

## 2018-03-09 DIAGNOSIS — M545 Low back pain, unspecified: Secondary | ICD-10-CM

## 2018-03-09 DIAGNOSIS — R262 Difficulty in walking, not elsewhere classified: Secondary | ICD-10-CM | POA: Diagnosis not present

## 2018-03-09 DIAGNOSIS — G8929 Other chronic pain: Secondary | ICD-10-CM

## 2018-03-09 NOTE — Therapy (Signed)
[place] [facility] [address] [place], [address] Phone: [phone]   Fax:  [phone]  Physical Therapy Treatment  Patient Details  Name: Allen [name] MRN: [id number] Date of Birth: 31-May-1940 Referring Provider (PT): Meeler   Encounter Date: 03/09/2018  PT End of Session - 03/09/18 1350    Visit Number  4    Number of Visits  17    Date for PT Re-Evaluation  04/19/18    Authorization Type  medicare    Authorization Time Period  4/10    PT Start Time  1302    PT Stop Time  1344    PT Time Calculation (min)  42 min    Equipment Utilized During Treatment  Gait belt    Activity Tolerance  Patient tolerated treatment well    Behavior During Therapy  [facility] for tasks assessed/performed       Past Medical History:  Diagnosis Date  . Coronary artery disease   . Diabetes mellitus without complication ([place])   . Heart disease   . Hypertension   . MI (myocardial infarction) [facility])     Past Surgical History:  Procedure Laterality Date  . CARDIAC CATHETERIZATION  1989  . CARDIAC CATHETERIZATION  1990  . GALLBLADDER SURGERY    . LEG SURGERY Right    stent placement    There were no vitals filed for this visit.    Manual: Performed with supervision and deep techniques including trigger point release goal; decrease muscle tension, improve tissue extensibility, improve mobility - STM with trigger point release to bilat QL and lumbar paraspinals with increased time spent on L QL/lumbar paraspinals - L2-S1 grade III-IV CPA 30sec bouts 4 bouts each segment; one round in supine, one in prone prop      Ther-Ex: Exercises performed with moderate verbal/tactile cues this session, with good carry over with repetition. Goal: address ROM, tissue extensibility, mobility Lower trunk rotation 10x with 3 sec hold Hamstring stretch 3x30sec with PT assist Piriformis stretch 3 x30sec Set up for lumbar mobilization,  stretch     PT Education - 03/09/18 1348    Education provided  Yes    Education Details  log rolling technique, stretches, importance of HEP    Person(s) Educated  Patient    Methods  Explanation;Demonstration;Verbal cues;Tactile cues    Comprehension  Verbalized understanding;Need further instruction       PT Short Term Goals - 02/23/18 1546      PT SHORT TERM GOAL #1   Title  Pt will be independent with HEP in order to improve strength and decrease back pain in order to improve pain-free function at home and work.     Time  4    Period  Weeks    Status  New        PT Long Term Goals - 02/23/18 1547      PT LONG TERM GOAL #1   Title  Pt will decrease worst back pain as reported on NPRS by at least 2 points in order to demonstrate clinically significant reduction in back pain.     Baseline  02/22/18 5/10    Time  8    Period  Weeks    Status  New      PT LONG TERM GOAL #2   Title  Pt will improve BERG by at least 3 points in order to demonstrate clinically significant improvement in balance    Baseline  02/22/18 37/56  Time  8    Period  Weeks    Status  New      PT LONG TERM GOAL #3   Title  Pt will increase 10MWT by at least 0.13 m/s in order to demonstrate clinically significant improvement in community ambulation.     Baseline  02/22/18 16sec    Time  8    Period  Weeks    Status  New      PT LONG TERM GOAL #4   Title  Pt will decrease 5TSTS by at least 3 seconds in order to demonstrate clinically significant improvement in LE strength    Baseline  02/22/18 19.5sec    Time  8    Period  Weeks    Status  New      PT LONG TERM GOAL #5   Title  Patient will demonstrate 10sec or less for TUG test to demonstrate decreased fall risk    Baseline  02/22/18 12.5sec    Time  8    Period  Weeks    Status  New            Plan - 03/09/18 1348    Clinical Impression Statement  Patient demonstrated good tolerance to activity today. After stretching, pt reported  back pain decreased from 5/10 to 3/10. Mild muscle tension noted with palpation with pt reporting some tenderness to lumbar paraspinals and bilateral QLs. The patient was instructed in log rolling technique as well for home use. The patient would benefit from further skilled Pt to continue to progress towards goals.     Rehab Potential  Fair    Clinical Impairments Affecting Rehab Potential  Negative: Chronic condition, co-morbidities. Positive: motivated to improve, good results from previous PT.     PT Frequency  2x / week    PT Duration  8 weeks    PT Treatment/Interventions  ADLs/Self Care Home Management;Cryotherapy;Electrical Stimulation;Moist Heat;Traction;Ultrasound;DME Instruction;Gait training;Stair training;Functional mobility training;Therapeutic activities;Therapeutic exercise;Balance training;Neuromuscular re-education;Patient/family education;Manual techniques;Passive range of motion;Dry needling;Energy conservation    PT Next Visit Plan  core and hip strengthening    PT Home Exercise Plan  bridge, hamstring stretch, lumbar ext    Consulted and Agree with Plan of Care  Patient       Patient will benefit from skilled therapeutic intervention in order to improve the following deficits and impairments:  Abnormal gait, Decreased activity tolerance, Decreased balance, Decreased coordination, Decreased endurance, Decreased mobility, Decreased knowledge of use of DME, Decreased range of motion, Decreased strength, Difficulty walking, Hypomobility, Increased fascial restricitons, Increased muscle spasms, Impaired perceived functional ability, Impaired flexibility, Impaired sensation, Improper body mechanics, Postural dysfunction, Obesity, Pain  Visit Diagnosis: Chronic midline low back pain without sciatica  Difficulty in walking, not elsewhere classified     Problem List Patient Active Problem List   Diagnosis Date Noted  . Encounter for general adult medical examination with  abnormal findings 01/18/2018  . Abnormal liver function 01/18/2018  . S/P laparoscopic cholecystectomy 01/18/2018  . Bradycardia 01/18/2018  . Cutaneous candidiasis 01/18/2018  . Oral candidiasis 01/18/2018  . Septic shock ([place]) 01/08/2018  . Insect bite of right lower leg 09/25/2017  . Atopic dermatitis 09/25/2017  . PAD (peripheral artery disease) ([place]) 11/24/2015  . Type 2 diabetes mellitus with complication, without long-term current use of insulin ([place]) 11/24/2015  . Hyperlipidemia 11/24/2015  . Essential hypertension 11/24/2015  . PVD (peripheral vascular disease) ([place]) 10/29/2015    Lieutenant Diego PT, DPT 2:37 PM,03/09/18 [phone]  [place]  [place] [facility] [address] [place], [address] Phone: [phone]   Fax:  [phone]  Name: Dauntae Derusha MRN: [id number] Date of Birth: [birth year]

## 2018-03-14 ENCOUNTER — Encounter: Payer: Self-pay | Admitting: Physical Therapy

## 2018-03-14 ENCOUNTER — Ambulatory Visit: Payer: Medicare Other | Admitting: Physical Therapy

## 2018-03-14 DIAGNOSIS — G8929 Other chronic pain: Secondary | ICD-10-CM | POA: Diagnosis not present

## 2018-03-14 DIAGNOSIS — M545 Low back pain: Secondary | ICD-10-CM | POA: Diagnosis not present

## 2018-03-14 DIAGNOSIS — R262 Difficulty in walking, not elsewhere classified: Secondary | ICD-10-CM | POA: Diagnosis not present

## 2018-03-14 NOTE — Therapy (Signed)
[place] [facility] [address], [place], [address] Phone: [phone]   Fax:  [phone]  Physical Therapy Treatment  Patient Details  Name: Allen [name] MRN: [id number] Date of Birth: 1940/10/04 Referring Provider (PT): Meeler   Encounter Date: 03/14/2018  PT End of Session - 03/14/18 1716    Visit Number  5    Number of Visits  17    Date for PT Re-Evaluation  04/19/18    Authorization Type  medicare    Authorization Time Period  5/10    PT Start Time  0500    PT Stop Time  0545    PT Time Calculation (min)  45 min    Activity Tolerance  Patient tolerated treatment well    Behavior During Therapy  [facility] for tasks assessed/performed       Past Medical History:  Diagnosis Date  . Coronary artery disease   . Diabetes mellitus without complication ([place])   . Heart disease   . Hypertension   . MI (myocardial infarction) [facility])     Past Surgical History:  Procedure Laterality Date  . CARDIAC CATHETERIZATION  1989  . CARDIAC CATHETERIZATION  1990  . GALLBLADDER SURGERY    . LEG SURGERY Right    stent placement    There were no vitals filed for this visit.  Subjective Assessment - 03/14/18 1704    Subjective  Patient reports he was sore following TDN, but then felt better. Patient reports only 1/10 pain this session. Patient reports compliance with his HEP.     Pertinent History  Patient is a 76 yera old male reporting with chronic LBP (5 years). Patient reports he had gall bladder surgery 01/08/18 and PT was suggested to him through that experience Patient reports his LBP is intermittent and comes and goes "randomly", but reports he is more than likely to have pain when he completes STS or is in standing position for prolonged time. Pain is dull/ache across LB that radiates into post, upper LE's with no burning/tingling. 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 Reports worst pain 5/10 and best 0/10 over the past week. Patient has a SPC that he reports he has used "off and on" over the past year to help his balance which he reports "is horrible". Patient reports he feels very unsteady on his feet but feels "a little better with his cane". Patient reports no falls onto the ground, but does admit to many instances of "catching himself" with his UE to prevent falls. Falls usually d/t small dog, and tripping on rugs and "missing his step". Denies LOC or injury from near falls/LOB. Pt denies N/V, unexplained weight fluctuation, B&B changes, saddle paresthesia, fever, night sweats, or unrelenting night pain at this time. Patient does report 50lb weight loss over the past year with 16 hour fasting, with 2x/week 20hour fasting, per MD. Patient does report some B&B changes since beginning metformin last year, PT encouraged him to discuss this with PCP as well.     Limitations  Walking;Lifting;House hold activities;Standing    How long can you sit comfortably?  unlimited    How long can you stand comfortably?  Standing for any period of time increases pain    How long can you walk comfortably?  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 Carried over into bridge exercise 3x 10 with cuing for  maintained core activation to prevent LB arch and glute contraction - Attempted TA marching in supine from mat with max cuing with patient unable to complete with maintained core contraction; modified to marching from feet elevated 6in which patient is able to complete with accuracy  - Palloff anti rotation 5# 3x 10 with CGA for safety. Patient reports some dizziness, cued for gaze stabilization which corrects this                    PT Education - 03/14/18 1716    Education provided  Yes    Education Details  Exercise form    Person(s) Educated  Patient    Methods  Explanation;Demonstration;Tactile cues;Verbal cues    Comprehension  Returned demonstration;Tactile cues required;Verbalized understanding;Verbal cues required       PT Short Term Goals - 02/23/18 1546      PT SHORT TERM GOAL #1   Title  Pt will be independent with HEP in order to improve strength and decrease back pain in order to improve pain-free function at home and work.     Time  4    Period  Weeks    Status  New        PT Long Term Goals - 02/23/18 1547      PT LONG TERM GOAL #1   Title  Pt will decrease worst back pain as reported on NPRS by at least 2 points in order to demonstrate clinically significant reduction in back pain.     Baseline  02/22/18 5/10    Time  8    Period  Weeks    Status  New      PT LONG TERM GOAL #2   Title  Pt will improve BERG by at least 3 points in order to demonstrate clinically significant improvement in balance    Baseline  02/22/18 37/56    Time  8    Period  Weeks    Status  New      PT LONG TERM GOAL #3   Title  Pt will increase 10MWT by at least 0.13 m/s in order to demonstrate clinically significant improvement in community ambulation.     Baseline  02/22/18 16sec    Time  8    Period  Weeks    Status  New      PT LONG TERM GOAL #4   Title  Pt will decrease 5TSTS by at least 3 seconds in order to demonstrate clinically significant improvement in LE  strength    Baseline  02/22/18 19.5sec    Time  8    Period  Weeks    Status  New  PT LONG TERM GOAL #5   Title  Patient will demonstrate 10sec or less for TUG test to demonstrate decreased fall risk    Baseline  02/22/18 12.5sec    Time  8    Period  Weeks    Status  New            Plan - 03/14/18 1729    Clinical Impression Statement  PT was able to progress therex this session for core strengthening as patient is demonstrating decreased pain. Patient is able to complete therex with accuracy following PT cuing. As patient's pain is decreasing and allowing for further therex, balance deficits are more evident. Patient reports he thinks his balance and back are very related. Patient reports that he feels more balanced when he "touches" things around him when walking. PT educated patient on concept of proprioception and working on this through sessions, as decreased pain allows. Patient verbalized understanding of all provided education.     Rehab Potential  Fair    Clinical Impairments Affecting Rehab Potential  Negative: Chronic condition, co-morbidities. Positive: motivated to improve, good results from previous PT.     PT Frequency  2x / week    PT Duration  8 weeks    PT Treatment/Interventions  ADLs/Self Care Home Management;Cryotherapy;Electrical Stimulation;Moist Heat;Traction;Ultrasound;DME Instruction;Gait training;Stair training;Functional mobility training;Therapeutic activities;Therapeutic exercise;Balance training;Neuromuscular re-education;Patient/family education;Manual techniques;Passive range of motion;Dry needling;Energy conservation    PT Next Visit Plan  core and hip strengthening    PT Home Exercise Plan  bridge, hamstring stretch, lumbar ext    Consulted and Agree with Plan of Care  Patient       Patient will benefit from skilled therapeutic intervention in order to improve the following deficits and impairments:  Abnormal gait, Decreased activity tolerance,  Decreased balance, Decreased coordination, Decreased endurance, Decreased mobility, Decreased knowledge of use of DME, Decreased range of motion, Decreased strength, Difficulty walking, Hypomobility, Increased fascial restricitons, Increased muscle spasms, Impaired perceived functional ability, Impaired flexibility, Impaired sensation, Improper body mechanics, Postural dysfunction, Obesity, Pain  Visit Diagnosis: Chronic midline low back pain without sciatica     Problem List Patient Active Problem List   Diagnosis Date Noted  . Encounter for general adult medical examination with abnormal findings 01/18/2018  . Abnormal liver function 01/18/2018  . S/P laparoscopic cholecystectomy 01/18/2018  . Bradycardia 01/18/2018  . Cutaneous candidiasis 01/18/2018  . Oral candidiasis 01/18/2018  . Septic shock ([place]) 01/08/2018  . Insect bite of right lower leg 09/25/2017  . Atopic dermatitis 09/25/2017  . PAD (peripheral artery disease) ([place]) 11/24/2015  . Type 2 diabetes mellitus with complication, without long-term current use of insulin ([place]) 11/24/2015  . Hyperlipidemia 11/24/2015  . Essential hypertension 11/24/2015  . PVD (peripheral vascular disease) ([place]) 10/29/2015   Shelton Silvas PT, DPT Shelton Silvas 03/14/2018, 5:37 PM  [place] [address] [place], [address] Phone: [phone]   Fax:  [phone]  Name: Allen [name] MRN: [id number] Date of Birth: 1941-02-07

## 2018-03-21 ENCOUNTER — Encounter: Payer: Self-pay | Admitting: Physical Therapy

## 2018-03-21 ENCOUNTER — Ambulatory Visit: Payer: Medicare Other | Attending: Family Medicine | Admitting: Physical Therapy

## 2018-03-21 DIAGNOSIS — G8929 Other chronic pain: Secondary | ICD-10-CM | POA: Diagnosis not present

## 2018-03-21 DIAGNOSIS — M545 Low back pain: Secondary | ICD-10-CM | POA: Insufficient documentation

## 2018-03-21 NOTE — Therapy (Signed)
[place] [facility] [address], [place], [address] Phone: [phone]   Fax:  [phone]  Physical Therapy Treatment  Patient Details  Name: Allen [name] MRN: [id number] Date of Birth: 1940/08/16 Referring Provider (PT): Meeler   Encounter Date: 03/21/2018  PT End of Session - 03/21/18 1358    Visit Number  6    Number of Visits  17    Date for PT Re-Evaluation  04/19/18    Authorization Type  medicare    Authorization Time Period  6/10    PT Start Time  0152    PT Stop Time  0230    PT Time Calculation (min)  38 min    Activity Tolerance  Patient tolerated treatment well    Behavior During Therapy  [facility] for tasks assessed/performed       Past Medical History:  Diagnosis Date  . Coronary artery disease   . Diabetes mellitus without complication ([place])   . Heart disease   . Hypertension   . MI (myocardial infarction) [facility])     Past Surgical History:  Procedure Laterality Date  . CARDIAC CATHETERIZATION  1989  . CARDIAC CATHETERIZATION  1990  . GALLBLADDER SURGERY    . LEG SURGERY Right    stent placement    There were no vitals filed for this visit.  Subjective Assessment - 03/21/18 1356    Subjective  Patient reports minimal pain over the weekend. Patient reports 2/10 pain this am. Reports compliance with his HEP with no questions or concerns.     Pertinent History  Patient is a 61 yera old male reporting with chronic LBP (5 years). Patient reports he had gall bladder surgery 01/08/18 and PT was suggested to him through that experience Patient reports his LBP is intermittent and comes and goes "randomly", but reports he is more than likely to have pain when he completes STS or is in standing position for prolonged time. Pain is dull/ache across LB that radiates into post, upper LE's with no burning/tingling. 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  Reports worst pain 5/10 and best 0/10 over the past week. Patient has a SPC that he reports he has used "off and on" over the past year to help his balance which he reports "is horrible". Patient reports he feels very unsteady on his feet but feels "a little better with his cane". Patient reports no falls onto the ground, but does admit to many instances of "catching himself" with his UE to prevent falls. Falls usually d/t small dog, and tripping on rugs and "missing his step". Denies LOC or injury from near falls/LOB. Pt denies N/V, unexplained weight fluctuation, B&B changes, saddle paresthesia, fever, night sweats, or unrelenting night pain at this time. Patient does report 50lb weight loss over the past year with 16 hour fasting, with 2x/week 20hour fasting, per MD. Patient does report some B&B changes since beginning metformin last year, PT encouraged him to discuss this with PCP as well.     Limitations  Walking;Lifting;House hold activities;Standing    How long can you sit comfortably?  unlimited    How long can you stand comfortably?  Standing for any period of time increases pain    How long can you walk comfortably?  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    Patient Stated Goals  Improve balance to feel safer at home, be able to walk without pain    Pain Onset  More than a month ago    Pain Onset  More than a month ago       Ther-Ex - Nustep L1 14mins  - MATRIX hip abd 3x 10 each side with cuing initially for proper posture and eccentric control with good carry over following - Posterior pelvic tilt x5 3sec old with min cuing; good carry over - Bridge exercise with maintained core contraction (carry over from post pelvic tilt) 2x 10 - Hooklying lower trunk rotation with PT manual resistance at both knees for rotation each way 3x 6 each direction  with cuing for maintained core contraction - Mini squat with elevated mat table for TC for ROM 3x 8 with demo and cuing for proper form initially for full hip ext - Education on proper STS form without back compensation, with education on the importance of LE strength to take burden off of LB with transfers/lifting tasks; patient verbalizes understanding                           PT Education - 03/21/18 1357    Education provided  Yes    Education Details  Exercise form    Person(s) Educated  Patient    Methods  Explanation;Demonstration;Verbal cues;Tactile cues    Comprehension  Verbalized understanding;Returned demonstration;Verbal cues required;Tactile cues required       PT Short Term Goals - 02/23/18 1546      PT SHORT TERM GOAL #1   Title  Pt will be independent with HEP in order to improve strength and decrease back pain in order to improve pain-free function at home and work.     Time  4    Period  Weeks    Status  New        PT Long Term Goals - 02/23/18 1547      PT LONG TERM GOAL #1   Title  Pt will decrease worst back pain as reported on NPRS by at least 2 points in order to demonstrate clinically significant reduction in back pain.     Baseline  02/22/18 5/10    Time  8    Period  Weeks    Status  New      PT LONG TERM GOAL #2   Title  Pt will improve BERG by at least 3 points in order to demonstrate clinically significant improvement in balance    Baseline  02/22/18 37/56    Time  8    Period  Weeks    Status  New      PT LONG TERM GOAL #3   Title  Pt will increase 10MWT by at least 0.13 m/s in order to demonstrate clinically significant improvement in community ambulation.     Baseline  02/22/18 16sec    Time  8    Period  Weeks    Status  New      PT LONG TERM GOAL #4   Title  Pt will decrease 5TSTS by at least 3 seconds in order to demonstrate clinically significant improvement in LE strength    Baseline  02/22/18 19.5sec    Time  8     Period  Weeks    Status  New      PT  LONG TERM GOAL #5   Title  Patient will demonstrate 10sec or less for TUG test to demonstrate decreased fall risk    Baseline  02/22/18 12.5sec    Time  8    Period  Weeks    Status  New            Plan - 03/21/18 1429    Clinical Impression Statement  PT was able to progress therex d/t patient having decreased pain overall. Patient ale to complete therex with accuracy following PT demo and cuing. PT educated patient on importance of LE strength on body mechanics to prevent form LBP, which patient verbalized understanding of.     Rehab Potential  Fair    Clinical Impairments Affecting Rehab Potential  Negative: Chronic condition, co-morbidities. Positive: motivated to improve, good results from previous PT.     PT Frequency  2x / week    PT Duration  8 weeks    PT Treatment/Interventions  ADLs/Self Care Home Management;Cryotherapy;Electrical Stimulation;Moist Heat;Traction;Ultrasound;DME Instruction;Gait training;Stair training;Functional mobility training;Therapeutic activities;Therapeutic exercise;Balance training;Neuromuscular re-education;Patient/family education;Manual techniques;Passive range of motion;Dry needling;Energy conservation    PT Next Visit Plan  core and hip strengthening    PT Home Exercise Plan  bridge, hamstring stretch, lumbar ext    Consulted and Agree with Plan of Care  Patient       Patient will benefit from skilled therapeutic intervention in order to improve the following deficits and impairments:  Abnormal gait, Decreased activity tolerance, Decreased balance, Decreased coordination, Decreased endurance, Decreased mobility, Decreased knowledge of use of DME, Decreased range of motion, Decreased strength, Difficulty walking, Hypomobility, Increased fascial restricitons, Increased muscle spasms, Impaired perceived functional ability, Impaired flexibility, Impaired sensation, Improper body mechanics, Postural dysfunction,  Obesity, Pain  Visit Diagnosis: Chronic midline low back pain without sciatica     Problem List Patient Active Problem List   Diagnosis Date Noted  . Encounter for general adult medical examination with abnormal findings 01/18/2018  . Abnormal liver function 01/18/2018  . S/P laparoscopic cholecystectomy 01/18/2018  . Bradycardia 01/18/2018  . Cutaneous candidiasis 01/18/2018  . Oral candidiasis 01/18/2018  . Septic shock ([place]) 01/08/2018  . Insect bite of right lower leg 09/25/2017  . Atopic dermatitis 09/25/2017  . PAD (peripheral artery disease) ([place]) 11/24/2015  . Type 2 diabetes mellitus with complication, without long-term current use of insulin ([place]) 11/24/2015  . Hyperlipidemia 11/24/2015  . Essential hypertension 11/24/2015  . PVD (peripheral vascular disease) ([place]) 10/29/2015   Shelton Silvas PT, DPT Shelton Silvas 03/21/2018, 2:32 PM  [place] [facility] [address], [place], [address] Phone: [phone]   Fax:  [phone]  Name: Allen [name] MRN: [id number] Date of Birth: [birth year]

## 2018-03-23 ENCOUNTER — Ambulatory Visit: Payer: Medicare Other | Admitting: Physical Therapy

## 2018-03-23 ENCOUNTER — Encounter: Payer: Self-pay | Admitting: Physical Therapy

## 2018-03-23 DIAGNOSIS — G8929 Other chronic pain: Secondary | ICD-10-CM

## 2018-03-23 DIAGNOSIS — M545 Low back pain: Secondary | ICD-10-CM | POA: Diagnosis not present

## 2018-03-23 NOTE — Therapy (Signed)
[place] [facility] [address], [place], [address] Phone: [phone]   Fax:  [phone]  Physical Therapy Treatment  Patient Details  Name: Allen [name] MRN: [id number] Date of Birth: 1940/05/05 Referring Provider (PT): Meeler   Encounter Date: 03/23/2018  PT End of Session - 03/23/18 1625    Visit Number  7    Number of Visits  17    Date for PT Re-Evaluation  04/19/18    Authorization Type  medicare    Authorization Time Period  7/10    PT Start Time  0400    PT Stop Time  0445    PT Time Calculation (min)  45 min    Activity Tolerance  Patient tolerated treatment well    Behavior During Therapy  [facility] for tasks assessed/performed       Past Medical History:  Diagnosis Date  . Coronary artery disease   . Diabetes mellitus without complication ([place])   . Heart disease   . Hypertension   . MI (myocardial infarction) [facility])     Past Surgical History:  Procedure Laterality Date  . CARDIAC CATHETERIZATION  1989  . CARDIAC CATHETERIZATION  1990  . GALLBLADDER SURGERY    . LEG SURGERY Right    stent placement    There were no vitals filed for this visit.  Subjective Assessment - 03/23/18 1606    Subjective  Patient reports worse back pain this session 5/10, with insideous onset. Patient reports he did not go to sleep until 3:30am but his back began hurting this morning when he work up after 11am. Patient reports his pain is "straight across the low back and is a dull ache"    Pertinent History  Patient is a 58 yera old male reporting with chronic LBP (5 years). Patient reports he had gall bladder surgery 01/08/18 and PT was suggested to him through that experience Patient reports his LBP is intermittent and comes and goes "randomly", but reports he is more than likely to have pain when he completes STS or is in standing position for prolonged time. Pain is dull/ache across LB that radiates into post, upper LE's  with no burning/tingling. 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 Reports worst pain 5/10 and best 0/10 over the past week. Patient has a SPC that he reports he has used "off and on" over the past year to help his balance which he reports "is horrible". Patient reports he feels very unsteady on his feet but feels "a little better with his cane". Patient reports no falls onto the ground, but does admit to many instances of "catching himself" with his UE to prevent falls. Falls usually d/t small dog, and tripping on rugs and "missing his step". Denies LOC or injury from near falls/LOB. Pt denies N/V, unexplained weight fluctuation, B&B changes, saddle paresthesia, fever, night sweats, or unrelenting night pain at this time. Patient does report 50lb weight loss over the past year with 16 hour fasting, with 2x/week 20hour fasting, per MD. Patient does report some B&B changes since beginning metformin last year, PT encouraged him to discuss this with PCP as well.     Limitations  Walking;Lifting;House hold activities;Standing    How long can you sit comfortably?  unlimited    How long can you stand comfortably?  Standing for any period of time increases pain    How long can you walk comfortably?  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    Patient Stated Goals  Improve balance to feel safer at home, be able to walk without pain    Pain Onset  More than a month ago    Pain Onset  More than a month ago       Manual: - STM withtrigger point releaseto bilat QL and lumbar paraspinals with increased time spent on L QL/lumbar paraspinals - L2-S1 grade III-IV CPA 30sec bouts 4 bouts each segment; one round in supine, one in prone prop  - Lumbar traction with theraball 10sec traction; 10sec relax x10 (educated  patient on self traction with couch)   Ther-Ex:  Nustep L2 28min Lower trunk rotation 20x with 3 sec hold Supine glute stretch 2x 33min hold each side Prone press up x10 3sec hold with min cuing to "keep belly on table" instructed to complete in morning if he has increased pain to attempt reduction Palloff press rotation 5# 3x 10 each direction min cuing for proper ROM                       PT Education - 03/23/18 1625    Education provided  Yes    Education Details  Exercise form; self traction     Person(s) Educated  Patient    Methods  Explanation;Verbal cues;Demonstration    Comprehension  Verbalized understanding;Returned demonstration;Verbal cues required       PT Short Term Goals - 02/23/18 1546      PT SHORT TERM GOAL #1   Title  Pt will be independent with HEP in order to improve strength and decrease back pain in order to improve pain-free function at home and work.     Time  4    Period  Weeks    Status  New        PT Long Term Goals - 02/23/18 1547      PT LONG TERM GOAL #1   Title  Pt will decrease worst back pain as reported on NPRS by at least 2 points in order to demonstrate clinically significant reduction in back pain.     Baseline  02/22/18 5/10    Time  8    Period  Weeks    Status  New      PT LONG TERM GOAL #2   Title  Pt will improve BERG by at least 3 points in order to demonstrate clinically significant improvement in balance    Baseline  02/22/18 37/56    Time  8    Period  Weeks    Status  New      PT LONG TERM GOAL #3   Title  Pt will increase 10MWT by at least 0.13 m/s in order to demonstrate clinically significant improvement in community ambulation.     Baseline  02/22/18 16sec    Time  8    Period  Weeks    Status  New      PT LONG TERM GOAL #4   Title  Pt will decrease 5TSTS by at least 3 seconds in order to demonstrate clinically significant improvement in LE strength    Baseline  02/22/18 19.5sec    Time  8     Period  Weeks    Status  New  PT LONG TERM GOAL #5   Title  Patient will demonstrate 10sec or less for TUG test to demonstrate decreased fall risk    Baseline  02/22/18 12.5sec    Time  8    Period  Weeks    Status  New            Plan - 03/23/18 1632    Clinical Impression Statement  Following manual and therex stretching patient reports only 1/10 pain. Patient is pleased with pain reduction and is able to complete palloff press rotation following without increased pain. PT encouraged HEP compliance wio maintain pain reduction and motion    Rehab Potential  Fair    Clinical Impairments Affecting Rehab Potential  Negative: Chronic condition, co-morbidities. Positive: motivated to improve, good results from previous PT.     PT Frequency  2x / week    PT Duration  8 weeks    PT Treatment/Interventions  ADLs/Self Care Home Management;Cryotherapy;Electrical Stimulation;Moist Heat;Traction;Ultrasound;DME Instruction;Gait training;Stair training;Functional mobility training;Therapeutic activities;Therapeutic exercise;Balance training;Neuromuscular re-education;Patient/family education;Manual techniques;Passive range of motion;Dry needling;Energy conservation    PT Next Visit Plan  core and hip strengthening    PT Home Exercise Plan  bridge, hamstring stretch, lumbar ext    Consulted and Agree with Plan of Care  Patient       Patient will benefit from skilled therapeutic intervention in order to improve the following deficits and impairments:  Abnormal gait, Decreased activity tolerance, Decreased balance, Decreased coordination, Decreased endurance, Decreased mobility, Decreased knowledge of use of DME, Decreased range of motion, Decreased strength, Difficulty walking, Hypomobility, Increased fascial restricitons, Increased muscle spasms, Impaired perceived functional ability, Impaired flexibility, Impaired sensation, Improper body mechanics, Postural dysfunction, Obesity, Pain  Visit  Diagnosis: Chronic midline low back pain without sciatica     Problem List Patient Active Problem List   Diagnosis Date Noted  . Encounter for general adult medical examination with abnormal findings 01/18/2018  . Abnormal liver function 01/18/2018  . S/P laparoscopic cholecystectomy 01/18/2018  . Bradycardia 01/18/2018  . Cutaneous candidiasis 01/18/2018  . Oral candidiasis 01/18/2018  . Septic shock ([place]) 01/08/2018  . Insect bite of right lower leg 09/25/2017  . Atopic dermatitis 09/25/2017  . PAD (peripheral artery disease) ([place]) 11/24/2015  . Type 2 diabetes mellitus with complication, without long-term current use of insulin ([place]) 11/24/2015  . Hyperlipidemia 11/24/2015  . Essential hypertension 11/24/2015  . PVD (peripheral vascular disease) ([place]) 10/29/2015   Shelton Silvas PT, DPT Shelton Silvas 03/23/2018, 4:40 PM  [place] [facility] [address] [place], [address] Phone: [phone]   Fax:  [phone]  Name: Allen [name] MRN: [id number] Date of Birth: Jun 16, 1940

## 2018-03-24 ENCOUNTER — Ambulatory Visit: Payer: Medicare Other | Admitting: Cardiovascular Disease

## 2018-03-27 ENCOUNTER — Ambulatory Visit: Payer: Medicare Other | Admitting: Physical Therapy

## 2018-03-27 ENCOUNTER — Encounter: Payer: Self-pay | Admitting: Physical Therapy

## 2018-03-27 DIAGNOSIS — G8929 Other chronic pain: Secondary | ICD-10-CM | POA: Diagnosis not present

## 2018-03-27 DIAGNOSIS — M545 Low back pain: Principal | ICD-10-CM

## 2018-03-27 NOTE — Therapy (Signed)
[facility] [address], [place], [address] Phone: [phone]   Fax:  [phone]  Physical Therapy Treatment  Patient Details  Name: Allen [name] MRN: [id number] Date of Birth: 1941-02-14 Referring Provider (PT): Meeler   Encounter Date: 03/27/2018  PT End of Session - 03/27/18 1312    Visit Number  8    Number of Visits  17    Date for PT Re-Evaluation  04/19/18    Authorization Type  medicare    Authorization Time Period  8/10    PT Start Time  0100    PT Stop Time  0145    PT Time Calculation (min)  45 min    Activity Tolerance  Patient tolerated treatment well    Behavior During Therapy  [facility] for tasks assessed/performed       Past Medical History:  Diagnosis Date  . Coronary artery disease   . Diabetes mellitus without complication ([place])   . Heart disease   . Hypertension   . MI (myocardial infarction) [facility])     Past Surgical History:  Procedure Laterality Date  . CARDIAC CATHETERIZATION  1989  . CARDIAC CATHETERIZATION  1990  . GALLBLADDER SURGERY    . LEG SURGERY Right    stent placement    There were no vitals filed for this visit.  Subjective Assessment - 03/27/18 1307    Subjective  Patient reports 3/10 low back pain this session. Patient reports that he felt better following last session with some soreness, and that his pain has stayed 2-3/10 since. Patient reports some compliance with HEP.     Pertinent History  Patient is a 61 yera old male reporting with chronic LBP (5 years). Patient reports he had gall bladder surgery 01/08/18 and PT was suggested to him through that experience Patient reports his LBP is intermittent and comes and goes "randomly", but reports he is more than likely to have pain when he completes STS or is in standing position for prolonged time. Pain is dull/ache across LB that radiates into post, upper LE's with no burning/tingling. 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 Reports worst pain 5/10 and best 0/10 over the past week. Patient has a SPC that he reports he has used "off and on" over the past year to help his balance which he reports "is horrible". Patient reports he feels very unsteady on his feet but feels "a little better with his cane". Patient reports no falls onto the ground, but does admit to many instances of "catching himself" with his UE to prevent falls. Falls usually d/t small dog, and tripping on rugs and "missing his step". Denies LOC or injury from near falls/LOB. Pt denies N/V, unexplained weight fluctuation, B&B changes, saddle paresthesia, fever, night sweats, or unrelenting night pain at this time. Patient does report 50lb weight loss over the past year with 16 hour fasting, with 2x/week 20hour fasting, per MD. Patient does report some B&B changes since beginning metformin last year, PT encouraged him to discuss this with PCP as well.     Limitations  Walking;Lifting;House hold activities;Standing    How long can you sit comfortably?  unlimited    How long can you stand comfortably?  Standing for any period of time increases pain    How long can you walk comfortably?  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    Patient Stated Goals  Improve balance to feel safer at home, be able to walk without pain    Pain Onset  More than a month ago    Pain Onset  More than a month ago            Manual: - L2-S1 grade III-IV CPA 30sec bouts 4 bouts each segment; one round in supine, one in prone prop - Lumbar traction with theraball 10sec traction; 10sec relax x10 - Glute/LB stretch supine 2x 30sec hold each side  Ther-Ex:  Nustep L3 67min Lower trunk rotation 20x with 3 sec hold Prone press up 2 x10 3sec (before and after manual  techniques hold with min cuing to "keep belly on table" with good carry over Seated marching on theraball 2x 10 each LE with cuing for maintained core contraction to prevent from theraball movement Theraball oblique twists 2x 10 each side wit TC initially for proper form with good carry over following Matrix hip abd 25# 3x 10 each direction with min cuing initially for posture and eccentric control with good carry over following FABER stretch x 60sec hold each LE                 PT Education - 03/27/18 1311    Education provided  Yes    Education Details  Exercise form    Person(s) Educated  Patient    Methods  Explanation;Demonstration;Verbal cues    Comprehension  Verbalized understanding;Returned demonstration;Verbal cues required       PT Short Term Goals - 02/23/18 1546      PT SHORT TERM GOAL #1   Title  Pt will be independent with HEP in order to improve strength and decrease back pain in order to improve pain-free function at home and work.     Time  4    Period  Weeks    Status  New        PT Long Term Goals - 02/23/18 1547      PT LONG TERM GOAL #1   Title  Pt will decrease worst back pain as reported on NPRS by at least 2 points in order to demonstrate clinically significant reduction in back pain.     Baseline  02/22/18 5/10    Time  8    Period  Weeks    Status  New      PT LONG TERM GOAL #2   Title  Pt will improve BERG by at least 3 points in order to demonstrate clinically significant improvement in balance    Baseline  02/22/18 37/56    Time  8    Period  Weeks    Status  New      PT LONG TERM GOAL #3   Title  Pt will increase 10MWT by at least 0.13 m/s in order to demonstrate clinically significant improvement in community ambulation.     Baseline  02/22/18 16sec    Time  8    Period  Weeks    Status  New      PT LONG TERM GOAL #4   Title  Pt will decrease 5TSTS by at least 3 seconds in order to demonstrate clinically significant  improvement in LE strength    Baseline  02/22/18 19.5sec    Time  8    Period  Weeks  Status  New      PT LONG TERM GOAL #5   Title  Patient will demonstrate 10sec or less for TUG test to demonstrate decreased fall risk    Baseline  02/22/18 12.5sec    Time  8    Period  Weeks    Status  New            Plan - 03/27/18 1323    Clinical Impression Statement  Following manual techniques patient reports decreased low back pain 0/10. PT progressed therex to increase core and hip stability demand, which patient is able to complete with accuracy following PT cuing.     Rehab Potential  Fair    Clinical Impairments Affecting Rehab Potential  Negative: Chronic condition, co-morbidities. Positive: motivated to improve, good results from previous PT.     PT Frequency  2x / week    PT Duration  8 weeks    PT Treatment/Interventions  ADLs/Self Care Home Management;Cryotherapy;Electrical Stimulation;Moist Heat;Traction;Ultrasound;DME Instruction;Gait training;Stair training;Functional mobility training;Therapeutic activities;Therapeutic exercise;Balance training;Neuromuscular re-education;Patient/family education;Manual techniques;Passive range of motion;Dry needling;Energy conservation    PT Next Visit Plan  core and hip strengthening    PT Home Exercise Plan  bridge, hamstring stretch, lumbar ext    Consulted and Agree with Plan of Care  Patient       Patient will benefit from skilled therapeutic intervention in order to improve the following deficits and impairments:  Abnormal gait, Decreased activity tolerance, Decreased balance, Decreased coordination, Decreased endurance, Decreased mobility, Decreased knowledge of use of DME, Decreased range of motion, Decreased strength, Difficulty walking, Hypomobility, Increased fascial restricitons, Increased muscle spasms, Impaired perceived functional ability, Impaired flexibility, Impaired sensation, Improper body mechanics, Postural dysfunction,  Obesity, Pain  Visit Diagnosis: Chronic midline low back pain without sciatica     Problem List Patient Active Problem List   Diagnosis Date Noted  . Encounter for general adult medical examination with abnormal findings 01/18/2018  . Abnormal liver function 01/18/2018  . S/P laparoscopic cholecystectomy 01/18/2018  . Bradycardia 01/18/2018  . Cutaneous candidiasis 01/18/2018  . Oral candidiasis 01/18/2018  . Septic shock ([place]) 01/08/2018  . Insect bite of right lower leg 09/25/2017  . Atopic dermatitis 09/25/2017  . PAD (peripheral artery disease) ([place]) 11/24/2015  . Type 2 diabetes mellitus with complication, without long-term current use of insulin ([place]) 11/24/2015  . Hyperlipidemia 11/24/2015  . Essential hypertension 11/24/2015  . PVD (peripheral vascular disease) ([place]) 10/29/2015   Shelton Silvas PT, DPT Shelton Silvas 03/27/2018, 2:14 PM  [facility] [address] [place], [address] Phone: [phone]   Fax:  [phone]  Name: Allen [name] MRN: [id number] Date of Birth: October 07, 1940

## 2018-03-30 ENCOUNTER — Ambulatory Visit: Payer: Medicare Other | Admitting: Physical Therapy

## 2018-03-30 ENCOUNTER — Encounter: Payer: Self-pay | Admitting: Physical Therapy

## 2018-03-30 DIAGNOSIS — G8929 Other chronic pain: Secondary | ICD-10-CM | POA: Diagnosis not present

## 2018-03-30 DIAGNOSIS — M545 Low back pain, unspecified: Secondary | ICD-10-CM

## 2018-03-30 NOTE — Therapy (Signed)
[place] [facility] [address] [place], [address] Phone: [phone]   Fax:  [phone]  Physical Therapy Treatment  Patient Details  Name: Allen [name] MRN: [id number] Date of Birth: 1940-07-12 Referring Provider (PT): Meeler   Encounter Date: 03/30/2018  PT End of Session - 03/30/18 1552    Visit Number  9    Number of Visits  17    Date for PT Re-Evaluation  04/19/18    Authorization Type  medicare    Authorization Time Period  9/10    PT Start Time  0345    PT Stop Time  0430    PT Time Calculation (min)  45 min    Equipment Utilized During Treatment  Gait belt    Activity Tolerance  Patient tolerated treatment well    Behavior During Therapy  [facility] for tasks assessed/performed       Past Medical History:  Diagnosis Date  . Coronary artery disease   . Diabetes mellitus without complication ([place])   . Heart disease   . Hypertension   . MI (myocardial infarction) [facility])     Past Surgical History:  Procedure Laterality Date  . CARDIAC CATHETERIZATION  1989  . CARDIAC CATHETERIZATION  1990  . GALLBLADDER SURGERY    . LEG SURGERY Right    stent placement    There were no vitals filed for this visit.  Subjective Assessment - 03/30/18 1549    Subjective  Patient reports only 1-2/10 pain today. Compliance with HEP with no questions or concerns.     Pertinent History  Patient is a 43 yera old male reporting with chronic LBP (5 years). Patient reports he had gall bladder surgery 01/08/18 and PT was suggested to him through that experience Patient reports his LBP is intermittent and comes and goes "randomly", but reports he is more than likely to have pain when he completes STS or is in standing position for prolonged time. Pain is dull/ache across LB that radiates into post, upper LE's with no burning/tingling. 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  Reports worst pain 5/10 and best 0/10 over the past week. Patient has a SPC that he reports he has used "off and on" over the past year to help his balance which he reports "is horrible". Patient reports he feels very unsteady on his feet but feels "a little better with his cane". Patient reports no falls onto the ground, but does admit to many instances of "catching himself" with his UE to prevent falls. Falls usually d/t small dog, and tripping on rugs and "missing his step". Denies LOC or injury from near falls/LOB. Pt denies N/V, unexplained weight fluctuation, B&B changes, saddle paresthesia, fever, night sweats, or unrelenting night pain at this time. Patient does report 50lb weight loss over the past year with 16 hour fasting, with 2x/week 20hour fasting, per MD. Patient does report some B&B changes since beginning metformin last year, PT encouraged him to discuss this with PCP as well.     Limitations  Walking;Lifting;House hold activities;Standing    How long can you sit comfortably?  unlimited    How long can you stand comfortably?  Standing for any period of time increases pain    How long can you walk comfortably?  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 ON FOAM: feet apart 30sec feet together 5sec - Alt toe taps on 6in step 3x 6 (eac side) with b/l UE balance support only at handrail, cuing for tap with "egg" cue - Multiple  bouts of standing ball toss/catch with PT tossing ball outside patient BOS  - MATRIX hip abd 25# 3x 10 with min cuing for proper posture with good carry over following                        PT Education - 03/30/18 1551    Education provided  Yes    Education Details  Exercise form    Person(s) Educated  Patient    Methods  Explanation;Demonstration;Verbal cues;Tactile cues    Comprehension  Verbalized understanding;Returned demonstration;Verbal cues required;Tactile cues required       PT Short Term Goals - 02/23/18 1546      PT SHORT TERM GOAL #1   Title  Pt will be independent with HEP in order to improve strength and decrease back pain in order to improve pain-free function at home and work.     Time  4    Period  Weeks    Status  New        PT Long Term Goals - 02/23/18 1547      PT LONG TERM GOAL #1   Title  Pt will decrease worst back pain as reported on NPRS by at least 2 points in order to demonstrate clinically significant reduction in back pain.     Baseline  02/22/18 5/10    Time  8    Period  Weeks    Status  New      PT LONG TERM GOAL #2   Title  Pt will improve BERG by at least 3 points in order to demonstrate clinically significant improvement in balance    Baseline  02/22/18 37/56    Time  8    Period  Weeks    Status  New      PT LONG TERM GOAL #3   Title  Pt will increase 10MWT by at least 0.13 m/s in order to demonstrate clinically significant improvement in community ambulation.     Baseline  02/22/18 16sec    Time  8    Period  Weeks    Status  New      PT LONG TERM GOAL #4   Title  Pt will decrease 5TSTS by at least 3 seconds in order to demonstrate clinically significant improvement in LE strength    Baseline  02/22/18 19.5sec    Time  8    Period  Weeks    Status  New      PT LONG TERM GOAL #5   Title  Patient will demonstrate 10sec or less for TUG test to demonstrate decreased fall risk    Baseline  02/22/18  12.5sec    Time   8    Period  Weeks    Status  New            Plan - 03/30/18 1616    Clinical Impression Statement  Patient is pleased with decreased pain. Reports he is still having difficulty standing still and feeling steady. PT continued therex for LE strengthening with education on the importance of LE strength as it relates to neuropathy and balance. Patient is able to complete all therex with accuracy following PT cuing. PT progressed therex with standing static and dynamic balance demand as well, which patient is able to do with CGA for safety in almost all conditions (see therapy note) PT will continue progression as able.     Rehab Potential  Fair    Clinical Impairments Affecting Rehab Potential  Negative: Chronic condition, co-morbidities. Positive: motivated to improve, good results from previous PT.     PT Frequency  2x / week    PT Duration  8 weeks    PT Treatment/Interventions  ADLs/Self Care Home Management;Cryotherapy;Electrical Stimulation;Moist Heat;Traction;Ultrasound;DME Instruction;Gait training;Stair training;Functional mobility training;Therapeutic activities;Therapeutic exercise;Balance training;Neuromuscular re-education;Patient/family education;Manual techniques;Passive range of motion;Dry needling;Energy conservation    PT Next Visit Plan  core and hip strengthening    PT Home Exercise Plan  bridge, hamstring stretch, lumbar ext    Consulted and Agree with Plan of Care  Patient       Patient will benefit from skilled therapeutic intervention in order to improve the following deficits and impairments:  Abnormal gait, Decreased activity tolerance, Decreased balance, Decreased coordination, Decreased endurance, Decreased mobility, Decreased knowledge of use of DME, Decreased range of motion, Decreased strength, Difficulty walking, Hypomobility, Increased fascial restricitons, Increased muscle spasms, Impaired perceived functional ability, Impaired flexibility, Impaired sensation,  Improper body mechanics, Postural dysfunction, Obesity, Pain  Visit Diagnosis: Chronic midline low back pain without sciatica     Problem List Patient Active Problem List   Diagnosis Date Noted  . Encounter for general adult medical examination with abnormal findings 01/18/2018  . Abnormal liver function 01/18/2018  . S/P laparoscopic cholecystectomy 01/18/2018  . Bradycardia 01/18/2018  . Cutaneous candidiasis 01/18/2018  . Oral candidiasis 01/18/2018  . Septic shock ([place]) 01/08/2018  . Insect bite of right lower leg 09/25/2017  . Atopic dermatitis 09/25/2017  . PAD (peripheral artery disease) ([place]) 11/24/2015  . Type 2 diabetes mellitus with complication, without long-term current use of insulin ([place]) 11/24/2015  . Hyperlipidemia 11/24/2015  . Essential hypertension 11/24/2015  . PVD (peripheral vascular disease) ([place]) 10/29/2015   Shelton Silvas PT, DPT Shelton Silvas 03/30/2018, 4:23 PM  [facility] [facility] [address], [place], [address] Phone: [phone]   Fax:  [phone]  Name: Allen [name] MRN: [id number] Date of Birth: 07/16/1940

## 2018-04-04 ENCOUNTER — Encounter: Payer: Self-pay | Admitting: Physical Therapy

## 2018-04-04 ENCOUNTER — Ambulatory Visit: Payer: Medicare Other | Admitting: Physical Therapy

## 2018-04-04 DIAGNOSIS — M545 Low back pain, unspecified: Secondary | ICD-10-CM

## 2018-04-04 DIAGNOSIS — G8929 Other chronic pain: Secondary | ICD-10-CM | POA: Diagnosis not present

## 2018-04-04 NOTE — Therapy (Addendum)
[facility] [address] [place], [address] Phone: [phone]   Fax:  [phone]  Physical Therapy Treatment/ Progress Note Reporting Period 02/22/18 - 04/04/18  Patient Details  Name: Allen [name] MRN: [id number] Date of Birth: 07/20/1940 Referring Provider (PT): Meeler   Encounter Date: 04/04/2018  PT End of Session - 04/04/18 1309    Visit Number  10    Number of Visits  17    Date for PT Re-Evaluation  04/19/18    Authorization Type  medicare    Authorization Time Period  10/10    PT Start Time  0103    PT Stop Time  0145    PT Time Calculation (min)  42 min    Activity Tolerance  Patient tolerated treatment well    Behavior During Therapy  [facility] for tasks assessed/performed       Past Medical History:  Diagnosis Date  . Coronary artery disease   . Diabetes mellitus without complication ([place])   . Heart disease   . Hypertension   . MI (myocardial infarction) [facility])     Past Surgical History:  Procedure Laterality Date  . CARDIAC CATHETERIZATION  1989  . CARDIAC CATHETERIZATION  1990  . GALLBLADDER SURGERY    . LEG SURGERY Right    stent placement    There were no vitals filed for this visit.  Subjective Assessment - 04/04/18 1307    Subjective  Patient reports decreased back pain today 1-2/10. Patient reports his R foot is starting to give im some pain, and reports this is an on and off issue he has had for a while. Patient reports he has a brace for this pain that he will "have to put on when he gets home".     Pertinent History  Patient is a 13 yera old male reporting with chronic LBP (5 years). Patient reports he had gall bladder surgery 01/08/18 and PT was suggested to him through that experience Patient reports his LBP is intermittent and comes and goes "randomly", but reports he is more than likely to have pain when he completes STS or is in standing position for prolonged time. Pain is  dull/ache across LB that radiates into post, upper LE's with no burning/tingling. 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 Reports worst pain 5/10 and best 0/10 over the past week. Patient has a SPC that he reports he has used "off and on" over the past year to help his balance which he reports "is horrible". Patient reports he feels very unsteady on his feet but feels "a little better with his cane". Patient reports no falls onto the ground, but does admit to many instances of "catching himself" with his UE to prevent falls. Falls usually d/t small dog, and tripping on rugs and "missing his step". Denies LOC or injury from near falls/LOB. Pt denies N/V, unexplained weight fluctuation, B&B changes, saddle paresthesia, fever, night sweats, or unrelenting night pain at this time. Patient does report 50lb weight loss over the past year with 16 hour fasting, with 2x/week 20hour fasting, per MD. Patient does report some B&B changes since beginning metformin last year, PT encouraged him to discuss this with PCP as well.     Limitations  Walking;Lifting;House hold activities;Standing    How long can you sit comfortably?  unlimited    How long can you stand comfortably?  Standing for any period of  time increases pain    How long can you walk comfortably?  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    Patient Stated Goals  Improve balance to feel safer at home, be able to walk without pain    Pain Onset  More than a month ago          Ther-Ex: - Nustep L3 48min - 5x STS test 3 trials best time 14.7sec - encouraging without UE support - TUG trials 2x best time 9.5sec - 10MWT 2 trials best time 10sec 1.7m/s with SPC - BERG balance assessment with supervision for safety with mat table behind patient and rest  breaks throughout; difficulty with SLS in static stance and stair tapping dynamic balance - DGI with CGA for safety, patient demonstrating increased difficulty with dynamic balance tasks - HEP review with additions of standing hip abd and ext with RTB (demo'd by PT), STS without w/o UE support, and visual review of bridge and hamstring stretch; patient verbalizes and demonstrates understanding                        PT Education - 04/04/18 1407    Education provided  Yes    Education Details  HEP update, POC update    Person(s) Educated  Patient    Methods  Explanation;Demonstration;Verbal cues    Comprehension  Verbalized understanding;Returned demonstration;Verbal cues required       PT Short Term Goals - 02/23/18 1546      PT SHORT TERM GOAL #1   Title  Pt will be independent with HEP in order to improve strength and decrease back pain in order to improve pain-free function at home and work.     Time  4    Period  Weeks    Status  New        PT Long Term Goals - 04/04/18 1310      PT LONG TERM GOAL #1   Title  Pt will decrease worst back pain as reported on NPRS by at least 2 points in order to demonstrate clinically significant reduction in back pain.     Baseline  04/04/18 3/10    Time  8    Period  Weeks    Status  Achieved      PT LONG TERM GOAL #2   Title  Pt will improve BERG by at least 3 points in order to demonstrate clinically significant improvement in balance    Baseline  04/04/18 44/56    Time  8    Period  Weeks    Status  Achieved      PT LONG TERM GOAL #3   Title  Pt will increase 10MWT by at least 0.13 m/s in order to demonstrate clinically significant improvement in community ambulation.     Baseline  04/04/18 1.0 m/s (02/22/17 .12m/s)    Time  8    Period  Weeks    Status  Achieved      PT LONG TERM GOAL #4   Title  Pt will decrease 5TSTS time to 10sec without UE support in order to demonstrate age matched norms    Baseline   04/04/18 14.7sec w/ UE support    Time  8    Period  Weeks    Status  Revised  PT LONG TERM GOAL #5   Title  Patient will demonstrate 10sec or less for TUG test to demonstrate decreased fall risk    Baseline  04/04/18 9.5sec    Time  8    Period  Weeks    Status  Achieved      Additional Long Term Goals   Additional Long Term Goals  Yes      PT LONG TERM GOAL #6   Title  Pt will improve DGI by at least 3 points in order to demonstrate clinically significant improvement in balance and decreased risk for falls    Baseline  04/04/18 17/24    Time  8    Period  Weeks    Status  New            Plan - 04/04/18 1415    Clinical Impression Statement  PT reassessed patient goals this session. Patient is making good progress toward goals with gains in LE strength and static balance (exception of SLS). Patient with remaining deficits of dynamic balance tasks and remaining LE strength deficits. atient will continue to benefit from skilled PT to address remaining deficits to decrease fall risk and increase ind/QOL.     Rehab Potential  Fair    Clinical Impairments Affecting Rehab Potential  Negative: Chronic condition, co-morbidities. Positive: motivated to improve, good results from previous PT.     PT Frequency  2x / week    PT Duration  8 weeks    PT Treatment/Interventions  ADLs/Self Care Home Management;Cryotherapy;Electrical Stimulation;Moist Heat;Traction;Ultrasound;DME Instruction;Gait training;Stair training;Functional mobility training;Therapeutic activities;Therapeutic exercise;Balance training;Neuromuscular re-education;Patient/family education;Manual techniques;Passive range of motion;Dry needling;Energy conservation    PT Next Visit Plan  core and hip strengthening    PT Home Exercise Plan  bridge, hamstring stretch, lumbar ext    Consulted and Agree with Plan of Care  Patient       Patient will benefit from skilled therapeutic intervention in order to improve the  following deficits and impairments:  Abnormal gait, Decreased activity tolerance, Decreased balance, Decreased coordination, Decreased endurance, Decreased mobility, Decreased knowledge of use of DME, Decreased range of motion, Decreased strength, Difficulty walking, Hypomobility, Increased fascial restricitons, Increased muscle spasms, Impaired perceived functional ability, Impaired flexibility, Impaired sensation, Improper body mechanics, Postural dysfunction, Obesity, Pain  Visit Diagnosis: Chronic midline low back pain without sciatica     Problem List Patient Active Problem List   Diagnosis Date Noted  . Encounter for general adult medical examination with abnormal findings 01/18/2018  . Abnormal liver function 01/18/2018  . S/P laparoscopic cholecystectomy 01/18/2018  . Bradycardia 01/18/2018  . Cutaneous candidiasis 01/18/2018  . Oral candidiasis 01/18/2018  . Septic shock ([place]) 01/08/2018  . Insect bite of right lower leg 09/25/2017  . Atopic dermatitis 09/25/2017  . PAD (peripheral artery disease) ([place]) 11/24/2015  . Type 2 diabetes mellitus with complication, without long-term current use of insulin ([place]) 11/24/2015  . Hyperlipidemia 11/24/2015  . Essential hypertension 11/24/2015  . PVD (peripheral vascular disease) ([place]) 10/29/2015   Shelton Silvas PT, DPT Shelton Silvas 04/04/2018, 2:17 PM  [facility] [address], [place], [address] Phone: [phone]   Fax:  [phone]  Name: Severus Brodzinski MRN: [id number] Date of Birth: February 26, 1940

## 2018-04-06 ENCOUNTER — Ambulatory Visit: Payer: Medicare Other | Admitting: Physical Therapy

## 2018-04-06 ENCOUNTER — Encounter: Payer: Self-pay | Admitting: Physical Therapy

## 2018-04-06 DIAGNOSIS — M545 Low back pain: Principal | ICD-10-CM

## 2018-04-06 DIAGNOSIS — G8929 Other chronic pain: Secondary | ICD-10-CM | POA: Diagnosis not present

## 2018-04-06 NOTE — Therapy (Signed)
[facility] [address], [place], [address] Phone: [phone]   Fax:  [phone]  Physical Therapy Treatment  Patient Details  Name: Allen [name] MRN: [id number] Date of Birth: June 10, 1940 Referring Provider (PT): Meeler   Encounter Date: 04/06/2018  PT End of Session - 04/06/18 0938    Visit Number  11    Number of Visits  17    Date for PT Re-Evaluation  04/19/18    Authorization Type  medicare    Authorization Time Period  1/10    PT Start Time  0315    PT Stop Time  0400    PT Time Calculation (min)  45 min    Equipment Utilized During Treatment  Gait belt    Activity Tolerance  Patient tolerated treatment well    Behavior During Therapy  [facility] for tasks assessed/performed       Past Medical History:  Diagnosis Date  . Coronary artery disease   . Diabetes mellitus without complication ([place])   . Heart disease   . Hypertension   . MI (myocardial infarction) [facility])     Past Surgical History:  Procedure Laterality Date  . CARDIAC CATHETERIZATION  1989  . CARDIAC CATHETERIZATION  1990  . GALLBLADDER SURGERY    . LEG SURGERY Right    stent placement    There were no vitals filed for this visit.  Subjective Assessment - 04/06/18 1520    Subjective  Patient reports no back pain today, which he is happy with. Patient reports compliance with his HEP with no questions or concerns.     Pertinent History  Patient is a 46 yera old male reporting with chronic LBP (5 years). Patient reports he had gall bladder surgery 01/08/18 and PT was suggested to him through that experience Patient reports his LBP is intermittent and comes and goes "randomly", but reports he is more than likely to have pain when he completes STS or is in standing position for prolonged time. Pain is dull/ache across LB that radiates into post, upper LE's with no burning/tingling. 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 Reports worst pain 5/10 and best 0/10 over the past week. Patient has a SPC that he reports he has used "off and on" over the past year to help his balance which he reports "is horrible". Patient reports he feels very unsteady on his feet but feels "a little better with his cane". Patient reports no falls onto the ground, but does admit to many instances of "catching himself" with his UE to prevent falls. Falls usually d/t small dog, and tripping on rugs and "missing his step". Denies LOC or injury from near falls/LOB. Pt denies N/V, unexplained weight fluctuation, B&B changes, saddle paresthesia, fever, night sweats, or unrelenting night pain at this time. Patient does report 50lb weight loss over the past year with 16 hour fasting, with 2x/week 20hour fasting, per MD. Patient does report some B&B changes since beginning metformin last year, PT encouraged him to discuss this with PCP as well.     Limitations  Walking;Lifting;House hold activities;Standing    How long can you sit comfortably?  unlimited    How long can you stand comfortably?  Standing for any period of time increases pain    How long can you walk comfortably?  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  - Walking with turns and handing ball back and forth to PT with CGA for safety and cuing for proper trunk turn to benefit from exercise - Step over and back over 6in  hurdle 3x 10 with min cuing initially for safety and foot clearance with good carry over following; CGA for safety - Step side to side over 6in hurdle 3x 10 with 2 finger UE support for balance and CGA for safety                       PT Education - 04/06/18 1522    Education provided  Yes    Education Details  Exercise form    Person(s) Educated  Patient    Methods  Explanation;Demonstration;Verbal cues    Comprehension  Verbalized understanding;Returned demonstration;Verbal cues required       PT Short Term Goals - 02/23/18 1546      PT SHORT TERM GOAL #1   Title  Pt will be independent with HEP in order to improve strength and decrease back pain in order to improve pain-free function at home and work.     Time  4    Period  Weeks    Status  New        PT Long Term Goals - 04/04/18 1310      PT LONG TERM GOAL #1   Title  Pt will decrease worst back pain as reported on NPRS by at least 2 points in order to demonstrate clinically significant reduction in back pain.     Baseline  04/04/18 3/10    Time  8    Period  Weeks    Status  Achieved      PT LONG TERM GOAL #2   Title  Pt will improve BERG by at least 3 points in order to demonstrate clinically significant improvement in balance    Baseline  04/04/18 44/56    Time  8    Period  Weeks    Status  Achieved      PT LONG TERM GOAL #3   Title  Pt will increase 10MWT by at least 0.13 m/s in order to demonstrate clinically significant improvement in community ambulation.     Baseline  04/04/18 1.0 m/s (02/22/17 .58m/s)    Time  8    Period  Weeks    Status  Achieved      PT LONG TERM GOAL #4   Title  Pt will decrease 5TSTS time to 10sec without UE support in order to demonstrate age matched norms    Baseline  04/04/18 14.7sec w/ UE support    Time  8    Period  Weeks    Status  Revised      PT LONG TERM GOAL #5   Title  Patient will demonstrate 10sec or less for TUG test  to demonstrate decreased fall  risk    Baseline  04/04/18 9.5sec    Time  8    Period  Weeks    Status  Achieved      Additional Long Term Goals   Additional Long Term Goals  Yes      PT LONG TERM GOAL #6   Title  Pt will improve DGI by at least 3 points in order to demonstrate clinically significant improvement in balance and decreased risk for falls    Baseline  04/04/18 17/24    Time  8    Period  Weeks    Status  New            Plan - 04/06/18 1555    Clinical Impression Statement  PT continued progression for LE strength and dynamic balance progression, which patient is able to complete with accuracy following PT cuing and with gaurding for safety. PT will continue progression as able.     Rehab Potential  Fair    Clinical Impairments Affecting Rehab Potential  Negative: Chronic condition, co-morbidities. Positive: motivated to improve, good results from previous PT.     PT Frequency  2x / week    PT Duration  8 weeks    PT Treatment/Interventions  ADLs/Self Care Home Management;Cryotherapy;Electrical Stimulation;Moist Heat;Traction;Ultrasound;DME Instruction;Gait training;Stair training;Functional mobility training;Therapeutic activities;Therapeutic exercise;Balance training;Neuromuscular re-education;Patient/family education;Manual techniques;Passive range of motion;Dry needling;Energy conservation    PT Next Visit Plan  core and hip strengthening    PT Home Exercise Plan  bridge, hamstring stretch, lumbar ext    Consulted and Agree with Plan of Care  Patient       Patient will benefit from skilled therapeutic intervention in order to improve the following deficits and impairments:  Abnormal gait, Decreased activity tolerance, Decreased balance, Decreased coordination, Decreased endurance, Decreased mobility, Decreased knowledge of use of DME, Decreased range of motion, Decreased strength, Difficulty walking, Hypomobility, Increased fascial restricitons, Increased muscle spasms, Impaired perceived  functional ability, Impaired flexibility, Impaired sensation, Improper body mechanics, Postural dysfunction, Obesity, Pain  Visit Diagnosis: Chronic midline low back pain without sciatica     Problem List Patient Active Problem List   Diagnosis Date Noted  . Encounter for general adult medical examination with abnormal findings 01/18/2018  . Abnormal liver function 01/18/2018  . S/P laparoscopic cholecystectomy 01/18/2018  . Bradycardia 01/18/2018  . Cutaneous candidiasis 01/18/2018  . Oral candidiasis 01/18/2018  . Septic shock ([place]) 01/08/2018  . Insect bite of right lower leg 09/25/2017  . Atopic dermatitis 09/25/2017  . PAD (peripheral artery disease) ([place]) 11/24/2015  . Type 2 diabetes mellitus with complication, without long-term current use of insulin ([place]) 11/24/2015  . Hyperlipidemia 11/24/2015  . Essential hypertension 11/24/2015  . PVD (peripheral vascular disease) ([place]) 10/29/2015   Shelton Silvas PT, DPT Shelton Silvas 04/06/2018, 3:57 PM   [facility] [address], [place], [address] Phone: [phone]   Fax:  [phone]  Name: Allen [name] MRN: [id number] Date of Birth: 1941-01-04

## 2018-04-10 ENCOUNTER — Ambulatory Visit: Payer: Medicare Other | Admitting: Physical Therapy

## 2018-04-10 ENCOUNTER — Encounter: Payer: Self-pay | Admitting: Physical Therapy

## 2018-04-10 DIAGNOSIS — G8929 Other chronic pain: Secondary | ICD-10-CM | POA: Diagnosis not present

## 2018-04-10 DIAGNOSIS — M545 Low back pain, unspecified: Secondary | ICD-10-CM

## 2018-04-10 NOTE — Therapy (Signed)
[facility] [address], [place], [address] Phone: [phone]   Fax:  [phone]  Physical Therapy Treatment  Patient Details  Name: Allen [name] MRN: [id number] Date of Birth: 06/18/1940 Referring Provider (PT): Meeler   Encounter Date: 04/10/2018  PT End of Session - 04/10/18 1433    Visit Number  12    Number of Visits  17    Date for PT Re-Evaluation  04/19/18    Authorization Type  medicare    Authorization Time Period  2/10    PT Start Time  0230    PT Stop Time  0315    PT Time Calculation (min)  45 min    Activity Tolerance  Patient tolerated treatment well    Behavior During Therapy  [facility] for tasks assessed/performed       Past Medical History:  Diagnosis Date  . Coronary artery disease   . Diabetes mellitus without complication ([place])   . Heart disease   . Hypertension   . MI (myocardial infarction) [facility])     Past Surgical History:  Procedure Laterality Date  . CARDIAC CATHETERIZATION  1989  . CARDIAC CATHETERIZATION  1990  . GALLBLADDER SURGERY    . LEG SURGERY Right    stent placement    There were no vitals filed for this visit.  Subjective Assessment - 04/10/18 1434    Subjective  Patient continues to report decreased LBP 1/10 over the weekend, mostly in the am when he gets up. Patient reports min compliance with HEP.     Pertinent History  Patient is a 65 yera old male reporting with chronic LBP (5 years). Patient reports he had gall bladder surgery 01/08/18 and PT was suggested to him through that experience Patient reports his LBP is intermittent and comes and goes "randomly", but reports he is more than likely to have pain when he completes STS or is in standing position for prolonged time. Pain is dull/ache across LB that radiates into post, upper LE's with no burning/tingling. 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  Reports worst pain 5/10 and best 0/10 over the past week. Patient has a SPC that he reports he has used "off and on" over the past year to help his balance which he reports "is horrible". Patient reports he feels very unsteady on his feet but feels "a little better with his cane". Patient reports no falls onto the ground, but does admit to many instances of "catching himself" with his UE to prevent falls. Falls usually d/t small dog, and tripping on rugs and "missing his step". Denies LOC or injury from near falls/LOB. Pt denies N/V, unexplained weight fluctuation, B&B changes, saddle paresthesia, fever, night sweats, or unrelenting night pain at this time. Patient does report 50lb weight loss over the past year with 16 hour fasting, with 2x/week 20hour fasting, per MD. Patient does report some B&B changes since beginning metformin last year, PT encouraged him to discuss this with PCP as well.     Limitations  Walking;Lifting;House hold activities;Standing    How long can you sit comfortably?  unlimited    How long can you stand comfortably?  Standing for any period of time increases pain    How long can you walk comfortably?  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    Patient Stated Goals  Improve balance to feel safer at home, be able to walk without pain    Pain Onset  More than a month ago    Pain Onset  More than a month ago       Ther-Ex: -Nustep L71min; L5 78min  - STS from elevated mat table 3x 10 lowering mat table each set, cuing for full hip ext at stand phase and eccentric lowering (lowering mat to 19in) - Attempted step down from 4in step with patient unable to complete with proper form - Standing abd sliders 3x 10 each side with mini squat position, cuing to maintain mini squat and for eccentric control  - MATRIX hip abd 25# 3x 10 with  increased difficulty with RLE SLS and cuing for posture and eccentric control throughout - Palloff press 5# 3x 10 each side with cuing for posture, eccentric control, and proper ROM with good carry over following - Standing SLS with opposite LE tapping cones called out at random by PT 2x 10 each with unilateral UE support for first set and 2 finger support for second set. CGA for safety with cuing to "lightly tap cone, do not knock it over"                       PT Education - 04/10/18 1436    Education provided  Yes    Education Details  Exercise form    Person(s) Educated  Patient    Methods  Explanation;Demonstration;Verbal cues;Tactile cues    Comprehension  Verbalized understanding;Returned demonstration;Verbal cues required;Tactile cues required       PT Short Term Goals - 02/23/18 1546      PT SHORT TERM GOAL #1   Title  Pt will be independent with HEP in order to improve strength and decrease back pain in order to improve pain-free function at home and work.     Time  4    Period  Weeks    Status  New        PT Long Term Goals - 04/04/18 1310      PT LONG TERM GOAL #1   Title  Pt will decrease worst back pain as reported on NPRS by at least 2 points in order to demonstrate clinically significant reduction in back pain.     Baseline  04/04/18 3/10    Time  8    Period  Weeks    Status  Achieved      PT LONG TERM GOAL #2   Title  Pt will improve BERG by at least 3 points in order to demonstrate clinically significant improvement in balance    Baseline  04/04/18 44/56    Time  8    Period  Weeks    Status  Achieved      PT LONG TERM GOAL #3   Title  Pt will increase 10MWT by at least 0.13 m/s in order to demonstrate clinically significant improvement in community ambulation.     Baseline  04/04/18 1.0 m/s (02/22/17 .31m/s)    Time  8    Period  Weeks    Status  Achieved      PT LONG TERM GOAL #4   Title  Pt will decrease 5TSTS time to 10sec without  UE support in order to demonstrate age matched norms    Baseline  04/04/18 14.7sec w/ UE support  Time  8    Period  Weeks    Status  Revised      PT LONG TERM GOAL #5   Title  Patient will demonstrate 10sec or less for TUG test to demonstrate decreased fall risk    Baseline  04/04/18 9.5sec    Time  8    Period  Weeks    Status  Achieved      Additional Long Term Goals   Additional Long Term Goals  Yes      PT LONG TERM GOAL #6   Title  Pt will improve DGI by at least 3 points in order to demonstrate clinically significant improvement in balance and decreased risk for falls    Baseline  04/04/18 17/24    Time  8    Period  Weeks    Status  New            Plan - 04/10/18 1503    Clinical Impression Statement  PT continued strengthening progression, with patient demonstrating difficulty with lateral stability, but is able to complete with accuracy following PT cuing and modifications. Patient is demonstrating good carry over between sessions, with min cuing required. PT will continue progression as able    Rehab Potential  Fair    Clinical Impairments Affecting Rehab Potential  Negative: Chronic condition, co-morbidities. Positive: motivated to improve, good results from previous PT.     PT Duration  8 weeks    PT Treatment/Interventions  ADLs/Self Care Home Management;Cryotherapy;Electrical Stimulation;Moist Heat;Traction;Ultrasound;DME Instruction;Gait training;Stair training;Functional mobility training;Therapeutic activities;Therapeutic exercise;Balance training;Neuromuscular re-education;Patient/family education;Manual techniques;Passive range of motion;Dry needling;Energy conservation    PT Next Visit Plan  core and hip strengthening    PT Home Exercise Plan  bridge, hamstring stretch, lumbar ext    Consulted and Agree with Plan of Care  Patient       Patient will benefit from skilled therapeutic intervention in order to improve the following deficits and impairments:   Abnormal gait, Decreased activity tolerance, Decreased balance, Decreased coordination, Decreased endurance, Decreased mobility, Decreased knowledge of use of DME, Decreased range of motion, Decreased strength, Difficulty walking, Hypomobility, Increased fascial restricitons, Increased muscle spasms, Impaired perceived functional ability, Impaired flexibility, Impaired sensation, Improper body mechanics, Postural dysfunction, Obesity, Pain  Visit Diagnosis: Chronic midline low back pain without sciatica     Problem List Patient Active Problem List   Diagnosis Date Noted  . Encounter for general adult medical examination with abnormal findings 01/18/2018  . Abnormal liver function 01/18/2018  . S/P laparoscopic cholecystectomy 01/18/2018  . Bradycardia 01/18/2018  . Cutaneous candidiasis 01/18/2018  . Oral candidiasis 01/18/2018  . Septic shock ([place]) 01/08/2018  . Insect bite of right lower leg 09/25/2017  . Atopic dermatitis 09/25/2017  . PAD (peripheral artery disease) ([place]) 11/24/2015  . Type 2 diabetes mellitus with complication, without long-term current use of insulin ([place]) 11/24/2015  . Hyperlipidemia 11/24/2015  . Essential hypertension 11/24/2015  . PVD (peripheral vascular disease) ([place]) 10/29/2015   Shelton Silvas PT, DPT  Shelton Silvas 04/10/2018, 3:11 PM  [facility] [address] [place], [address] Phone: [phone]   Fax:  [phone]  Name: Rami Budhu MRN: [id number] Date of Birth: December 17, 1940

## 2018-04-13 ENCOUNTER — Ambulatory Visit: Payer: Medicare Other | Admitting: Physical Therapy

## 2018-04-17 ENCOUNTER — Encounter: Payer: Self-pay | Admitting: Physical Therapy

## 2018-04-17 ENCOUNTER — Ambulatory Visit: Payer: Medicare Other | Attending: Family Medicine | Admitting: Physical Therapy

## 2018-04-17 DIAGNOSIS — M545 Low back pain: Secondary | ICD-10-CM | POA: Insufficient documentation

## 2018-04-17 DIAGNOSIS — G8929 Other chronic pain: Secondary | ICD-10-CM | POA: Diagnosis not present

## 2018-04-17 NOTE — Therapy (Signed)
[facility] [address], [place], [address] Phone: [phone]   Fax:  [phone]  Physical Therapy Treatment/DC Summary  Patient Details  Name: Allen [name] MRN: [id number] Date of Birth: 1940/08/05 Referring Provider (PT): Meeler   Encounter Date: 04/17/2018  PT End of Session - 04/17/18 1309    Visit Number  12    Number of Visits  17    Date for PT Re-Evaluation  04/19/18    Authorization Type  medicare    Authorization Time Period  3/10    PT Start Time  0100    PT Stop Time  0145    PT Time Calculation (min)  45 min    Equipment Utilized During Treatment  Gait belt    Activity Tolerance  Patient tolerated treatment well    Behavior During Therapy  [facility] for tasks assessed/performed       Past Medical History:  Diagnosis Date  . Coronary artery disease   . Diabetes mellitus without complication ([place])   . Heart disease   . Hypertension   . MI (myocardial infarction) [facility])     Past Surgical History:  Procedure Laterality Date  . CARDIAC CATHETERIZATION  1989  . CARDIAC CATHETERIZATION  1990  . GALLBLADDER SURGERY    . LEG SURGERY Right    stent placement    There were no vitals filed for this visit.    Ther-Ex: -Nustep L65min; L5 18min  - 5x STS 3 trials with cuing for full hip ext/ stand with best time 11sec - DGI with patient able to complete with CGA without AD, reports decreased confidence with AD - Reviewed with patient progress made so far and remaining deficits in some back pain, that most likely LE weakness with LB compensation is contributing too, and decreased patient confidence with balance with walking. Reviewed HEP of the following with physical demo of x10 bilat of the following with min cuing/correction needed for posture. Printout given with parameters with PT educating patient on when to increase/decrease intensity accordingly for strengthening gains; patient verbalizes  understanding.  Supine Bridge - 10 reps - 3 sets - 1x daily - 3x weekly Standing Hip Abduction with Resistance at Ankles and Counter Support - 10 reps - 3 sets - 1x daily - 3x weekly Standing Hip Extension with Resistance at Ankles and Counter Support - 10 reps - 3 sets - 1x daily - 3x weekly Mini Squat with Counter Support - 10 reps - 3 sets - 1x daily - 3x weekly Standing Anti-Rotation Press with Anchored Resistance - 10 reps - 3 sets - 1x daily - 3x weekly                        PT Education - 04/17/18 1306    Education provided  Yes    Education Details  HEP and POC update    Person(s) Educated  Patient    Methods  Explanation;Demonstration;Verbal cues    Comprehension  Verbalized understanding;Returned demonstration;Verbal cues required;Tactile cues required       PT Short Term Goals - 02/23/18 1546      PT SHORT TERM GOAL #1   Title  Pt will be independent with HEP in order to improve strength and decrease back pain in order to improve pain-free function at home and work.     Time  4    Period  Weeks    Status  New  PT Long Term Goals - 04/17/18 1309      PT LONG TERM GOAL #1   Title  Pt will decrease worst back pain as reported on NPRS by at least 2 points in order to demonstrate clinically significant reduction in back pain.     Baseline  04/04/18 3/10    Time  8    Period  Weeks    Status  Achieved      PT LONG TERM GOAL #2   Title  Pt will improve BERG by at least 3 points in order to demonstrate clinically significant improvement in balance    Baseline  04/04/18 44/56    Time  8    Period  Weeks    Status  Achieved      PT LONG TERM GOAL #3   Title  Pt will increase 10MWT by at least 0.13 m/s in order to demonstrate clinically significant improvement in community ambulation.     Baseline  04/04/18 1.0 m/s (02/22/17 .53m/s)    Time  8    Period  Weeks    Status  Achieved      PT LONG TERM GOAL #4   Title  Pt will decrease 5TSTS time  to 10sec without UE support in order to demonstrate age matched norms    Baseline  04/04/18 11sec w/ UE support    Time  8    Period  Weeks    Status  On-going      PT LONG TERM GOAL #5   Title  Patient will demonstrate 10sec or less for TUG test to demonstrate decreased fall risk    Baseline  04/04/18 9.5sec    Time  8    Period  Weeks    Status  Achieved      PT LONG TERM GOAL #6   Title  Pt will improve DGI by at least 3 points in order to demonstrate clinically significant improvement in balance and decreased risk for falls    Baseline  04/17/18 17/24; previously 15/24    Time  8    Period  Weeks    Status  Revised            Plan - 04/17/18 1447    Clinical Impression Statement  PT reassessed goals this session as patient will be DC PT currently d/t needing to take time off to take care of his wife following surgery. Patient is making progress toward goals with some remaining deficits in dynamic balance taks and LE/core strength. Patient is wanting to return to PT following absence. PT educated patient that he will need a new order for return which he understands. Reviewed HEP to maintian gains made thus far which patient is able to demonstrate properly with minor cuing/correction. Patient verbalizes understanding of how/when to increase/decrease intensity as needed. Patient given PT contact info should any questions/concerns arise     Rehab Potential  Fair    Clinical Impairments Affecting Rehab Potential  Negative: Chronic condition, co-morbidities. Positive: motivated to improve, good results from previous PT.     PT Frequency  2x / week    PT Duration  8 weeks    PT Treatment/Interventions  ADLs/Self Care Home Management;Cryotherapy;Electrical Stimulation;Moist Heat;Traction;Ultrasound;DME Instruction;Gait training;Stair training;Functional mobility training;Therapeutic activities;Therapeutic exercise;Balance training;Neuromuscular re-education;Patient/family education;Manual  techniques;Passive range of motion;Dry needling;Energy conservation    PT Next Visit Plan  core and hip strengthening    PT Home Exercise Plan  bridge, hamstring stretch, lumbar ext    Consulted  and Agree with Plan of Care  Patient       Patient will benefit from skilled therapeutic intervention in order to improve the following deficits and impairments:  Abnormal gait, Decreased activity tolerance, Decreased balance, Decreased coordination, Decreased endurance, Decreased mobility, Decreased knowledge of use of DME, Decreased range of motion, Decreased strength, Difficulty walking, Hypomobility, Increased fascial restricitons, Increased muscle spasms, Impaired perceived functional ability, Impaired flexibility, Impaired sensation, Improper body mechanics, Postural dysfunction, Obesity, Pain  Visit Diagnosis: Chronic midline low back pain without sciatica     Problem List Patient Active Problem List   Diagnosis Date Noted  . Encounter for general adult medical examination with abnormal findings 01/18/2018  . Abnormal liver function 01/18/2018  . S/P laparoscopic cholecystectomy 01/18/2018  . Bradycardia 01/18/2018  . Cutaneous candidiasis 01/18/2018  . Oral candidiasis 01/18/2018  . Septic shock ([place]) 01/08/2018  . Insect bite of right lower leg 09/25/2017  . Atopic dermatitis 09/25/2017  . PAD (peripheral artery disease) ([place]) 11/24/2015  . Type 2 diabetes mellitus with complication, without long-term current use of insulin ([place]) 11/24/2015  . Hyperlipidemia 11/24/2015  . Essential hypertension 11/24/2015  . PVD (peripheral vascular disease) ([place]) 10/29/2015   Shelton Silvas PT, DPT Shelton Silvas 04/17/2018, 3:05 PM  [facility] [address], [place], [address] Phone: [phone]   Fax:  [phone]  Name: Pranay Hilbun MRN: [id number] Date of Birth: 1940/08/10

## 2018-04-20 ENCOUNTER — Ambulatory Visit: Payer: Medicare Other | Admitting: Physical Therapy

## 2018-04-24 ENCOUNTER — Ambulatory Visit: Payer: Medicare Other | Admitting: Physical Therapy

## 2018-04-27 ENCOUNTER — Ambulatory Visit: Payer: Medicare Other | Admitting: Physical Therapy

## 2018-04-27 DIAGNOSIS — M5416 Radiculopathy, lumbar region: Secondary | ICD-10-CM | POA: Diagnosis not present

## 2018-04-27 DIAGNOSIS — M9903 Segmental and somatic dysfunction of lumbar region: Secondary | ICD-10-CM | POA: Diagnosis not present

## 2018-04-27 DIAGNOSIS — M9905 Segmental and somatic dysfunction of pelvic region: Secondary | ICD-10-CM | POA: Diagnosis not present

## 2018-04-27 DIAGNOSIS — M5136 Other intervertebral disc degeneration, lumbar region: Secondary | ICD-10-CM | POA: Diagnosis not present

## 2018-05-01 ENCOUNTER — Encounter (INDEPENDENT_AMBULATORY_CARE_PROVIDER_SITE_OTHER): Payer: Medicare Other

## 2018-05-01 ENCOUNTER — Ambulatory Visit (INDEPENDENT_AMBULATORY_CARE_PROVIDER_SITE_OTHER): Payer: Medicare Other | Admitting: Vascular Surgery

## 2018-05-18 ENCOUNTER — Other Ambulatory Visit: Payer: Self-pay

## 2018-05-18 MED ORDER — ENALAPRIL MALEATE 20 MG PO TABS: 20.0000 mg | ORAL_TABLET | Freq: Every day | ORAL | 0 refills | Status: DC

## 2018-05-18 NOTE — Telephone Encounter (Signed)
Pt advised keep follow up appt

## 2018-05-22 ENCOUNTER — Other Ambulatory Visit: Payer: Self-pay

## 2018-05-22 ENCOUNTER — Ambulatory Visit (INDEPENDENT_AMBULATORY_CARE_PROVIDER_SITE_OTHER): Payer: Medicare Other | Admitting: Vascular Surgery

## 2018-05-22 ENCOUNTER — Encounter (INDEPENDENT_AMBULATORY_CARE_PROVIDER_SITE_OTHER): Payer: Medicare Other

## 2018-05-22 MED ORDER — ENALAPRIL MALEATE 20 MG PO TABS: 20.0000 mg | ORAL_TABLET | Freq: Every day | ORAL | 0 refills | Status: DC

## 2018-06-19 ENCOUNTER — Ambulatory Visit: Payer: Medicare Other | Admitting: Physical Therapy

## 2018-06-21 ENCOUNTER — Encounter: Payer: Medicare Other | Admitting: Physical Therapy

## 2018-06-26 ENCOUNTER — Encounter: Payer: Medicare Other | Admitting: Physical Therapy

## 2018-06-28 ENCOUNTER — Encounter: Payer: Medicare Other | Admitting: Physical Therapy

## 2018-06-28 DIAGNOSIS — E039 Hypothyroidism, unspecified: Secondary | ICD-10-CM | POA: Diagnosis not present

## 2018-06-28 DIAGNOSIS — E785 Hyperlipidemia, unspecified: Secondary | ICD-10-CM | POA: Diagnosis not present

## 2018-06-28 DIAGNOSIS — N183 Chronic kidney disease, stage 3 (moderate): Secondary | ICD-10-CM | POA: Diagnosis not present

## 2018-06-28 DIAGNOSIS — E1122 Type 2 diabetes mellitus with diabetic chronic kidney disease: Secondary | ICD-10-CM | POA: Diagnosis not present

## 2018-06-28 DIAGNOSIS — I10 Essential (primary) hypertension: Secondary | ICD-10-CM | POA: Diagnosis not present

## 2018-06-28 DIAGNOSIS — Z992 Dependence on renal dialysis: Secondary | ICD-10-CM | POA: Diagnosis not present

## 2018-06-28 DIAGNOSIS — N186 End stage renal disease: Secondary | ICD-10-CM | POA: Diagnosis not present

## 2018-06-28 DIAGNOSIS — E1169 Type 2 diabetes mellitus with other specified complication: Secondary | ICD-10-CM | POA: Diagnosis not present

## 2018-06-28 DIAGNOSIS — E669 Obesity, unspecified: Secondary | ICD-10-CM | POA: Diagnosis not present

## 2018-06-28 DIAGNOSIS — Z794 Long term (current) use of insulin: Secondary | ICD-10-CM | POA: Diagnosis not present

## 2018-06-29 ENCOUNTER — Other Ambulatory Visit: Payer: Self-pay

## 2018-06-29 ENCOUNTER — Encounter: Payer: Medicare Other | Admitting: Physical Therapy

## 2018-06-29 DIAGNOSIS — L209 Atopic dermatitis, unspecified: Secondary | ICD-10-CM

## 2018-06-29 MED ORDER — TRIAMCINOLONE ACETONIDE 0.1 % EX CREA: 1.0000 "application " | TOPICAL_CREAM | Freq: Two times a day (BID) | CUTANEOUS | 3 refills | Status: DC

## 2018-07-03 ENCOUNTER — Encounter: Payer: Medicare Other | Admitting: Physical Therapy

## 2018-07-05 ENCOUNTER — Encounter: Payer: Medicare Other | Admitting: Physical Therapy

## 2018-07-11 ENCOUNTER — Encounter: Payer: Medicare Other | Admitting: Physical Therapy

## 2018-07-13 ENCOUNTER — Encounter: Payer: Medicare Other | Admitting: Physical Therapy

## 2018-07-18 ENCOUNTER — Ambulatory Visit: Payer: Self-pay | Admitting: Nurse Practitioner

## 2018-07-27 ENCOUNTER — Ambulatory Visit (INDEPENDENT_AMBULATORY_CARE_PROVIDER_SITE_OTHER): Payer: Medicare Other | Admitting: Vascular Surgery

## 2018-07-27 ENCOUNTER — Encounter (INDEPENDENT_AMBULATORY_CARE_PROVIDER_SITE_OTHER): Payer: Medicare Other

## 2018-08-21 ENCOUNTER — Other Ambulatory Visit: Payer: Self-pay | Admitting: Nurse Practitioner

## 2018-08-21 MED ORDER — ENALAPRIL MALEATE 20 MG PO TABS: 20.0000 mg | ORAL_TABLET | Freq: Every day | ORAL | 0 refills | Status: DC

## 2018-09-25 ENCOUNTER — Other Ambulatory Visit: Payer: Self-pay | Admitting: Nurse Practitioner

## 2018-09-25 MED ORDER — ENALAPRIL MALEATE 20 MG PO TABS: 20.0000 mg | ORAL_TABLET | Freq: Every day | ORAL | 0 refills | Status: DC

## 2018-10-17 ENCOUNTER — Other Ambulatory Visit: Payer: Self-pay

## 2018-10-17 DIAGNOSIS — L209 Atopic dermatitis, unspecified: Secondary | ICD-10-CM

## 2018-10-17 MED ORDER — TRIAMCINOLONE ACETONIDE 0.1 % EX CREA: 1.0000 "application " | TOPICAL_CREAM | Freq: Two times a day (BID) | CUTANEOUS | 3 refills | Status: DC

## 2018-10-24 ENCOUNTER — Other Ambulatory Visit: Payer: Self-pay | Admitting: Nurse Practitioner

## 2018-10-24 MED ORDER — ENALAPRIL MALEATE 20 MG PO TABS: 20.0000 mg | ORAL_TABLET | Freq: Every day | ORAL | 2 refills | Status: DC

## 2018-11-15 ENCOUNTER — Other Ambulatory Visit: Payer: Self-pay

## 2018-11-15 ENCOUNTER — Ambulatory Visit (INDEPENDENT_AMBULATORY_CARE_PROVIDER_SITE_OTHER): Payer: Medicare Other

## 2018-11-15 DIAGNOSIS — G4733 Obstructive sleep apnea (adult) (pediatric): Secondary | ICD-10-CM

## 2018-11-15 NOTE — Progress Notes (Signed)
95 percentile pressure 11   95th percentile leak 40.3   apnea index 0.6 /hr  apnea-hypopnea index  1.6 /hr   total days used  >4 hr 90 days  total days used <4 hr 0 days  Total compliance 100 percent  No problems or questions. He is getting the message motor lifeexceeded expectation. He is making appt with dr. Humphrey Rolls or Quita Skye about getting new cpap

## 2018-11-27 ENCOUNTER — Encounter: Payer: Self-pay | Admitting: Internal Medicine

## 2018-11-27 ENCOUNTER — Other Ambulatory Visit: Payer: Self-pay

## 2018-11-27 ENCOUNTER — Ambulatory Visit (INDEPENDENT_AMBULATORY_CARE_PROVIDER_SITE_OTHER): Payer: Medicare Other | Admitting: Internal Medicine

## 2018-11-27 VITALS — BP 130/70 | HR 78 | Temp 97.2°F | Resp 16 | Ht 68.0 in | Wt 218.0 lb

## 2018-11-27 DIAGNOSIS — I10 Essential (primary) hypertension: Secondary | ICD-10-CM

## 2018-11-27 DIAGNOSIS — G4733 Obstructive sleep apnea (adult) (pediatric): Secondary | ICD-10-CM

## 2018-11-27 DIAGNOSIS — I2510 Atherosclerotic heart disease of native coronary artery without angina pectoris: Secondary | ICD-10-CM

## 2018-11-27 DIAGNOSIS — Z9989 Dependence on other enabling machines and devices: Secondary | ICD-10-CM | POA: Diagnosis not present

## 2018-11-27 NOTE — Progress Notes (Signed)
[facility] [place], [place] [address]  Pulmonary Sleep Medicine   Office Visit Note  Patient Name: Allen [name] DOB: 1940/07/24 MRN [id number]  Date of Service: 11/27/2018  Complaints/HPI: PT is here for follow up.  He reports his current cpap is 26-76 years old.  It is giving him a message that says "the motor needs to be changed.' He reports he can not sleep without the machine, and he is worried that it will stop working.   ROS  General: (-) fever, (-) chills, (-) night sweats, (-) weakness Skin: (-) rashes, (-) itching,. Eyes: (-) visual changes, (-) redness, (-) itching. Nose and Sinuses: (-) nasal stuffiness or itchiness, (-) postnasal drip, (-) nosebleeds, (-) sinus trouble. Mouth and Throat: (-) sore throat, (-) hoarseness. Neck: (-) swollen glands, (-) enlarged thyroid, (-) neck pain. Respiratory: - cough, (-) bloody sputum, - shortness of breath, - wheezing. Cardiovascular: - ankle swelling, (-) chest pain. Lymphatic: (-) lymph node enlargement. Neurologic: (-) numbness, (-) tingling. Psychiatric: (-) anxiety, (-) depression   Current Medication: Outpatient Encounter Medications as of 11/27/2018  Medication Sig Note  . aspirin 81 MG chewable tablet Chew by mouth daily.   Marland Kitchen b complex vitamins tablet Take 1 tablet by mouth daily.   . B-D ULTRA-FINE 33 LANCETS MISC Use 1 Lancet 3 (three) times daily. 11/24/2015: Received from: [facility]  . cetirizine (ZYRTEC) 10 MG tablet Take 10 mg by mouth daily.   . Cholecalciferol (VITAMIN D3) 1000 units CAPS Take by mouth.   . Continuous Blood Gluc Sensor (FREESTYLE LIBRE SENSOR SYSTEM) MISC Use 3 each every 10 (ten) days.   . enalapril (VASOTEC) 20 MG tablet Take 1 tablet (20 mg total) by mouth daily.   Marland Kitchen ezetimibe-simvastatin (VYTORIN) 10-40 MG tablet Take 1 tablet by mouth daily.   Marland Kitchen FLUZONE HIGH-DOSE 0.5 ML injection ADM 0.5ML IM UTD   . folic acid (FOLVITE) A999333 MCG tablet Take 400  mcg by mouth daily.   . Garlic 123XX123 MG CAPS Take by mouth.   Marland Kitchen glucose blood (ONE TOUCH ULTRA TEST) test strip Check blood sugar 5x a day as directed   . hydrochlorothiazide (MICROZIDE) 12.5 MG capsule Take 12.5 mg by mouth daily.   . insulin lispro (HUMALOG) 100 UNIT/ML injection Inject 6-34 Units into the skin 3 (three) times daily before meals.    . Insulin Pen Needle (BD PEN NEEDLE NANO U/F) 32G X 4 MM MISC USE AS DIRECTED FOUR TIMES A DAY WITH LEVEMIR FLEXTOUCH AND HUMALOG PEN.  DX CODE E11.29   . Insulin Syringe-Needle U-100 (BD INSULIN SYRINGE U/F) 31G X 5/16" 0.3 ML MISC Inject insulin [place] TID as indicated per sliding scale instuctions.   Marland Kitchen JARDIANCE 25 MG TABS tablet Take 0.5 mg by mouth daily.    Marland Kitchen levothyroxine (SYNTHROID, LEVOTHROID) 50 MCG tablet Take 50 mcg by mouth daily before breakfast.   . metFORMIN (GLUCOPHAGE) 1000 MG tablet Take 1,000 mg by mouth daily with breakfast.    . metFORMIN (GLUCOPHAGE) 500 MG tablet Take 1,000 mg by mouth daily with breakfast.   . Multiple Vitamin (MULTIVITAMIN WITH MINERALS) TABS tablet Take 1 tablet by mouth daily.   . mupirocin ointment (BACTROBAN) 2 % Apply topically two times daily   . nystatin ointment (MYCOSTATIN) Apply 1 application topically 3 (three) times daily. (Patient taking differently: Apply 1 application topically as needed. )   . pioglitazone (ACTOS) 30 MG tablet Take 30 mg by mouth daily.    Marland Kitchen  traMADol (ULTRAM) 50 MG tablet Take 100 mg by mouth daily.  11/24/2015: Received from: [facility]: 1-2 po bid prn  . triamcinolone cream (KENALOG) 0.1 % Apply 1 application topically 2 (two) times daily.   . vitamin B-12 (CYANOCOBALAMIN) 1000 MCG tablet Take 1,000 mcg by mouth daily.   . metoprolol succinate (TOPROL-XL) 25 MG 24 hr tablet Take 1 tablet (25 mg total) by mouth daily.    No facility-administered encounter medications on file as of 11/27/2018.     Surgical History: Past Surgical History:   Procedure Laterality Date  . CARDIAC CATHETERIZATION  1989  . CARDIAC CATHETERIZATION  1990  . GALLBLADDER SURGERY    . LEG SURGERY Right    stent placement    Medical History: Past Medical History:  Diagnosis Date  . Coronary artery disease   . Diabetes mellitus without complication ([place])   . Heart disease   . Hypertension   . MI (myocardial infarction) ([place])   . Sleep apnea     Family History: Family History  Problem Relation Age of Onset  . Diabetes Mother   . Brain cancer Mother   . Throat cancer Father   . Prostate cancer Brother     Social History: Social History   Socioeconomic History  . Marital status: Married    Spouse name: Not on file  . Number of children: Not on file  . Years of education: Not on file  . Highest education level: Not on file  Occupational History  . Not on file  Social Needs  . Financial resource strain: Not on file  . Food insecurity    Worry: Not on file    Inability: Not on file  . Transportation needs    Medical: Not on file    Non-medical: Not on file  Tobacco Use  . Smoking status: Former Smoker    Packs/day: 3.00    Years: 32.00    Pack years: 96.00    Types: Cigarettes  . Smokeless tobacco: Never Used  Substance and Sexual Activity  . Alcohol use: No    Frequency: Never  . Drug use: No  . Sexual activity: Not on file  Lifestyle  . Physical activity    Days per week: Not on file    Minutes per session: Not on file  . Stress: Not on file  Relationships  . Social Herbalist on phone: Not on file    Gets together: Not on file    Attends religious service: Not on file    Active member of club or organization: Not on file    Attends meetings of clubs or organizations: Not on file    Relationship status: Not on file  . Intimate partner violence    Fear of current or ex partner: Not on file    Emotionally abused: Not on file    Physically abused: Not on file    Forced sexual activity: Not on file   Other Topics Concern  . Not on file  Social History Narrative  . Not on file    Vital Signs: Blood pressure 130/70, pulse 78, temperature (!) 97.2 F (36.2 C), resp. rate 16, height 5\' 8"  (1.727 m), weight 218 lb (98.9 kg), SpO2 97 %.  Examination: General Appearance: The patient is well-developed, well-nourished, and in no distress. Skin: Gross inspection of skin unremarkable. Head: normocephalic, no gross deformities. Eyes: no gross deformities noted. ENT: ears appear grossly normal no exudates.  Neck: Supple. No thyromegaly. No LAD. Respiratory: clear bilaterally. Cardiovascular: Normal S1 and S2 without murmur or rub. Extremities: No cyanosis. pulses are equal. Neurologic: Alert and oriented. No involuntary movements.  LABS: No results found for this or any previous visit (from the past 2160 hour(s)).  Radiology: Dg Chest 2 View  Result Date: 01/08/2018 CLINICAL DATA:  Fever yesterday. Vomiting today. Dizziness and chest pain. EXAM: CHEST - 2 VIEW COMPARISON:  07/06/2015 FINDINGS: Normal heart size and pulmonary vascularity. No focal airspace disease or consolidation in the lungs. No blunting of costophrenic angles. No pneumothorax. Mediastinal contours appear intact. Calcification of the aorta. Degenerative changes in the spine and shoulders. IMPRESSION: No active cardiopulmonary disease. Electronically Signed   By: Lucienne Capers M.D.   On: 01/08/2018 01:35   Ct Angio Chest/abd/pel For Dissection W And/or W/wo  Result Date: 01/08/2018 CLINICAL DATA:  Chest pain, upper abdominal pain with nausea and vomiting. Fever. EXAM: CT ANGIOGRAPHY CHEST, ABDOMEN AND PELVIS TECHNIQUE: Multidetector CT imaging through the chest, abdomen and pelvis was performed using the standard protocol during bolus administration of intravenous contrast. Multiplanar reconstructed images and MIPs were obtained and reviewed to evaluate the vascular anatomy. CONTRAST:  142mL ISOVUE-370 IOPAMIDOL  (ISOVUE-370) INJECTION 76% COMPARISON:  None. FINDINGS: CTA CHEST FINDINGS Cardiovascular: No thoracic aortic aneurysm or thoracic aortic dissection. Scattered aortic atherosclerosis. Mild cardiomegaly. Three-vessel coronary artery calcifications. No pericardial effusion. No pulmonary embolism identified. Mediastinum/Nodes: No mass or enlarged lymph nodes seen within the mediastinum or perihilar regions. Esophagus appears normal. Trachea and central bronchi are unremarkable. Lungs/Pleura: Lungs are clear. No pleural effusion or pneumothorax. Musculoskeletal: No acute or suspicious osseous finding. Degenerative spondylosis throughout the thoracic spine, mild to moderate in degree. Review of the MIP images confirms the above findings. CTA ABDOMEN AND PELVIS FINDINGS VASCULAR Aorta: Normal caliber aorta without aneurysm, dissection, vasculitis or significant stenosis. Fairly extensive aortic atherosclerosis. Celiac: Celiac artery trunk is patent without evidence of aneurysm, dissection, vasculitis or significant stenosis. Normal contrast flow is seen to the RIGHT and LEFT hepatic arteries, although perhaps slightly diminished to the LEFT liver lobe. Normal contrast flow is seen to the splenic artery. SMA: Patent without evidence of aneurysm, dissection, vasculitis or significant stenosis. Renals: Both renal arteries are patent without evidence of aneurysm, dissection, vasculitis, fibromuscular dysplasia or significant stenosis. IMA: Patent without evidence of aneurysm, dissection, vasculitis or significant stenosis. Inflow: Patent without evidence of aneurysm, dissection, vasculitis or significant stenosis. Veins: No obvious venous abnormality within the limitations of this arterial phase study. However, the main portal vein is prominent, measuring 1.9 cm diameter. Review of the MIP images confirms the above findings. NON-VASCULAR Hepatobiliary: Relatively diminished contrast enhancement of the LEFT liver lobe,  compared to the RIGHT hepatic lobe. No focal mass or lesion within either liver lobe. Gallbladder walls appear thickened and there is at least mild pericholecystic edema suggesting acute cholecystitis. Common bile duct dilatation. 3 mm calcific density at the level of the distal common bile duct, highly suspicious for obstructing CBD stone. Pancreas: Unremarkable. No pancreatic ductal dilatation or surrounding inflammatory changes. Spleen: Normal in size without focal abnormality. Adrenals/Urinary Tract: Adrenal glands appear normal. Kidneys are unremarkable without mass, stone or hydronephrosis. Bladder is unremarkable, partially decompressed. Stomach/Bowel: No dilated large or small bowel loops. No evidence of bowel wall inflammation. Appendix is normal. Mild diverticulosis of the sigmoid and descending colon without evidence of acute diverticulitis. Stomach is unremarkable, partially decompressed. Lymphatic: No enlarged lymph nodes seen in the abdomen or pelvis. Partially  calcified structures within the upper abdomen are suspected to represent partially calcified lymph nodes indicating previous granulomatous infection, or chronic partially calcified blood vessel collateral/varices. Reproductive: Prostate is unremarkable. Other: No abscess collection. No free intraperitoneal air. Musculoskeletal: No acute or suspicious osseous finding. Degenerative changes within the mid and lower lumbar spine, moderate in degree. Review of the MIP images confirms the above findings. IMPRESSION: 1. Common bile duct dilatation, measuring approximately 10 mm diameter. 3 mm calcific density at the level of the distal common bile duct, compatible with obstructing CBD stone. 2. Gallbladder walls appear thickened and there is at least mild pericholecystic edema suggesting an associated acute cholecystitis and/or cholangitis. 3. Relatively diminished contrast enhancement of the LEFT liver lobe, compared to the normal enhancement of the  RIGHT hepatic lobe, likely delayed enhancement as there is normal contrast opacification of the LEFT hepatic artery branches, possibly related to shunting given the aforementioned cholecystitis/cholangitis. Recommend Doppler ultrasound of the portal vein to exclude the less likely possibility of portal vein thrombosis as a cause for this asymmetrically diminished enhancement. 4. Mild colonic diverticulosis without evidence of acute diverticulitis. 5. Coronary artery calcifications, particularly dense within the LEFT main and proximal LEFT anterior descending coronary arteries. Recommend correlation with any possible associated cardiac symptoms. 6. Mild cardiomegaly. Aortic Atherosclerosis (ICD10-I70.0). These results, considerations and recommendations were called by telephone at the time of interpretation on 01/08/2018 at 2:45 am to Dr. Darel Hong , who verbally acknowledged these results. Electronically Signed   By: Franki Cabot M.D.   On: 01/08/2018 02:52   US Abdomen Limited Ruq  Result Date: 01/08/2018 CLINICAL DATA:  Right upper quadrant pain. EXAM: ULTRASOUND ABDOMEN LIMITED RIGHT UPPER QUADRANT COMPARISON:  None. FINDINGS: Gallbladder: Mildly thickened gallbladder wall with focal pericholecystic fluid and layering sludge. No discrete stones identified. Murphy's sign is negative. Common bile duct: Diameter: 8.8 mm, mildly dilated.  No filling defects identified. Liver: No focal lesion identified. Within normal limits in parenchymal echogenicity. Portal vein is patent on color Doppler imaging with normal direction of blood flow towards the liver. IMPRESSION: Gallbladder sludge, focal pericholecystic edema, and mild gallbladder wall thickening. Murphy's sign negative. Possibly chronic inflammation or stasis. Mild extrahepatic bile duct dilatation. No cause identified. Correlate with liver function studies. Electronically Signed   By: Lucienne Capers M.D.   On: 01/08/2018 03:29    No results  found.  No results found.    Assessment and Plan: Patient Active Problem List   Diagnosis Date Noted  . Encounter for general adult medical examination with abnormal findings 01/18/2018  . Abnormal liver function 01/18/2018  . S/P laparoscopic cholecystectomy 01/18/2018  . Bradycardia 01/18/2018  . Cutaneous candidiasis 01/18/2018  . Oral candidiasis 01/18/2018  . Septic shock ([place]) 01/08/2018  . Insect bite of right lower leg 09/25/2017  . Atopic dermatitis 09/25/2017  . PAD (peripheral artery disease) ([place]) 11/24/2015  . Type 2 diabetes mellitus with complication, without long-term current use of insulin ([place]) 11/24/2015  . Hyperlipidemia 11/24/2015  . Essential hypertension 11/24/2015  . PVD (peripheral vascular disease) ([place]) 10/29/2015   1. OSA on CPAP Will get titration study to evaluate appropriate pressure for new machine.   2. Essential hypertension Stable, continue present management.  3. Morbid obesity ([place]) Obesity Counseling: Risk Assessment: An assessment of behavioral risk factors was made today and includes lack of exercise sedentary lifestyle, lack of portion control and poor dietary habits.  Risk Modification Advice: She was counseled on portion control guidelines. Restricting daily caloric intake  to. . The detrimental long term effects of obesity on her health and ongoing poor compliance was also discussed with the patient.     General Counseling: I have discussed the findings of the evaluation and examination with Effie Shy.  I have also discussed any further diagnostic evaluation thatmay be needed or ordered today. Tae verbalizes understanding of the findings of todays visit. We also reviewed his medications today and discussed drug interactions and side effects including but not limited excessive drowsiness and altered mental states. We also discussed that there is always a risk not just to him but also people around him. he has been encouraged to call the  office with any questions or concerns that should arise related to todays visit.    Time spent: 15 This patient was seen by Orson Gear AGNP-C in Collaboration with Dr. Devona Konig as a part of collaborative care agreement.   I have personally obtained a history, examined the patient, evaluated laboratory and imaging results, formulated the assessment and plan and placed orders.    Allyne Gee, MD [facility] Pulmonary and Critical Care Sleep medicine

## 2018-11-28 ENCOUNTER — Other Ambulatory Visit: Payer: Self-pay

## 2018-11-28 MED ORDER — EZETIMIBE-SIMVASTATIN 10-40 MG PO TABS: 1.0000 | ORAL_TABLET | Freq: Every day | ORAL | 2 refills | Status: DC

## 2018-11-29 DIAGNOSIS — Z23 Encounter for immunization: Secondary | ICD-10-CM | POA: Diagnosis not present

## 2018-12-13 ENCOUNTER — Encounter: Payer: Medicare Other | Admitting: Internal Medicine

## 2018-12-20 ENCOUNTER — Encounter: Payer: Medicare Other | Admitting: Internal Medicine

## 2018-12-21 ENCOUNTER — Ambulatory Visit: Payer: Medicare Other | Admitting: Internal Medicine

## 2018-12-26 ENCOUNTER — Other Ambulatory Visit: Payer: Self-pay

## 2018-12-26 ENCOUNTER — Ambulatory Visit (INDEPENDENT_AMBULATORY_CARE_PROVIDER_SITE_OTHER): Payer: Medicare Other | Admitting: Nurse Practitioner

## 2018-12-26 ENCOUNTER — Encounter: Payer: Self-pay | Admitting: Nurse Practitioner

## 2018-12-26 VITALS — BP 137/60 | HR 58 | Temp 97.2°F | Resp 16 | Ht 68.0 in | Wt 223.0 lb

## 2018-12-26 DIAGNOSIS — E1165 Type 2 diabetes mellitus with hyperglycemia: Secondary | ICD-10-CM

## 2018-12-26 DIAGNOSIS — B372 Candidiasis of skin and nail: Secondary | ICD-10-CM | POA: Diagnosis not present

## 2018-12-26 DIAGNOSIS — I10 Essential (primary) hypertension: Secondary | ICD-10-CM

## 2018-12-26 MED ORDER — CLOTRIMAZOLE-BETAMETHASONE 1-0.05 % EX CREA: 1.0000 "application " | TOPICAL_CREAM | Freq: Two times a day (BID) | CUTANEOUS | 2 refills | Status: DC

## 2018-12-26 NOTE — Progress Notes (Signed)
[facility] [place], [place] [address]  Internal MEDICINE  Office Visit Note  Patient Name: Allen [name]  [id number]  [id number]  Date of Service: 01/12/2019  Chief Complaint  Patient presents with  . Diabetes  . Hypertension  . Quality Metric Gaps    eye exam    The patient is here for routine follow up. He has type 2 diabetes. Blood sugars are doing well. HgbA1c is 6.7 today. He does see endocrinology for diabetes, however, has been over six months since he has seen them. Has had some trouble getting refills from that office since initial endocrinologist retired. The patient states that he would like to have 90 day prescription for all routine medications if possible. He continues to have fungal type rash in both groin areas. Has been using ketoconazole cream. States that it gets rid of the rash and then two or three days later, it comes right back. States that he has changed to cotton underwear only. Has gone without underwear at all. That helps also, but does not feel comfortable like that all the time. He has also continued to use the ketoconazole cream for two or three days after the rash clears, and it will still come back after he stops using it.       Current Medication: Outpatient Encounter Medications as of 12/26/2018  Medication Sig Note  . aspirin 81 MG chewable tablet Chew by mouth daily.   Marland Kitchen b complex vitamins tablet Take 1 tablet by mouth daily.   . B-D ULTRA-FINE 33 LANCETS MISC Use 1 Lancet 3 (three) times daily. 11/24/2015: Received from: [place]  . cetirizine (ZYRTEC) 10 MG tablet Take 10 mg by mouth daily.   . Cholecalciferol (VITAMIN D3) 1000 units CAPS Take by mouth.   . Continuous Blood Gluc Sensor (FREESTYLE LIBRE SENSOR SYSTEM) MISC Use 3 each every 10 (ten) days.   Marland Kitchen ezetimibe-simvastatin (VYTORIN) 10-40 MG tablet Take 1 tablet by mouth daily.   Marland Kitchen FLUZONE HIGH-DOSE 0.5 ML injection ADM 0.5ML IM UTD   . folic  acid (FOLVITE) A999333 MCG tablet Take 400 mcg by mouth daily.   . Garlic 123XX123 MG CAPS Take by mouth.   Marland Kitchen glucose blood (ONE TOUCH ULTRA TEST) test strip Check blood sugar 5x a day as directed   . hydrochlorothiazide (MICROZIDE) 12.5 MG capsule Take 12.5 mg by mouth daily.   . insulin lispro (HUMALOG) 100 UNIT/ML injection Inject 6-34 Units into the skin 3 (three) times daily before meals.    . Insulin Pen Needle (BD PEN NEEDLE NANO U/F) 32G X 4 MM MISC USE AS DIRECTED FOUR TIMES A DAY WITH LEVEMIR FLEXTOUCH AND HUMALOG PEN.  DX CODE E11.29   . Insulin Syringe-Needle U-100 (BD INSULIN SYRINGE U/F) 31G X 5/16" 0.3 ML MISC Inject insulin [place] TID as indicated per sliding scale instuctions.   Marland Kitchen JARDIANCE 25 MG TABS tablet Take 0.5 mg by mouth daily.    Marland Kitchen levothyroxine (SYNTHROID, LEVOTHROID) 50 MCG tablet Take 50 mcg by mouth daily before breakfast.   . metFORMIN (GLUCOPHAGE) 1000 MG tablet Take 1,000 mg by mouth daily with breakfast.    . metFORMIN (GLUCOPHAGE) 500 MG tablet Take 1,000 mg by mouth daily with breakfast.   . Multiple Vitamin (MULTIVITAMIN WITH MINERALS) TABS tablet Take 1 tablet by mouth daily.   . mupirocin ointment (BACTROBAN) 2 % Apply topically two times daily   . nystatin ointment (MYCOSTATIN) Apply 1 application topically 3 (three) times  daily. (Patient taking differently: Apply 1 application topically as needed. )   . pioglitazone (ACTOS) 30 MG tablet Take 30 mg by mouth daily.    . traMADol (ULTRAM) 50 MG tablet Take 100 mg by mouth daily.  11/24/2015: Received from: [place]: 1-2 po bid prn  . triamcinolone cream (KENALOG) 0.1 % Apply 1 application topically 2 (two) times daily.   . vitamin B-12 (CYANOCOBALAMIN) 1000 MCG tablet Take 1,000 mcg by mouth daily.   . [DISCONTINUED] enalapril (VASOTEC) 20 MG tablet Take 1 tablet (20 mg total) by mouth daily.   . clotrimazole-betamethasone (LOTRISONE) cream Apply 1 application topically 2 (two) times  daily.   . [DISCONTINUED] metoprolol succinate (TOPROL-XL) 25 MG 24 hr tablet Take 1 tablet (25 mg total) by mouth daily.    No facility-administered encounter medications on file as of 12/26/2018.     Surgical History: Past Surgical History:  Procedure Laterality Date  . CARDIAC CATHETERIZATION  1989  . CARDIAC CATHETERIZATION  1990  . GALLBLADDER SURGERY    . LEG SURGERY Right    stent placement    Medical History: Past Medical History:  Diagnosis Date  . Coronary artery disease   . Diabetes mellitus without complication ([place])   . Heart disease   . Hypertension   . MI (myocardial infarction) ([place])   . Sleep apnea     Family History: Family History  Problem Relation Age of Onset  . Diabetes Mother   . Brain cancer Mother   . Throat cancer Father   . Prostate cancer Brother     Social History   Socioeconomic History  . Marital status: Married    Spouse name: Not on file  . Number of children: Not on file  . Years of education: Not on file  . Highest education level: Not on file  Occupational History  . Not on file  Social Needs  . Financial resource strain: Not on file  . Food insecurity    Worry: Not on file    Inability: Not on file  . Transportation needs    Medical: Not on file    Non-medical: Not on file  Tobacco Use  . Smoking status: Former Smoker    Packs/day: 3.00    Years: 32.00    Pack years: 96.00    Types: Cigarettes  . Smokeless tobacco: Never Used  Substance and Sexual Activity  . Alcohol use: No    Frequency: Never  . Drug use: No  . Sexual activity: Not on file  Lifestyle  . Physical activity    Days per week: Not on file    Minutes per session: Not on file  . Stress: Not on file  Relationships  . Social Herbalist on phone: Not on file    Gets together: Not on file    Attends religious service: Not on file    Active member of club or organization: Not on file    Attends meetings of clubs or organizations: Not on  file    Relationship status: Not on file  . Intimate partner violence    Fear of current or ex partner: Not on file    Emotionally abused: Not on file    Physically abused: Not on file    Forced sexual activity: Not on file  Other Topics Concern  . Not on file  Social History Narrative  . Not on file      Review of Systems  Constitutional: Negative for activity change, chills, fatigue and unexpected weight change.  HENT: Negative for congestion, postnasal drip, rhinorrhea, sneezing and sore throat.   Respiratory: Negative for cough, chest tightness and shortness of breath.   Cardiovascular: Negative for chest pain and palpitations.  Gastrointestinal: Negative for abdominal pain, constipation, diarrhea, nausea and vomiting.  Endocrine: Negative for cold intolerance, heat intolerance, polydipsia and polyuria.       Blood sugars doing well    Musculoskeletal: Negative for arthralgias, back pain, joint swelling and neck pain.  Skin: Positive for rash.       Skin rash in both groin areas. Itchy and irritated. Comes and goes. Is better if he wears no underwear, hwever, he does not feel comfortable like that often.   Allergic/Immunologic: Negative for environmental allergies.  Neurological: Negative for dizziness, tremors, numbness and headaches.  Hematological: Negative for adenopathy. Does not bruise/bleed easily.  Psychiatric/Behavioral: Negative for behavioral problems (Depression), sleep disturbance and suicidal ideas. The patient is not nervous/anxious.     Today's Vitals   12/26/18 1405  BP: 137/60  Pulse: (!) 58  Resp: 16  Temp: (!) 97.2 F (36.2 C)  SpO2: 98%  Weight: 223 lb (101.2 kg)  Height: 5\' 8"  (1.727 m)   Body mass index is 33.91 kg/m.  Physical Exam Vitals signs and nursing note reviewed.  Constitutional:      General: He is not in acute distress.    Appearance: Normal appearance. He is well-developed. He is not diaphoretic.  HENT:     Head: Normocephalic  and atraumatic.     Mouth/Throat:     Pharynx: No oropharyngeal exudate.  Eyes:     Pupils: Pupils are equal, round, and reactive to light.  Neck:     Musculoskeletal: Normal range of motion and neck supple.     Thyroid: No thyromegaly.     Vascular: No JVD.     Trachea: No tracheal deviation.  Cardiovascular:     Rate and Rhythm: Normal rate and regular rhythm.     Heart sounds: Normal heart sounds. No murmur. No friction rub. No gallop.   Pulmonary:     Effort: Pulmonary effort is normal. No respiratory distress.     Breath sounds: Normal breath sounds. No wheezing or rales.  Chest:     Chest wall: No tenderness.  Abdominal:     General: Bowel sounds are normal.     Palpations: Abdomen is soft.     Tenderness: There is no abdominal tenderness.  Musculoskeletal: Normal range of motion.  Lymphadenopathy:     Cervical: No cervical adenopathy.  Skin:    General: Skin is warm and dry.     Findings: Rash present.     Comments: Red, itchy, and irritated appearing rash in both groin area. Skin intact with no drainage present.   Neurological:     Mental Status: He is alert and oriented to person, place, and time.     Cranial Nerves: No cranial nerve deficit.  Psychiatric:        Behavior: Behavior normal.        Thought Content: Thought content normal.        Judgment: Judgment normal.   Assessment/Plan: 1. Type 2 diabetes mellitus with hyperglycemia, without long-term current use of insulin (HCC) - POCT HgB A1C 6.7 today. Continue diabetic medication as prescribed. Monitor blood sugars closely.  Endocrinology appointments as scheduled.    2. Essential hypertension Stable. Continue bp medication as prescribed   3. Cutaneous  candidiasis lotrisone cream should be applied to affected areas twice daily as needed.  - clotrimazole-betamethasone (LOTRISONE) cream; Apply 1 application topically 2 (two) times daily.  Dispense: 45 g; Refill: 2  General Counseling: Bell verbalizes  understanding of the findings of todays visit and agrees with plan of treatment. I have discussed any further diagnostic evaluation that may be needed or ordered today. We also reviewed his medications today. he has been encouraged to call the office with any questions or concerns that should arise related to todays visit.  Diabetes Counseling:  1. Addition of ACE inh/ ARB'S for nephroprotection. Microalbumin is updated  2. Diabetic foot care, prevention of complications. Podiatry consult 3. Exercise and lose weight.  4. Diabetic eye examination, Diabetic eye exam is updated  5. Monitor blood sugar closlely. nutrition counseling.  6. Sign and symptoms of hypoglycemia including shaking sweating,confusion and headaches.  This patient was seen by Leretha Pol FNP Collaboration with Dr Lavera Guise as a part of collaborative care agreement  Orders Placed This Encounter  Procedures  . POCT HgB A1C    Meds ordered this encounter  Medications  . clotrimazole-betamethasone (LOTRISONE) cream    Sig: Apply 1 application topically 2 (two) times daily.    Dispense:  45 g    Refill:  2    Order Specific Question:   Supervising Provider    Answer:   Lavera Guise [id number]    Time spent: 34 Minutes      Dr Lavera Guise Internal medicine

## 2018-12-27 ENCOUNTER — Other Ambulatory Visit: Payer: Self-pay | Admitting: Nurse Practitioner

## 2018-12-27 DIAGNOSIS — Z125 Encounter for screening for malignant neoplasm of prostate: Secondary | ICD-10-CM | POA: Diagnosis not present

## 2018-12-27 DIAGNOSIS — I10 Essential (primary) hypertension: Secondary | ICD-10-CM | POA: Diagnosis not present

## 2018-12-27 DIAGNOSIS — E1165 Type 2 diabetes mellitus with hyperglycemia: Secondary | ICD-10-CM | POA: Diagnosis not present

## 2018-12-27 DIAGNOSIS — Z0001 Encounter for general adult medical examination with abnormal findings: Secondary | ICD-10-CM | POA: Diagnosis not present

## 2018-12-28 LAB — COMPREHENSIVE METABOLIC PANEL
ALT: 23 IU/L (ref 0–44)
AST: 22 IU/L (ref 0–40)
Albumin/Globulin Ratio: 1.8 (ref 1.2–2.2)
Albumin: 4.3 g/dL (ref 3.7–4.7)
Alkaline Phosphatase: 71 IU/L (ref 39–117)
BUN/Creatinine Ratio: 19 (ref 10–24)
BUN: 23 mg/dL (ref 8–27)
Bilirubin Total: 0.7 mg/dL (ref 0.0–1.2)
CO2: 23 mmol/L (ref 20–29)
Calcium: 9.9 mg/dL (ref 8.6–10.2)
Chloride: 99 mmol/L (ref 96–106)
Creatinine, Ser: 1.19 mg/dL (ref 0.76–1.27)
GFR calc Af Amer: 68 mL/min/{1.73_m2} (ref >59)
GFR calc non Af Amer: 59 mL/min/{1.73_m2} — ABNORMAL LOW (ref >59)
Globulin, Total: 2.4 g/dL (ref 1.5–4.5)
Glucose: 204 mg/dL — ABNORMAL HIGH (ref 65–99)
Potassium: 4.9 mmol/L (ref 3.5–5.2)
Sodium: 137 mmol/L (ref 134–144)
Total Protein: 6.7 g/dL (ref 6.0–8.5)

## 2018-12-28 LAB — CBC
Hematocrit: 42.1 % (ref 37.5–51.0)
Hemoglobin: 13.6 g/dL (ref 13.0–17.7)
MCH: 32.2 pg (ref 26.6–33.0)
MCHC: 32.3 g/dL (ref 31.5–35.7)
MCV: 100 fL — ABNORMAL HIGH (ref 79–97)
Platelets: 259 10*3/uL (ref 150–450)
RBC: 4.22 x10E6/uL (ref 4.14–5.80)
RDW: 12.4 % (ref 11.6–15.4)
WBC: 4.9 10*3/uL (ref 3.4–10.8)

## 2018-12-28 LAB — LIPID PANEL W/O CHOL/HDL RATIO
Cholesterol, Total: 128 mg/dL (ref 100–199)
HDL: 80 mg/dL (ref >39)
LDL Chol Calc (NIH): 35 mg/dL (ref 0–99)
Triglycerides: 61 mg/dL (ref 0–149)
VLDL Cholesterol Cal: 13 mg/dL (ref 5–40)

## 2018-12-28 LAB — TSH: TSH: 2.45 u[IU]/mL (ref 0.450–4.500)

## 2018-12-28 LAB — T4, FREE: Free T4: 1.62 ng/dL (ref 0.82–1.77)

## 2018-12-28 LAB — PSA: Prostate Specific Ag, Serum: 0.5 ng/mL (ref 0.0–4.0)

## 2018-12-28 NOTE — Progress Notes (Signed)
Overall labs good. Improved. Discuss with patient during visit.

## 2019-01-01 DIAGNOSIS — E1169 Type 2 diabetes mellitus with other specified complication: Secondary | ICD-10-CM | POA: Diagnosis not present

## 2019-01-01 DIAGNOSIS — E785 Hyperlipidemia, unspecified: Secondary | ICD-10-CM | POA: Diagnosis not present

## 2019-01-01 DIAGNOSIS — N181 Chronic kidney disease, stage 1: Secondary | ICD-10-CM | POA: Diagnosis not present

## 2019-01-01 DIAGNOSIS — Z794 Long term (current) use of insulin: Secondary | ICD-10-CM | POA: Diagnosis not present

## 2019-01-01 DIAGNOSIS — N186 End stage renal disease: Secondary | ICD-10-CM | POA: Diagnosis not present

## 2019-01-01 DIAGNOSIS — E538 Deficiency of other specified B group vitamins: Secondary | ICD-10-CM | POA: Diagnosis not present

## 2019-01-01 DIAGNOSIS — E669 Obesity, unspecified: Secondary | ICD-10-CM | POA: Diagnosis not present

## 2019-01-01 DIAGNOSIS — E1159 Type 2 diabetes mellitus with other circulatory complications: Secondary | ICD-10-CM | POA: Diagnosis not present

## 2019-01-01 DIAGNOSIS — E039 Hypothyroidism, unspecified: Secondary | ICD-10-CM | POA: Diagnosis not present

## 2019-01-01 DIAGNOSIS — I10 Essential (primary) hypertension: Secondary | ICD-10-CM | POA: Diagnosis not present

## 2019-01-01 DIAGNOSIS — E1122 Type 2 diabetes mellitus with diabetic chronic kidney disease: Secondary | ICD-10-CM | POA: Diagnosis not present

## 2019-01-01 DIAGNOSIS — Z992 Dependence on renal dialysis: Secondary | ICD-10-CM | POA: Diagnosis not present

## 2019-01-02 ENCOUNTER — Ambulatory Visit: Payer: Medicare Other | Admitting: Internal Medicine

## 2019-01-02 ENCOUNTER — Telehealth: Payer: Self-pay

## 2019-01-02 NOTE — Telephone Encounter (Signed)
Confirmed pt sleep study appt. Allen [name]

## 2019-01-03 ENCOUNTER — Ambulatory Visit (INDEPENDENT_AMBULATORY_CARE_PROVIDER_SITE_OTHER): Payer: Medicare Other | Admitting: Internal Medicine

## 2019-01-03 DIAGNOSIS — G4733 Obstructive sleep apnea (adult) (pediatric): Secondary | ICD-10-CM

## 2019-01-03 DIAGNOSIS — Z9989 Dependence on other enabling machines and devices: Secondary | ICD-10-CM

## 2019-01-08 ENCOUNTER — Other Ambulatory Visit: Payer: Self-pay

## 2019-01-08 MED ORDER — METOPROLOL SUCCINATE ER 25 MG PO TB24: 25.0000 mg | ORAL_TABLET | Freq: Every day | ORAL | 1 refills | Status: DC

## 2019-01-08 MED ORDER — ENALAPRIL MALEATE 20 MG PO TABS: 20.0000 mg | ORAL_TABLET | Freq: Every day | ORAL | 2 refills | Status: DC

## 2019-01-09 ENCOUNTER — Telehealth: Payer: Self-pay

## 2019-01-09 NOTE — Telephone Encounter (Signed)
Confirmed appointment with patient. klh °

## 2019-01-10 ENCOUNTER — Other Ambulatory Visit: Payer: Self-pay

## 2019-01-12 LAB — POCT GLYCOSYLATED HEMOGLOBIN (HGB A1C): Hemoglobin A1C: 6.7 % — AB (ref 4.0–5.6)

## 2019-01-15 DIAGNOSIS — M5416 Radiculopathy, lumbar region: Secondary | ICD-10-CM | POA: Diagnosis not present

## 2019-01-15 DIAGNOSIS — M5136 Other intervertebral disc degeneration, lumbar region: Secondary | ICD-10-CM | POA: Diagnosis not present

## 2019-01-15 DIAGNOSIS — M48062 Spinal stenosis, lumbar region with neurogenic claudication: Secondary | ICD-10-CM | POA: Diagnosis not present

## 2019-01-16 ENCOUNTER — Ambulatory Visit (INDEPENDENT_AMBULATORY_CARE_PROVIDER_SITE_OTHER): Payer: Medicare Other | Admitting: Internal Medicine

## 2019-01-16 VITALS — BP 136/58 | HR 65 | Temp 96.7°F | Resp 16 | Ht 68.0 in | Wt 223.0 lb

## 2019-01-16 DIAGNOSIS — I10 Essential (primary) hypertension: Secondary | ICD-10-CM

## 2019-01-16 DIAGNOSIS — I2510 Atherosclerotic heart disease of native coronary artery without angina pectoris: Secondary | ICD-10-CM

## 2019-01-16 DIAGNOSIS — G4733 Obstructive sleep apnea (adult) (pediatric): Secondary | ICD-10-CM

## 2019-01-16 DIAGNOSIS — Z9989 Dependence on other enabling machines and devices: Secondary | ICD-10-CM

## 2019-01-17 ENCOUNTER — Encounter: Payer: Self-pay | Admitting: Internal Medicine

## 2019-01-18 ENCOUNTER — Telehealth: Payer: Self-pay

## 2019-01-18 NOTE — Telephone Encounter (Signed)
Confirmed 01-22-19 ov as virtual.

## 2019-01-18 NOTE — Telephone Encounter (Signed)
Gave [facility] new cpap RX orders for a new set up schd for 02/14/2019. beth

## 2019-01-22 ENCOUNTER — Encounter: Payer: Self-pay | Admitting: Nurse Practitioner

## 2019-01-22 ENCOUNTER — Other Ambulatory Visit: Payer: Self-pay

## 2019-01-22 ENCOUNTER — Ambulatory Visit (INDEPENDENT_AMBULATORY_CARE_PROVIDER_SITE_OTHER): Payer: Medicare Other | Admitting: Nurse Practitioner

## 2019-01-22 VITALS — BP 136/52 | HR 66 | Temp 96.7°F | Ht 68.0 in | Wt 222.0 lb

## 2019-01-22 DIAGNOSIS — I10 Essential (primary) hypertension: Secondary | ICD-10-CM | POA: Diagnosis not present

## 2019-01-22 DIAGNOSIS — Z23 Encounter for immunization: Secondary | ICD-10-CM | POA: Diagnosis not present

## 2019-01-22 DIAGNOSIS — E1165 Type 2 diabetes mellitus with hyperglycemia: Secondary | ICD-10-CM

## 2019-01-22 DIAGNOSIS — Z0001 Encounter for general adult medical examination with abnormal findings: Secondary | ICD-10-CM

## 2019-01-22 MED ORDER — PNEUMOVAX 23 25 MCG/0.5ML IJ INJ: INJECTION | INTRAMUSCULAR | 0 refills | Status: DC

## 2019-01-22 MED ORDER — ZOSTER VAC RECOMB ADJUVANTED 50 MCG/0.5ML IM SUSR: INTRAMUSCULAR | 1 refills | Status: DC

## 2019-01-22 NOTE — Progress Notes (Signed)
[facility] [place], [place] [address]  Internal MEDICINE  Telephone Visit  Patient Name: Allen [name]  [id number]  [id number]  Date of Service: 01/22/2019  I connected with the patient at 2:19pm by webcam and verified the patients identity using two identifiers.   I discussed the limitations, risks, security and privacy concerns of performing an evaluation and management service by webcam and the availability of in person appointments. I also discussed with the patient that there may be a patient responsible charge related to the service.  The patient expressed understanding and agrees to proceed.    Chief Complaint  Patient presents with  . Telephone Assessment  . Telephone Screen  . Medicare Wellness  . Diabetes  . Hypertension    The patient has been contacted via webcam for follow up visit due to concerns for spread of novel coronavirus. He presents today for health maintenance exam. The patient is concerned that diastolic blood pressure is consistently running in the 50s. Diastolic pressure is generally in the 130s. Heart rate is generally in the 60s and 70s. He states that he feels well and denies chest pain, chest pressure, or shortness of breath. He denies headache or dizziness. He does see endocrinology for diabetes. Blood sugars are well controlled.       Current Medication: Outpatient Encounter Medications as of 01/22/2019  Medication Sig Note  . aspirin 81 MG chewable tablet Chew by mouth daily.   Marland Kitchen b complex vitamins tablet Take 1 tablet by mouth daily.   . B-D ULTRA-FINE 33 LANCETS MISC Use 1 Lancet 3 (three) times daily. 11/24/2015: Received from: [place]  . cetirizine (ZYRTEC) 10 MG tablet Take 10 mg by mouth daily.   . Cholecalciferol (VITAMIN D3) 1000 units CAPS Take by mouth.   . clotrimazole-betamethasone (LOTRISONE) cream Apply 1 application topically 2 (two) times daily.   . Continuous Blood Gluc Sensor (FREESTYLE  LIBRE SENSOR SYSTEM) MISC Use 3 each every 10 (ten) days.   . enalapril (VASOTEC) 20 MG tablet Take 1 tablet (20 mg total) by mouth daily.   Marland Kitchen ezetimibe-simvastatin (VYTORIN) 10-40 MG tablet Take 1 tablet by mouth daily.   Marland Kitchen FLUZONE HIGH-DOSE 0.5 ML injection ADM 0.5ML IM UTD   . folic acid (FOLVITE) A999333 MCG tablet Take 400 mcg by mouth daily.   . Garlic 123XX123 MG CAPS Take by mouth.   Marland Kitchen glucose blood (ONE TOUCH ULTRA TEST) test strip Check blood sugar 5x a day as directed   . hydrochlorothiazide (MICROZIDE) 12.5 MG capsule Take 12.5 mg by mouth daily.   . insulin lispro (HUMALOG) 100 UNIT/ML injection Inject 6-34 Units into the skin 3 (three) times daily before meals.    . Insulin Pen Needle (BD PEN NEEDLE NANO U/F) 32G X 4 MM MISC USE AS DIRECTED FOUR TIMES A DAY WITH LEVEMIR FLEXTOUCH AND HUMALOG PEN.  DX CODE E11.29   . Insulin Syringe-Needle U-100 (BD INSULIN SYRINGE U/F) 31G X 5/16" 0.3 ML MISC Inject insulin [place] TID as indicated per sliding scale instuctions.   Marland Kitchen JARDIANCE 25 MG TABS tablet Take 0.5 mg by mouth daily.    Marland Kitchen levothyroxine (SYNTHROID, LEVOTHROID) 50 MCG tablet Take 50 mcg by mouth daily before breakfast.   . metFORMIN (GLUCOPHAGE) 1000 MG tablet Take 1,000 mg by mouth daily with breakfast.    . metFORMIN (GLUCOPHAGE) 500 MG tablet Take 1,000 mg by mouth daily with breakfast.   . metoprolol succinate (TOPROL-XL) 25 MG 24 hr  tablet Take 1 tablet (25 mg total) by mouth daily.   . Multiple Vitamin (MULTIVITAMIN WITH MINERALS) TABS tablet Take 1 tablet by mouth daily.   . mupirocin ointment (BACTROBAN) 2 % Apply topically two times daily   . nystatin ointment (MYCOSTATIN) Apply 1 application topically 3 (three) times daily. (Patient taking differently: Apply 1 application topically as needed. )   . pioglitazone (ACTOS) 30 MG tablet Take 30 mg by mouth daily.    . traMADol (ULTRAM) 50 MG tablet Take 100 mg by mouth daily.  11/24/2015: Received from: [place]: 1-2 po bid prn  . triamcinolone cream (KENALOG) 0.1 % Apply 1 application topically 2 (two) times daily.   . vitamin B-12 (CYANOCOBALAMIN) 1000 MCG tablet Take 1,000 mcg by mouth daily.   . pneumococcal 23 valent vaccine (PNEUMOVAX 23) 25 MCG/0.5ML injection Inject 0.32ml IM once   . Zoster Vaccine Adjuvanted [facility]) injection Shingles vaccine series - inject IM as directed .    No facility-administered encounter medications on file as of 01/22/2019.     Surgical History: Past Surgical History:  Procedure Laterality Date  . CARDIAC CATHETERIZATION  1989  . CARDIAC CATHETERIZATION  1990  . GALLBLADDER SURGERY    . LEG SURGERY Right    stent placement    Medical History: Past Medical History:  Diagnosis Date  . Coronary artery disease   . Diabetes mellitus without complication ([place])   . Heart disease   . Hypertension   . MI (myocardial infarction) ([place])   . Sleep apnea     Family History: Family History  Problem Relation Age of Onset  . Diabetes Mother   . Brain cancer Mother   . Throat cancer Father   . Prostate cancer Brother     Social History   Socioeconomic History  . Marital status: Married    Spouse name: Not on file  . Number of children: Not on file  . Years of education: Not on file  . Highest education level: Not on file  Occupational History  . Not on file  Social Needs  . Financial resource strain: Not on file  . Food insecurity    Worry: Not on file    Inability: Not on file  . Transportation needs    Medical: Not on file    Non-medical: Not on file  Tobacco Use  . Smoking status: Former Smoker    Packs/day: 3.00    Years: 32.00    Pack years: 96.00    Types: Cigarettes  . Smokeless tobacco: Never Used  Substance and Sexual Activity  . Alcohol use: No    Frequency: Never  . Drug use: No  . Sexual activity: Not on file  Lifestyle  . Physical activity    Days per week: Not on file    Minutes per session: Not on file  .  Stress: Not on file  Relationships  . Social Herbalist on phone: Not on file    Gets together: Not on file    Attends religious service: Not on file    Active member of club or organization: Not on file    Attends meetings of clubs or organizations: Not on file    Relationship status: Not on file  . Intimate partner violence    Fear of current or ex partner: Not on file    Emotionally abused: Not on file    Physically abused: Not on file  Forced sexual activity: Not on file  Other Topics Concern  . Not on file  Social History Narrative  . Not on file      Review of Systems  Constitutional: Negative for activity change, chills, fatigue and unexpected weight change.  HENT: Negative for congestion, postnasal drip, rhinorrhea, sneezing and sore throat.   Respiratory: Negative for cough, chest tightness, shortness of breath and wheezing.   Cardiovascular: Negative for chest pain and palpitations.  Gastrointestinal: Negative for abdominal pain, constipation, diarrhea, nausea and vomiting.  Endocrine: Negative for cold intolerance, heat intolerance, polydipsia and polyuria.       Blood sugars doing well    Musculoskeletal: Negative for arthralgias, back pain, joint swelling and neck pain.  Skin: Negative for rash.       .   Allergic/Immunologic: Negative for environmental allergies.  Neurological: Negative for dizziness, tremors, numbness and headaches.  Hematological: Negative for adenopathy. Does not bruise/bleed easily.  Psychiatric/Behavioral: Negative for behavioral problems (Depression), sleep disturbance and suicidal ideas. The patient is not nervous/anxious.     Today's Vitals   01/22/19 1401  BP: (!) 136/52  Pulse: 66  Temp: (!) 96.7 F (35.9 C)  Weight: 222 lb (100.7 kg)  Height: 5\' 8"  (1.727 m)   Body mass index is 33.75 kg/m.   Observation/Objective:  The patient is alert and oriented. He is pleasant and answering all questions appropriately.  Breathing is non-labored. He is in no acute distress.   Depression screen [facility] 2/9 01/22/2019 12/26/2018 01/16/2018 09/06/2017 07/14/2017  Decreased Interest 0 0 0 0 0  Down, Depressed, Hopeless 0 0 0 0 0  PHQ - 2 Score 0 0 0 0 0    Functional Status Survey: Is the patient deaf or have difficulty hearing?: Yes Does the patient have difficulty seeing, even when wearing glasses/contacts?: No Does the patient have difficulty concentrating, remembering, or making decisions?: No Does the patient have difficulty walking or climbing stairs?: Yes Does the patient have difficulty dressing or bathing?: No Does the patient have difficulty doing errands alone such as visiting a doctor's office or shopping?: No  MMSE - Nordic Exam 01/22/2019 01/16/2018  Orientation to time 5 5  Orientation to Place 5 5  Registration 3 3  Attention/ Calculation 5 5  Recall 3 3  Language- name 2 objects 2 2  Language- repeat 1 1  Language- follow 3 step command 3 3  Language- read & follow direction 1 1  Write a sentence 1 1  Copy design 1 1  Total score 30 30    Fall Risk  01/22/2019 12/26/2018 01/16/2018 09/06/2017 07/14/2017  Falls in the past year? 0 0 0 No No     Assessment/Plan: 1. Encounter for general adult medical examination with abnormal findings Annual health maintenance exam today  2. Type 2 diabetes mellitus with hyperglycemia, without long-term current use of insulin (HCC) Blood sugars well controlled. Continue diabetic medication as prescribed. Will perform foot exam during his next, in-office visit.   3. Essential hypertension Stable. Continue bp medication as prescribed   4. Need for shingles vaccine Prescription for shingrix vaccine series sent to his pharmacy for administration.  - Zoster Vaccine Adjuvanted [facility]) injection; Shingles vaccine series - inject IM as directed .  Dispense: 0.5 mL; Refill: 1  5. Need for vaccination against Streptococcus pneumoniae using  pneumococcal conjugate vaccine 7 Prescription for pneumovax sent to his pharmacy for administration.  - pneumococcal 23 valent vaccine (PNEUMOVAX 23) 25 MCG/0.5ML injection; Inject  0.20ml IM once  Dispense: 0.5 mL; Refill: 0  General Counseling: harper kise understanding of the findings of today's phone visit and agrees with plan of treatment. I have discussed any further diagnostic evaluation that may be needed or ordered today. We also reviewed his medications today. he has been encouraged to call the office with any questions or concerns that should arise related to todays visit.  Hypertension Counseling:   The following hypertensive lifestyle modification were recommended and discussed:  1. Limiting alcohol intake to less than 1 oz/day of ethanol:(24 oz of beer or 8 oz of wine or 2 oz of 100-proof whiskey). 2. Take baby ASA 81 mg daily. 3. Importance of regular aerobic exercise and losing weight. 4. Reduce dietary saturated fat and cholesterol intake for overall cardiovascular health. 5. Maintaining adequate dietary potassium, calcium, and magnesium intake. 6. Regular monitoring of the blood pressure. 7. Reduce sodium intake to less than 100 mmol/day (less than 2.3 gm of sodium or less than 6 gm of sodium choride)   This patient was seen by [place] with Dr Lavera Guise as a part of collaborative care agreement  Meds ordered this encounter  Medications  . pneumococcal 23 valent vaccine (PNEUMOVAX 23) 25 MCG/0.5ML injection    Sig: Inject 0.59ml IM once    Dispense:  0.5 mL    Refill:  0    Order Specific Question:   Supervising Provider    Answer:   Lavera Guise [id number]  . Zoster Vaccine Adjuvanted [facility]) injection    Sig: Shingles vaccine series - inject IM as directed .    Dispense:  0.5 mL    Refill:  1    Order Specific Question:   Supervising Provider    Answer:   Lavera Guise [[id number]]    Time spent: 76 Minutes    Dr Lavera Guise Internal  medicine

## 2019-01-23 ENCOUNTER — Ambulatory Visit: Payer: Self-pay | Admitting: Internal Medicine

## 2019-01-28 NOTE — Procedures (Signed)
[place] [address] [place], [place] [address]  Patient Name: Allen [name] DOB: 24-Apr-1940   SLEEP STUDY INTERPRETATION  DATE OF SERVICE: January 03, 2019   SLEEP STUDY HISTORY: This patient is referred to the sleep lab for a baseline Polysomnography. Pertinent history includes a history of diagnosis of excessive daytime somnolence and snoring.  PROCEDURE: This overnight polysomnogram was performed using the Alice 5 acquisition system using the standard diagnostic protocol as outlined by the AASM. This includes 6 channels of EEG, 2 channelscannels of EOG, chin EMG, bilateral anterior tibialis EMG, nasal/oral thermister, PTAF, chest and abdominal wall movements, ECG and pulse oximetry. Apneas and Hypopneas were scored per AASM definition.  SLEEP ARCHITECHTURE: This is a baseline polysomnograph  study. The total recording time was 407.3 minutes and the patients total sleep time is noted to be 321.5 minutes. Sleep onset latency was 49 minutes and is prolonged.  Stage R sleep onset latency was 203.5 minutes. Sleep maintenance efficiency was 79.4% and is decreased.  Sleep staging expressed as a percentage of total sleep time demonstrated 7.0% N1, 51.9% N2 and 32.0% N3  sleep. Stage R represents 9.0% of total sleep time. This is decreased.  There were a total of 71 arousals  for an overall arousal index of 13.3 per hour of sleep. PLMS arousal were not noted. Arousals without respiratory events are  noted. This can contribute to sleep architechture disruption.  RESPIRATORY MONITORING:   Patient exhibits significant evidence of sleep disorderd breathing characterized by 4 central apneas, 6 obstructive apneas and 0 mixed apneas. There were 2 obstructive hypopneas and 2 RERAs. Most of the apneas/hypopneas were of obstructive variety. The total apnea hypopnea index (apneas and hypopneas per hour of sleep) is 2.2 respiratory events per hour and is within normal limits.  Respiratory  monitoring demonstrated mild snoring through the night. There are a total of 19 snoring episodes representing 1.0% of sleep.   Baseline oxygen saturation during wakefulness was 95% and during NREM sleep averaged 95% through the night. Arterial saturation during REM sleep was 96% through the night. There was significant  oxygen desaturation with the respiratory events. Arterial oxygen desaturation occurred of at least 4% was noted with a low saturation of 88%. The study was performed off oxygen.  CARDIAC MONITORING:   Average heart rate is 68 during sleep with a high of 100 beats per minute. Malignant arrhythmias were not noted.  CPAP titration: Patient was titrated on CPAP according to the lab protocol.  Patient was started on a CPAP of 5 and gradually increased up to a maximal pressure of 9 cm water pressure.  It appears the best response to therapy is on a CPAP of 9 cm water pressure.  Patient had 153.2 minutes of sleep with 29.0 minutes of stage R sleep.  Patient's apnea-hypopnea index was 1.0 the lowest oxygen saturation was 93%.  Snoring was also controlled.  Patient appeared to tolerate the pressure fairly well.  IMPRESSIONS:  --This overnight polysomnogram demonstrates insignificant sleep apnea with an overall AHI 2.2 per hour. --The overall AHI was no worse  during Stage R. --There were associated some significant arterial oxygen desaturations noted with a low saturation of 88% --There was no significant PLMS noted in this study. --There is very mild snoring noted throughout the study.    RECOMMENDATIONS:  --CPAP titration study is adequate to control this patient's obstructive sleep apnea.  The optimal pressure appears to be a CPAP of 9 cm water pressure.. --Nasal decongestants  and antihistamines may be of help for increased upper airways resistance when present. --Weight loss through dietary and lifestyle modification is recommended in the presence of obesity. --A search for and  treatment of any underlying cardiopulmonary disease is      recommended in the presence of oxygen desaturations. --Alternative treatment options if the patient is not willing to use CPAP include oral   appliances as well as surgical intervention which may help in the appropriate patient. --Clinical correlation is recommended. Please feel free to call the office for any further  questions or assistance in the care of this patient.     Allyne Gee, MD [facility] Pulmonary Critical Care Medicine Sleep medicine

## 2019-01-29 ENCOUNTER — Other Ambulatory Visit: Payer: Self-pay

## 2019-01-29 ENCOUNTER — Encounter (INDEPENDENT_AMBULATORY_CARE_PROVIDER_SITE_OTHER): Payer: Medicare Other

## 2019-01-29 ENCOUNTER — Ambulatory Visit (INDEPENDENT_AMBULATORY_CARE_PROVIDER_SITE_OTHER): Payer: Medicare Other | Admitting: Vascular Surgery

## 2019-01-29 MED ORDER — METOPROLOL SUCCINATE ER 25 MG PO TB24: 25.0000 mg | ORAL_TABLET | Freq: Every day | ORAL | 1 refills | Status: DC

## 2019-01-30 ENCOUNTER — Ambulatory Visit: Payer: Self-pay | Admitting: Internal Medicine

## 2019-02-13 ENCOUNTER — Telehealth: Payer: Self-pay

## 2019-02-13 NOTE — Telephone Encounter (Signed)
Confirmed appointment with patient. klh °

## 2019-02-14 ENCOUNTER — Ambulatory Visit (INDEPENDENT_AMBULATORY_CARE_PROVIDER_SITE_OTHER): Payer: Medicare Other

## 2019-02-14 ENCOUNTER — Other Ambulatory Visit: Payer: Self-pay

## 2019-02-14 DIAGNOSIS — G4733 Obstructive sleep apnea (adult) (pediatric): Secondary | ICD-10-CM | POA: Diagnosis not present

## 2019-02-14 NOTE — Progress Notes (Signed)
New Cpap Setup   Allen [name] was setup on resmed auto cpap at 9 cmH2o with a heated humidifier, climate line and he going to try a resmed N30 nasal mask med. He has good understanding of cleaning and compliance with cpap.

## 2019-02-14 NOTE — Progress Notes (Signed)
[facility] [place], [place] [address]  Pulmonary Sleep Medicine   Office Visit Note  Patient Name: Allen [name] DOB: 1940/04/29 MRN [id number]  Date of Service: 02/14/2019  Complaints/HPI: Pt is here for follow up on cpap titration study.  His titration study showed an optimal pressure of 9 cmH2O.  Patient's current machine is over 76 years old and making noises.  Will order new machine for him at this time with current settings.  ROS  General: (-) fever, (-) chills, (-) night sweats, (-) weakness Skin: (-) rashes, (-) itching,. Eyes: (-) visual changes, (-) redness, (-) itching. Nose and Sinuses: (-) nasal stuffiness or itchiness, (-) postnasal drip, (-) nosebleeds, (-) sinus trouble. Mouth and Throat: (-) sore throat, (-) hoarseness. Neck: (-) swollen glands, (-) enlarged thyroid, (-) neck pain. Respiratory: - cough, (-) bloody sputum, - shortness of breath, - wheezing. Cardiovascular: - ankle swelling, (-) chest pain. Lymphatic: (-) lymph node enlargement. Neurologic: (-) numbness, (-) tingling. Psychiatric: (-) anxiety, (-) depression   Current Medication: Outpatient Encounter Medications as of 01/16/2019  Medication Sig Note  . aspirin 81 MG chewable tablet Chew by mouth daily.   Marland Kitchen b complex vitamins tablet Take 1 tablet by mouth daily.   . B-D ULTRA-FINE 33 LANCETS MISC Use 1 Lancet 3 (three) times daily. 11/24/2015: Received from: [facility]  . cetirizine (ZYRTEC) 10 MG tablet Take 10 mg by mouth daily.   . Cholecalciferol (VITAMIN D3) 1000 units CAPS Take by mouth.   . clotrimazole-betamethasone (LOTRISONE) cream Apply 1 application topically 2 (two) times daily.   . Continuous Blood Gluc Sensor (FREESTYLE LIBRE SENSOR SYSTEM) MISC Use 3 each every 10 (ten) days.   . enalapril (VASOTEC) 20 MG tablet Take 1 tablet (20 mg total) by mouth daily.   Marland Kitchen ezetimibe-simvastatin (VYTORIN) 10-40 MG tablet Take 1 tablet by mouth daily.   Marland Kitchen  FLUZONE HIGH-DOSE 0.5 ML injection ADM 0.5ML IM UTD   . folic acid (FOLVITE) A999333 MCG tablet Take 400 mcg by mouth daily.   . Garlic 123XX123 MG CAPS Take by mouth.   Marland Kitchen glucose blood (ONE TOUCH ULTRA TEST) test strip Check blood sugar 5x a day as directed   . hydrochlorothiazide (MICROZIDE) 12.5 MG capsule Take 12.5 mg by mouth daily.   . insulin lispro (HUMALOG) 100 UNIT/ML injection Inject 6-34 Units into the skin 3 (three) times daily before meals.    . Insulin Pen Needle (BD PEN NEEDLE NANO U/F) 32G X 4 MM MISC USE AS DIRECTED FOUR TIMES A DAY WITH LEVEMIR FLEXTOUCH AND HUMALOG PEN.  DX CODE E11.29   . Insulin Syringe-Needle U-100 (BD INSULIN SYRINGE U/F) 31G X 5/16" 0.3 ML MISC Inject insulin [place] TID as indicated per sliding scale instuctions.   Marland Kitchen JARDIANCE 25 MG TABS tablet Take 0.5 mg by mouth daily.    Marland Kitchen levothyroxine (SYNTHROID, LEVOTHROID) 50 MCG tablet Take 50 mcg by mouth daily before breakfast.   . metFORMIN (GLUCOPHAGE) 1000 MG tablet Take 1,000 mg by mouth daily with breakfast.    . metFORMIN (GLUCOPHAGE) 500 MG tablet Take 1,000 mg by mouth daily with breakfast.   . Multiple Vitamin (MULTIVITAMIN WITH MINERALS) TABS tablet Take 1 tablet by mouth daily.   . mupirocin ointment (BACTROBAN) 2 % Apply topically two times daily   . nystatin ointment (MYCOSTATIN) Apply 1 application topically 3 (three) times daily. (Patient taking differently: Apply 1 application topically as needed. )   . pioglitazone (ACTOS) 30  MG tablet Take 30 mg by mouth daily.    . traMADol (ULTRAM) 50 MG tablet Take 100 mg by mouth daily.  11/24/2015: Received from: [facility]: 1-2 po bid prn  . triamcinolone cream (KENALOG) 0.1 % Apply 1 application topically 2 (two) times daily.   . vitamin B-12 (CYANOCOBALAMIN) 1000 MCG tablet Take 1,000 mcg by mouth daily.   . [DISCONTINUED] metoprolol succinate (TOPROL-XL) 25 MG 24 hr tablet Take 1 tablet (25 mg total) by mouth daily.    No  facility-administered encounter medications on file as of 01/16/2019.    Surgical History: Past Surgical History:  Procedure Laterality Date  . CARDIAC CATHETERIZATION  1989  . CARDIAC CATHETERIZATION  1990  . GALLBLADDER SURGERY    . LEG SURGERY Right    stent placement    Medical History: Past Medical History:  Diagnosis Date  . Coronary artery disease   . Diabetes mellitus without complication ([place])   . Heart disease   . Hypertension   . MI (myocardial infarction) ([place])   . Sleep apnea     Family History: Family History  Problem Relation Age of Onset  . Diabetes Mother   . Brain cancer Mother   . Throat cancer Father   . Prostate cancer Brother     Social History: Social History   Socioeconomic History  . Marital status: Married    Spouse name: Not on file  . Number of children: Not on file  . Years of education: Not on file  . Highest education level: Not on file  Occupational History  . Not on file  Tobacco Use  . Smoking status: Former Smoker    Packs/day: 3.00    Years: 32.00    Pack years: 96.00    Types: Cigarettes  . Smokeless tobacco: Never Used  Substance and Sexual Activity  . Alcohol use: No  . Drug use: No  . Sexual activity: Not on file  Other Topics Concern  . Not on file  Social History Narrative  . Not on file   Social Determinants of Health   Financial Resource Strain:   . Difficulty of Paying Living Expenses: Not on file  Food Insecurity:   . Worried About Charity fundraiser in the Last Year: Not on file  . Ran Out of Food in the Last Year: Not on file  Transportation Needs:   . Lack of Transportation (Medical): Not on file  . Lack of Transportation (Non-Medical): Not on file  Physical Activity:   . Days of Exercise per Week: Not on file  . Minutes of Exercise per Session: Not on file  Stress:   . Feeling of Stress : Not on file  Social Connections:   . Frequency of Communication with Friends and Family: Not on file  .  Frequency of Social Gatherings with Friends and Family: Not on file  . Attends Religious Services: Not on file  . Active Member of Clubs or Organizations: Not on file  . Attends Archivist Meetings: Not on file  . Marital Status: Not on file  Intimate Partner Violence:   . Fear of Current or Ex-Partner: Not on file  . Emotionally Abused: Not on file  . Physically Abused: Not on file  . Sexually Abused: Not on file    Vital Signs: Blood pressure (!) 136/58, pulse 65, temperature (!) 96.7 F (35.9 C), temperature source Temporal, resp. rate 16, height 5\' 8"  (1.727 m), weight 223 lb (  101.2 kg), SpO2 99 %.  Examination: General Appearance: The patient is well-developed, well-nourished, and in no distress. Skin: Gross inspection of skin unremarkable. Head: normocephalic, no gross deformities. Eyes: no gross deformities noted. ENT: ears appear grossly normal no exudates. Neck: Supple. No thyromegaly. No LAD. Respiratory: clear bilateraly. Cardiovascular: Normal S1 and S2 without murmur or rub. Extremities: No cyanosis. pulses are equal. Neurologic: Alert and oriented. No involuntary movements.  LABS: Recent Results (from the past 2160 hour(s))  Comprehensive metabolic panel     Status: Abnormal   Collection Time: 12/27/18  1:20 PM  Result Value Ref Range   Glucose 204 (H) 65 - 99 mg/dL   BUN 23 8 - 27 mg/dL   Creatinine, Ser 1.19 0.76 - 1.27 mg/dL   GFR calc non Af Amer 59 (L) >59 mL/min/1.73   GFR calc Af Amer 68 >59 mL/min/1.73   BUN/Creatinine Ratio 19 10 - 24   Sodium 137 134 - 144 mmol/L   Potassium 4.9 3.5 - 5.2 mmol/L   Chloride 99 96 - 106 mmol/L   CO2 23 20 - 29 mmol/L   Calcium 9.9 8.6 - 10.2 mg/dL   Total Protein 6.7 6.0 - 8.5 g/dL   Albumin 4.3 3.7 - 4.7 g/dL   Globulin, Total 2.4 1.5 - 4.5 g/dL   Albumin/Globulin Ratio 1.8 1.2 - 2.2   Bilirubin Total 0.7 0.0 - 1.2 mg/dL   Alkaline Phosphatase 71 39 - 117 IU/L   AST 22 0 - 40 IU/L   ALT 23 0 - 44  IU/L  CBC     Status: Abnormal   Collection Time: 12/27/18  1:20 PM  Result Value Ref Range   WBC 4.9 3.4 - 10.8 x10E3/uL   RBC 4.22 4.14 - 5.80 x10E6/uL   Hemoglobin 13.6 13.0 - 17.7 g/dL   Hematocrit 42.1 37.5 - 51.0 %   MCV 100 (H) 79 - 97 fL   MCH 32.2 26.6 - 33.0 pg   MCHC 32.3 31.5 - 35.7 g/dL   RDW 12.4 11.6 - 15.4 %   Platelets 259 150 - 450 x10E3/uL  Lipid Panel w/o Chol/HDL Ratio     Status: None   Collection Time: 12/27/18  1:20 PM  Result Value Ref Range   Cholesterol, Total 128 100 - 199 mg/dL   Triglycerides 61 0 - 149 mg/dL   HDL 80 >39 mg/dL   VLDL Cholesterol Cal 13 5 - 40 mg/dL   LDL Chol Calc (NIH) 35 0 - 99 mg/dL  T4, free     Status: None   Collection Time: 12/27/18  1:20 PM  Result Value Ref Range   Free T4 1.62 0.82 - 1.77 ng/dL  TSH     Status: None   Collection Time: 12/27/18  1:20 PM  Result Value Ref Range   TSH 2.450 0.450 - 4.500 uIU/mL  PSA     Status: None   Collection Time: 12/27/18  1:20 PM  Result Value Ref Range   Prostate Specific Ag, Serum 0.5 0.0 - 4.0 ng/mL    Comment: Roche ECLIA methodology. According to the American Urological Association, Serum PSA should decrease and remain at undetectable levels after radical prostatectomy. The AUA defines biochemical recurrence as an initial PSA value 0.2 ng/mL or greater followed by a subsequent confirmatory PSA value 0.2 ng/mL or greater. Values obtained with different assay methods or kits cannot be used interchangeably. Results cannot be interpreted as absolute evidence of the presence or absence of malignant disease.  POCT HgB A1C     Status: Abnormal   Collection Time: 01/12/19  6:49 PM  Result Value Ref Range   Hemoglobin A1C 6.7 (A) 4.0 - 5.6 %   HbA1c POC (<> result, manual entry)     HbA1c, POC (prediabetic range)     HbA1c, POC (controlled diabetic range)      Radiology: DG Chest 2 View  Result Date: 01/08/2018 CLINICAL DATA:  Fever yesterday. Vomiting today. Dizziness  and chest pain. EXAM: CHEST - 2 VIEW COMPARISON:  07/06/2015 FINDINGS: Normal heart size and pulmonary vascularity. No focal airspace disease or consolidation in the lungs. No blunting of costophrenic angles. No pneumothorax. Mediastinal contours appear intact. Calcification of the aorta. Degenerative changes in the spine and shoulders. IMPRESSION: No active cardiopulmonary disease. Electronically Signed   By: Lucienne Capers M.D.   On: 01/08/2018 01:35   CT Angio Chest/Abd/Pel for Dissection W and/or W/WO  Result Date: 01/08/2018 CLINICAL DATA:  Chest pain, upper abdominal pain with nausea and vomiting. Fever. EXAM: CT ANGIOGRAPHY CHEST, ABDOMEN AND PELVIS TECHNIQUE: Multidetector CT imaging through the chest, abdomen and pelvis was performed using the standard protocol during bolus administration of intravenous contrast. Multiplanar reconstructed images and MIPs were obtained and reviewed to evaluate the vascular anatomy. CONTRAST:  139mL ISOVUE-370 IOPAMIDOL (ISOVUE-370) INJECTION 76% COMPARISON:  None. FINDINGS: CTA CHEST FINDINGS Cardiovascular: No thoracic aortic aneurysm or thoracic aortic dissection. Scattered aortic atherosclerosis. Mild cardiomegaly. Three-vessel coronary artery calcifications. No pericardial effusion. No pulmonary embolism identified. Mediastinum/Nodes: No mass or enlarged lymph nodes seen within the mediastinum or perihilar regions. Esophagus appears normal. Trachea and central bronchi are unremarkable. Lungs/Pleura: Lungs are clear. No pleural effusion or pneumothorax. Musculoskeletal: No acute or suspicious osseous finding. Degenerative spondylosis throughout the thoracic spine, mild to moderate in degree. Review of the MIP images confirms the above findings. CTA ABDOMEN AND PELVIS FINDINGS VASCULAR Aorta: Normal caliber aorta without aneurysm, dissection, vasculitis or significant stenosis. Fairly extensive aortic atherosclerosis. Celiac: Celiac artery trunk is patent without  evidence of aneurysm, dissection, vasculitis or significant stenosis. Normal contrast flow is seen to the RIGHT and LEFT hepatic arteries, although perhaps slightly diminished to the LEFT liver lobe. Normal contrast flow is seen to the splenic artery. SMA: Patent without evidence of aneurysm, dissection, vasculitis or significant stenosis. Renals: Both renal arteries are patent without evidence of aneurysm, dissection, vasculitis, fibromuscular dysplasia or significant stenosis. IMA: Patent without evidence of aneurysm, dissection, vasculitis or significant stenosis. Inflow: Patent without evidence of aneurysm, dissection, vasculitis or significant stenosis. Veins: No obvious venous abnormality within the limitations of this arterial phase study. However, the main portal vein is prominent, measuring 1.9 cm diameter. Review of the MIP images confirms the above findings. NON-VASCULAR Hepatobiliary: Relatively diminished contrast enhancement of the LEFT liver lobe, compared to the RIGHT hepatic lobe. No focal mass or lesion within either liver lobe. Gallbladder walls appear thickened and there is at least mild pericholecystic edema suggesting acute cholecystitis. Common bile duct dilatation. 3 mm calcific density at the level of the distal common bile duct, highly suspicious for obstructing CBD stone. Pancreas: Unremarkable. No pancreatic ductal dilatation or surrounding inflammatory changes. Spleen: Normal in size without focal abnormality. Adrenals/Urinary Tract: Adrenal glands appear normal. Kidneys are unremarkable without mass, stone or hydronephrosis. Bladder is unremarkable, partially decompressed. Stomach/Bowel: No dilated large or small bowel loops. No evidence of bowel wall inflammation. Appendix is normal. Mild diverticulosis of the sigmoid and descending colon without evidence of acute diverticulitis. Stomach is unremarkable,  partially decompressed. Lymphatic: No enlarged lymph nodes seen in the abdomen or  pelvis. Partially calcified structures within the upper abdomen are suspected to represent partially calcified lymph nodes indicating previous granulomatous infection, or chronic partially calcified blood vessel collateral/varices. Reproductive: Prostate is unremarkable. Other: No abscess collection. No free intraperitoneal air. Musculoskeletal: No acute or suspicious osseous finding. Degenerative changes within the mid and lower lumbar spine, moderate in degree. Review of the MIP images confirms the above findings. IMPRESSION: 1. Common bile duct dilatation, measuring approximately 10 mm diameter. 3 mm calcific density at the level of the distal common bile duct, compatible with obstructing CBD stone. 2. Gallbladder walls appear thickened and there is at least mild pericholecystic edema suggesting an associated acute cholecystitis and/or cholangitis. 3. Relatively diminished contrast enhancement of the LEFT liver lobe, compared to the normal enhancement of the RIGHT hepatic lobe, likely delayed enhancement as there is normal contrast opacification of the LEFT hepatic artery branches, possibly related to shunting given the aforementioned cholecystitis/cholangitis. Recommend Doppler ultrasound of the portal vein to exclude the less likely possibility of portal vein thrombosis as a cause for this asymmetrically diminished enhancement. 4. Mild colonic diverticulosis without evidence of acute diverticulitis. 5. Coronary artery calcifications, particularly dense within the LEFT main and proximal LEFT anterior descending coronary arteries. Recommend correlation with any possible associated cardiac symptoms. 6. Mild cardiomegaly. Aortic Atherosclerosis (ICD10-I70.0). These results, considerations and recommendations were called by telephone at the time of interpretation on 01/08/2018 at 2:45 am to Dr. Darel Hong , who verbally acknowledged these results. Electronically Signed   By: Franki Cabot M.D.   On: 01/08/2018  02:52   US Abdomen Limited RUQ  Result Date: 01/08/2018 CLINICAL DATA:  Right upper quadrant pain. EXAM: ULTRASOUND ABDOMEN LIMITED RIGHT UPPER QUADRANT COMPARISON:  None. FINDINGS: Gallbladder: Mildly thickened gallbladder wall with focal pericholecystic fluid and layering sludge. No discrete stones identified. Murphy's sign is negative. Common bile duct: Diameter: 8.8 mm, mildly dilated.  No filling defects identified. Liver: No focal lesion identified. Within normal limits in parenchymal echogenicity. Portal vein is patent on color Doppler imaging with normal direction of blood flow towards the liver. IMPRESSION: Gallbladder sludge, focal pericholecystic edema, and mild gallbladder wall thickening. Murphy's sign negative. Possibly chronic inflammation or stasis. Mild extrahepatic bile duct dilatation. No cause identified. Correlate with liver function studies. Electronically Signed   By: Lucienne Capers M.D.   On: 01/08/2018 03:29    No results found.  No results found.    Assessment and Plan: Patient Active Problem List   Diagnosis Date Noted  . Need for shingles vaccine 01/22/2019  . Need for vaccination against Streptococcus pneumoniae using pneumococcal conjugate vaccine 7 01/22/2019  . Encounter for general adult medical examination with abnormal findings 01/18/2018  . Abnormal liver function 01/18/2018  . S/P laparoscopic cholecystectomy 01/18/2018  . Bradycardia 01/18/2018  . Cutaneous candidiasis 01/18/2018  . Oral candidiasis 01/18/2018  . Septic shock ([place]) 01/08/2018  . Insect bite of right lower leg 09/25/2017  . Atopic dermatitis 09/25/2017  . PAD (peripheral artery disease) ([place]) 11/24/2015  . Type 2 diabetes mellitus with hyperglycemia, without long-term current use of insulin ([place]) 11/24/2015  . Hyperlipidemia 11/24/2015  . Essential hypertension 11/24/2015  . PVD (peripheral vascular disease) ([place]) 10/29/2015    1. OSA on CPAP New cpap ordered for patient.  -  For home use only DME continuous positive airway pressure (CPAP)  2. Essential hypertension Controlled, continue to follow up with pcp for surveillance.  3. Morbid obesity (HCC) Obesity Counseling: Risk Assessment: An assessment of behavioral risk factors was made today and includes lack of exercise sedentary lifestyle, lack of portion control and poor dietary habits.  Risk Modification Advice: She was counseled on portion control guidelines. Restricting daily caloric intake to 1800. The detrimental long term effects of obesity on her health and ongoing poor compliance was also discussed with the patient.    General Counseling: I have discussed the findings of the evaluation and examination with Effie Shy.  I have also discussed any further diagnostic evaluation thatmay be needed or ordered today. Lemon verbalizes understanding of the findings of todays visit. We also reviewed his medications today and discussed drug interactions and side effects including but not limited excessive drowsiness and altered mental states. We also discussed that there is always a risk not just to him but also people around him. he has been encouraged to call the office with any questions or concerns that should arise related to todays visit.  No orders of the defined types were placed in this encounter.    Time spent: 25 minutes  I have personally obtained a history, examined the patient, evaluated laboratory and imaging results, formulated the assessment and plan and placed orders.    Allyne Gee, MD [facility] Pulmonary and Critical Care Sleep medicine

## 2019-03-25 IMAGING — CR DG CHEST 2V
1 series · 2 of 2 positions shown · non-contrast
Comparison: 07/06/2015

CLINICAL DATA: Fever yesterday. Vomiting today. Dizziness and chest
pain.

EXAM:
CHEST - 2 VIEW

[Series 1: dg chest 2 view · 0.14mm/px · 2 of 2 slices shown]
[im 1/2]
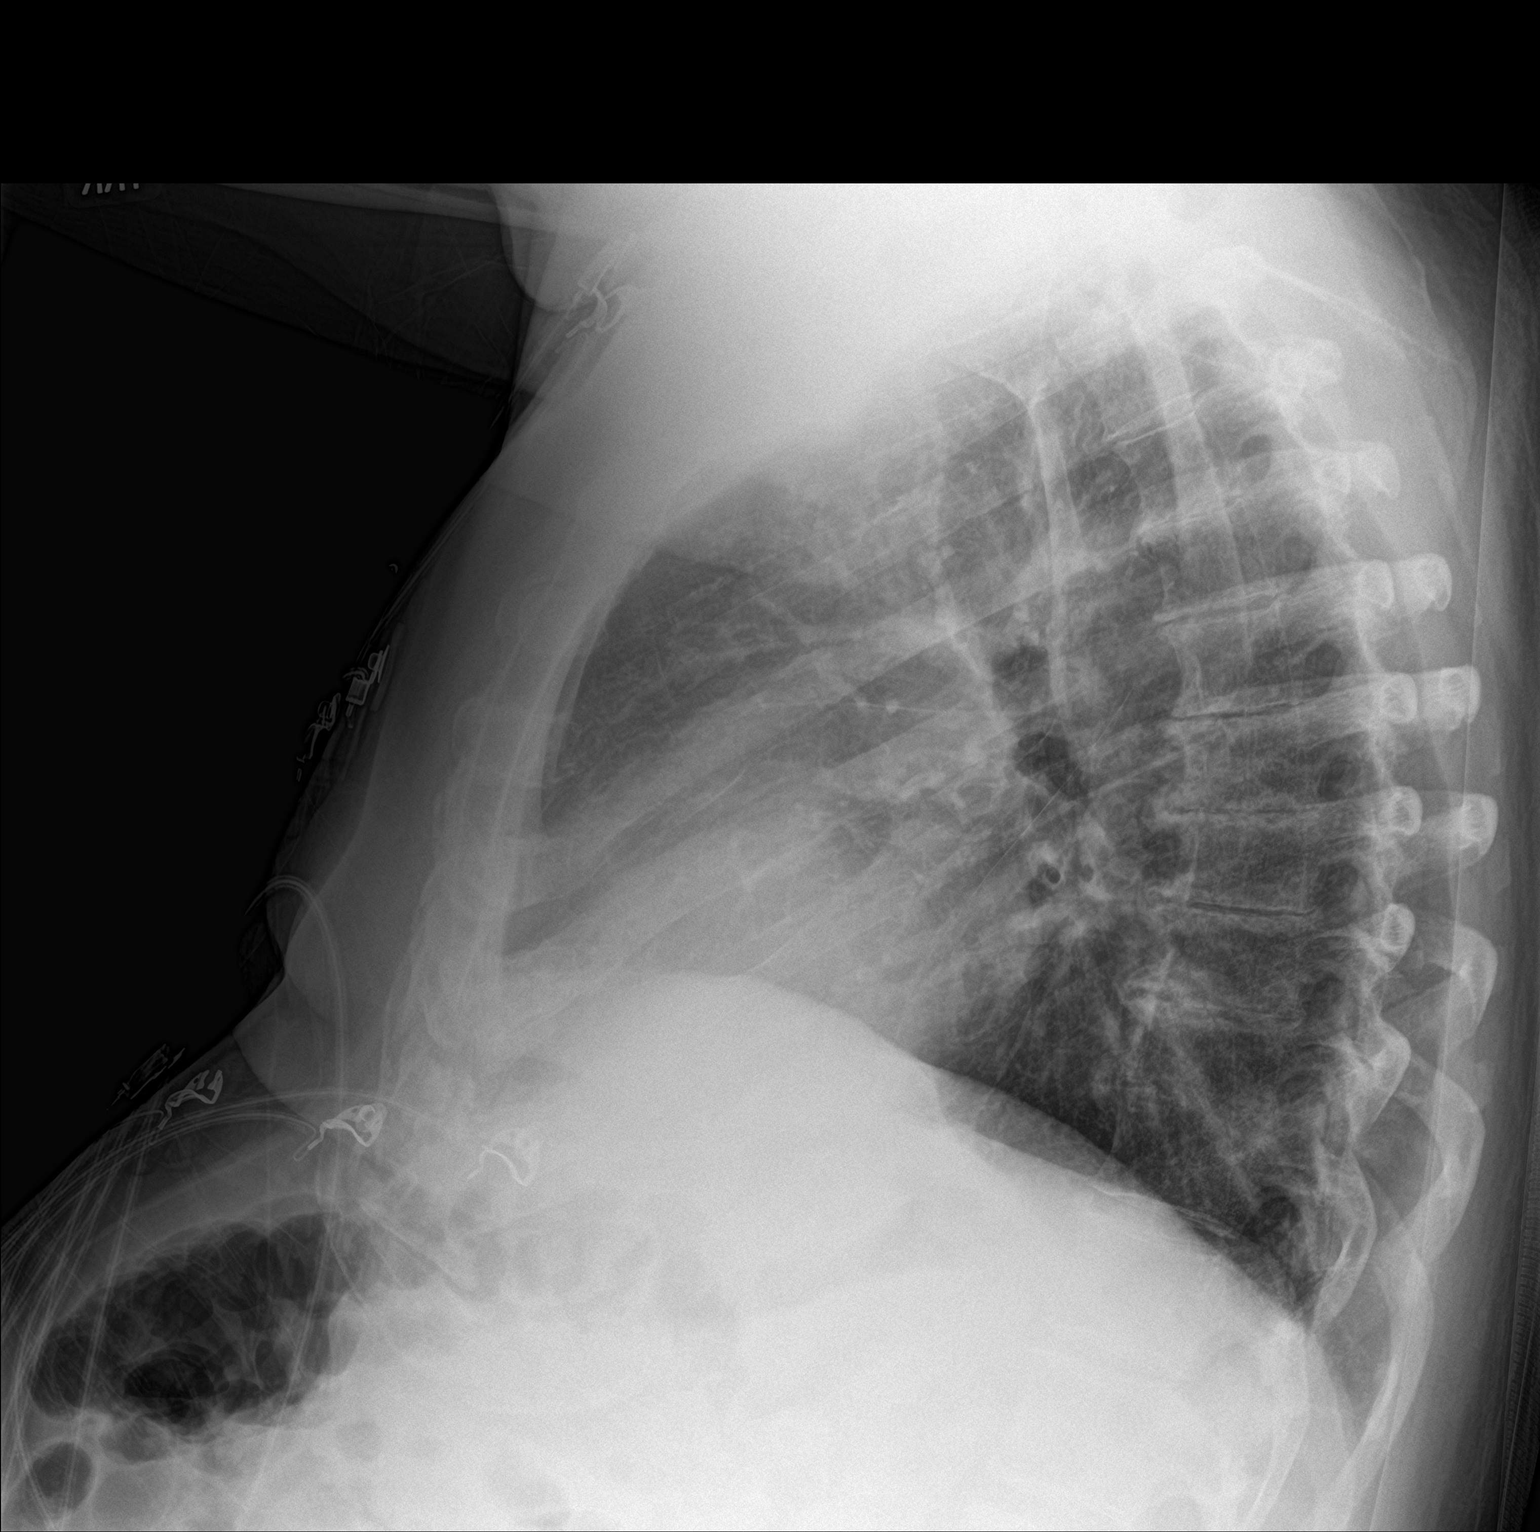
[im 2/2]
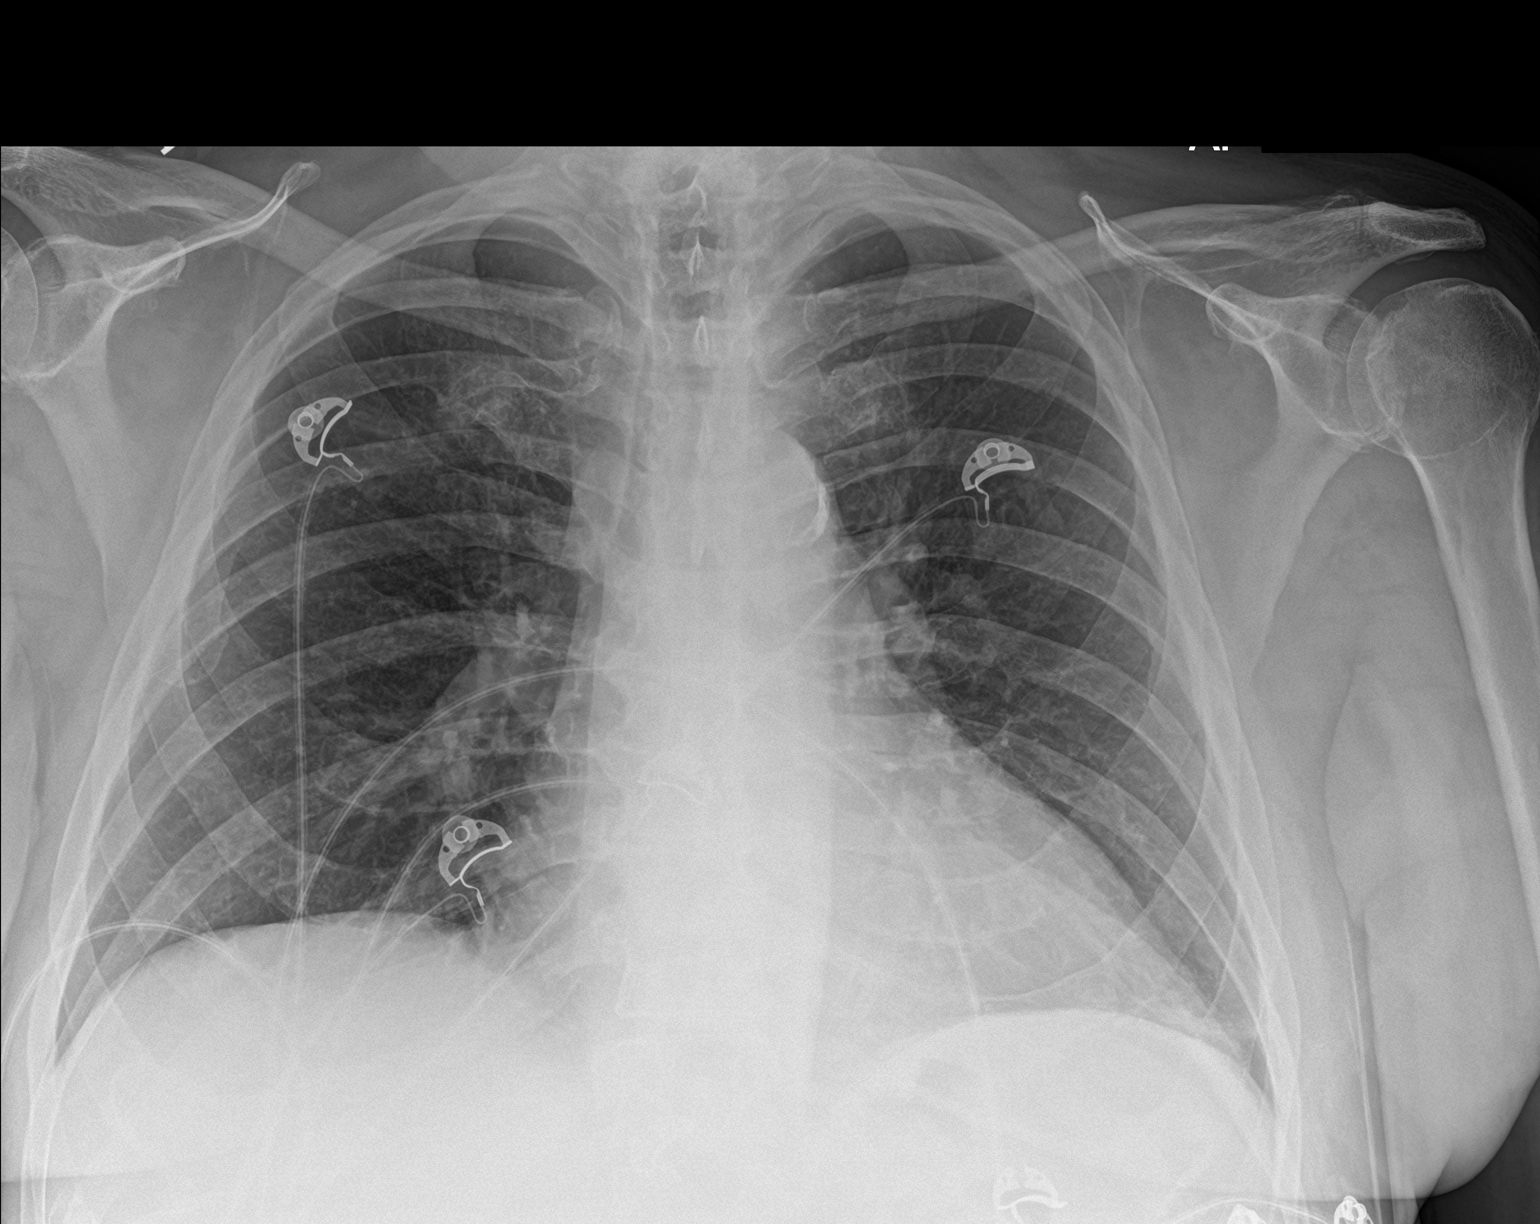

[2 of 2 positions shown; findings below may reference images not displayed]

FINDINGS: Normal heart size and pulmonary vascularity. No focal airspace
disease or consolidation in the lungs. No blunting of costophrenic
angles. No pneumothorax. Mediastinal contours appear intact.
Calcification of the aorta. Degenerative changes in the spine and
shoulders.
IMPRESSION: No active cardiopulmonary disease.

## 2019-03-25 IMAGING — US US ABDOMEN LIMITED
1 series · 14 of 25 positions shown · non-contrast
Comparison: None.

CLINICAL DATA: Right upper quadrant pain.

EXAM:
ULTRASOUND ABDOMEN LIMITED RIGHT UPPER QUADRANT

[Series 1: us abdomen limited · 14 of 42 slices shown]
[im 1/42]
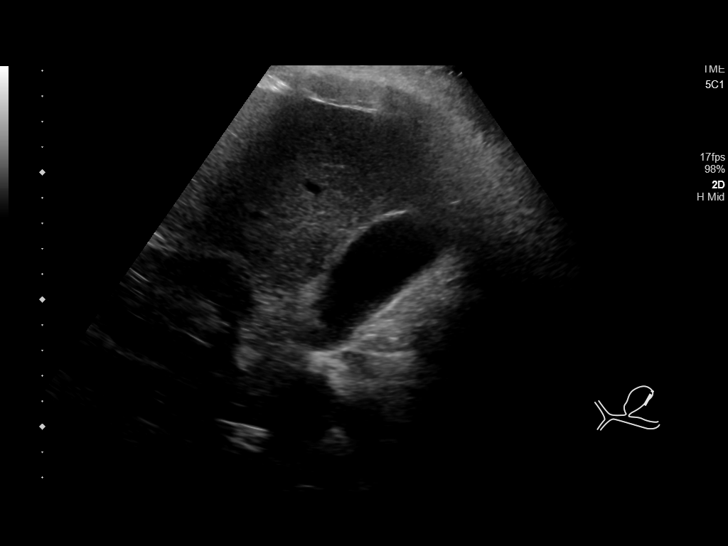
[im 4/42]
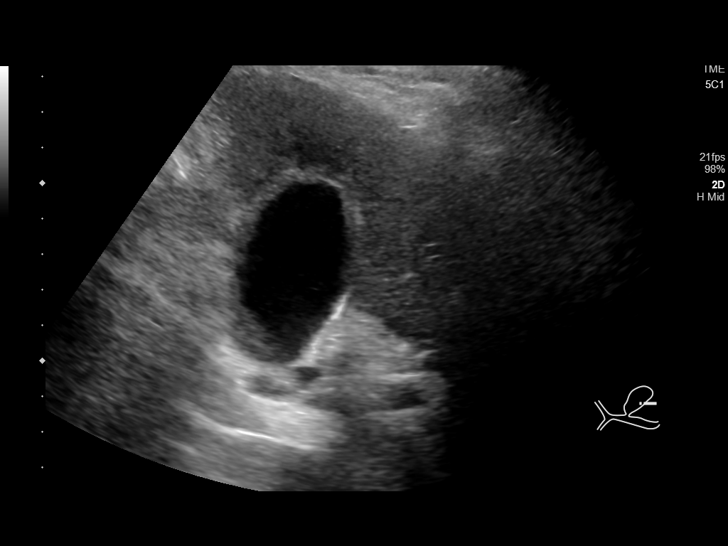
[im 7/42]
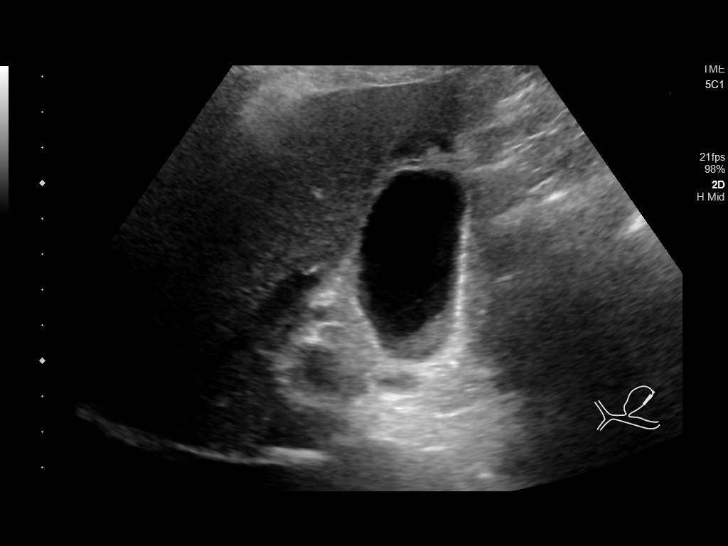
[im 11/42]
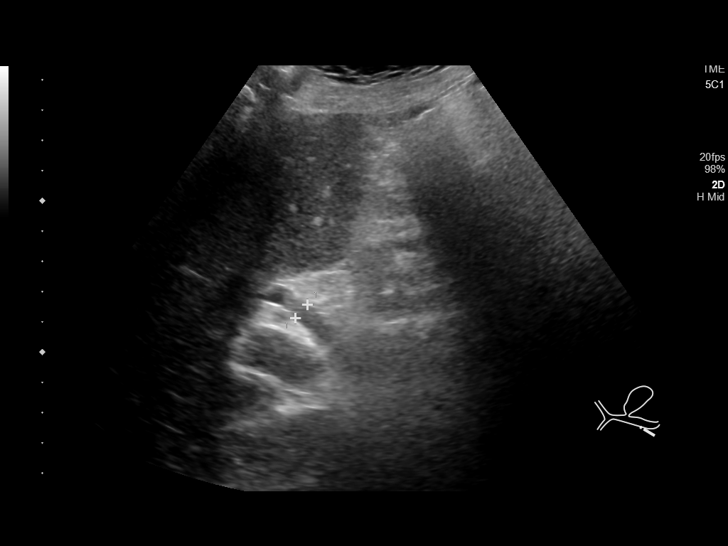
[im 14/42]
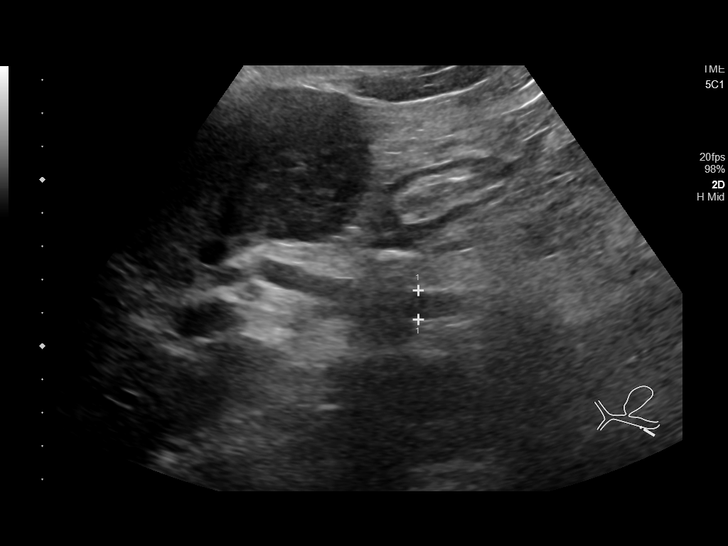
[im 16/42]
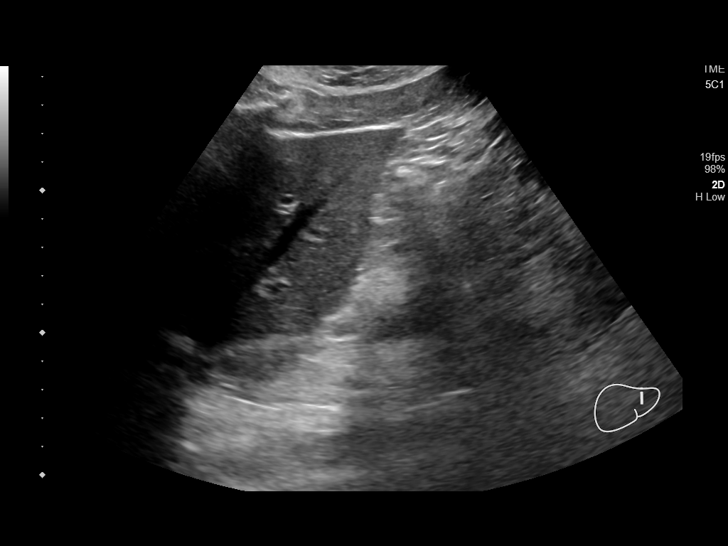
[im 19/42]
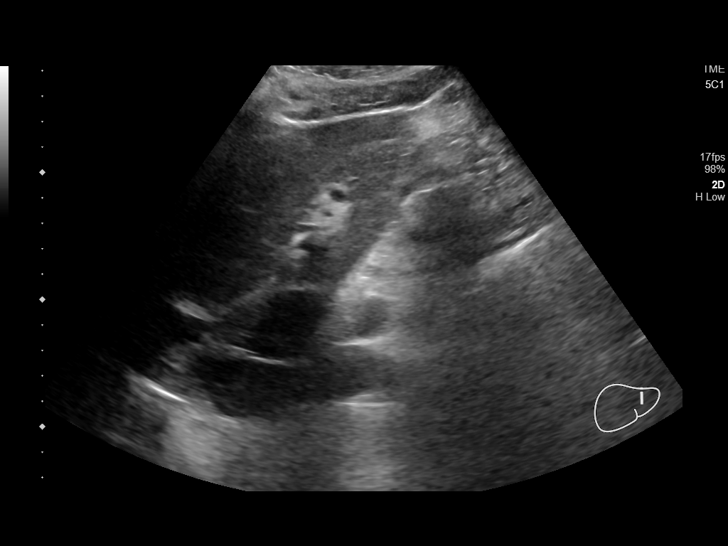
[im 23/42]
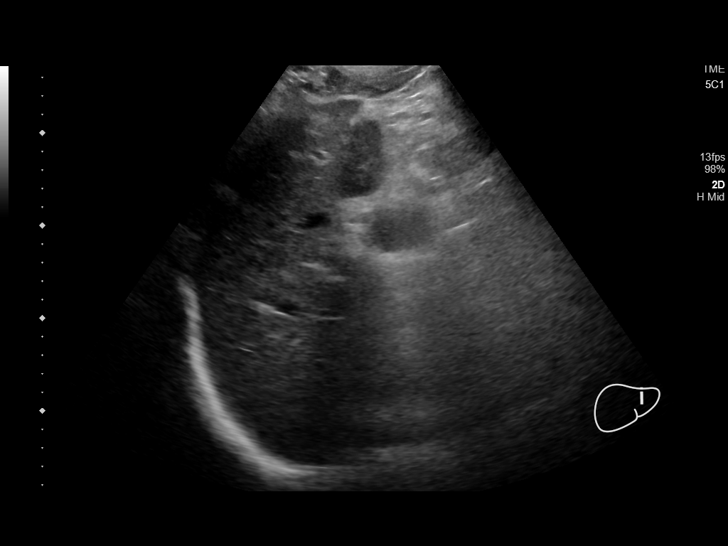
[im 26/42]
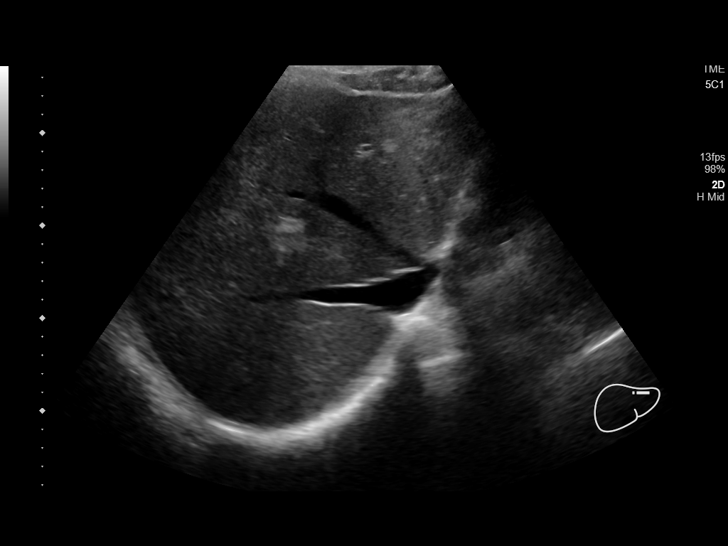
[im 28/42]
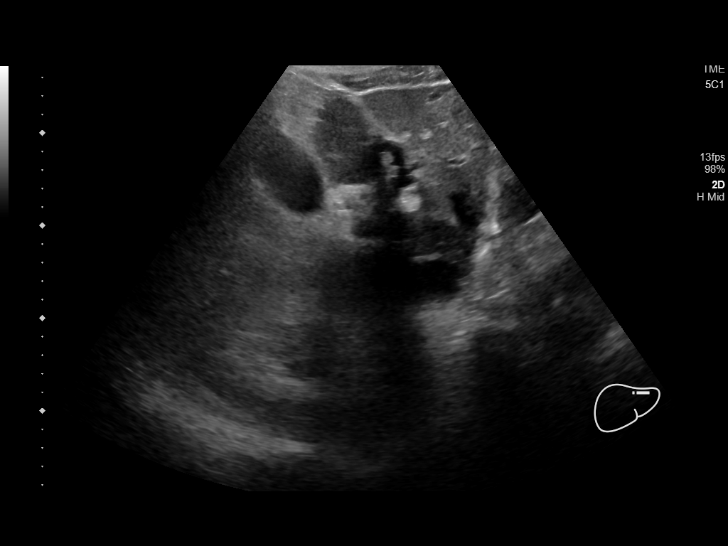
[im 31/42]
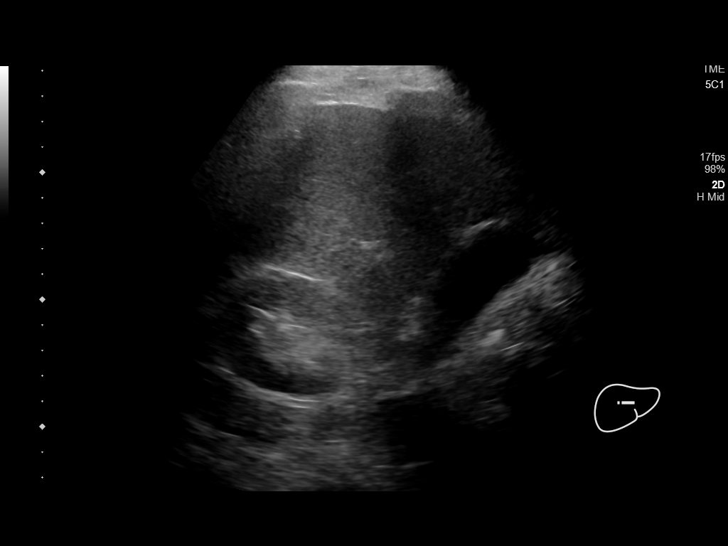
[im 35/42]
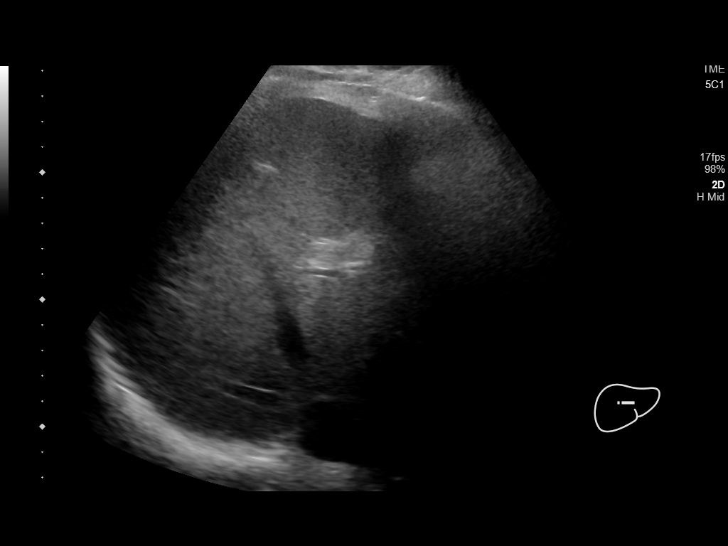
[im 38/42]
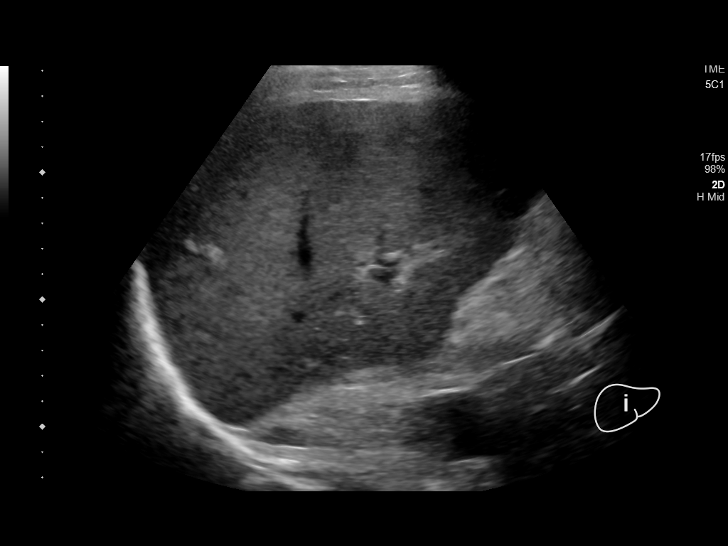
[im 42/42]
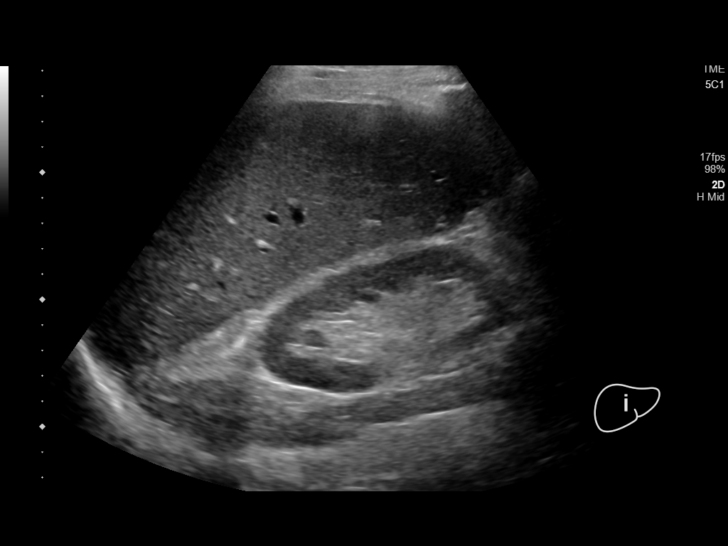

[14 of 25 positions shown; findings below may reference images not displayed]

FINDINGS: Gallbladder:

Mildly thickened gallbladder wall with focal pericholecystic fluid
and layering sludge. No discrete stones identified. Murphy's sign is
negative.

Common bile duct:

Diameter: 8.8 mm, mildly dilated.  No filling defects identified.

Liver:

No focal lesion identified. Within normal limits in parenchymal
echogenicity. Portal vein is patent on color Doppler imaging with
normal direction of blood flow towards the liver.
IMPRESSION: Gallbladder sludge, focal pericholecystic edema, and mild
gallbladder wall thickening. Murphy's sign negative. Possibly
chronic inflammation or stasis. Mild extrahepatic bile duct
dilatation. No cause identified. Correlate with liver function
studies.

## 2019-03-25 IMAGING — CT CT ANGIO CHEST-ABD-PELV FOR DISSECTION W/ AND WO/W CM
2 of 7 series · 12 of 46 positions shown, 14 images · IV contrast (APPLIED)
Comparison: None.

CLINICAL DATA: Chest pain, upper abdominal pain with nausea and
vomiting. Fever.

EXAM:
CT ANGIOGRAPHY CHEST, ABDOMEN AND PELVIS
TECHNIQUE: Multidetector CT imaging through the chest, abdomen and pelvis was
performed using the standard protocol during bolus administration of
intravenous contrast. Multiplanar reconstructed images and MIPs were
obtained and reviewed to evaluate the vascular anatomy.
CONTRAST:  100mL [id number] IOPAMIDOL ([id number]) INJECTION 76%

[Series 5: axial arterial · axial · arterial · 0.86mm/px · z∈[-158,+406]mm · 9 of 220 slices shown, 11 images]
[im 16/220  soft-tissue]
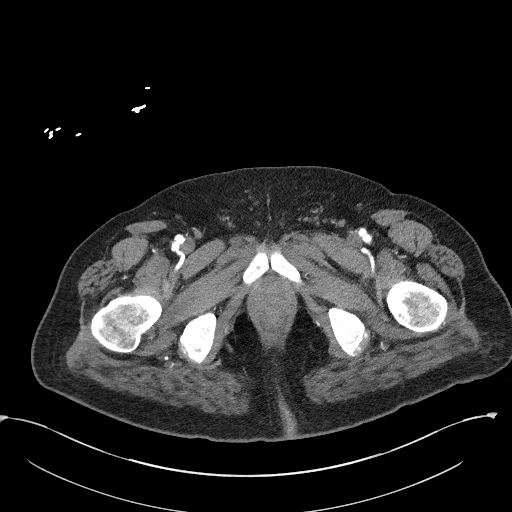
[im 16/220  bone]
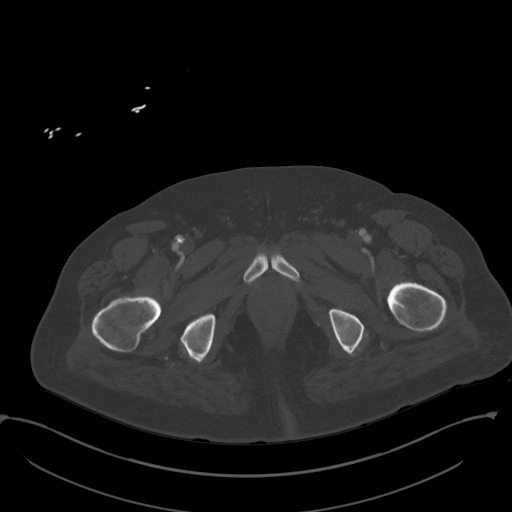
[im 47/220  soft-tissue]
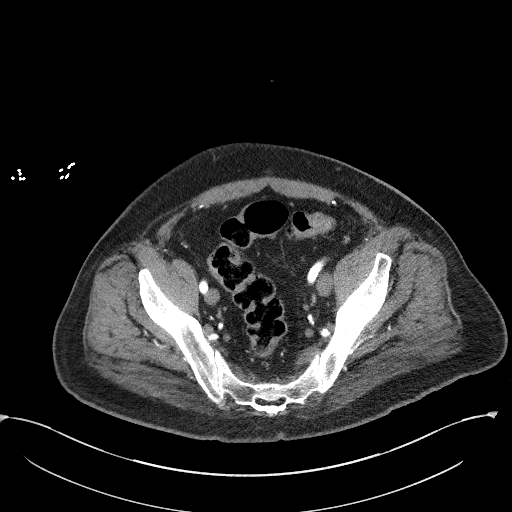
[im 63/220  soft-tissue]
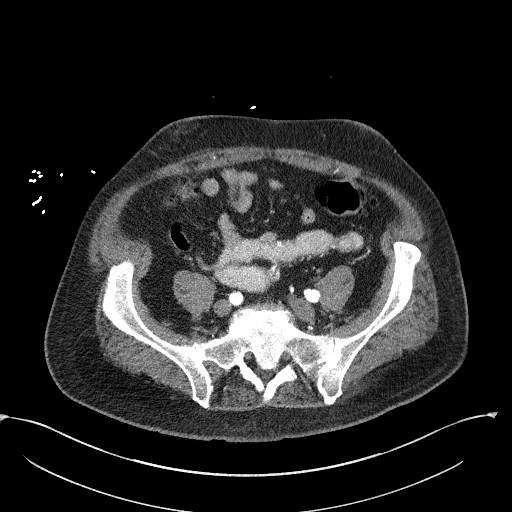
[im 94/220  soft-tissue]
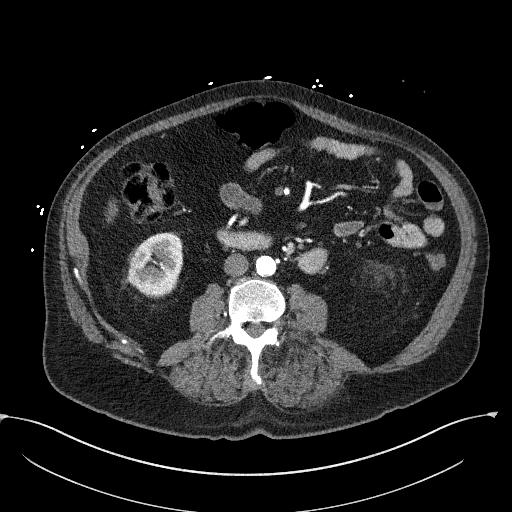
[im 110/220  soft-tissue]
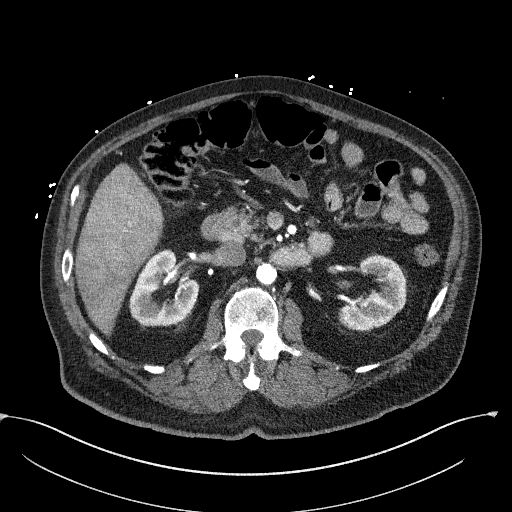
[im 126/220  soft-tissue]
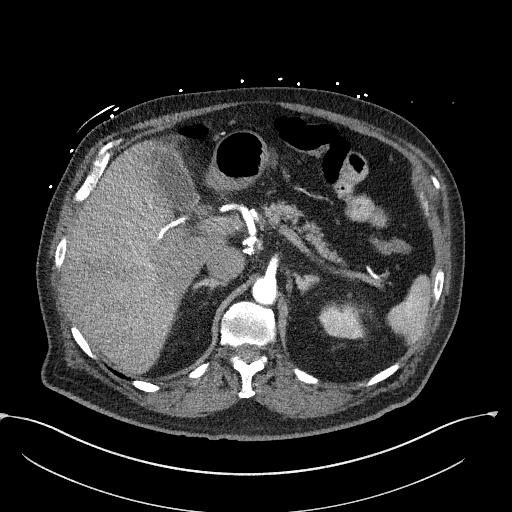
[im 157/220  soft-tissue]
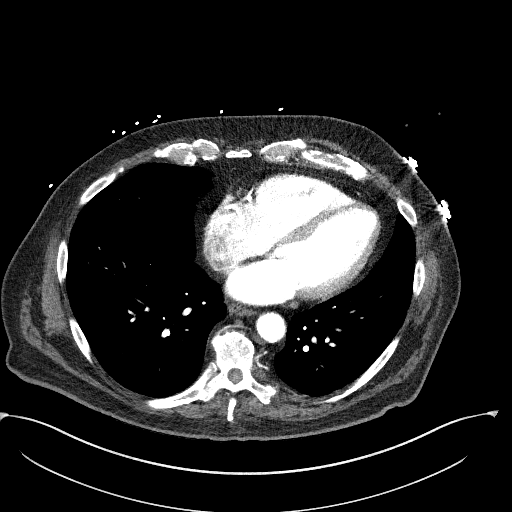
[im 173/220  soft-tissue]
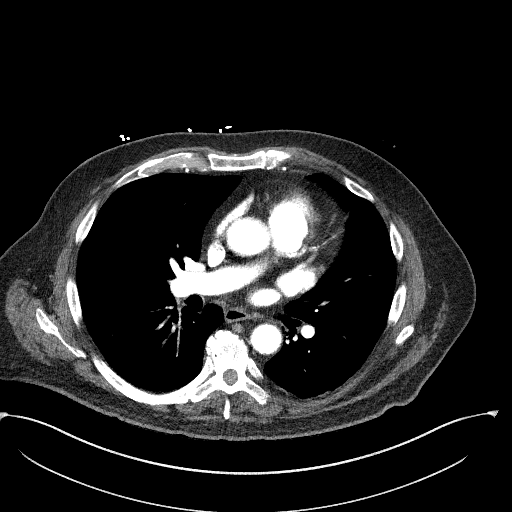
[im 204/220  soft-tissue]
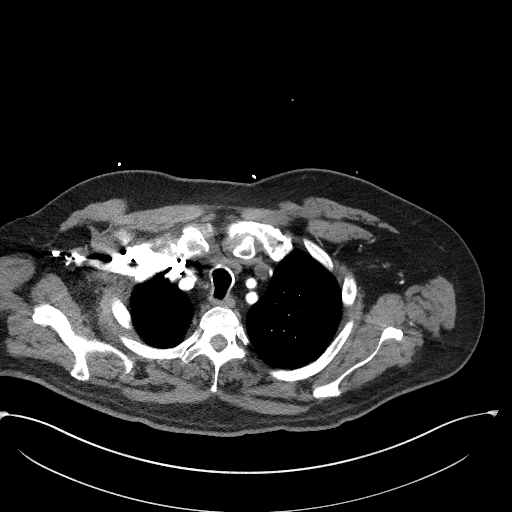
[im 204/220  bone]
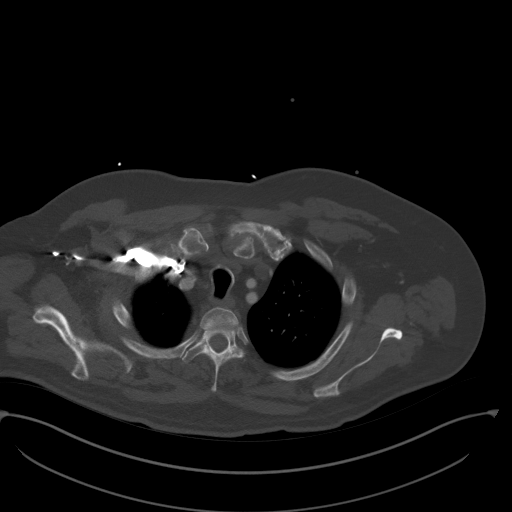

[Series 7: coronals · coronal · 0.89mm/px · 3 of 155 slices shown]
[im 39/155  soft-tissue]
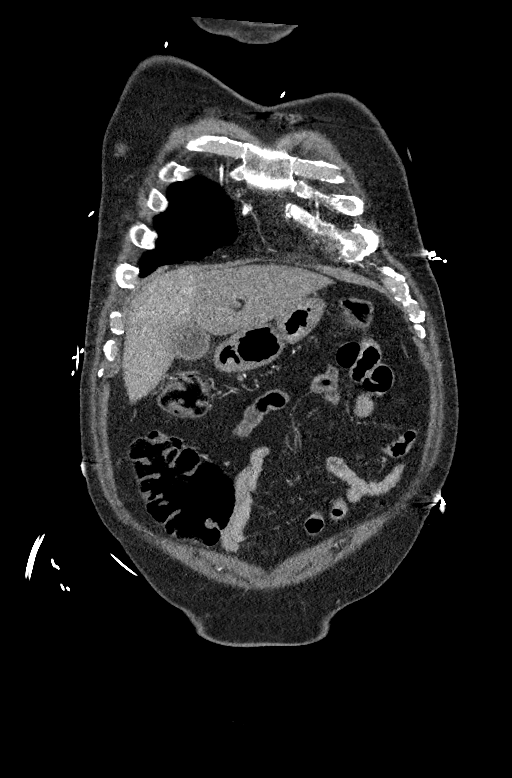
[im 78/155  soft-tissue]
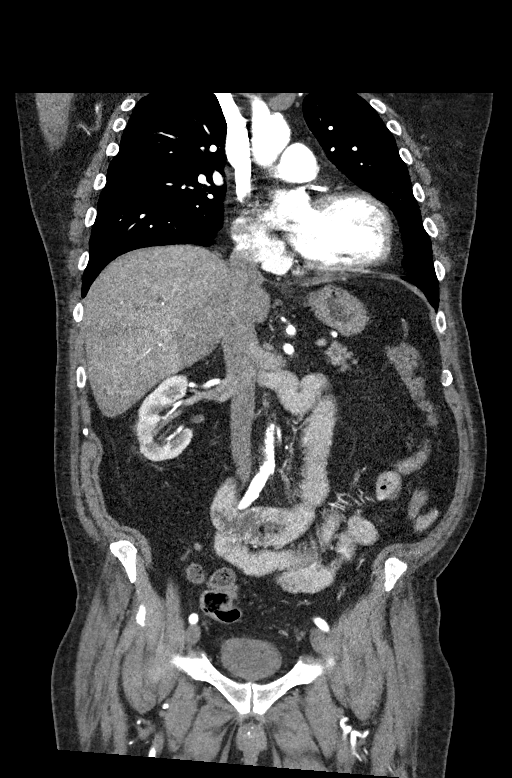
[im 116/155  soft-tissue]
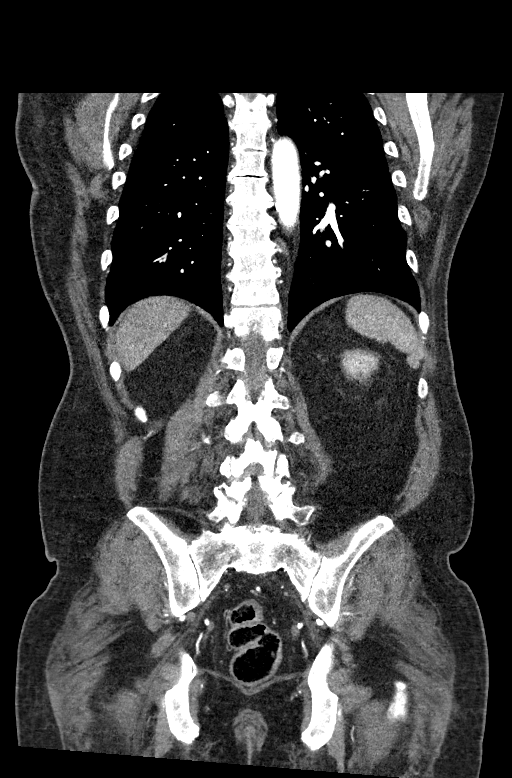

[12 of 46 positions shown; findings below may reference images not displayed]

FINDINGS: CTA CHEST FINDINGS

Cardiovascular: No thoracic aortic aneurysm or thoracic aortic
dissection. Scattered aortic atherosclerosis. Mild cardiomegaly.
Three-vessel coronary artery calcifications. No pericardial
effusion. No pulmonary embolism identified.

Mediastinum/Nodes: No mass or enlarged lymph nodes seen within the
mediastinum or perihilar regions. Esophagus appears normal. Trachea
and central bronchi are unremarkable.

Lungs/Pleura: Lungs are clear. No pleural effusion or pneumothorax.

Musculoskeletal: No acute or suspicious osseous finding.
Degenerative spondylosis throughout the thoracic spine, mild to
moderate in degree.

Review of the MIP images confirms the above findings.

CTA ABDOMEN AND PELVIS FINDINGS

VASCULAR

Aorta: Normal caliber aorta without aneurysm, dissection, vasculitis
or significant stenosis. Fairly extensive aortic atherosclerosis.

Celiac: Celiac artery trunk is patent without evidence of aneurysm,
dissection, vasculitis or significant stenosis. Normal contrast flow
is seen to the RIGHT and LEFT hepatic arteries, although perhaps
slightly diminished to the LEFT liver lobe. Normal contrast flow is
seen to the splenic artery.

SMA: Patent without evidence of aneurysm, dissection, vasculitis or
significant stenosis.

Renals: Both renal arteries are patent without evidence of aneurysm,
dissection, vasculitis, fibromuscular dysplasia or significant
stenosis.

IMA: Patent without evidence of aneurysm, dissection, vasculitis or
significant stenosis.

Inflow: Patent without evidence of aneurysm, dissection, vasculitis
or significant stenosis.

Veins: No obvious venous abnormality within the limitations of this
arterial phase study. However, the main portal vein is prominent,
measuring 1.9 cm diameter.

Review of the MIP images confirms the above findings.

NON-VASCULAR

Hepatobiliary: Relatively diminished contrast enhancement of the
LEFT liver lobe, compared to the RIGHT hepatic lobe. No focal mass
or lesion within either liver lobe.

Gallbladder walls appear thickened and there is at least mild
pericholecystic edema suggesting acute cholecystitis. Common bile
duct dilatation. 3 mm calcific density at the level of the distal
common bile duct, highly suspicious for obstructing CBD stone.

Pancreas: Unremarkable. No pancreatic ductal dilatation or
surrounding inflammatory changes.

Spleen: Normal in size without focal abnormality.

Adrenals/Urinary Tract: Adrenal glands appear normal. Kidneys are
unremarkable without mass, stone or hydronephrosis. Bladder is
unremarkable, partially decompressed.

Stomach/Bowel: No dilated large or small bowel loops. No evidence of
bowel wall inflammation. Appendix is normal. Mild diverticulosis of
the sigmoid and descending colon without evidence of acute
diverticulitis. Stomach is unremarkable, partially decompressed.

Lymphatic: No enlarged lymph nodes seen in the abdomen or pelvis.
Partially calcified structures within the upper abdomen are
suspected to represent partially calcified lymph nodes indicating
previous granulomatous infection, or chronic partially calcified
blood vessel collateral/varices.

Reproductive: Prostate is unremarkable.

Other: No abscess collection. No free intraperitoneal air.

Musculoskeletal: No acute or suspicious osseous finding.
Degenerative changes within the mid and lower lumbar spine, moderate
in degree.

Review of the MIP images confirms the above findings.
IMPRESSION: 1. Common bile duct dilatation, measuring approximately 10 mm
diameter. 3 mm calcific density at the level of the distal common
bile duct, compatible with obstructing CBD stone.
2. Gallbladder walls appear thickened and there is at least mild
pericholecystic edema suggesting an associated acute cholecystitis
and/or cholangitis.
3. Relatively diminished contrast enhancement of the LEFT liver
lobe, compared to the normal enhancement of the RIGHT hepatic lobe,
likely delayed enhancement as there is normal contrast opacification
of the LEFT hepatic artery branches, possibly related to shunting
given the aforementioned cholecystitis/cholangitis. Recommend
Doppler ultrasound of the portal vein to exclude the less likely
possibility of portal vein thrombosis as a cause for this
asymmetrically diminished enhancement.
4. Mild colonic diverticulosis without evidence of acute
diverticulitis.
5. Coronary artery calcifications, particularly dense within the
LEFT main and proximal LEFT anterior descending coronary arteries.
Recommend correlation with any possible associated cardiac symptoms.
6. Mild cardiomegaly.

Aortic Atherosclerosis ([id number]-B20.0).

These results, considerations and recommendations were called by
telephone at the time of interpretation on 01/08/2018 at [DATE] to
Dr. MIQI QURBONOV , who verbally acknowledged these results.

## 2019-04-18 ENCOUNTER — Other Ambulatory Visit: Payer: Self-pay

## 2019-04-18 ENCOUNTER — Ambulatory Visit (INDEPENDENT_AMBULATORY_CARE_PROVIDER_SITE_OTHER): Payer: Medicare Other

## 2019-04-18 DIAGNOSIS — G4733 Obstructive sleep apnea (adult) (pediatric): Secondary | ICD-10-CM

## 2019-04-18 NOTE — Progress Notes (Signed)
95 percentile pressure 9   95th percentile leak 25.0   apnea index 0.4 /hr  apnea-hypopnea index  1.1 /hr   total days used  >4 hr 50 days  total days used <4 hr 1 days  Total compliance 98 percent  He is doing great. No problems or questions at this time

## 2019-04-19 ENCOUNTER — Ambulatory Visit (INDEPENDENT_AMBULATORY_CARE_PROVIDER_SITE_OTHER): Payer: Medicare Other | Admitting: Nurse Practitioner

## 2019-04-19 ENCOUNTER — Encounter: Payer: Self-pay | Admitting: Nurse Practitioner

## 2019-04-19 VITALS — BP 134/60 | HR 64 | Ht 68.0 in | Wt 224.2 lb

## 2019-04-19 DIAGNOSIS — E785 Hyperlipidemia, unspecified: Secondary | ICD-10-CM

## 2019-04-19 DIAGNOSIS — I2510 Atherosclerotic heart disease of native coronary artery without angina pectoris: Secondary | ICD-10-CM

## 2019-04-19 DIAGNOSIS — I10 Essential (primary) hypertension: Secondary | ICD-10-CM

## 2019-04-19 NOTE — Patient Instructions (Signed)
Medication Instructions:  No changes  *If you need a refill on your cardiac medications before your next appointment, please call your pharmacy*   Lab Work: None ordered  If you have labs (blood work) drawn today and your tests are completely normal, you will receive your results only by: Marland Kitchen MyChart Message (if you have MyChart) OR . A paper copy in the mail If you have any lab test that is abnormal or we need to change your treatment, we will call you to review the results.   Testing/Procedures: None ordered    Follow-Up: At [facility], you and your health needs are our priority.  As part of our continuing mission to provide you with exceptional heart care, we have created designated Provider Care Teams.  These Care Teams include your primary Cardiologist (physician) and Advanced Practice Providers (APPs -  Physician Assistants and Nurse Practitioners) who all work together to provide you with the care you need, when you need it.  We recommend signing up for the patient portal called "MyChart".  Sign up information is provided on this After Visit Summary.  MyChart is used to connect with patients for Virtual Visits (Telemedicine).  Patients are able to view lab/test results, encounter notes, upcoming appointments, etc.  Non-urgent messages can be sent to your provider as well.   To learn more about what you can do with MyChart, go to [email or web address].    Your next appointment:   1 year(s)  The format for your next appointment:   In Person  Provider:    Please see [name], MD

## 2019-04-19 NOTE — Progress Notes (Signed)
Office Visit    Patient Name: Allen [name] Date of Encounter: 04/19/2019  Primary Care Provider:  Ronnell Freshwater, NP Primary Cardiologist:  [name], MD  Chief Complaint    79 y/o ? w/ a h/o CAD status post MI in 1989, hypertension, hyperlipidemia, diabetes mellitus, obesity, and sleep apnea, who presents for CAD follow-up.  Past Medical History    Past Medical History:  Diagnosis Date  . Coronary artery disease   . Diabetes mellitus without complication ([place])   . Heart disease   . Hypertension   . MI (myocardial infarction) ([place])   . Sleep apnea    Past Surgical History:  Procedure Laterality Date  . CARDIAC CATHETERIZATION  1989  . CARDIAC CATHETERIZATION  1990  . GALLBLADDER SURGERY    . LEG SURGERY Right    stent placement    Allergies  Allergies  Allergen Reactions  . Iodinated Diagnostic Agents Anaphylaxis and Rash    TOLERATED CT CONTRAST ON 01/07/2018 WITH NO ISSUES AFTER 10 MINUTE PRETREATMENT X-Ray contrast dyes- anaphylaxis & rash X-Ray contrast dyes- anaphylaxis & rash   . Iohexol Hives and Shortness Of Breath    Pt states that he had IV dye for a kidney x-ray years ago and after injection of the dye he had hives all over his body, throat swelling, and SOB. Pt states that he had IV dye for a kidney x-ray years ago and after injection of the dye he had hives all over his body, throat swelling, and SOB. Pt states that he had IV dye for a kidney x-ray years ago and after injection of the dye he had hives all over his body, throat swelling, and SOB.  . Duloxetine Other (See Comments)    Sick to stomach and chills  . Dye Fdc Red [Red Dye] Hives  . Fluticasone Other (See Comments)    Caused nosebleeds  . Gabapentin Other (See Comments)  . Penicillins Other (See Comments)    History of Present Illness    79 year old male with the above past medical history including coronary artery disease status post myocardial infarction 1989.  He was treated  with angioplasty at [facility] but then had repeat catheterization approximately 6 months later and showed occluded vessel with collaterals.  He has been managed medically since and has not had any ischemic cardiac events.  Other history includes diabetes mellitus, hypertension, hyperlipidemia, obesity, sleep apnea on CPAP, and peripheral arterial disease.  During hospitalization for sepsis and cholecystitis requiring cholecystectomy in late 2019, he was noted to have nocturnal bradycardia with a rate as low as 39.  His metoprolol dose was reduced during the hospitalization and he had a heart rate of 61 at his last visit.  It was felt the nocturnal bradycardia was most likely secondary to underlying sleep apnea.  He was last seen in cardiology clinic in January 2020, at which time he was doing well.  Over the past year, he has continued to do well from a cardiovascular standpoint.  He is more sedentary in size he and his wife have mostly stayed at home in the setting of the COVID-19 pandemic.  They have both been vaccinated and are looking forward to finally being able to go to the grocery store and walk around with their masks on.  He has not had any chest pain and denies dyspnea, palpitations, PND, orthopnea, dizziness, syncope, edema, or early satiety.  Home Medications    Prior to Admission medications   Medication Sig Start  Date End Date Taking? Authorizing Provider  aspirin 81 MG chewable tablet Chew by mouth daily.    [provider]  b complex vitamins tablet Take 1 tablet by mouth daily.    [provider]  B-D ULTRA-FINE 33 LANCETS MISC Use 1 Lancet 3 (three) times daily. 05/21/15   [provider]  cetirizine (ZYRTEC) 10 MG tablet Take 10 mg by mouth daily.    [provider]  Cholecalciferol (VITAMIN D3) 1000 units CAPS Take by mouth.    [provider]  clotrimazole-betamethasone (LOTRISONE) cream Apply 1 application topically 2 (two) times daily.  12/26/18   Ronnell Freshwater, NP  Continuous Blood Gluc Sensor ([place]) MISC Use 3 each every 10 (ten) days. 06/17/16   [provider]  enalapril (VASOTEC) 20 MG tablet Take 1 tablet (20 mg total) by mouth daily. 01/08/19   Ronnell Freshwater, NP  ezetimibe-simvastatin (VYTORIN) 10-40 MG tablet Take 1 tablet by mouth daily. 11/28/18   Ronnell Freshwater, NP  FLUZONE HIGH-DOSE 0.5 ML injection ADM 0.5ML IM UTD 11/30/16   [provider]  folic acid (FOLVITE) 161 MCG tablet Take 400 mcg by mouth daily.    [provider]  Garlic 0960 MG CAPS Take by mouth.    [provider]  glucose blood (ONE TOUCH ULTRA TEST) test strip Check blood sugar 5x a day as directed 07/30/15   [provider]  hydrochlorothiazide (MICROZIDE) 12.5 MG capsule Take 12.5 mg by mouth daily.    [provider]  insulin lispro (HUMALOG) 100 UNIT/ML injection Inject 6-34 Units into the skin 3 (three) times daily before meals.     [provider]  Insulin Pen Needle (BD PEN NEEDLE NANO U/F) 32G X 4 MM MISC USE AS DIRECTED FOUR TIMES A DAY WITH LEVEMIR FLEXTOUCH AND HUMALOG PEN.  DX CODE E11.29 07/26/16   [provider]  Insulin Syringe-Needle U-100 (BD INSULIN SYRINGE U/F) 31G X 5/16" 0.3 ML MISC Inject insulin [place] TID as indicated per sliding scale instuctions. 07/14/17   Ronnell Freshwater, NP  JARDIANCE 25 MG TABS tablet Take 0.5 mg by mouth daily.  07/07/17   [provider]  levothyroxine (SYNTHROID, LEVOTHROID) 50 MCG tablet Take 50 mcg by mouth daily before breakfast.    [provider]  metFORMIN (GLUCOPHAGE) 1000 MG tablet Take 1,000 mg by mouth daily with breakfast.     [provider]  metFORMIN (GLUCOPHAGE) 500 MG tablet Take 1,000 mg by mouth daily with breakfast.    [provider]  metoprolol succinate (TOPROL-XL) 25 MG 24 hr tablet Take 1 tablet (25 mg total) by mouth daily. 01/29/19 02/28/19   Ronnell Freshwater, NP  Multiple Vitamin (MULTIVITAMIN WITH MINERALS) TABS tablet Take 1 tablet by mouth daily.    [provider]  mupirocin ointment (BACTROBAN) 2 % Apply topically two times daily 09/06/17   Ronnell Freshwater, NP  nystatin ointment (MYCOSTATIN) Apply 1 application topically 3 (three) times daily. Patient taking differently: Apply 1 application topically as needed.  01/16/18   Ronnell Freshwater, NP  pioglitazone (ACTOS) 30 MG tablet Take 30 mg by mouth daily.     [provider]  pneumococcal 23 valent vaccine (PNEUMOVAX 23) 25 MCG/0.5ML injection Inject 0.56ml IM once 01/22/19   Ronnell Freshwater, NP  traMADol (ULTRAM) 50 MG tablet Take 100 mg by mouth daily.  11/17/15   [provider]  triamcinolone cream (KENALOG) 0.1 % Apply  1 application topically 2 (two) times daily. 10/17/18   Ronnell Freshwater, NP  vitamin B-12 (CYANOCOBALAMIN) 1000 MCG tablet Take 1,000 mcg by mouth daily.    [provider]  Zoster Vaccine Adjuvanted [facility]) injection Shingles vaccine series - inject IM as directed . 01/22/19   Ronnell Freshwater, NP    Review of Systems    He denies chest pain, palpitations, dyspnea, pnd, orthopnea, n, v, dizziness, syncope, edema, weight gain, or early satiety.  All other systems reviewed and are otherwise negative except as noted above.  Physical Exam    VS:  BP 134/60 (BP Location: Left Arm, Patient Position: Sitting, Cuff Size: Normal)   Pulse 64   Ht 5\' 8"  (1.727 m)   Wt 224 lb 4 oz (101.7 kg)   SpO2 97%   BMI 34.10 kg/m  , BMI Body mass index is 34.1 kg/m. GEN: Obese, in no acute distress. HEENT: normal. Neck: Supple, obese, difficult to gauge JVP.  No carotid bruits, or masses. Cardiac: RRR, no murmurs, rubs, or gallops. No clubbing, cyanosis.  Trace left ankle edema.  Radials/PT 1+ and equal bilaterally.  Respiratory:  Respirations regular and unlabored, clear to auscultation bilaterally. GI: Obese, soft, nontender,  nondistended, BS + x 4. MS: no deformity or atrophy. Skin: warm and dry, no rash. Neuro:  Strength and sensation are intact. Psych: Normal affect.  Accessory Clinical Findings    ECG personally reviewed by me today -regular sinus rhythm, 64, first-degree AV block, prior inferior infarct, poor R wave progression - no acute changes.  Lab Results  Component Value Date   WBC 4.9 12/27/2018   HGB 13.6 12/27/2018   HCT 42.1 12/27/2018   MCV 100 (H) 12/27/2018   PLT 259 12/27/2018   Lab Results  Component Value Date   CREATININE 1.19 12/27/2018   BUN 23 12/27/2018   NA 137 12/27/2018   K 4.9 12/27/2018   CL 99 12/27/2018   CO2 23 12/27/2018   Lab Results  Component Value Date   ALT 23 12/27/2018   AST 22 12/27/2018   ALKPHOS 71 12/27/2018   BILITOT 0.7 12/27/2018   Lab Results  Component Value Date   CHOL 128 12/27/2018   HDL 80 12/27/2018   LDLCALC 35 12/27/2018   TRIG 61 12/27/2018   CHOLHDL 1.9 12/27/2016    Lab Results  Component Value Date   HGBA1C 6.7 (A) 01/12/2019    Assessment & Plan    1.  Coronary artery disease: Status post remote MI and PTCA in 1989.  Catheterization 6 months later showed occlusion of previously intervened upon vessel with collaterals.  He has been medically managed ever since and continues to do well.  He denies chest pain or dyspnea.  He has been more sedentary over the past year in the setting of COVID-19 and avoiding going out.  Both he and his wife are now vaccinated and they are looking forward to being able to the supermarket with their masks on.  He has gained 16 pounds over the past year and is looking forward to increasing his activity to lose weight.  He remains on aspirin, statin, Zetia, beta-blocker, and ACE inhibitor therapy.  2.  Essential hypertension: Relatively stable at 1 34-60.  He does have a cuff at home but does not routinely check his blood pressure.  I did ask him to check it a few times a week in order to develop a  trend as ideally, we would shoot for a  systolic of less than 996.  3.  Hyperlipidemia: Lipids in November 2020 notable for an LDL of 35.  LFTs within normal limits.  Continue Vytorin therapy.  4.  Peripheral arterial disease: No claudication.  This is followed by vein and vascular and he is due for repeat ABI.  He will follow-up with them.  He remains on aspirin and statin therapy.  5.  Type 2 diabetes mellitus: A1c 6.7 in November.  He remains on insulin, Metformin, and Jardiance.  Followed by primary care.  6.  Disposition: Follow-up in clinic in 1 year or sooner if necessary.   Murray Hodgkins, NP 04/19/2019, 5:11 PM

## 2019-04-24 ENCOUNTER — Telehealth: Payer: Self-pay

## 2019-04-24 NOTE — Telephone Encounter (Signed)
Rescheduled appointment on 04/26/2019 to 05/21/2019. klh

## 2019-04-26 ENCOUNTER — Ambulatory Visit: Payer: Medicare Other | Admitting: Internal Medicine

## 2019-05-07 ENCOUNTER — Other Ambulatory Visit: Payer: Self-pay

## 2019-05-07 MED ORDER — ENALAPRIL MALEATE 20 MG PO TABS: 20.0000 mg | ORAL_TABLET | Freq: Every day | ORAL | 2 refills | Status: DC

## 2019-05-16 ENCOUNTER — Telehealth: Payer: Self-pay

## 2019-05-16 NOTE — Telephone Encounter (Signed)
Confirmed appointment on 05/21/2019 and screened for covid. klh 

## 2019-05-21 ENCOUNTER — Encounter: Payer: Self-pay | Admitting: Internal Medicine

## 2019-05-21 ENCOUNTER — Other Ambulatory Visit: Payer: Self-pay

## 2019-05-21 ENCOUNTER — Ambulatory Visit (INDEPENDENT_AMBULATORY_CARE_PROVIDER_SITE_OTHER): Payer: Medicare Other | Admitting: Internal Medicine

## 2019-05-21 VITALS — BP 127/47 | HR 60 | Temp 97.2°F | Resp 16 | Ht 68.0 in | Wt 215.0 lb

## 2019-05-21 DIAGNOSIS — I10 Essential (primary) hypertension: Secondary | ICD-10-CM

## 2019-05-21 DIAGNOSIS — Z6832 Body mass index (BMI) 32.0-32.9, adult: Secondary | ICD-10-CM

## 2019-05-21 DIAGNOSIS — I2510 Atherosclerotic heart disease of native coronary artery without angina pectoris: Secondary | ICD-10-CM

## 2019-05-21 DIAGNOSIS — Z9989 Dependence on other enabling machines and devices: Secondary | ICD-10-CM

## 2019-05-21 DIAGNOSIS — G4733 Obstructive sleep apnea (adult) (pediatric): Secondary | ICD-10-CM | POA: Diagnosis not present

## 2019-05-21 NOTE — Progress Notes (Signed)
[facility] [place], [place] [address]  Pulmonary Sleep Medicine   Office Visit Note  Patient Name: Allen [name] DOB: 1940-11-01 MRN [id number]  Date of Service: 05/21/2019  Complaints/HPI: Pt is here for pulmonary follow up.  He has a history of OSA. Pt reports good compliance with CPAP therapy. Cleaning machine by hand, and changing filters and tubing as directed. Denies headaches, sinus issues, palpitations, or hemoptysis.  His compliance report shows 98% compliance with an overall ahi of 1.21.  He denies any issues at this time.   ROS  General: (-) fever, (-) chills, (-) night sweats, (-) weakness Skin: (-) rashes, (-) itching,. Eyes: (-) visual changes, (-) redness, (-) itching. Nose and Sinuses: (-) nasal stuffiness or itchiness, (-) postnasal drip, (-) nosebleeds, (-) sinus trouble. Mouth and Throat: (-) sore throat, (-) hoarseness. Neck: (-) swollen glands, (-) enlarged thyroid, (-) neck pain. Respiratory: - cough, (-) bloody sputum, - shortness of breath, - wheezing. Cardiovascular: - ankle swelling, (-) chest pain. Lymphatic: (-) lymph node enlargement. Neurologic: (-) numbness, (-) tingling. Psychiatric: (-) anxiety, (-) depression   Current Medication: Outpatient Encounter Medications as of 05/21/2019  Medication Sig Note  . aspirin 81 MG chewable tablet Chew by mouth daily.   Marland Kitchen b complex vitamins tablet Take 1 tablet by mouth daily.   . B-D ULTRA-FINE 33 LANCETS MISC Use 1 Lancet 3 (three) times daily. 11/24/2015: Received from: [place]  . cetirizine (ZYRTEC) 10 MG tablet Take 10 mg by mouth daily.   . Cholecalciferol (VITAMIN D3) 1000 units CAPS Take by mouth.   . clotrimazole-betamethasone (LOTRISONE) cream Apply 1 application topically 2 (two) times daily.   . Continuous Blood Gluc Sensor (FREESTYLE LIBRE SENSOR SYSTEM) MISC Use 3 each every 10 (ten) days.   . enalapril (VASOTEC) 20 MG tablet Take 1 tablet (20 mg total)  by mouth daily.   Marland Kitchen ezetimibe-simvastatin (VYTORIN) 10-40 MG tablet Take 1 tablet by mouth daily.   Marland Kitchen FLUZONE HIGH-DOSE 0.5 ML injection ADM 0.5ML IM UTD   . folic acid (FOLVITE) 106 MCG tablet Take 400 mcg by mouth daily.   . Garlic 2694 MG CAPS Take by mouth.   Marland Kitchen glucose blood (ONE TOUCH ULTRA TEST) test strip Check blood sugar 5x a day as directed   . hydrochlorothiazide (MICROZIDE) 12.5 MG capsule Take 12.5 mg by mouth daily.   . insulin lispro (HUMALOG) 100 UNIT/ML injection Inject 6-34 Units into the skin 3 (three) times daily before meals.    . Insulin Pen Needle (BD PEN NEEDLE NANO U/F) 32G X 4 MM MISC USE AS DIRECTED FOUR TIMES A DAY WITH LEVEMIR FLEXTOUCH AND HUMALOG PEN.  DX CODE E11.29   . Insulin Syringe-Needle U-100 (BD INSULIN SYRINGE U/F) 31G X 5/16" 0.3 ML MISC Inject insulin [place] TID as indicated per sliding scale instuctions.   Marland Kitchen JARDIANCE 25 MG TABS tablet Take 0.5 mg by mouth daily.    Marland Kitchen levothyroxine (SYNTHROID, LEVOTHROID) 50 MCG tablet Take 50 mcg by mouth daily before breakfast.   . metFORMIN (GLUCOPHAGE) 1000 MG tablet Take 1,000 mg by mouth daily with breakfast.    . metFORMIN (GLUCOPHAGE) 500 MG tablet Take 1,000 mg by mouth daily with breakfast.   . Multiple Vitamin (MULTIVITAMIN WITH MINERALS) TABS tablet Take 1 tablet by mouth daily.   . mupirocin ointment (BACTROBAN) 2 % Apply topically two times daily   . nystatin ointment (MYCOSTATIN) Apply 1 application topically 3 (three) times daily. (Patient  taking differently: Apply 1 application topically as needed. )   . pioglitazone (ACTOS) 30 MG tablet Take 30 mg by mouth daily.    . pneumococcal 23 valent vaccine (PNEUMOVAX 23) 25 MCG/0.5ML injection Inject 0.72ml IM once   . traMADol (ULTRAM) 50 MG tablet Take 100 mg by mouth daily.  11/24/2015: Received from: [place]: 1-2 po bid prn  . triamcinolone cream (KENALOG) 0.1 % Apply 1 application topically 2 (two) times daily.   . vitamin  B-12 (CYANOCOBALAMIN) 1000 MCG tablet Take 1,000 mcg by mouth daily.   Marland Kitchen Zoster Vaccine Adjuvanted [facility]) injection Shingles vaccine series - inject IM as directed .   . metoprolol succinate (TOPROL-XL) 25 MG 24 hr tablet Take 1 tablet (25 mg total) by mouth daily.    No facility-administered encounter medications on file as of 05/21/2019.    Surgical History: Past Surgical History:  Procedure Laterality Date  . CARDIAC CATHETERIZATION  1989  . CARDIAC CATHETERIZATION  1990  . GALLBLADDER SURGERY    . LEG SURGERY Right    stent placement    Medical History: Past Medical History:  Diagnosis Date  . Coronary artery disease   . Diabetes mellitus without complication ([place])   . Heart disease   . Hypertension   . MI (myocardial infarction) ([place])   . Sleep apnea     Family History: Family History  Problem Relation Age of Onset  . Diabetes Mother   . Brain cancer Mother   . Throat cancer Father   . Prostate cancer Brother     Social History: Social History   Socioeconomic History  . Marital status: Married    Spouse name: Not on file  . Number of children: Not on file  . Years of education: Not on file  . Highest education level: Not on file  Occupational History  . Not on file  Tobacco Use  . Smoking status: Former Smoker    Packs/day: 3.00    Years: 32.00    Pack years: 96.00    Types: Cigarettes  . Smokeless tobacco: Never Used  Substance and Sexual Activity  . Alcohol use: No  . Drug use: No  . Sexual activity: Not on file  Other Topics Concern  . Not on file  Social History Narrative  . Not on file   Social Determinants of Health   Financial Resource Strain:   . Difficulty of Paying Living Expenses:   Food Insecurity:   . Worried About Charity fundraiser in the Last Year:   . Arboriculturist in the Last Year:   Transportation Needs:   . Film/video editor (Medical):   Marland Kitchen Lack of Transportation (Non-Medical):   Physical Activity:   . Days  of Exercise per Week:   . Minutes of Exercise per Session:   Stress:   . Feeling of Stress :   Social Connections:   . Frequency of Communication with Friends and Family:   . Frequency of Social Gatherings with Friends and Family:   . Attends Religious Services:   . Active Member of Clubs or Organizations:   . Attends Archivist Meetings:   Marland Kitchen Marital Status:   Intimate Partner Violence:   . Fear of Current or Ex-Partner:   . Emotionally Abused:   Marland Kitchen Physically Abused:   . Sexually Abused:     Vital Signs: Blood pressure (!) 127/47, pulse 60, temperature (!) 97.2 F (36.2 C), resp. rate 16,  height 5\' 8"  (1.727 m), weight 215 lb (97.5 kg), SpO2 100 %.  Examination: General Appearance: The patient is well-developed, well-nourished, and in no distress. Skin: Gross inspection of skin unremarkable. Head: normocephalic, no gross deformities. Eyes: no gross deformities noted. ENT: ears appear grossly normal no exudates. Neck: Supple. No thyromegaly. No LAD. Respiratory: clear bilateraly. Cardiovascular: Normal S1 and S2 without murmur or rub. Extremities: No cyanosis. pulses are equal. Neurologic: Alert and oriented. No involuntary movements.  LABS: No results found for this or any previous visit (from the past 2160 hour(s)).  Radiology: DG Chest 2 View  Result Date: 01/08/2018 CLINICAL DATA:  Fever yesterday. Vomiting today. Dizziness and chest pain. EXAM: CHEST - 2 VIEW COMPARISON:  07/06/2015 FINDINGS: Normal heart size and pulmonary vascularity. No focal airspace disease or consolidation in the lungs. No blunting of costophrenic angles. No pneumothorax. Mediastinal contours appear intact. Calcification of the aorta. Degenerative changes in the spine and shoulders. IMPRESSION: No active cardiopulmonary disease. Electronically Signed   By: Lucienne Capers M.D.   On: 01/08/2018 01:35   CT Angio Chest/Abd/Pel for Dissection W and/or W/WO  Result Date:  01/08/2018 CLINICAL DATA:  Chest pain, upper abdominal pain with nausea and vomiting. Fever. EXAM: CT ANGIOGRAPHY CHEST, ABDOMEN AND PELVIS TECHNIQUE: Multidetector CT imaging through the chest, abdomen and pelvis was performed using the standard protocol during bolus administration of intravenous contrast. Multiplanar reconstructed images and MIPs were obtained and reviewed to evaluate the vascular anatomy. CONTRAST:  116mL ISOVUE-370 IOPAMIDOL (ISOVUE-370) INJECTION 76% COMPARISON:  None. FINDINGS: CTA CHEST FINDINGS Cardiovascular: No thoracic aortic aneurysm or thoracic aortic dissection. Scattered aortic atherosclerosis. Mild cardiomegaly. Three-vessel coronary artery calcifications. No pericardial effusion. No pulmonary embolism identified. Mediastinum/Nodes: No mass or enlarged lymph nodes seen within the mediastinum or perihilar regions. Esophagus appears normal. Trachea and central bronchi are unremarkable. Lungs/Pleura: Lungs are clear. No pleural effusion or pneumothorax. Musculoskeletal: No acute or suspicious osseous finding. Degenerative spondylosis throughout the thoracic spine, mild to moderate in degree. Review of the MIP images confirms the above findings. CTA ABDOMEN AND PELVIS FINDINGS VASCULAR Aorta: Normal caliber aorta without aneurysm, dissection, vasculitis or significant stenosis. Fairly extensive aortic atherosclerosis. Celiac: Celiac artery trunk is patent without evidence of aneurysm, dissection, vasculitis or significant stenosis. Normal contrast flow is seen to the RIGHT and LEFT hepatic arteries, although perhaps slightly diminished to the LEFT liver lobe. Normal contrast flow is seen to the splenic artery. SMA: Patent without evidence of aneurysm, dissection, vasculitis or significant stenosis. Renals: Both renal arteries are patent without evidence of aneurysm, dissection, vasculitis, fibromuscular dysplasia or significant stenosis. IMA: Patent without evidence of aneurysm,  dissection, vasculitis or significant stenosis. Inflow: Patent without evidence of aneurysm, dissection, vasculitis or significant stenosis. Veins: No obvious venous abnormality within the limitations of this arterial phase study. However, the main portal vein is prominent, measuring 1.9 cm diameter. Review of the MIP images confirms the above findings. NON-VASCULAR Hepatobiliary: Relatively diminished contrast enhancement of the LEFT liver lobe, compared to the RIGHT hepatic lobe. No focal mass or lesion within either liver lobe. Gallbladder walls appear thickened and there is at least mild pericholecystic edema suggesting acute cholecystitis. Common bile duct dilatation. 3 mm calcific density at the level of the distal common bile duct, highly suspicious for obstructing CBD stone. Pancreas: Unremarkable. No pancreatic ductal dilatation or surrounding inflammatory changes. Spleen: Normal in size without focal abnormality. Adrenals/Urinary Tract: Adrenal glands appear normal. Kidneys are unremarkable without mass, stone or hydronephrosis. Bladder is unremarkable,  partially decompressed. Stomach/Bowel: No dilated large or small bowel loops. No evidence of bowel wall inflammation. Appendix is normal. Mild diverticulosis of the sigmoid and descending colon without evidence of acute diverticulitis. Stomach is unremarkable, partially decompressed. Lymphatic: No enlarged lymph nodes seen in the abdomen or pelvis. Partially calcified structures within the upper abdomen are suspected to represent partially calcified lymph nodes indicating previous granulomatous infection, or chronic partially calcified blood vessel collateral/varices. Reproductive: Prostate is unremarkable. Other: No abscess collection. No free intraperitoneal air. Musculoskeletal: No acute or suspicious osseous finding. Degenerative changes within the mid and lower lumbar spine, moderate in degree. Review of the MIP images confirms the above findings.  IMPRESSION: 1. Common bile duct dilatation, measuring approximately 10 mm diameter. 3 mm calcific density at the level of the distal common bile duct, compatible with obstructing CBD stone. 2. Gallbladder walls appear thickened and there is at least mild pericholecystic edema suggesting an associated acute cholecystitis and/or cholangitis. 3. Relatively diminished contrast enhancement of the LEFT liver lobe, compared to the normal enhancement of the RIGHT hepatic lobe, likely delayed enhancement as there is normal contrast opacification of the LEFT hepatic artery branches, possibly related to shunting given the aforementioned cholecystitis/cholangitis. Recommend Doppler ultrasound of the portal vein to exclude the less likely possibility of portal vein thrombosis as a cause for this asymmetrically diminished enhancement. 4. Mild colonic diverticulosis without evidence of acute diverticulitis. 5. Coronary artery calcifications, particularly dense within the LEFT main and proximal LEFT anterior descending coronary arteries. Recommend correlation with any possible associated cardiac symptoms. 6. Mild cardiomegaly. Aortic Atherosclerosis (ICD10-I70.0). These results, considerations and recommendations were called by telephone at the time of interpretation on 01/08/2018 at 2:45 am to Dr. Darel Hong , who verbally acknowledged these results. Electronically Signed   By: Franki Cabot M.D.   On: 01/08/2018 02:52   US Abdomen Limited RUQ  Result Date: 01/08/2018 CLINICAL DATA:  Right upper quadrant pain. EXAM: ULTRASOUND ABDOMEN LIMITED RIGHT UPPER QUADRANT COMPARISON:  None. FINDINGS: Gallbladder: Mildly thickened gallbladder wall with focal pericholecystic fluid and layering sludge. No discrete stones identified. Murphy's sign is negative. Common bile duct: Diameter: 8.8 mm, mildly dilated.  No filling defects identified. Liver: No focal lesion identified. Within normal limits in parenchymal echogenicity. Portal  vein is patent on color Doppler imaging with normal direction of blood flow towards the liver. IMPRESSION: Gallbladder sludge, focal pericholecystic edema, and mild gallbladder wall thickening. Murphy's sign negative. Possibly chronic inflammation or stasis. Mild extrahepatic bile duct dilatation. No cause identified. Correlate with liver function studies. Electronically Signed   By: Lucienne Capers M.D.   On: 01/08/2018 03:29    No results found.  No results found.    Assessment and Plan: Patient Active Problem List   Diagnosis Date Noted  . Need for shingles vaccine 01/22/2019  . Need for vaccination against Streptococcus pneumoniae using pneumococcal conjugate vaccine 7 01/22/2019  . Encounter for general adult medical examination with abnormal findings 01/18/2018  . Abnormal liver function 01/18/2018  . S/P laparoscopic cholecystectomy 01/18/2018  . Bradycardia 01/18/2018  . Cutaneous candidiasis 01/18/2018  . Oral candidiasis 01/18/2018  . Septic shock ([place]) 01/08/2018  . Insect bite of right lower leg 09/25/2017  . Atopic dermatitis 09/25/2017  . PAD (peripheral artery disease) ([place]) 11/24/2015  . Type 2 diabetes mellitus with hyperglycemia, without long-term current use of insulin ([place]) 11/24/2015  . Hyperlipidemia 11/24/2015  . Essential hypertension 11/24/2015  . PVD (peripheral vascular disease) ([place]) 10/29/2015    1.  OSA on CPAP Good results, patient is benefiting from therapy.  continue to use as prescribed.   2. Essential hypertension Controlled, continue present management.   3. BMI 32.0-32.9,adult Obesity Counseling: Risk Assessment: An assessment of behavioral risk factors was made today and includes lack of exercise sedentary lifestyle, lack of portion control and poor dietary habits.  Risk Modification Advice: She was counseled on portion control guidelines. Restricting daily caloric intake to 1800. The detrimental long term effects of obesity on her health  and ongoing poor compliance was also discussed with the patient.    General Counseling: I have discussed the findings of the evaluation and examination with Effie Shy.  I have also discussed any further diagnostic evaluation thatmay be needed or ordered today. Aubert verbalizes understanding of the findings of todays visit. We also reviewed his medications today and discussed drug interactions and side effects including but not limited excessive drowsiness and altered mental states. We also discussed that there is always a risk not just to him but also people around him. he has been encouraged to call the office with any questions or concerns that should arise related to todays visit.  No orders of the defined types were placed in this encounter.    Time spent: 30 This patient was seen by Orson Gear AGNP-C in Collaboration with Dr. Devona Konig as a part of collaborative care agreement.   I have personally obtained a history, examined the patient, evaluated laboratory and imaging results, formulated the assessment and plan and placed orders.    Allyne Gee, MD [facility] Pulmonary and Critical Care Sleep medicine

## 2019-05-22 ENCOUNTER — Telehealth: Payer: Self-pay

## 2019-05-22 NOTE — Telephone Encounter (Signed)
Called lmom informing patient of appointment on 05/24/2019. klh 

## 2019-05-24 ENCOUNTER — Ambulatory Visit (INDEPENDENT_AMBULATORY_CARE_PROVIDER_SITE_OTHER): Payer: Medicare Other | Admitting: Adult Health

## 2019-05-24 ENCOUNTER — Encounter: Payer: Self-pay | Admitting: Adult Health

## 2019-05-24 ENCOUNTER — Other Ambulatory Visit: Payer: Self-pay

## 2019-05-24 VITALS — BP 142/53 | HR 61 | Temp 97.4°F | Resp 16 | Ht 68.0 in | Wt 225.4 lb

## 2019-05-24 DIAGNOSIS — I10 Essential (primary) hypertension: Secondary | ICD-10-CM | POA: Diagnosis not present

## 2019-05-24 DIAGNOSIS — E1165 Type 2 diabetes mellitus with hyperglycemia: Secondary | ICD-10-CM

## 2019-05-24 DIAGNOSIS — G4733 Obstructive sleep apnea (adult) (pediatric): Secondary | ICD-10-CM | POA: Diagnosis not present

## 2019-05-24 DIAGNOSIS — Z9989 Dependence on other enabling machines and devices: Secondary | ICD-10-CM

## 2019-05-24 LAB — POCT GLYCOSYLATED HEMOGLOBIN (HGB A1C): Hemoglobin A1C: 6.9 % — AB (ref 4.0–5.6)

## 2019-05-24 NOTE — Progress Notes (Signed)
[facility] [place], [place] [address]  Internal MEDICINE  Office Visit Note  Patient Name: Allen [name]  [id number]  [id number]  Date of Service: 05/24/2019  Chief Complaint  Patient presents with  . Follow-up    foot exam  . Diabetes  . Hypertension  . Sleep Apnea    HPI  PT is here for follow up on DM  HTN and sleep apnea.  He reports overall he is doing well.  He has been eating some stuff that he shouldn't.  His A1C today is 6.9 which is up from 6.7. He has had a few elevated blood sugars, but he has been relatively controlled. His bp is 142/53.  Denies Chest pain, Shortness of breath, palpitations, headache, or blurred vision.  He is wearing his cpap every night.    Current Medication: Outpatient Encounter Medications as of 05/24/2019  Medication Sig Note  . aspirin 81 MG chewable tablet Chew by mouth daily.   Marland Kitchen b complex vitamins tablet Take 1 tablet by mouth daily.   . B-D ULTRA-FINE 33 LANCETS MISC Use 1 Lancet 3 (three) times daily. 11/24/2015: Received from: [facility]  . cetirizine (ZYRTEC) 10 MG tablet Take 10 mg by mouth daily.   . Cholecalciferol (VITAMIN D3) 1000 units CAPS Take by mouth.   . clotrimazole-betamethasone (LOTRISONE) cream Apply 1 application topically 2 (two) times daily.   . Continuous Blood Gluc Sensor (FREESTYLE LIBRE SENSOR SYSTEM) MISC Use 3 each every 10 (ten) days.   . enalapril (VASOTEC) 20 MG tablet Take 1 tablet (20 mg total) by mouth daily.   Marland Kitchen ezetimibe-simvastatin (VYTORIN) 10-40 MG tablet Take 1 tablet by mouth daily.   Marland Kitchen FLUZONE HIGH-DOSE 0.5 ML injection ADM 0.5ML IM UTD   . folic acid (FOLVITE) 269 MCG tablet Take 400 mcg by mouth daily.   . Garlic 4854 MG CAPS Take by mouth.   Marland Kitchen glucose blood (ONE TOUCH ULTRA TEST) test strip Check blood sugar 5x a day as directed   . hydrochlorothiazide (MICROZIDE) 12.5 MG capsule Take 12.5 mg by mouth daily.   . insulin lispro (HUMALOG) 100 UNIT/ML  injection Inject 6-34 Units into the skin 3 (three) times daily before meals.    . Insulin Pen Needle (BD PEN NEEDLE NANO U/F) 32G X 4 MM MISC USE AS DIRECTED FOUR TIMES A DAY WITH LEVEMIR FLEXTOUCH AND HUMALOG PEN.  DX CODE E11.29   . Insulin Syringe-Needle U-100 (BD INSULIN SYRINGE U/F) 31G X 5/16" 0.3 ML MISC Inject insulin [place] TID as indicated per sliding scale instuctions.   Marland Kitchen JARDIANCE 25 MG TABS tablet Take 0.5 mg by mouth daily.    Marland Kitchen levothyroxine (SYNTHROID, LEVOTHROID) 50 MCG tablet Take 50 mcg by mouth daily before breakfast.   . metFORMIN (GLUCOPHAGE) 1000 MG tablet Take 1,000 mg by mouth daily with breakfast.    . metFORMIN (GLUCOPHAGE) 500 MG tablet Take 1,000 mg by mouth daily with breakfast.   . Multiple Vitamin (MULTIVITAMIN WITH MINERALS) TABS tablet Take 1 tablet by mouth daily.   . mupirocin ointment (BACTROBAN) 2 % Apply topically two times daily   . nystatin ointment (MYCOSTATIN) Apply 1 application topically 3 (three) times daily. (Patient taking differently: Apply 1 application topically as needed. )   . pioglitazone (ACTOS) 30 MG tablet Take 30 mg by mouth daily.    . pneumococcal 23 valent vaccine (PNEUMOVAX 23) 25 MCG/0.5ML injection Inject 0.22ml IM once   . traMADol (ULTRAM) 50 MG tablet  Take 100 mg by mouth daily.  11/24/2015: Received from: [facility]: 1-2 po bid prn  . triamcinolone cream (KENALOG) 0.1 % Apply 1 application topically 2 (two) times daily.   . vitamin B-12 (CYANOCOBALAMIN) 1000 MCG tablet Take 1,000 mcg by mouth daily.   Marland Kitchen Zoster Vaccine Adjuvanted [facility]) injection Shingles vaccine series - inject IM as directed .   . metoprolol succinate (TOPROL-XL) 25 MG 24 hr tablet Take 1 tablet (25 mg total) by mouth daily.    No facility-administered encounter medications on file as of 05/24/2019.    Surgical History: Past Surgical History:  Procedure Laterality Date  . CARDIAC CATHETERIZATION  1989  . CARDIAC CATHETERIZATION   1990  . GALLBLADDER SURGERY    . LEG SURGERY Right    stent placement    Medical History: Past Medical History:  Diagnosis Date  . Coronary artery disease   . Diabetes mellitus without complication ([place])   . Heart disease   . Hypertension   . MI (myocardial infarction) ([place])   . Sleep apnea     Family History: Family History  Problem Relation Age of Onset  . Diabetes Mother   . Brain cancer Mother   . Throat cancer Father   . Prostate cancer Brother     Social History   Socioeconomic History  . Marital status: Married    Spouse name: Not on file  . Number of children: Not on file  . Years of education: Not on file  . Highest education level: Not on file  Occupational History  . Not on file  Tobacco Use  . Smoking status: Former Smoker    Packs/day: 3.00    Years: 32.00    Pack years: 96.00    Types: Cigarettes  . Smokeless tobacco: Never Used  Substance and Sexual Activity  . Alcohol use: No  . Drug use: No  . Sexual activity: Not on file  Other Topics Concern  . Not on file  Social History Narrative  . Not on file   Social Determinants of Health   Financial Resource Strain:   . Difficulty of Paying Living Expenses:   Food Insecurity:   . Worried About Charity fundraiser in the Last Year:   . Arboriculturist in the Last Year:   Transportation Needs:   . Film/video editor (Medical):   Marland Kitchen Lack of Transportation (Non-Medical):   Physical Activity:   . Days of Exercise per Week:   . Minutes of Exercise per Session:   Stress:   . Feeling of Stress :   Social Connections:   . Frequency of Communication with Friends and Family:   . Frequency of Social Gatherings with Friends and Family:   . Attends Religious Services:   . Active Member of Clubs or Organizations:   . Attends Archivist Meetings:   Marland Kitchen Marital Status:   Intimate Partner Violence:   . Fear of Current or Ex-Partner:   . Emotionally Abused:   Marland Kitchen Physically Abused:   .  Sexually Abused:       Review of Systems  Constitutional: Negative.  Negative for chills, fatigue and unexpected weight change.  HENT: Negative.  Negative for congestion, rhinorrhea, sneezing and sore throat.   Eyes: Negative for redness.  Respiratory: Negative.  Negative for cough, chest tightness and shortness of breath.   Cardiovascular: Negative.  Negative for chest pain and palpitations.  Gastrointestinal: Negative.  Negative for abdominal pain,  constipation, diarrhea, nausea and vomiting.  Endocrine: Negative.   Genitourinary: Negative.  Negative for dysuria and frequency.  Musculoskeletal: Negative.  Negative for arthralgias, back pain, joint swelling and neck pain.  Skin: Negative.  Negative for rash.  Allergic/Immunologic: Negative.   Neurological: Negative.  Negative for tremors and numbness.  Hematological: Negative for adenopathy. Does not bruise/bleed easily.  Psychiatric/Behavioral: Negative.  Negative for behavioral problems, sleep disturbance and suicidal ideas. The patient is not nervous/anxious.     Vital Signs: BP (!) 142/53   Pulse 61   Temp (!) 97.4 F (36.3 C)   Resp 16   Ht 5\' 8"  (1.727 m)   Wt 225 lb 6.4 oz (102.2 kg)   SpO2 97%   BMI 34.27 kg/m    Physical Exam Vitals and nursing note reviewed.  Constitutional:      General: He is not in acute distress.    Appearance: He is well-developed. He is not diaphoretic.  HENT:     Head: Normocephalic and atraumatic.     Mouth/Throat:     Pharynx: No oropharyngeal exudate.  Eyes:     Pupils: Pupils are equal, round, and reactive to light.  Neck:     Thyroid: No thyromegaly.     Vascular: No JVD.     Trachea: No tracheal deviation.  Cardiovascular:     Rate and Rhythm: Normal rate and regular rhythm.     Heart sounds: Normal heart sounds. No murmur. No friction rub. No gallop.   Pulmonary:     Effort: Pulmonary effort is normal. No respiratory distress.     Breath sounds: Normal breath sounds. No  wheezing or rales.  Chest:     Chest wall: No tenderness.  Abdominal:     Palpations: Abdomen is soft.     Tenderness: There is no abdominal tenderness. There is no guarding.  Musculoskeletal:        General: Normal range of motion.     Cervical back: Normal range of motion and neck supple.  Lymphadenopathy:     Cervical: No cervical adenopathy.  Skin:    General: Skin is warm and dry.  Neurological:     Mental Status: He is alert and oriented to person, place, and time.     Cranial Nerves: No cranial nerve deficit.  Psychiatric:        Behavior: Behavior normal.        Thought Content: Thought content normal.        Judgment: Judgment normal.     Assessment/Plan: 1. Type 2 diabetes mellitus with hyperglycemia, without long-term current use of insulin (HCC) A1C slightly elevated.  Continue current medications and monitoring. Recheck in 3 months. - POCT HgB A1C  2. Essential hypertension Stable continue present therapy.  3. OSA on CPAP Continue to use cpap as directed.   General Counseling: sheffield hawker understanding of the findings of todays visit and agrees with plan of treatment. I have discussed any further diagnostic evaluation that may be needed or ordered today. We also reviewed his medications today. he has been encouraged to call the office with any questions or concerns that should arise related to todays visit.    Orders Placed This Encounter  Procedures  . POCT HgB A1C    No orders of the defined types were placed in this encounter.   Time spent: 30 Minutes   This patient was seen by Orson Gear AGNP-C in Collaboration with Dr Lavera Guise as a part of collaborative care  agreement     Kendell Bane AGNP-C Internal medicine

## 2019-06-20 ENCOUNTER — Other Ambulatory Visit: Payer: Self-pay

## 2019-06-20 MED ORDER — ENALAPRIL MALEATE 20 MG PO TABS: 20.0000 mg | ORAL_TABLET | Freq: Every day | ORAL | 2 refills | Status: DC

## 2019-06-21 ENCOUNTER — Telehealth: Payer: Self-pay

## 2019-06-21 NOTE — Telephone Encounter (Signed)
Verbal order for home medical equipment signed and placed in [facility] Patient folder.

## 2019-06-28 ENCOUNTER — Other Ambulatory Visit (INDEPENDENT_AMBULATORY_CARE_PROVIDER_SITE_OTHER): Payer: Self-pay | Admitting: Vascular Surgery

## 2019-06-28 DIAGNOSIS — I739 Peripheral vascular disease, unspecified: Secondary | ICD-10-CM

## 2019-07-02 ENCOUNTER — Ambulatory Visit (INDEPENDENT_AMBULATORY_CARE_PROVIDER_SITE_OTHER): Payer: Medicare Other

## 2019-07-02 ENCOUNTER — Encounter (INDEPENDENT_AMBULATORY_CARE_PROVIDER_SITE_OTHER): Payer: Self-pay | Admitting: Nurse Practitioner

## 2019-07-02 ENCOUNTER — Ambulatory Visit (INDEPENDENT_AMBULATORY_CARE_PROVIDER_SITE_OTHER): Payer: Medicare Other | Admitting: Nurse Practitioner

## 2019-07-02 ENCOUNTER — Other Ambulatory Visit: Payer: Self-pay

## 2019-07-02 VITALS — BP 122/53 | HR 50 | Resp 16 | Wt 220.8 lb

## 2019-07-02 DIAGNOSIS — I739 Peripheral vascular disease, unspecified: Secondary | ICD-10-CM

## 2019-07-02 DIAGNOSIS — E1165 Type 2 diabetes mellitus with hyperglycemia: Secondary | ICD-10-CM | POA: Diagnosis not present

## 2019-07-02 DIAGNOSIS — I10 Essential (primary) hypertension: Secondary | ICD-10-CM

## 2019-07-02 DIAGNOSIS — E785 Hyperlipidemia, unspecified: Secondary | ICD-10-CM | POA: Diagnosis not present

## 2019-07-02 DIAGNOSIS — I2510 Atherosclerotic heart disease of native coronary artery without angina pectoris: Secondary | ICD-10-CM

## 2019-07-02 NOTE — Progress Notes (Signed)
Subjective:    Patient ID: Allen [name], male    DOB: 02-07-1941, 79 y.o.   MRN: [id number] Chief Complaint  Patient presents with  . Follow-up    ultrasound follow up    The patient returns to the office for followup and review of the noninvasive studies.  Due to COVID-19 the patient has not followed up in nearly 2 years.  The patient has history of previous intervention on the right lower extremity in 2013 in 2018.  There have been no interval changes in lower extremity symptoms. No interval shortening of the patient's claudication distance or development of rest pain symptoms. No new ulcers or wounds have occurred since the last visit.  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  There have been no significant changes to the patient's overall health care.  The patient denies amaurosis fugax or recent TIA symptoms. There are no recent neurological changes noted. The patient denies history of DVT, PE or superficial thrombophlebitis. The patient denies recent episodes of angina or shortness of breath.   ABI Rt=1.02 and Lt=0.50  (previous ABI's Rt=1.26 and Lt=0.53) Duplex ultrasound of the right lower extremity has triphasic/biphasic waveforms throughout the lower extremity from the common femoral to the distal tibials.  The left lower extremity has triphasic waveforms in the common femoral and superficial femoral proximal artery.  The deep femoral has monophasic waveforms.  The mid SFA has monophasic waveforms extending to the distal tibial arteries.  The distal SFA has about 50 to 74% stenosis.   Review of Systems  Neurological: Positive for weakness and numbness.  All other systems reviewed and are negative.      Objective:   Physical Exam Vitals reviewed.  HENT:     Head: Normocephalic.  Cardiovascular:     Rate and Rhythm: Normal rate and regular rhythm.    Pulmonary:     Effort: Pulmonary effort is normal.     Breath sounds: Normal breath sounds.  Musculoskeletal:     Cervical back: Normal range of motion.  Skin:    General: Skin is warm.     Capillary Refill: Capillary refill takes 2 to 3 seconds.  Neurological:     Mental Status: He is alert and oriented to person, place, and time.  Psychiatric:        Mood and Affect: Mood normal.        Behavior: Behavior normal.        Thought Content: Thought content normal.        Judgment: Judgment normal.     BP (!) 122/53 (BP Location: Right Arm)   Pulse (!) 50   Resp 16   Wt 220 lb 12.8 oz (100.2 kg)   BMI 33.57 kg/m   Past Medical History:  Diagnosis Date  . Coronary artery disease   . Diabetes mellitus without complication ([place])   . Heart disease   . Hypertension   . MI (myocardial infarction) ([place])   . Sleep apnea     Social History   Socioeconomic History  . Marital status: Married    Spouse name: Not on file  . Number of children: Not on file  . Years of education: Not on file  . Highest education level: Not on file  Occupational History  . Not on file  Tobacco Use  . Smoking status: Former Smoker    Packs/day: 3.00  Years: 32.00    Pack years: 96.00    Types: Cigarettes  . Smokeless tobacco: Never Used  Substance and Sexual Activity  . Alcohol use: No  . Drug use: No  . Sexual activity: Not on file  Other Topics Concern  . Not on file  Social History Narrative  . Not on file   Social Determinants of Health   Financial Resource Strain:   . Difficulty of Paying Living Expenses:   Food Insecurity:   . Worried About Charity fundraiser in the Last Year:   . Arboriculturist in the Last Year:   Transportation Needs:   . Film/video editor (Medical):   Marland Kitchen Lack of Transportation (Non-Medical):   Physical Activity:   . Days of Exercise per Week:   . Minutes of Exercise per Session:   Stress:   . Feeling of Stress :   Social Connections:   .  Frequency of Communication with Friends and Family:   . Frequency of Social Gatherings with Friends and Family:   . Attends Religious Services:   . Active Member of Clubs or Organizations:   . Attends Archivist Meetings:   Marland Kitchen Marital Status:   Intimate Partner Violence:   . Fear of Current or Ex-Partner:   . Emotionally Abused:   Marland Kitchen Physically Abused:   . Sexually Abused:     Past Surgical History:  Procedure Laterality Date  . CARDIAC CATHETERIZATION  1989  . CARDIAC CATHETERIZATION  1990  . GALLBLADDER SURGERY    . LEG SURGERY Right    stent placement    Family History  Problem Relation Age of Onset  . Diabetes Mother   . Brain cancer Mother   . Throat cancer Father   . Prostate cancer Brother     Allergies  Allergen Reactions  . Iodinated Diagnostic Agents Anaphylaxis and Rash    TOLERATED CT CONTRAST ON 01/07/2018 WITH NO ISSUES AFTER 10 MINUTE PRETREATMENT X-Ray contrast dyes- anaphylaxis & rash X-Ray contrast dyes- anaphylaxis & rash  X-Ray contrast dyes- anaphylaxis & rash TOLERATED CT CONTRAST ON 01/07/2018 WITH NO ISSUES AFTER 10 MINUTE PRETREATMENT X-Ray contrast dyes- anaphylaxis & rash X-Ray contrast dyes- anaphylaxis & rash  . Iohexol Hives and Shortness Of Breath    Pt states that he had IV dye for a kidney x-ray years ago and after injection of the dye he had hives all over his body, throat swelling, and SOB. Pt states that he had IV dye for a kidney x-ray years ago and after injection of the dye he had hives all over his body, throat swelling, and SOB. Pt states that he had IV dye for a kidney x-ray years ago and after injection of the dye he had hives all over his body, throat swelling, and SOB.  . Duloxetine Other (See Comments)    Sick to stomach and chills  . Dye Fdc Red [Red Dye] Hives  . Fluticasone Other (See Comments)    Caused nosebleeds  . Gabapentin Other (See Comments)  . Penicillins Other (See Comments)  . Other Rash         Assessment & Plan:   1. PAD (peripheral artery disease) (HCC)  Recommend:  The patient has evidence of atherosclerosis of the lower extremities with claudication.  The patient does not voice lifestyle limiting changes at this point in time.  Noninvasive studies do not suggest clinically significant change.  No invasive studies, angiography or surgery at this time The  patient should continue walking and begin a more formal exercise program.  The patient should continue antiplatelet therapy and aggressive treatment of the lipid abnormalities  No changes in the patient's medications at this time  The patient should continue wearing graduated compression socks 10-15 mmHg strength to control the mild edema.    2. Hyperlipidemia, unspecified hyperlipidemia type Continue statin as ordered and reviewed, no changes at this time   3. Essential hypertension Continue antihypertensive medications as already ordered, these medications have been reviewed and there are no changes at this time.   4. Type 2 diabetes mellitus with hyperglycemia, without long-term current use of insulin (HCC) Continue hypoglycemic medications as already ordered, these medications have been reviewed and there are no changes at this time.  Hgb A1C to be monitored as already arranged by primary service    Current Outpatient Medications on File Prior to Visit  Medication Sig Dispense Refill  . aspirin 81 MG chewable tablet Chew by mouth daily.    Marland Kitchen b complex vitamins tablet Take 1 tablet by mouth daily.    . B-D ULTRA-FINE 33 LANCETS MISC Use 1 Lancet 3 (three) times daily.    . cetirizine (ZYRTEC) 10 MG tablet Take 10 mg by mouth daily.    . Cholecalciferol (VITAMIN D3) 1000 units CAPS Take by mouth.    . clotrimazole-betamethasone (LOTRISONE) cream Apply 1 application topically 2 (two) times daily. 45 g 2  . Continuous Blood Gluc Sensor (FREESTYLE LIBRE SENSOR SYSTEM) MISC Use 3 each every 10 (ten) days.    .  enalapril (VASOTEC) 20 MG tablet Take 1 tablet (20 mg total) by mouth daily. 30 tablet 2  . ezetimibe-simvastatin (VYTORIN) 10-40 MG tablet Take 1 tablet by mouth daily. 90 tablet 2  . FLUZONE HIGH-DOSE 0.5 ML injection ADM 6.6AY IM UTD  0  . folic acid (FOLVITE) 301 MCG tablet Take 400 mcg by mouth daily.    . Garlic 6010 MG CAPS Take by mouth.    Marland Kitchen glucose blood (ONE TOUCH ULTRA TEST) test strip Check blood sugar 5x a day as directed    . hydrochlorothiazide (MICROZIDE) 12.5 MG capsule Take 12.5 mg by mouth daily.    . insulin lispro (HUMALOG) 100 UNIT/ML injection Inject 6-34 Units into the skin 3 (three) times daily before meals.     . Insulin Pen Needle (BD PEN NEEDLE NANO U/F) 32G X 4 MM MISC USE AS DIRECTED FOUR TIMES A DAY WITH LEVEMIR FLEXTOUCH AND HUMALOG PEN.  DX CODE E11.29    . Insulin Syringe-Needle U-100 (BD INSULIN SYRINGE U/F) 31G X 5/16" 0.3 ML MISC Inject insulin [place] TID as indicated per sliding scale instuctions. 90 each 3  . JARDIANCE 25 MG TABS tablet Take 0.5 mg by mouth daily.   4  . levothyroxine (SYNTHROID, LEVOTHROID) 50 MCG tablet Take 50 mcg by mouth daily before breakfast.    . metFORMIN (GLUCOPHAGE) 1000 MG tablet Take 1,000 mg by mouth daily with breakfast.     . metFORMIN (GLUCOPHAGE) 500 MG tablet Take 1,000 mg by mouth daily with breakfast.    . Multiple Vitamin (MULTIVITAMIN WITH MINERALS) TABS tablet Take 1 tablet by mouth daily.    . mupirocin ointment (BACTROBAN) 2 % Apply topically two times daily 22 g 1  . nystatin ointment (MYCOSTATIN) Apply 1 application topically 3 (three) times daily. (Patient taking differently: Apply 1 application topically as needed. ) 30 g 2  . pioglitazone (ACTOS) 30 MG tablet Take 30 mg by mouth  daily.     . pneumococcal 23 valent vaccine (PNEUMOVAX 23) 25 MCG/0.5ML injection Inject 0.35ml IM once 0.5 mL 0  . traMADol (ULTRAM) 50 MG tablet Take 100 mg by mouth daily.     Marland Kitchen triamcinolone cream (KENALOG) 0.1 % Apply 1 application  topically 2 (two) times daily. 45 g 3  . vitamin B-12 (CYANOCOBALAMIN) 1000 MCG tablet Take 1,000 mcg by mouth daily.    Marland Kitchen Zoster Vaccine Adjuvanted [facility]) injection Shingles vaccine series - inject IM as directed . 0.5 mL 1  . metoprolol succinate (TOPROL-XL) 25 MG 24 hr tablet Take 1 tablet (25 mg total) by mouth daily. 90 tablet 1   No current facility-administered medications on file prior to visit.    There are no Patient Instructions on file for this visit. No follow-ups on file.   Kris Hartmann, NP

## 2019-07-13 DIAGNOSIS — M5416 Radiculopathy, lumbar region: Secondary | ICD-10-CM | POA: Diagnosis not present

## 2019-07-13 DIAGNOSIS — Z79899 Other long term (current) drug therapy: Secondary | ICD-10-CM | POA: Diagnosis not present

## 2019-07-13 DIAGNOSIS — M48062 Spinal stenosis, lumbar region with neurogenic claudication: Secondary | ICD-10-CM | POA: Diagnosis not present

## 2019-07-13 DIAGNOSIS — M5136 Other intervertebral disc degeneration, lumbar region: Secondary | ICD-10-CM | POA: Diagnosis not present

## 2019-07-19 DIAGNOSIS — E1122 Type 2 diabetes mellitus with diabetic chronic kidney disease: Secondary | ICD-10-CM | POA: Diagnosis not present

## 2019-07-19 DIAGNOSIS — I152 Hypertension secondary to endocrine disorders: Secondary | ICD-10-CM | POA: Diagnosis not present

## 2019-07-19 DIAGNOSIS — E669 Obesity, unspecified: Secondary | ICD-10-CM | POA: Diagnosis not present

## 2019-07-19 DIAGNOSIS — E785 Hyperlipidemia, unspecified: Secondary | ICD-10-CM | POA: Diagnosis not present

## 2019-07-19 DIAGNOSIS — E1169 Type 2 diabetes mellitus with other specified complication: Secondary | ICD-10-CM | POA: Diagnosis not present

## 2019-07-19 DIAGNOSIS — E039 Hypothyroidism, unspecified: Secondary | ICD-10-CM | POA: Diagnosis not present

## 2019-07-19 DIAGNOSIS — Z794 Long term (current) use of insulin: Secondary | ICD-10-CM | POA: Diagnosis not present

## 2019-07-19 DIAGNOSIS — E1159 Type 2 diabetes mellitus with other circulatory complications: Secondary | ICD-10-CM | POA: Diagnosis not present

## 2019-07-19 DIAGNOSIS — E538 Deficiency of other specified B group vitamins: Secondary | ICD-10-CM | POA: Diagnosis not present

## 2019-07-19 DIAGNOSIS — N181 Chronic kidney disease, stage 1: Secondary | ICD-10-CM | POA: Diagnosis not present

## 2019-07-23 ENCOUNTER — Other Ambulatory Visit: Payer: Self-pay

## 2019-07-23 MED ORDER — METOPROLOL SUCCINATE ER 25 MG PO TB24: 25.0000 mg | ORAL_TABLET | Freq: Every day | ORAL | 1 refills | Status: DC

## 2019-08-15 ENCOUNTER — Ambulatory Visit: Payer: Medicare Other | Attending: Family Medicine | Admitting: Physical Therapy

## 2019-08-15 ENCOUNTER — Encounter: Payer: Self-pay | Admitting: Physical Therapy

## 2019-08-15 ENCOUNTER — Other Ambulatory Visit: Payer: Self-pay

## 2019-08-15 DIAGNOSIS — G8929 Other chronic pain: Secondary | ICD-10-CM | POA: Diagnosis present

## 2019-08-15 DIAGNOSIS — R262 Difficulty in walking, not elsewhere classified: Secondary | ICD-10-CM | POA: Insufficient documentation

## 2019-08-15 DIAGNOSIS — M5441 Lumbago with sciatica, right side: Secondary | ICD-10-CM | POA: Diagnosis not present

## 2019-08-16 ENCOUNTER — Telehealth: Payer: Self-pay

## 2019-08-16 NOTE — Telephone Encounter (Signed)
Confirmed appointment on 07/06/2021and screened for covid. [facility]

## 2019-08-16 NOTE — Therapy (Signed)
[facility] [address], [place], [address] Phone: [phone]   Fax:  [phone]  Physical Therapy Evaluation  Patient Details  Name: Allen [name] MRN: [id number] Date of Birth: 20-Apr-1940 No data recorded  Encounter Date: 08/15/2019   PT End of Session - 08/15/19 1524    Visit Number 1    Number of Visits 16    Date for PT Re-Evaluation 10/10/19    Authorization - Visit Number 1    Authorization - Number of Visits 10    PT Start Time 1950    PT Stop Time 1115    PT Time Calculation (min) 60 min    Equipment Utilized During Treatment Gait belt    Activity Tolerance Patient tolerated treatment well    Behavior During Therapy WFL for tasks assessed/performed           Past Medical History:  Diagnosis Date  . Coronary artery disease   . Diabetes mellitus without complication ([place])   . Heart disease   . Hypertension   . MI (myocardial infarction) ([place])   . Sleep apnea     Past Surgical History:  Procedure Laterality Date  . CARDIAC CATHETERIZATION  1989  . CARDIAC CATHETERIZATION  1990  . GALLBLADDER SURGERY    . LEG SURGERY Right    stent placement    There were no vitals filed for this visit.    Subjective Assessment - 08/15/19 1254    Pertinent History Pt is a pleasant 79 y.o. male returning to PT for chronic LBP (>6 years).  Pt has no hx of back trauma, and has received previous PT, injections, and chiropractor tx to address back pain with little success.  Pt reports past PT was very helpful in reducing pain and improving balance deficits.  Pt describes pain in low back as dull ache with intermittent stabbing pain that radiates into BLE R>L (Worts 5/10, Best and Current 1/10).  Pain worsens throughout the day and is aggravated by standing and walking; eases with sitting break, rest, and pain medications.  Pt reports frequent near falls at least once a day with 1 fall in the past 6 months;  increased difficulty with unlevel surfaces, especially grass.  Pt is retired and ADLs encompass household chores and activities.  Pt would like to be able to return to walking around his neighborhood (1 mile) and kayaking.  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  Pt denies N/V, B&B changes, unexplained weight fluctuation, saddle paresthesia, fever, night sweats, or unrelenting night pain at this time.    Limitations Lifting;Standing;House hold activities;Walking    How long can you sit comfortably? Unlimited    How long can you stand comfortably? 10 minutes    How long can you walk comfortably? 2 minutes    Patient Stated Goals Walk 30 minutes without rest break    Currently in Pain? Yes    Pain Score 1     Pain Location Back    Pain Orientation Lower;Left;Right    Pain Descriptors / Indicators Dull;Aching;Stabbing    Pain Type Chronic pain    Pain Radiating Towards B posterior hip, thigh and feet (R>L)    Pain Onset More than a month ago    Pain Frequency Constant    Aggravating Factors  Prolonged standing and walking    Pain Relieving Factors Rest, pain medications    Effect of Pain on Daily Activities Decreased physical activity,  requires frequent rest breaks which prolong or prevent ability to complete ADLs            OBJECTIVE  Mental Status Patient is oriented to person, place and time.  Recent memory is intact.  Remote memory is intact.  Attention span and concentration are intact.  Expressive speech is intact.  Patient's fund of knowledge is within normal limits for educational level.  SENSATION: Altered sensation to light touch at L3 B, absent at L4-S1 B consistent with peripheral neuropathy Proprioception and hot/cold testing deferred on this date   MUSCULOSKELETAL: Tremor: None Bulk: Normal Tone: Normal No visible step-off along spinal column  Posture Decreased lumbar lordosis, flexed posture, forward head, rounded shoulders  Gait Amb with SPC on  L with decreased step length, decreased lumbar lordosis, flexed posture, intermittent inconsistent gait deviations from midline, Trendelenburg with stance on L>R, and R circumduction during swing phase   Palpation Palpation to lumbar paraspinals reveals B tightness R>L with no pain Palpation to glute max/med lateral to sacrum and along ilium unremarkable with no pain  Strength (out of 5) R/L 5/4+ Hip flexion 5/5 Hip ER 5/5 Hip IR 4+/5 Hip abduction 4+/5 Hip adduction 5/5 Hip extension 4+/4+ Knee extension 5/5 Knee flexion 4/4 Ankle dorsiflexion 5/5 Ankle plantarflexion  *Indicates pain   AROM (degrees) R/L (all movements include overpressure unless otherwise stated) Lumbar forward flexion (65): 25% limited Lumbar extension (30): 50% limited Lumbar lateral flexion (25): R: <25% limited  L: 25% limited Thoracic and Lumbar rotation (30 degrees):  R: 25% limited L: nearly 100% limited Hip IR (0-45): ~20d B Hip ER (0-45): WNL B Hip Flexion (0-125): WNL B Hip Abduction (0-40): WNL B Hip extension (0-15): R: 10d L: 0d *Indicates pain   PROM (degrees) PROM = AROM   Repeated Movements Repeated flexion increased LBP, no radicular sx reported. Repeated extension decreased LBP, no radicular sx reported.   Muscle Length Hamstrings: L limited 45d, R limited 35d   Passive Accessory Intervertebral Motion (PAIVM) CPA L1-L5 produced sharp shooting pain into RLE and UPA bilaterally L1-L5 reveals sharp shooting pain at L1-5 on R. Generally hypomobile throughout  Passive Physiological Intervertebral Motion (PPIVM) Normal flexion and extension with PPIVM testing   SPECIAL TESTS Lumbar Radiculopathy and Discogenic: Centralization and Peripheralization: Reduction of LBP with repeated extension SLR: R: Positive L:  Negative  Hip: FABER (SN 81): R: Negative L: Negative FADIR (SN 94): R: Negative L: Negative  Functional Tasks Squatting: pt with good squat mechanics, hinges at hips  and lowers to floor with minimal lumbar flexion, able to squat low enough to pick up object off of floor safely  Sit to stand: pt with good standing mechanics, BUE support with little to no WB for initiation with good trunk flexion and forward momentum over toes with appropriate sequencing of hip extension; pt with moments of postural sway and stepping strategies in standing to maintain balance, no LOB  Outcome Measures 2 minute walk test: 248ft Gait speed: 0.88m/s   TherEx: -Posterior pelvic tilt x10 with cueing for TrA activation and preventing breath holding -Repeated extensions x10 with reduction of LBP -Static standing with no UE support x1 min -Narrow stance with no UE support x30 sec -Tandem stance trial, unable to obtain and hold position safely without UE support, no LOB -Modified tandem standing x30 sec each LE with intermittent UE support   Objective measurements completed on examination: See above findings.        PT Education - 08/15/19 1259  Education provided Yes    Education Details Patient was educated on diagnosis, anatomy and pathology involved, prognosis, role of PT, and was given an HEP, demonstrating exercise with proper form following verbal and tactile cues, and was given a paper hand out to continue exercise at home. Pt was educated on and agreed to plan of care.    Person(s) Educated Patient    Methods Explanation;Demonstration;Tactile cues;Verbal cues;Handout    Comprehension Verbalized understanding;Returned demonstration;Need further instruction;Verbal cues required            PT Short Term Goals - 08/15/19 1618      PT SHORT TERM GOAL #1   Title Pt will be independent with HEP in order to improve strength and decrease back pain in order to improve pain-free function at home and work.     Baseline 08/15/19    Time 4    Period Weeks    Status New    Target Date 09/12/19             PT Long Term Goals - 08/15/19 1620      PT LONG TERM  GOAL #1   Title Pt will decrease worst back pain as reported on NPRS by at least 2 points in order to demonstrate clinically significant reduction in back pain.     Baseline 08/15/19 Worst 5/10    Time 8    Period Weeks    Status New      PT LONG TERM GOAL #2   Title Pt will ambulate for 30 minutes on outdoor pavement with LBP 3/10 or less to demonstrate decreased pain with prolonged walking.    Baseline 08/15/19 Pt requires rest break after 2-5 minutes of ambulation d/t LBP    Time 8    Period Weeks    Status New    Target Date 10/10/19      PT LONG TERM GOAL #3   Title Pt will increase 10MWT by at least 0.13 m/s in order to demonstrate clinically significant improvement in community ambulation.     Baseline 08/15/19 0.67 m/s    Time 8    Period Weeks    Status New    Target Date 10/10/19                  Plan - 08/16/19 0941    Clinical Impression Statement Pt is a pleasant 80 y.o. male referred for low back pain.  PT examination reveals deficits LBP with BLE radiculopathy (R>L).  Pt presents with deficits in LE strength, lumbar mobility, hip/lumbar ROM, proprioception, balance, gait, and pain.  Pt activity limitations in walking, standing, bending and lifting interfere with ability to complete ADLs at home and fully participate in the community.  Pt will benefit from skilled PT services to address deficits and return to optimal function at home and in the community with reduced pain.    Personal Factors and Comorbidities Age;Comorbidity 1;Comorbidity 2;Comorbidity 3+    Comorbidities DM, HTN, overweight    Examination-Activity Limitations Locomotion Level;Caring for Others;Stand;Stairs;Squat;Bend    Examination-Participation Restrictions Art gallery manager;Yard Work;Laundry;Other   Walking outdoors, kayaking   Stability/Clinical Decision Making Evolving/Moderate complexity    Clinical Decision Making Moderate    Rehab Potential Fair    Clinical Impairments Affecting  Rehab Potential Negative: Chronic condition, co-morbidities. Positive: motivated to improve, good results from previous PT.     PT Frequency 2x / week    PT Duration 8 weeks    PT Treatment/Interventions ADLs/Self Care Home Management;Cryotherapy;Dealer  Stimulation;Moist Heat;DME Instruction;Traction;Gait training;Stair training;Functional mobility training;Therapeutic activities;Neuromuscular re-education;Balance training;Therapeutic exercise;Patient/family education;Manual techniques;Passive range of motion;Dry needling;Energy conservation    PT Next Visit Plan 6MWT, balance, core and hip strengthening    PT Home Exercise Plan posterior pelvic tilt, lumbar ext, modified tandem standing endurance with counter support    Consulted and Agree with Plan of Care Patient           Patient will benefit from skilled therapeutic intervention in order to improve the following deficits and impairments:  Abnormal gait, Decreased balance, Decreased endurance, Decreased mobility, Difficulty walking, Hypomobility, Impaired sensation, Decreased range of motion, Improper body mechanics, Decreased activity tolerance, Decreased coordination, Decreased strength, Impaired flexibility, Postural dysfunction, Pain  Visit Diagnosis: Chronic midline low back pain with right-sided sciatica  Difficulty in walking, not elsewhere classified     Problem List Patient Active Problem List   Diagnosis Date Noted  . Need for shingles vaccine 01/22/2019  . Need for vaccination against Streptococcus pneumoniae using pneumococcal conjugate vaccine 7 01/22/2019  . Encounter for general adult medical examination with abnormal findings 01/18/2018  . Abnormal liver function 01/18/2018  . S/P laparoscopic cholecystectomy 01/18/2018  . Bradycardia 01/18/2018  . Cutaneous candidiasis 01/18/2018  . Oral candidiasis 01/18/2018  . Septic shock ([place]) 01/08/2018  . Insect bite of right lower leg 09/25/2017  . Atopic dermatitis  09/25/2017  . PAD (peripheral artery disease) ([place]) 11/24/2015  . Type 2 diabetes mellitus with hyperglycemia, without long-term current use of insulin ([place]) 11/24/2015  . Hyperlipidemia 11/24/2015  . Essential hypertension 11/24/2015  . PVD (peripheral vascular disease) ([place]) 10/29/2015    Durwin Reges DPT Chinita Greenland, SPT Durwin Reges 08/16/2019, 11:33 AM  [facility] [address], [place], [address] Phone: [phone]   Fax:  [phone]  Name: Niall Illes MRN: [id number] Date of Birth: [birth year]

## 2019-08-21 ENCOUNTER — Encounter: Payer: Self-pay | Admitting: Adult Health

## 2019-08-21 ENCOUNTER — Other Ambulatory Visit: Payer: Self-pay

## 2019-08-21 ENCOUNTER — Ambulatory Visit (INDEPENDENT_AMBULATORY_CARE_PROVIDER_SITE_OTHER): Payer: Medicare Other | Admitting: Adult Health

## 2019-08-21 VITALS — BP 133/51 | HR 60 | Temp 97.5°F | Resp 16 | Ht 68.0 in | Wt 219.0 lb

## 2019-08-21 DIAGNOSIS — E1165 Type 2 diabetes mellitus with hyperglycemia: Secondary | ICD-10-CM

## 2019-08-21 DIAGNOSIS — I10 Essential (primary) hypertension: Secondary | ICD-10-CM

## 2019-08-21 DIAGNOSIS — Z6833 Body mass index (BMI) 33.0-33.9, adult: Secondary | ICD-10-CM

## 2019-08-21 DIAGNOSIS — Z9989 Dependence on other enabling machines and devices: Secondary | ICD-10-CM | POA: Diagnosis not present

## 2019-08-21 DIAGNOSIS — G4733 Obstructive sleep apnea (adult) (pediatric): Secondary | ICD-10-CM | POA: Diagnosis not present

## 2019-08-21 LAB — POCT GLYCOSYLATED HEMOGLOBIN (HGB A1C): Hemoglobin A1C: 7.1 % — AB (ref 4.0–5.6)

## 2019-08-21 NOTE — Progress Notes (Signed)
[facility] [place], [place] [address]  Internal MEDICINE  Office Visit Note  Patient Name: Allen [name]  [id number]  [id number]  Date of Service: 08/21/2019  Chief Complaint  Patient presents with  . Follow-up  . Diabetes  . Hypertension    HPI  Pt is here for follow up on DM, osa, and HTn.  His A1C is slightly increased to 7.1 at this time.  He sees endocrinology for medication management of this.  He denies any new or current issues.  His blood pressure today is 133/51, Denies Chest pain, Shortness of breath, palpitations, headache, or blurred vision. He uses CPAP for his OSA without difficulty. He has recently been on steroids for his back, and his sugars were elevated during this.     Current Medication: Outpatient Encounter Medications as of 08/21/2019  Medication Sig Note  . aspirin 81 MG chewable tablet Chew by mouth daily.   Marland Kitchen b complex vitamins tablet Take 1 tablet by mouth daily.   . B-D ULTRA-FINE 33 LANCETS MISC Use 1 Lancet 3 (three) times daily. 11/24/2015: Received from: [facility]  . cetirizine (ZYRTEC) 10 MG tablet Take 10 mg by mouth daily.   . Cholecalciferol (VITAMIN D3) 1000 units CAPS Take by mouth.   . clotrimazole-betamethasone (LOTRISONE) cream Apply 1 application topically 2 (two) times daily.   . Continuous Blood Gluc Sensor (FREESTYLE LIBRE SENSOR SYSTEM) MISC Use 3 each every 10 (ten) days.   . enalapril (VASOTEC) 20 MG tablet Take 1 tablet (20 mg total) by mouth daily.   Marland Kitchen ezetimibe-simvastatin (VYTORIN) 10-40 MG tablet Take 1 tablet by mouth daily.   . folic acid (FOLVITE) 825 MCG tablet Take 400 mcg by mouth daily.   . Garlic 0539 MG CAPS Take by mouth.   Marland Kitchen glipiZIDE (GLUCOTROL XL) 5 MG 24 hr tablet Take 1 tablet by mouth daily.   Marland Kitchen glucose blood (ONE TOUCH ULTRA TEST) test strip Check blood sugar 5x a day as directed   . hydrochlorothiazide (MICROZIDE) 12.5 MG capsule Take 12.5 mg by mouth daily.   .  insulin lispro (HUMALOG) 100 UNIT/ML injection Inject 6-34 Units into the skin 3 (three) times daily before meals.    . Insulin Pen Needle (BD PEN NEEDLE NANO U/F) 32G X 4 MM MISC USE AS DIRECTED FOUR TIMES A DAY WITH LEVEMIR FLEXTOUCH AND HUMALOG PEN.  DX CODE E11.29   . Insulin Syringe-Needle U-100 (BD INSULIN SYRINGE U/F) 31G X 5/16" 0.3 ML MISC Inject insulin [place] TID as indicated per sliding scale instuctions.   Marland Kitchen JARDIANCE 25 MG TABS tablet Take 0.5 mg by mouth daily.    Marland Kitchen levothyroxine (SYNTHROID, LEVOTHROID) 50 MCG tablet Take 50 mcg by mouth daily before breakfast.   . metFORMIN (GLUCOPHAGE) 1000 MG tablet Take 1,000 mg by mouth daily with breakfast.    . metFORMIN (GLUCOPHAGE) 500 MG tablet Take 1,000 mg by mouth daily with breakfast.   . metoprolol succinate (TOPROL-XL) 25 MG 24 hr tablet Take 1 tablet (25 mg total) by mouth daily.   . Multiple Vitamin (MULTIVITAMIN WITH MINERALS) TABS tablet Take 1 tablet by mouth daily.   . mupirocin ointment (BACTROBAN) 2 % Apply topically two times daily   . nystatin ointment (MYCOSTATIN) Apply 1 application topically 3 (three) times daily. (Patient taking differently: Apply 1 application topically as needed. )   . pioglitazone (ACTOS) 30 MG tablet Take 30 mg by mouth daily.    . pneumococcal 23 valent  vaccine (PNEUMOVAX 23) 25 MCG/0.5ML injection Inject 0.68ml IM once   . traMADol (ULTRAM) 50 MG tablet Take 100 mg by mouth daily.  11/24/2015: Received from: [facility]: 1-2 po bid prn  . triamcinolone cream (KENALOG) 0.1 % Apply 1 application topically 2 (two) times daily.   . vitamin B-12 (CYANOCOBALAMIN) 1000 MCG tablet Take 1,000 mcg by mouth daily.   Marland Kitchen Zoster Vaccine Adjuvanted [facility]) injection Shingles vaccine series - inject IM as directed .   . [DISCONTINUED] FLUZONE HIGH-DOSE 0.5 ML injection ADM 0.5ML IM UTD (Patient not taking: Reported on 08/15/2019)    No facility-administered encounter medications on file  as of 08/21/2019.    Surgical History: Past Surgical History:  Procedure Laterality Date  . CARDIAC CATHETERIZATION  1989  . CARDIAC CATHETERIZATION  1990  . GALLBLADDER SURGERY    . LEG SURGERY Right    stent placement    Medical History: Past Medical History:  Diagnosis Date  . Coronary artery disease   . Diabetes mellitus without complication ([place])   . Heart disease   . Hypertension   . MI (myocardial infarction) ([place])   . Sleep apnea     Family History: Family History  Problem Relation Age of Onset  . Diabetes Mother   . Brain cancer Mother   . Throat cancer Father   . Prostate cancer Brother     Social History   Socioeconomic History  . Marital status: Married    Spouse name: Not on file  . Number of children: Not on file  . Years of education: Not on file  . Highest education level: Not on file  Occupational History  . Not on file  Tobacco Use  . Smoking status: Former Smoker    Packs/day: 3.00    Years: 32.00    Pack years: 96.00    Types: Cigarettes  . Smokeless tobacco: Never Used  Vaping Use  . Vaping Use: Never used  Substance and Sexual Activity  . Alcohol use: No  . Drug use: No  . Sexual activity: Not on file  Other Topics Concern  . Not on file  Social History Narrative  . Not on file   Social Determinants of Health   Financial Resource Strain:   . Difficulty of Paying Living Expenses:   Food Insecurity:   . Worried About Charity fundraiser in the Last Year:   . Arboriculturist in the Last Year:   Transportation Needs:   . Film/video editor (Medical):   Marland Kitchen Lack of Transportation (Non-Medical):   Physical Activity:   . Days of Exercise per Week:   . Minutes of Exercise per Session:   Stress:   . Feeling of Stress :   Social Connections:   . Frequency of Communication with Friends and Family:   . Frequency of Social Gatherings with Friends and Family:   . Attends Religious Services:   . Active Member of Clubs or  Organizations:   . Attends Archivist Meetings:   Marland Kitchen Marital Status:   Intimate Partner Violence:   . Fear of Current or Ex-Partner:   . Emotionally Abused:   Marland Kitchen Physically Abused:   . Sexually Abused:       Review of Systems  Constitutional: Negative.  Negative for chills, fatigue and unexpected weight change.  HENT: Negative.  Negative for congestion, rhinorrhea, sneezing and sore throat.   Eyes: Negative for redness.  Respiratory: Negative.  Negative for  cough, chest tightness and shortness of breath.   Cardiovascular: Negative.  Negative for chest pain and palpitations.  Gastrointestinal: Negative.  Negative for abdominal pain, constipation, diarrhea, nausea and vomiting.  Endocrine: Negative.   Genitourinary: Negative.  Negative for dysuria and frequency.  Musculoskeletal: Negative.  Negative for arthralgias, back pain, joint swelling and neck pain.  Skin: Negative.  Negative for rash.  Allergic/Immunologic: Negative.   Neurological: Negative.  Negative for tremors and numbness.  Hematological: Negative for adenopathy. Does not bruise/bleed easily.  Psychiatric/Behavioral: Negative.  Negative for behavioral problems, sleep disturbance and suicidal ideas. The patient is not nervous/anxious.     Vital Signs: BP (!) 133/51   Pulse 60   Temp (!) 97.5 F (36.4 C)   Resp 16   Ht 5\' 8"  (1.727 m)   Wt 219 lb (99.3 kg)   SpO2 97%   BMI 33.30 kg/m    Physical Exam Vitals and nursing note reviewed.  Constitutional:      General: He is not in acute distress.    Appearance: He is well-developed. He is not diaphoretic.  HENT:     Head: Normocephalic and atraumatic.     Mouth/Throat:     Pharynx: No oropharyngeal exudate.  Eyes:     Pupils: Pupils are equal, round, and reactive to light.  Neck:     Thyroid: No thyromegaly.     Vascular: No JVD.     Trachea: No tracheal deviation.  Cardiovascular:     Rate and Rhythm: Normal rate and regular rhythm.     Heart  sounds: Normal heart sounds. No murmur heard.  No friction rub. No gallop.   Pulmonary:     Effort: Pulmonary effort is normal. No respiratory distress.     Breath sounds: Normal breath sounds. No wheezing or rales.  Chest:     Chest wall: No tenderness.  Abdominal:     Palpations: Abdomen is soft.     Tenderness: There is no abdominal tenderness. There is no guarding.  Musculoskeletal:        General: Normal range of motion.     Cervical back: Normal range of motion and neck supple.  Lymphadenopathy:     Cervical: No cervical adenopathy.  Skin:    General: Skin is warm and dry.  Neurological:     Mental Status: He is alert and oriented to person, place, and time.     Cranial Nerves: No cranial nerve deficit.  Psychiatric:        Behavior: Behavior normal.        Thought Content: Thought content normal.        Judgment: Judgment normal.     Assessment/Plan:  1. Essential hypertension Stable, continue to follow.   2. Type 2 diabetes mellitus with hyperglycemia, without long-term current use of insulin ([place]) Continue to see Endocrinology for management.  Discussed lifestyle modifications and importance of medication compliance.  - POCT HgB A1C  3. OSA on CPAP Continue to use cpap as directed.   4. 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  Risk Modification Advice: She was counseled on portion control guidelines. Restricting daily caloric intake to 1500. The detrimental long term effects of obesity on her health and ongoing poor compliance was also discussed with the patient.   General Counseling: willmer fellers understanding of the findings of todays visit and agrees  with plan of treatment. I have discussed any further diagnostic evaluation that may be needed or ordered today. We also reviewed his  medications today. he has been encouraged to call the office with any questions or concerns that should arise related to todays visit.    Orders Placed This Encounter  Procedures  . POCT HgB A1C    No orders of the defined types were placed in this encounter.   Time spent: 30 Minutes   This patient was seen by Orson Gear AGNP-C in Collaboration with Dr Lavera Guise as a part of collaborative care agreement     Kendell Bane AGNP-C Internal medicine

## 2019-08-22 ENCOUNTER — Encounter: Payer: Self-pay | Admitting: Physical Therapy

## 2019-08-22 ENCOUNTER — Ambulatory Visit: Payer: Medicare Other | Attending: Family Medicine | Admitting: Physical Therapy

## 2019-08-22 ENCOUNTER — Ambulatory Visit: Payer: Medicare Other | Admitting: Physical Therapy

## 2019-08-22 DIAGNOSIS — R262 Difficulty in walking, not elsewhere classified: Secondary | ICD-10-CM | POA: Diagnosis not present

## 2019-08-22 DIAGNOSIS — G8929 Other chronic pain: Secondary | ICD-10-CM | POA: Insufficient documentation

## 2019-08-22 DIAGNOSIS — M545 Low back pain: Secondary | ICD-10-CM | POA: Diagnosis not present

## 2019-08-22 DIAGNOSIS — M5441 Lumbago with sciatica, right side: Secondary | ICD-10-CM | POA: Diagnosis not present

## 2019-08-22 NOTE — Therapy (Signed)
[facility] [address] [place], [address] Phone: [phone]   Fax:  [phone]  Physical Therapy Treatment  Patient Details  Name: Allen [name] MRN: [id number] Date of Birth: [birth year] No data recorded  Encounter Date: 08/22/2019   PT End of Session - 08/22/19 1532    Visit Number 2    Number of Visits 16    Date for PT Re-Evaluation 10/10/19    Authorization - Visit Number 2    Authorization - Number of Visits 10    PT Start Time 5170    PT Stop Time 1530    PT Time Calculation (min) 45 min    Equipment Utilized During Treatment Gait belt    Activity Tolerance Patient tolerated treatment well    Behavior During Therapy [facility] for tasks assessed/performed           Past Medical History:  Diagnosis Date  . Coronary artery disease   . Diabetes mellitus without complication ([place])   . Heart disease   . Hypertension   . MI (myocardial infarction) ([place])   . Sleep apnea     Past Surgical History:  Procedure Laterality Date  . CARDIAC CATHETERIZATION  1989  . CARDIAC CATHETERIZATION  1990  . GALLBLADDER SURGERY    . LEG SURGERY Right    stent placement    There were no vitals filed for this visit.   Subjective Assessment - 08/22/19 1451    Subjective Pt reports 5/10 pain in low back. Compliance with HEP.    Pertinent History Pt is a pleasant 79 y.o. male returning to PT for chronic LBP (>6 years).  Pt has no hx of back trauma, and has received previous PT, injections, and chiropractor tx to address back pain with little success.  Pt reports past PT was very helpful in reducing pain and improving balance deficits.  Pt describes pain in low back as dull ache with intermittent stabbing pain that radiates into BLE R>L (Worts 5/10, Best and Current 1/10).  Pain worsens throughout the day and is aggravated by standing and walking; eases with sitting break, rest, and pain medications.  Pt reports frequent  near falls at least once a day with 1 fall in the past 6 months; increased difficulty with unlevel surfaces, especially grass.  Pt is retired and ADLs encompass household chores and activities.  Pt would like to be able to return to walking around his neighborhood (1 mile) and kayaking.  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  Pt denies N/V, B&B changes, unexplained weight fluctuation, saddle paresthesia, fever, night sweats, or unrelenting night pain at this time.    Limitations Lifting;Standing;House hold activities;Walking    How long can you sit comfortably? Unlimited    How long can you stand comfortably? 10 minutes    How long can you walk comfortably? 2 minutes    Patient Stated Goals Walk 30 minutes without rest break    Currently in Pain? Yes    Pain Score 5     Pain Location Back    Pain Onset More than a month ago               TherEx -6MWT: 732ft with increased LBP 6/10 at 3.5 minutes of amb  -Repeated extension in standing 2x10 with reduction in LBP to 4/10 -Balance assessed using Berg Balance Test: 39/56 with CGA throughout for safety -Sidelying lumbar rotation stretch 2x10 with cueing for  breathing in with stretch and out with return -Glute bridge with RTB 2x8 with cueing to increase hip extension and maintain neutral knee alignment against RTB resistance throughout movement     PT Education - 08/22/19 1455    Education provided Yes    Education Details Therex form/mechanics    Person(s) Educated Patient    Methods Explanation;Demonstration;Verbal cues    Comprehension Verbalized understanding;Returned demonstration;Verbal cues required            PT Short Term Goals - 08/15/19 1618      PT SHORT TERM GOAL #1   Title Pt will be independent with HEP in order to improve strength and decrease back pain in order to improve pain-free function at home and work.     Baseline 08/15/19    Time 4    Period Weeks    Status New    Target Date 09/12/19              PT Long Term Goals - 08/15/19 1620      PT LONG TERM GOAL #1   Title Pt will decrease worst back pain as reported on NPRS by at least 2 points in order to demonstrate clinically significant reduction in back pain.     Baseline 08/15/19 Worst 5/10    Time 8    Period Weeks    Status New      PT LONG TERM GOAL #2   Title Pt will ambulate for 30 minutes on outdoor pavement with LBP 3/10 or less to demonstrate decreased pain with prolonged walking.    Baseline 08/15/19 Pt requires rest break after 2-5 minutes of ambulation d/t LBP    Time 8    Period Weeks    Status New    Target Date 10/10/19      PT LONG TERM GOAL #3   Title Pt will increase 10MWT by at least 0.13 m/s in order to demonstrate clinically significant improvement in community ambulation.     Baseline 08/15/19 0.67 m/s    Time 8    Period Weeks    Status New    Target Date 10/10/19                 Plan - 08/22/19 1538    Clinical Impression Statement PT focused on treatment of low back pain, mobility and strength deficits.  Pt LBP increases after 3.5 minutes of walking (6/10) and decreases with repeated extension in standing (4/10).  R lumbar rotation stretching also makes LBP better per pt report.  Pt requires CGA with balance exercises and has difficulty with balance with decreased BOS and SLS activity; expresses fear of falling, no LOB.  Pt with moderate verbal cueing for proper form wtih therex with decent carry over.  PT will continue progression as able.    Personal Factors and Comorbidities Age;Comorbidity 1;Comorbidity 2;Comorbidity 3+    Comorbidities DM, HTN, overweight    Examination-Activity Limitations Locomotion Level;Caring for Others;Stand;Stairs;Squat;Bend    Examination-Participation Restrictions Art gallery manager;Yard Work;Laundry;Other   Walking outdoors, kayaking   Stability/Clinical Decision Making Evolving/Moderate complexity    Clinical Decision Making Moderate    Rehab  Potential Fair    Clinical Impairments Affecting Rehab Potential Negative: Chronic condition, co-morbidities. Positive: motivated to improve, good results from previous PT.     PT Frequency 2x / week    PT Duration 8 weeks    PT Treatment/Interventions ADLs/Self Care Home Management;Cryotherapy;Electrical Stimulation;Moist Heat;DME Instruction;Traction;Gait training;Stair training;Functional mobility training;Therapeutic activities;Neuromuscular re-education;Balance training;Therapeutic exercise;Patient/family  education;Manual techniques;Passive range of motion;Dry needling;Energy conservation    PT Next Visit Plan Core/hip strengthening, balance training    PT Home Exercise Plan posterior pelvic tilt, lumbar ext, modified tandem standing endurance with counter support    Consulted and Agree with Plan of Care Patient           Patient will benefit from skilled therapeutic intervention in order to improve the following deficits and impairments:  Abnormal gait, Decreased balance, Decreased endurance, Decreased mobility, Difficulty walking, Hypomobility, Impaired sensation, Decreased range of motion, Improper body mechanics, Decreased activity tolerance, Decreased coordination, Decreased strength, Impaired flexibility, Postural dysfunction, Pain  Visit Diagnosis: Chronic midline low back pain with right-sided sciatica  Difficulty in walking, not elsewhere classified  Chronic midline low back pain without sciatica     Problem List Patient Active Problem List   Diagnosis Date Noted  . Need for shingles vaccine 01/22/2019  . Need for vaccination against Streptococcus pneumoniae using pneumococcal conjugate vaccine 7 01/22/2019  . Encounter for general adult medical examination with abnormal findings 01/18/2018  . Abnormal liver function 01/18/2018  . S/P laparoscopic cholecystectomy 01/18/2018  . Bradycardia 01/18/2018  . Cutaneous candidiasis 01/18/2018  . Oral candidiasis 01/18/2018   . Septic shock ([place]) 01/08/2018  . Insect bite of right lower leg 09/25/2017  . Atopic dermatitis 09/25/2017  . PAD (peripheral artery disease) ([place]) 11/24/2015  . Type 2 diabetes mellitus with hyperglycemia, without long-term current use of insulin ([place]) 11/24/2015  . Hyperlipidemia 11/24/2015  . Essential hypertension 11/24/2015  . PVD (peripheral vascular disease) ([place]) 10/29/2015    Durwin Reges DPT Chinita Greenland, SPT Durwin Reges 08/23/2019, 9:43 AM  [facility] [address] [place], [address] Phone: [phone]   Fax:  [phone]  Name: Allen [name] MRN: [id number] Date of Birth: 02-06-1941

## 2019-08-28 ENCOUNTER — Other Ambulatory Visit: Payer: Self-pay

## 2019-08-28 ENCOUNTER — Encounter: Payer: Self-pay | Admitting: Physical Therapy

## 2019-08-28 ENCOUNTER — Ambulatory Visit: Payer: Medicare Other | Admitting: Physical Therapy

## 2019-08-28 DIAGNOSIS — G8929 Other chronic pain: Secondary | ICD-10-CM | POA: Diagnosis not present

## 2019-08-28 DIAGNOSIS — M545 Low back pain, unspecified: Secondary | ICD-10-CM

## 2019-08-28 DIAGNOSIS — R262 Difficulty in walking, not elsewhere classified: Secondary | ICD-10-CM

## 2019-08-28 DIAGNOSIS — M5441 Lumbago with sciatica, right side: Secondary | ICD-10-CM | POA: Diagnosis not present

## 2019-08-28 NOTE — Therapy (Signed)
[facility] [address], [place], [address] Phone: [phone]   Fax:  [phone]  Physical Therapy Treatment  Patient Details  Name: Allen [name] MRN: [id number] Date of Birth: 05-Apr-1940 No data recorded  Encounter Date: 08/28/2019   PT End of Session - 08/28/19 1437    Visit Number 3    Number of Visits 16    Date for PT Re-Evaluation 10/10/19    Authorization - Visit Number 3    Authorization - Number of Visits 10    PT Start Time 1430    PT Stop Time 1515    PT Time Calculation (min) 45 min    Equipment Utilized During Treatment Gait belt    Activity Tolerance Patient tolerated treatment well    Behavior During Therapy WFL for tasks assessed/performed           Past Medical History:  Diagnosis Date  . Coronary artery disease   . Diabetes mellitus without complication ([place])   . Heart disease   . Hypertension   . MI (myocardial infarction) ([place])   . Sleep apnea     Past Surgical History:  Procedure Laterality Date  . CARDIAC CATHETERIZATION  1989  . CARDIAC CATHETERIZATION  1990  . GALLBLADDER SURGERY    . LEG SURGERY Right    stent placement    There were no vitals filed for this visit.   Subjective Assessment - 08/28/19 1435    Subjective Pt reports 5/10 pain in low back, no reports of radiating pain. Compliance with HEP.    Pertinent History Pt is a pleasant 79 y.o. male returning to PT for chronic LBP (>6 years).  Pt has no hx of back trauma, and has received previous PT, injections, and chiropractor tx to address back pain with little success.  Pt reports past PT was very helpful in reducing pain and improving balance deficits.  Pt describes pain in low back as dull ache with intermittent stabbing pain that radiates into BLE R>L (Worts 5/10, Best and Current 1/10).  Pain worsens throughout the day and is aggravated by standing and walking; eases with sitting break, rest, and pain  medications.  Pt reports frequent near falls at least once a day with 1 fall in the past 6 months; increased difficulty with unlevel surfaces, especially grass.  Pt is retired and ADLs encompass household chores and activities.  Pt would like to be able to return to walking around his neighborhood (1 mile) and kayaking.  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  Pt denies N/V, B&B changes, unexplained weight fluctuation, saddle paresthesia, fever, night sweats, or unrelenting night pain at this time.    Limitations Lifting;Standing;House hold activities;Walking    How long can you sit comfortably? Unlimited    How long can you stand comfortably? 10 minutes    How long can you walk comfortably? 2 minutes    Patient Stated Goals Walk 30 minutes without rest break    Currently in Pain? Yes    Pain Score 5     Pain Location Back    Pain Onset More than a month ago           TherEx -Nustep L3 x16min, seat setting 9 with no UE for low level LE strengthening/endurance -Glute bridge x10 with cueing for glute and core activation with good carry over -Glute bridge 2x10 with GTB  -Supine table top isometric hold x10 with 3  sec hold -Supine table top isometric hold with UE resistance x10 with 3 sec hold increased LBP with no radiating sx that reduces with 10 repeated extensions, modified exercise to isometric hold with tb x10 with 3 sec hold to decrease LBP with activity with good success and no increase in pain -Sit to stand x10 from elevated seat height, 2x10 from standard seat height, with cueing for hip hinge and neutral knee alignment to reduce knee valgus with good carry over -Sidelying R trunk rotation 3x30sec with straightened LLE on last stretch and PT assisted R knee depression to table to increase stretch -Gait in clinic 375ft with 3 bouts of 60ft of cone weaving for dynamic balance with gait; with PT supervision for safety, no LOB    PT Education - 08/28/19 1437    Education  provided Yes    Education Details Therex form/mechanics    Person(s) Educated Patient    Methods Explanation;Demonstration;Verbal cues    Comprehension Verbalized understanding;Returned demonstration            PT Short Term Goals - 08/15/19 1618      PT SHORT TERM GOAL #1   Title Pt will be independent with HEP in order to improve strength and decrease back pain in order to improve pain-free function at home and work.     Baseline 08/15/19    Time 4    Period Weeks    Status New    Target Date 09/12/19             PT Long Term Goals - 08/15/19 1620      PT LONG TERM GOAL #1   Title Pt will decrease worst back pain as reported on NPRS by at least 2 points in order to demonstrate clinically significant reduction in back pain.     Baseline 08/15/19 Worst 5/10    Time 8    Period Weeks    Status New      PT LONG TERM GOAL #2   Title Pt will ambulate for 30 minutes on outdoor pavement with LBP 3/10 or less to demonstrate decreased pain with prolonged walking.    Baseline 08/15/19 Pt requires rest break after 2-5 minutes of ambulation d/t LBP    Time 8    Period Weeks    Status New    Target Date 10/10/19      PT LONG TERM GOAL #3   Title Pt will increase 10MWT by at least 0.13 m/s in order to demonstrate clinically significant improvement in community ambulation.     Baseline 08/15/19 0.67 m/s    Time 8    Period Weeks    Status New    Target Date 10/10/19                 Plan - 08/28/19 1649    Clinical Impression Statement PT continued hip/core strengthening and gait/balance progression with good success.  Pt LBP increases with supine core strengthening exercise with no reported radiating sx that subsides with repeated extensions in standing; no increased pain with modified supine core strengthening post.  Pt with good carry over of verbal cues for hip/core strengthening.  Pt reports decreased back pain (3-4/10) post R lumbar rotation stretching.  Pt able to amb  328ft with obstacle maneuver with no increase in pain or balance errors.  PT will continue progression as able.    Personal Factors and Comorbidities Age;Comorbidity 1;Comorbidity 2;Comorbidity 3+    Comorbidities DM, HTN, overweight  Examination-Activity Limitations Patent attorney for Others;Stand;Stairs;Squat;Bend    Psychologist, clinical;Yard Work;Laundry;Other   Walking outdoors, kayaking   Stability/Clinical Decision Making Evolving/Moderate complexity    Clinical Decision Making Moderate    Rehab Potential Fair    Clinical Impairments Affecting Rehab Potential Negative: Chronic condition, co-morbidities. Positive: motivated to improve, good results from previous PT.     PT Frequency 2x / week    PT Duration 8 weeks    PT Treatment/Interventions ADLs/Self Care Home Management;Cryotherapy;Electrical Stimulation;Moist Heat;DME Instruction;Traction;Gait training;Stair training;Functional mobility training;Therapeutic activities;Neuromuscular re-education;Balance training;Therapeutic exercise;Patient/family education;Manual techniques;Passive range of motion;Dry needling;Energy conservation    PT Next Visit Plan Progress core/hip strengthening, balance training    PT Home Exercise Plan posterior pelvic tilt, lumbar ext, modified tandem standing endurance with counter support    Consulted and Agree with Plan of Care Patient           Patient will benefit from skilled therapeutic intervention in order to improve the following deficits and impairments:  Abnormal gait, Decreased balance, Decreased endurance, Decreased mobility, Difficulty walking, Hypomobility, Impaired sensation, Decreased range of motion, Improper body mechanics, Decreased activity tolerance, Decreased coordination, Decreased strength, Impaired flexibility, Postural dysfunction, Pain  Visit Diagnosis: Chronic midline low back pain with right-sided sciatica  Difficulty in  walking, not elsewhere classified  Chronic midline low back pain without sciatica     Problem List Patient Active Problem List   Diagnosis Date Noted  . Need for shingles vaccine 01/22/2019  . Need for vaccination against Streptococcus pneumoniae using pneumococcal conjugate vaccine 7 01/22/2019  . Encounter for general adult medical examination with abnormal findings 01/18/2018  . Abnormal liver function 01/18/2018  . S/P laparoscopic cholecystectomy 01/18/2018  . Bradycardia 01/18/2018  . Cutaneous candidiasis 01/18/2018  . Oral candidiasis 01/18/2018  . Septic shock ([place]) 01/08/2018  . Insect bite of right lower leg 09/25/2017  . Atopic dermatitis 09/25/2017  . PAD (peripheral artery disease) ([place]) 11/24/2015  . Type 2 diabetes mellitus with hyperglycemia, without long-term current use of insulin ([place]) 11/24/2015  . Hyperlipidemia 11/24/2015  . Essential hypertension 11/24/2015  . PVD (peripheral vascular disease) ([place]) 10/29/2015   Durwin Reges DPT Chinita Greenland, SPT Durwin Reges 08/28/2019, 5:12 PM  [facility] [address], [place], [address] Phone: [phone]   Fax:  [phone]  Name: Allen [name] MRN: [id number] Date of Birth: Sep 19, 1940

## 2019-08-30 ENCOUNTER — Other Ambulatory Visit: Payer: Self-pay

## 2019-08-30 ENCOUNTER — Ambulatory Visit: Payer: Medicare Other | Admitting: Physical Therapy

## 2019-08-30 ENCOUNTER — Encounter: Payer: Self-pay | Admitting: Physical Therapy

## 2019-08-30 DIAGNOSIS — R262 Difficulty in walking, not elsewhere classified: Secondary | ICD-10-CM | POA: Diagnosis not present

## 2019-08-30 DIAGNOSIS — M545 Low back pain, unspecified: Secondary | ICD-10-CM

## 2019-08-30 DIAGNOSIS — M5441 Lumbago with sciatica, right side: Secondary | ICD-10-CM | POA: Diagnosis not present

## 2019-08-30 DIAGNOSIS — G8929 Other chronic pain: Secondary | ICD-10-CM | POA: Diagnosis not present

## 2019-08-30 NOTE — Therapy (Signed)
[facility] [address] [place], [address] Phone: [phone]   Fax:  [phone]  Physical Therapy Treatment  Patient Details  Name: Allen [name] MRN: [id number] Date of Birth: 1940-07-18 No data recorded  Encounter Date: 08/30/2019   PT End of Session - 08/30/19 1522    Visit Number 4    Number of Visits 16    Date for PT Re-Evaluation 10/10/19    Authorization - Visit Number 4    Authorization - Number of Visits 10    PT Start Time 0737    PT Stop Time 1600    PT Time Calculation (min) 45 min    Equipment Utilized During Treatment Gait belt    Activity Tolerance Patient tolerated treatment well    Behavior During Therapy WFL for tasks assessed/performed           Past Medical History:  Diagnosis Date  . Coronary artery disease   . Diabetes mellitus without complication ([place])   . Heart disease   . Hypertension   . MI (myocardial infarction) ([place])   . Sleep apnea     Past Surgical History:  Procedure Laterality Date  . CARDIAC CATHETERIZATION  1989  . CARDIAC CATHETERIZATION  1990  . GALLBLADDER SURGERY    . LEG SURGERY Right    stent placement    There were no vitals filed for this visit.   Subjective Assessment - 08/30/19 1517    Subjective Pt reports 6-7/10 pain in low back, pt thinks it is d/t the poor weather yesterday and inactivity.    Pertinent History Pt is a pleasant 79 y.o. male returning to PT for chronic LBP (>6 years).  Pt has no hx of back trauma, and has received previous PT, injections, and chiropractor tx to address back pain with little success.  Pt reports past PT was very helpful in reducing pain and improving balance deficits.  Pt describes pain in low back as dull ache with intermittent stabbing pain that radiates into BLE R>L (Worts 5/10, Best and Current 1/10).  Pain worsens throughout the day and is aggravated by standing and walking; eases with sitting break, rest,  and pain medications.  Pt reports frequent near falls at least once a day with 1 fall in the past 6 months; increased difficulty with unlevel surfaces, especially grass.  Pt is retired and ADLs encompass household chores and activities.  Pt would like to be able to return to walking around his neighborhood (1 mile) and kayaking.  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  Pt denies N/V, B&B changes, unexplained weight fluctuation, saddle paresthesia, fever, night sweats, or unrelenting night pain at this time.    Limitations Lifting;Standing;House hold activities;Walking    How long can you sit comfortably? Unlimited    How long can you stand comfortably? 10 minutes    How long can you walk comfortably? 2 minutes    Patient Stated Goals Walk 30 minutes without rest break    Currently in Pain? Yes    Pain Score 7     Pain Onset More than a month ago           TherEx -Nustep L3 x61min, seat setting 9 with no UE for low level LE strengthening -Sidelying R trunk rotation 2x39min with straightened LLE on last stretch and PT assisted R knee depression to table to increase stretch -Glute bridge 3x10 with GTB with reports of decreased  stiffness with increased reps -Sit to stand 2x10 from standard seat height, with cueing for neutral knee alignment to reduce knee valgus with good carry over -Supine deadbug isometric hold with tb squeeze 2x10 with 3 sec hold with cueing for core activation, diastasis rectus noted and mild increase in LBP during second set -Standing repeated extensions x10 with improved LBP post -Gait 85ft with stepping over 6x 3" hurdles x4 -Gait 60ft with stepping over 6x 3" hurdles onto foam x2 with no increase in LBP     PT Education - 08/30/19 1519    Education provided Yes    Education Details Therex form/mechanics; importance of daily movement/walking    Person(s) Educated Patient    Methods Explanation;Demonstration;Verbal cues    Comprehension Verbalized  understanding;Returned demonstration            PT Short Term Goals - 08/15/19 1618      PT SHORT TERM GOAL #1   Title Pt will be independent with HEP in order to improve strength and decrease back pain in order to improve pain-free function at home and work.     Baseline 08/15/19    Time 4    Period Weeks    Status New    Target Date 09/12/19             PT Long Term Goals - 08/15/19 1620      PT LONG TERM GOAL #1   Title Pt will decrease worst back pain as reported on NPRS by at least 2 points in order to demonstrate clinically significant reduction in back pain.     Baseline 08/15/19 Worst 5/10    Time 8    Period Weeks    Status New      PT LONG TERM GOAL #2   Title Pt will ambulate for 30 minutes on outdoor pavement with LBP 3/10 or less to demonstrate decreased pain with prolonged walking.    Baseline 08/15/19 Pt requires rest break after 2-5 minutes of ambulation d/t LBP    Time 8    Period Weeks    Status New    Target Date 10/10/19      PT LONG TERM GOAL #3   Title Pt will increase 10MWT by at least 0.13 m/s in order to demonstrate clinically significant improvement in community ambulation.     Baseline 08/15/19 0.67 m/s    Time 8    Period Weeks    Status New    Target Date 10/10/19                 Plan - 08/30/19 1537    Clinical Impression Statement PT continued hip/core strengthening and gait/balance progression with good success.  Pt with difficulty laying in supine for prolonged periods of time d/t difficulty breathing, but pt tolerates supine therex with intermitting sitting breaks and sitting/standing exercises.  Pt able to reduce flares of back pain with standing repeated extensions with good success.  Pt requires supervision-CGA with all balance/gait therex for safety, no LOB. PT will continue progression as able.    Personal Factors and Comorbidities Age;Comorbidity 1;Comorbidity 2;Comorbidity 3+    Comorbidities DM, HTN, overweight     Examination-Activity Limitations Locomotion Level;Caring for Others;Stand;Stairs;Squat;Bend    Examination-Participation Restrictions Art gallery manager;Yard Work;Laundry;Other   Walking outdoors, kayaking   Stability/Clinical Decision Making Evolving/Moderate complexity    Clinical Decision Making Moderate    Rehab Potential Fair    Clinical Impairments Affecting Rehab Potential Negative: Chronic condition, co-morbidities. Positive: motivated  to improve, good results from previous PT.     PT Frequency 2x / week    PT Duration 8 weeks    PT Treatment/Interventions ADLs/Self Care Home Management;Cryotherapy;Electrical Stimulation;Moist Heat;DME Instruction;Traction;Gait training;Stair training;Functional mobility training;Therapeutic activities;Neuromuscular re-education;Balance training;Therapeutic exercise;Patient/family education;Manual techniques;Passive range of motion;Dry needling;Energy conservation    PT Next Visit Plan Progress core/hip strengthening, balance training    PT Home Exercise Plan posterior pelvic tilt, lumbar ext, modified tandem standing endurance with counter support    Consulted and Agree with Plan of Care Patient           Patient will benefit from skilled therapeutic intervention in order to improve the following deficits and impairments:  Abnormal gait, Decreased balance, Decreased endurance, Decreased mobility, Difficulty walking, Hypomobility, Impaired sensation, Decreased range of motion, Improper body mechanics, Decreased activity tolerance, Decreased coordination, Decreased strength, Impaired flexibility, Postural dysfunction, Pain  Visit Diagnosis: Chronic midline low back pain with right-sided sciatica  Difficulty in walking, not elsewhere classified  Chronic midline low back pain without sciatica     Problem List Patient Active Problem List   Diagnosis Date Noted  . Need for shingles vaccine 01/22/2019  . Need for vaccination against  Streptococcus pneumoniae using pneumococcal conjugate vaccine 7 01/22/2019  . Encounter for general adult medical examination with abnormal findings 01/18/2018  . Abnormal liver function 01/18/2018  . S/P laparoscopic cholecystectomy 01/18/2018  . Bradycardia 01/18/2018  . Cutaneous candidiasis 01/18/2018  . Oral candidiasis 01/18/2018  . Septic shock ([place]) 01/08/2018  . Insect bite of right lower leg 09/25/2017  . Atopic dermatitis 09/25/2017  . PAD (peripheral artery disease) ([place]) 11/24/2015  . Type 2 diabetes mellitus with hyperglycemia, without long-term current use of insulin ([place]) 11/24/2015  . Hyperlipidemia 11/24/2015  . Essential hypertension 11/24/2015  . PVD (peripheral vascular disease) ([place]) 10/29/2015    Allen [name] DPT Allen [name], SPT Allen [name] 08/30/2019, 5:48 PM  [facility] [address] [place], [address] Phone: [phone]   Fax:  [phone]  Name: Allen [name] MRN: [id number] Date of Birth: 1940/05/30

## 2019-08-30 NOTE — Addendum Note (Signed)
Addended by: Kelton Pillar on: 08/30/2019 04:48 PM   Modules accepted: Orders

## 2019-09-03 ENCOUNTER — Ambulatory Visit: Payer: Medicare Other | Admitting: Physical Therapy

## 2019-09-03 ENCOUNTER — Encounter: Payer: Self-pay | Admitting: Physical Therapy

## 2019-09-03 ENCOUNTER — Other Ambulatory Visit: Payer: Self-pay

## 2019-09-03 DIAGNOSIS — R262 Difficulty in walking, not elsewhere classified: Secondary | ICD-10-CM

## 2019-09-03 DIAGNOSIS — M5441 Lumbago with sciatica, right side: Secondary | ICD-10-CM | POA: Diagnosis not present

## 2019-09-03 DIAGNOSIS — G8929 Other chronic pain: Secondary | ICD-10-CM | POA: Diagnosis not present

## 2019-09-03 DIAGNOSIS — M545 Low back pain: Secondary | ICD-10-CM | POA: Diagnosis not present

## 2019-09-03 NOTE — Therapy (Signed)
[facility] [address], [place], [address] Phone: [phone]   Fax:  [phone]  Physical Therapy Treatment  Patient Details  Name: Allen [name] MRN: [id number] Date of Birth: September 24, 1940 No data recorded  Encounter Date: 09/03/2019   PT End of Session - 09/03/19 0951    Visit Number 5    Number of Visits 16    Date for PT Re-Evaluation 10/10/19    Authorization - Visit Number 5    Authorization - Number of Visits 10    PT Start Time 0950    PT Stop Time 1030    PT Time Calculation (min) 40 min    Equipment Utilized During Treatment Gait belt    Activity Tolerance Patient tolerated treatment well    Behavior During Therapy WFL for tasks assessed/performed           Past Medical History:  Diagnosis Date  . Coronary artery disease   . Diabetes mellitus without complication ([place])   . Heart disease   . Hypertension   . MI (myocardial infarction) ([place])   . Sleep apnea     Past Surgical History:  Procedure Laterality Date  . CARDIAC CATHETERIZATION  1989  . CARDIAC CATHETERIZATION  1990  . GALLBLADDER SURGERY    . LEG SURGERY Right    stent placement    There were no vitals filed for this visit.   Subjective Assessment - 09/03/19 0949    Subjective Pt reports 4-5/10 LBP this morning, stating that he has not really gotten moving much this morning so he is stiff.  Pt reports he did not do his exercises over the weekend d/t being busy.    Pertinent History Pt is a pleasant 79 y.o. male returning to PT for chronic LBP (>6 years).  Pt has no hx of back trauma, and has received previous PT, injections, and chiropractor tx to address back pain with little success.  Pt reports past PT was very helpful in reducing pain and improving balance deficits.  Pt describes pain in low back as dull ache with intermittent stabbing pain that radiates into BLE R>L (Worts 5/10, Best and Current 1/10).  Pain worsens  throughout the day and is aggravated by standing and walking; eases with sitting break, rest, and pain medications.  Pt reports frequent near falls at least once a day with 1 fall in the past 6 months; increased difficulty with unlevel surfaces, especially grass.  Pt is retired and ADLs encompass household chores and activities.  Pt would like to be able to return to walking around his neighborhood (1 mile) and kayaking.  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  Pt denies N/V, B&B changes, unexplained weight fluctuation, saddle paresthesia, fever, night sweats, or unrelenting night pain at this time.    Limitations Lifting;Standing;House hold activities;Walking    How long can you sit comfortably? Unlimited    How long can you stand comfortably? 10 minutes    How long can you walk comfortably? 2 minutes    Patient Stated Goals Walk 30 minutes without rest break    Currently in Pain? Yes    Pain Score 5     Pain Onset More than a month ago           TherEx -Nustep L3 x29min, seat setting 9 with no UE for low level LE strengthening -Glute bridge 3x10 with GTBwith reports of decreased stiffness with increased reps -Supine  dead bug 3x10 with cueing for core activation; diastasis rectus evident, cueing to reduce breath holding -Sit to stand 2x10 with GTB from standard seat height, with cueing for neutral knee alignment to reduce knee valgus with good carry over -Gait 121ft in clinic with [facility] and supervision for safety -Alternating stair taps on 6" stair 3x10 -Alternating star taps anterior/across midline for increased time in SLS on 6" stair 2x10 -Standing repeated extensions x10 with reduction in LBP following  -Alternating stair taps on foam, anterior/across midline x10 -Lateral step over 6' hurdle 2x10      PT Education - 09/03/19 0951    Education provided Yes    Education Details Therex form/mechanics    Person(s) Educated Patient    Methods Explanation;Demonstration;Verbal  cues    Comprehension Verbalized understanding;Returned demonstration            PT Short Term Goals - 08/15/19 1618      PT SHORT TERM GOAL #1   Title Pt will be independent with HEP in order to improve strength and decrease back pain in order to improve pain-free function at home and work.     Baseline 08/15/19    Time 4    Period Weeks    Status New    Target Date 09/12/19             PT Long Term Goals - 08/15/19 1620      PT LONG TERM GOAL #1   Title Pt will decrease worst back pain as reported on NPRS by at least 2 points in order to demonstrate clinically significant reduction in back pain.     Baseline 08/15/19 Worst 5/10    Time 8    Period Weeks    Status New      PT LONG TERM GOAL #2   Title Pt will ambulate for 30 minutes on outdoor pavement with LBP 3/10 or less to demonstrate decreased pain with prolonged walking.    Baseline 08/15/19 Pt requires rest break after 2-5 minutes of ambulation d/t LBP    Time 8    Period Weeks    Status New    Target Date 10/10/19      PT LONG TERM GOAL #3   Title Pt will increase 10MWT by at least 0.13 m/s in order to demonstrate clinically significant improvement in community ambulation.     Baseline 08/15/19 0.67 m/s    Time 8    Period Weeks    Status New    Target Date 10/10/19                 Plan - 09/03/19 1008    Clinical Impression Statement PT continued hip/core strengthening and gait/balance progression with good success.  Pt with good carry over of cueing with therex and reduced reported stiffness with continued movement.  Minimal increase in back pain with therex that reduces with repeated extensions in standing.  Pt requires CGA for balance activites for safety with no LOB; pt with increased difficulty with L SLS activities in comparison to the R.  PT will continue progression as able.    Personal Factors and Comorbidities Age;Comorbidity 1;Comorbidity 2;Comorbidity 3+    Comorbidities DM, HTN, overweight     Examination-Activity Limitations Locomotion Level;Caring for Others;Stand;Stairs;Squat;Bend    Examination-Participation Restrictions Art gallery manager;Yard Work;Laundry;Other   Walking outdoors, kayaking   Stability/Clinical Decision Making Evolving/Moderate complexity    Clinical Decision Making Moderate    Rehab Potential Fair    Clinical Impairments Affecting  Rehab Potential Negative: Chronic condition, co-morbidities. Positive: motivated to improve, good results from previous PT.     PT Frequency 2x / week    PT Duration 8 weeks    PT Treatment/Interventions ADLs/Self Care Home Management;Cryotherapy;Electrical Stimulation;Moist Heat;DME Instruction;Traction;Gait training;Stair training;Functional mobility training;Therapeutic activities;Neuromuscular re-education;Balance training;Therapeutic exercise;Patient/family education;Manual techniques;Passive range of motion;Dry needling;Energy conservation    PT Next Visit Plan Trunk rotation core strengthening on tball, dynamic balance, tandem gait    PT Home Exercise Plan posterior pelvic tilt, lumbar ext, modified tandem standing endurance with counter support    Consulted and Agree with Plan of Care Patient           Patient will benefit from skilled therapeutic intervention in order to improve the following deficits and impairments:  Abnormal gait, Decreased balance, Decreased endurance, Decreased mobility, Difficulty walking, Hypomobility, Impaired sensation, Decreased range of motion, Improper body mechanics, Decreased activity tolerance, Decreased coordination, Decreased strength, Impaired flexibility, Postural dysfunction, Pain  Visit Diagnosis: Chronic midline low back pain with right-sided sciatica  Difficulty in walking, not elsewhere classified  Chronic midline low back pain without sciatica     Problem List Patient Active Problem List   Diagnosis Date Noted  . Need for shingles vaccine 01/22/2019  . Need for  vaccination against Streptococcus pneumoniae using pneumococcal conjugate vaccine 7 01/22/2019  . Encounter for general adult medical examination with abnormal findings 01/18/2018  . Abnormal liver function 01/18/2018  . S/P laparoscopic cholecystectomy 01/18/2018  . Bradycardia 01/18/2018  . Cutaneous candidiasis 01/18/2018  . Oral candidiasis 01/18/2018  . Septic shock ([place]) 01/08/2018  . Insect bite of right lower leg 09/25/2017  . Atopic dermatitis 09/25/2017  . PAD (peripheral artery disease) ([place]) 11/24/2015  . Type 2 diabetes mellitus with hyperglycemia, without long-term current use of insulin ([place]) 11/24/2015  . Hyperlipidemia 11/24/2015  . Essential hypertension 11/24/2015  . PVD (peripheral vascular disease) ([place]) 10/29/2015   Durwin Reges DPT Chinita Greenland, SPT Durwin Reges 09/03/2019, 2:10 PM  [facility] [address], [place], [address] Phone: [phone]   Fax:  [phone]  Name: Allen [name] MRN: [id number] Date of Birth: 12-22-1940

## 2019-09-04 ENCOUNTER — Other Ambulatory Visit: Payer: Self-pay

## 2019-09-04 MED ORDER — EZETIMIBE-SIMVASTATIN 10-40 MG PO TABS: 1.0000 | ORAL_TABLET | Freq: Every day | ORAL | 1 refills | Status: DC

## 2019-09-05 ENCOUNTER — Other Ambulatory Visit: Payer: Self-pay

## 2019-09-05 ENCOUNTER — Ambulatory Visit: Payer: Medicare Other | Admitting: Physical Therapy

## 2019-09-05 ENCOUNTER — Encounter: Payer: Self-pay | Admitting: Physical Therapy

## 2019-09-05 DIAGNOSIS — R262 Difficulty in walking, not elsewhere classified: Secondary | ICD-10-CM

## 2019-09-05 DIAGNOSIS — M545 Low back pain, unspecified: Secondary | ICD-10-CM

## 2019-09-05 DIAGNOSIS — G8929 Other chronic pain: Secondary | ICD-10-CM

## 2019-09-05 DIAGNOSIS — M5441 Lumbago with sciatica, right side: Secondary | ICD-10-CM | POA: Diagnosis not present

## 2019-09-05 NOTE — Therapy (Signed)
[facility] [address] [place], [address] Phone: [phone]   Fax:  [phone]  Physical Therapy Treatment  Patient Details  Name: Allen [name] MRN: [id number] Date of Birth: 04/20/1940 No data recorded  Encounter Date: 09/05/2019   PT End of Session - 09/05/19 0908    Visit Number 6    Number of Visits 16    Date for PT Re-Evaluation 10/10/19    Authorization Type medicare    Authorization Time Period --    Authorization - Visit Number 5    Authorization - Number of Visits 10    PT Start Time 0900    PT Stop Time 0940    PT Time Calculation (min) 40 min    Equipment Utilized During Treatment Gait belt    Activity Tolerance Patient tolerated treatment well    Behavior During Therapy WFL for tasks assessed/performed           Past Medical History:  Diagnosis Date  . Coronary artery disease   . Diabetes mellitus without complication ([place])   . Heart disease   . Hypertension   . MI (myocardial infarction) ([place])   . Sleep apnea     Past Surgical History:  Procedure Laterality Date  . CARDIAC CATHETERIZATION  1989  . CARDIAC CATHETERIZATION  1990  . GALLBLADDER SURGERY    . LEG SURGERY Right    stent placement    There were no vitals filed for this visit.   Subjective Assessment - 09/05/19 0905    Subjective Pt reports he is tired this morning but that his back is feeling pretty good. Compliance with HEP.    Pertinent History Pt is a pleasant 79 y.o. male returning to PT for chronic LBP (>6 years).  Pt has no hx of back trauma, and has received previous PT, injections, and chiropractor tx to address back pain with little success.  Pt reports past PT was very helpful in reducing pain and improving balance deficits.  Pt describes pain in low back as dull ache with intermittent stabbing pain that radiates into BLE R>L (Worts 5/10, Best and Current 1/10).  Pain worsens throughout the day and is  aggravated by standing and walking; eases with sitting break, rest, and pain medications.  Pt reports frequent near falls at least once a day with 1 fall in the past 6 months; increased difficulty with unlevel surfaces, especially grass.  Pt is retired and ADLs encompass household chores and activities.  Pt would like to be able to return to walking around his neighborhood (1 mile) and kayaking.  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  Pt denies N/V, B&B changes, unexplained weight fluctuation, saddle paresthesia, fever, night sweats, or unrelenting night pain at this time.    Limitations Lifting;Standing;House hold activities;Walking    How long can you sit comfortably? Unlimited    How long can you stand comfortably? 10 minutes    How long can you walk comfortably? 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    Patient Stated Goals Walk 30 minutes without rest break    Pain Location Heel    Pain Onset More than a month ago    Pain Onset More than a month ago             TherEx -Nustep L3 x34min, seat  setting 9 with no UE for low level LEstrengthening -Glute bridge 3x10 with GTBwith glute set prior utilized to achieve glute activation with patient reporting this is difficult to achieve  - Prone alt hip ext 3x 10 with heavy cuing to prevent lumbar rotation with decent carry over, minimal hip ext ROM - Qped alt hip ext 2x 10 with cone on back for TC to prevent rotation with decent carry over -Modified thomas stretch 30sec bilat increased "stretch felt" on RLE - Squat with mat table TC for "light touch" 3x 10 with cuing initially for LE alignment and glute activation with good carry over - Hip hike 2x 10 bilat with demo and heavy cuing needed for proper hike with good carry over                         PT Education - 09/05/19 0907    Education provided Yes     Education Details therex form/technique    Person(s) Educated Patient    Methods Explanation;Demonstration;Verbal cues    Comprehension Verbalized understanding;Returned demonstration;Verbal cues required            PT Short Term Goals - 08/15/19 1618      PT SHORT TERM GOAL #1   Title Pt will be independent with HEP in order to improve strength and decrease back pain in order to improve pain-free function at home and work.     Baseline 08/15/19    Time 4    Period Weeks    Status New    Target Date 09/12/19             PT Long Term Goals - 08/15/19 1620      PT LONG TERM GOAL #1   Title Pt will decrease worst back pain as reported on NPRS by at least 2 points in order to demonstrate clinically significant reduction in back pain.     Baseline 08/15/19 Worst 5/10    Time 8    Period Weeks    Status New      PT LONG TERM GOAL #2   Title Pt will ambulate for 30 minutes on outdoor pavement with LBP 3/10 or less to demonstrate decreased pain with prolonged walking.    Baseline 08/15/19 Pt requires rest break after 2-5 minutes of ambulation d/t LBP    Time 8    Period Weeks    Status New    Target Date 10/10/19      PT LONG TERM GOAL #3   Title Pt will increase 10MWT by at least 0.13 m/s in order to demonstrate clinically significant improvement in community ambulation.     Baseline 08/15/19 0.67 m/s    Time 8    Period Weeks    Status New    Target Date 10/10/19                 Plan - 09/05/19 0935    Clinical Impression Statement PT continued therex progression for increased hip/lumbar ext mobility and strength, and core stabilizaiton with good success. Paient is able to comply with all cuing for proper technique and muscle activation with good motivation throughout session. PT will continue progression as able.    Personal Factors and Comorbidities Age;Comorbidity 1;Comorbidity 2;Comorbidity 3+    Comorbidities DM, HTN, overweight    Examination-Activity  Limitations Locomotion Level;Caring for Others;Stand;Stairs;Squat;Bend    Examination-Participation Restrictions Art gallery manager;Yard Work;Laundry;Other    Stability/Clinical Decision Making Evolving/Moderate complexity  Clinical Decision Making Moderate    Rehab Potential Fair    Clinical Impairments Affecting Rehab Potential Negative: Chronic condition, co-morbidities. Positive: motivated to improve, good results from previous PT.     PT Frequency 2x / week    PT Duration 8 weeks    PT Treatment/Interventions ADLs/Self Care Home Management;Cryotherapy;Electrical Stimulation;Moist Heat;DME Instruction;Traction;Gait training;Stair training;Functional mobility training;Therapeutic activities;Neuromuscular re-education;Balance training;Therapeutic exercise;Patient/family education;Manual techniques;Passive range of motion;Dry needling;Energy conservation    PT Next Visit Plan Trunk rotation core strengthening on tball, dynamic balance, tandem gait    PT Home Exercise Plan posterior pelvic tilt, lumbar ext, modified tandem standing endurance with counter support    Consulted and Agree with Plan of Care Patient           Patient will benefit from skilled therapeutic intervention in order to improve the following deficits and impairments:  Abnormal gait, Decreased balance, Decreased endurance, Decreased mobility, Difficulty walking, Hypomobility, Impaired sensation, Decreased range of motion, Improper body mechanics, Decreased activity tolerance, Decreased coordination, Decreased strength, Impaired flexibility, Postural dysfunction, Pain  Visit Diagnosis: Chronic midline low back pain with right-sided sciatica  Difficulty in walking, not elsewhere classified  Chronic midline low back pain without sciatica     Problem List Patient Active Problem List   Diagnosis Date Noted  . Need for shingles vaccine 01/22/2019  . Need for vaccination against Streptococcus pneumoniae using  pneumococcal conjugate vaccine 7 01/22/2019  . Encounter for general adult medical examination with abnormal findings 01/18/2018  . Abnormal liver function 01/18/2018  . S/P laparoscopic cholecystectomy 01/18/2018  . Bradycardia 01/18/2018  . Cutaneous candidiasis 01/18/2018  . Oral candidiasis 01/18/2018  . Septic shock ([place]) 01/08/2018  . Insect bite of right lower leg 09/25/2017  . Atopic dermatitis 09/25/2017  . PAD (peripheral artery disease) ([place]) 11/24/2015  . Type 2 diabetes mellitus with hyperglycemia, without long-term current use of insulin ([place]) 11/24/2015  . Hyperlipidemia 11/24/2015  . Essential hypertension 11/24/2015  . PVD (peripheral vascular disease) ([place]) 10/29/2015   Durwin Reges DPT Durwin Reges 09/05/2019, 9:41 AM  [facility] [address], [place], [address] Phone: [phone]   Fax:  [phone]  Name: Allen [name] MRN: [id number] Date of Birth: 06/05/1940

## 2019-09-11 ENCOUNTER — Encounter: Payer: Medicare Other | Admitting: Physical Therapy

## 2019-09-11 ENCOUNTER — Ambulatory Visit: Payer: Medicare Other | Admitting: Physical Therapy

## 2019-09-13 ENCOUNTER — Other Ambulatory Visit: Payer: Self-pay

## 2019-09-13 ENCOUNTER — Ambulatory Visit: Payer: Medicare Other | Admitting: Physical Therapy

## 2019-09-13 MED ORDER — ENALAPRIL MALEATE 20 MG PO TABS: 20.0000 mg | ORAL_TABLET | Freq: Every day | ORAL | 2 refills | Status: DC

## 2019-09-17 ENCOUNTER — Ambulatory Visit: Payer: Medicare Other | Attending: Family Medicine | Admitting: Physical Therapy

## 2019-09-17 ENCOUNTER — Other Ambulatory Visit: Payer: Self-pay

## 2019-09-17 ENCOUNTER — Encounter: Payer: Self-pay | Admitting: Physical Therapy

## 2019-09-17 DIAGNOSIS — M545 Low back pain, unspecified: Secondary | ICD-10-CM

## 2019-09-17 DIAGNOSIS — G8929 Other chronic pain: Secondary | ICD-10-CM | POA: Diagnosis not present

## 2019-09-17 DIAGNOSIS — R262 Difficulty in walking, not elsewhere classified: Secondary | ICD-10-CM | POA: Insufficient documentation

## 2019-09-17 DIAGNOSIS — M5441 Lumbago with sciatica, right side: Secondary | ICD-10-CM | POA: Diagnosis not present

## 2019-09-17 NOTE — Therapy (Signed)
[facility] [address] [place], [address] Phone: [phone]   Fax:  [phone]  Physical Therapy Treatment  Patient Details  Name: Allen [name] MRN: [id number] Date of Birth: Oct 20, 1940 No data recorded  Encounter Date: 09/17/2019   PT End of Session - 09/17/19 1351    Visit Number 7    Number of Visits 16    Date for PT Re-Evaluation 10/10/19    Authorization Type medicare    Authorization - Visit Number 7    Authorization - Number of Visits 10    PT Start Time 0145    PT Stop Time 0230    PT Time Calculation (min) 45 min    Activity Tolerance Patient tolerated treatment well    Behavior During Therapy [facility] for tasks assessed/performed           Past Medical History:  Diagnosis Date  . Coronary artery disease   . Diabetes mellitus without complication ([place])   . Heart disease   . Hypertension   . MI (myocardial infarction) ([place])   . Sleep apnea     Past Surgical History:  Procedure Laterality Date  . CARDIAC CATHETERIZATION  1989  . CARDIAC CATHETERIZATION  1990  . GALLBLADDER SURGERY    . LEG SURGERY Right    stent placement    There were no vitals filed for this visit.   Subjective Assessment - 09/17/19 1349    Subjective Reports he is feeling better after his cold last week. Reports his COVID test was negative, but he did have a bad cold. Reports his back is feeling good and no losses of balance since last session. Reports compliance with HEP.    Pertinent History Pt is a pleasant 79 y.o. male returning to PT for chronic LBP (>6 years).  Pt has no hx of back trauma, and has received previous PT, injections, and chiropractor tx to address back pain with little success.  Pt reports past PT was very helpful in reducing pain and improving balance deficits.  Pt describes pain in low back as dull ache with intermittent stabbing pain that radiates into BLE R>L (Worts 5/10, Best and Current 1/10).   Pain worsens throughout the day and is aggravated by standing and walking; eases with sitting break, rest, and pain medications.  Pt reports frequent near falls at least once a day with 1 fall in the past 6 months; increased difficulty with unlevel surfaces, especially grass.  Pt is retired and ADLs encompass household chores and activities.  Pt would like to be able to return to walking around his neighborhood (1 mile) and kayaking.  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  Pt denies N/V, B&B changes, unexplained weight fluctuation, saddle paresthesia, fever, night sweats, or unrelenting night pain at this time.    Limitations Lifting;Standing;House hold activities;Walking    How long can you sit comfortably? Unlimited    How long can you stand comfortably? 10 minutes    How long can you walk comfortably? 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    Patient Stated Goals Walk 30 minutes without rest break    Pain Onset More than a month ago    Pain Onset More than a month ago           TherEx -Nustep L3 x68min, seat setting  9 with no UE for low level LEstrengthening - Mini squat with tap onto elevated mat table 3x 09/23/08 with cuing for core engagement to prevent lumbar ext and for full hip ext with good carry over - Prone alt hip ext 3x 10 with heavy cuing to prevent lumbar rotation with decent carry over, minimal hip ext ROM -Modified thomas stretch 60sec bilat increased "stretch felt" on RLE Dynamic balance speed changes called out randomly over 390ft with supervision for safety, increased time needed to change speed - Lateral step down 3in step 3x 6 bilat with increased difficulty with RLE step down with decreased control                            PT Education - 09/17/19 1350    Education provided Yes    Education Details therex form/technique    Methods  Explanation;Demonstration;Verbal cues    Comprehension Verbalized understanding;Verbal cues required;Returned demonstration            PT Short Term Goals - 08/15/19 1618      PT SHORT TERM GOAL #1   Title Pt will be independent with HEP in order to improve strength and decrease back pain in order to improve pain-free function at home and work.     Baseline 08/15/19    Time 4    Period Weeks    Status New    Target Date 09/12/19             PT Long Term Goals - 08/15/19 1620      PT LONG TERM GOAL #1   Title Pt will decrease worst back pain as reported on NPRS by at least 2 points in order to demonstrate clinically significant reduction in back pain.     Baseline 08/15/19 Worst 5/10    Time 8    Period Weeks    Status New      PT LONG TERM GOAL #2   Title Pt will ambulate for 30 minutes on outdoor pavement with LBP 3/10 or less to demonstrate decreased pain with prolonged walking.    Baseline 08/15/19 Pt requires rest break after 2-5 minutes of ambulation d/t LBP    Time 8    Period Weeks    Status New    Target Date 10/10/19      PT LONG TERM GOAL #3   Title Pt will increase 10MWT by at least 0.13 m/s in order to demonstrate clinically significant improvement in community ambulation.     Baseline 08/15/19 0.67 m/s    Time 8    Period Weeks    Status New    Target Date 10/10/19                 Plan - 09/17/19 1411    Clinical Impression Statement PT continued therex progression for increased lateral and core stability, and dynamic balance with success. Patient is able to comply with all cuing for proper technique of therex with good motivation throughout session. PT will continue progression as able.    Personal Factors and Comorbidities Age;Comorbidity 1;Comorbidity 2;Comorbidity 3+    Comorbidities DM, HTN, overweight    Examination-Activity Limitations Locomotion Level;Caring for Others;Stand;Stairs;Squat;Bend    Examination-Participation Restrictions  Art gallery manager;Yard Work;Laundry;Other    Stability/Clinical Decision Making Evolving/Moderate complexity    Clinical Decision Making Moderate    Rehab Potential Fair    Clinical Impairments Affecting Rehab Potential Negative: Chronic condition, co-morbidities. Positive: motivated to  improve, good results from previous PT.     PT Frequency 2x / week    PT Duration 8 weeks    PT Treatment/Interventions ADLs/Self Care Home Management;Cryotherapy;Electrical Stimulation;Moist Heat;DME Instruction;Traction;Gait training;Stair training;Functional mobility training;Therapeutic activities;Neuromuscular re-education;Balance training;Therapeutic exercise;Patient/family education;Manual techniques;Passive range of motion;Dry needling;Energy conservation    PT Next Visit Plan Trunk rotation core strengthening on tball, dynamic balance, tandem gait    PT Home Exercise Plan posterior pelvic tilt, lumbar ext, modified tandem standing endurance with counter support    Consulted and Agree with Plan of Care Patient           Patient will benefit from skilled therapeutic intervention in order to improve the following deficits and impairments:  Abnormal gait, Decreased balance, Decreased endurance, Decreased mobility, Difficulty walking, Hypomobility, Impaired sensation, Decreased range of motion, Improper body mechanics, Decreased activity tolerance, Decreased coordination, Decreased strength, Impaired flexibility, Postural dysfunction, Pain  Visit Diagnosis: Chronic midline low back pain with right-sided sciatica  Difficulty in walking, not elsewhere classified  Chronic midline low back pain without sciatica     Problem List Patient Active Problem List   Diagnosis Date Noted  . Need for shingles vaccine 01/22/2019  . Need for vaccination against Streptococcus pneumoniae using pneumococcal conjugate vaccine 7 01/22/2019  . Encounter for general adult medical examination with abnormal  findings 01/18/2018  . Abnormal liver function 01/18/2018  . S/P laparoscopic cholecystectomy 01/18/2018  . Bradycardia 01/18/2018  . Cutaneous candidiasis 01/18/2018  . Oral candidiasis 01/18/2018  . Septic shock ([place]) 01/08/2018  . Insect bite of right lower leg 09/25/2017  . Atopic dermatitis 09/25/2017  . PAD (peripheral artery disease) ([place]) 11/24/2015  . Type 2 diabetes mellitus with hyperglycemia, without long-term current use of insulin ([place]) 11/24/2015  . Hyperlipidemia 11/24/2015  . Essential hypertension 11/24/2015  . PVD (peripheral vascular disease) ([place]) 10/29/2015   Durwin Reges DPT Durwin Reges 09/17/2019, 2:24 PM  [facility] [address] [place], [address] Phone: [phone]   Fax:  [phone]  Name: Winston Misner MRN: [id number] Date of Birth: 02-11-1941

## 2019-09-19 ENCOUNTER — Ambulatory Visit: Payer: Medicare Other | Admitting: Physical Therapy

## 2019-09-19 ENCOUNTER — Other Ambulatory Visit: Payer: Self-pay

## 2019-09-19 DIAGNOSIS — M5441 Lumbago with sciatica, right side: Secondary | ICD-10-CM | POA: Diagnosis not present

## 2019-09-19 DIAGNOSIS — G8929 Other chronic pain: Secondary | ICD-10-CM | POA: Diagnosis not present

## 2019-09-19 DIAGNOSIS — R262 Difficulty in walking, not elsewhere classified: Secondary | ICD-10-CM

## 2019-09-19 DIAGNOSIS — M545 Low back pain: Secondary | ICD-10-CM | POA: Diagnosis not present

## 2019-09-19 NOTE — Therapy (Signed)
[facility] [address], [place], [address] Phone: [phone]   Fax:  [phone]  Physical Therapy Treatment  Patient Details  Name: Allen [name] MRN: [id number] Date of Birth: 1940/08/23 No data recorded  Encounter Date: 09/19/2019   PT End of Session - 09/19/19 1308    Visit Number 8    Number of Visits 16    Date for PT Re-Evaluation 10/10/19    Authorization Type medicare    Authorization - Visit Number 8    Authorization - Number of Visits 10    PT Start Time 0102    PT Stop Time 0145    PT Time Calculation (min) 43 min    Equipment Utilized During Treatment Gait belt    Activity Tolerance Patient tolerated treatment well    Behavior During Therapy WFL for tasks assessed/performed           Past Medical History:  Diagnosis Date  . Coronary artery disease   . Diabetes mellitus without complication ([place])   . Heart disease   . Hypertension   . MI (myocardial infarction) ([place])   . Sleep apnea     Past Surgical History:  Procedure Laterality Date  . CARDIAC CATHETERIZATION  1989  . CARDIAC CATHETERIZATION  1990  . GALLBLADDER SURGERY    . LEG SURGERY Right    stent placement    There were no vitals filed for this visit.   Subjective Assessment - 09/19/19 1308    Subjective Reports he has been moving more, and completing his HEP without issue. Reports 2/10 pain today.    Pertinent History Pt is a pleasant 79 y.o. male returning to PT for chronic LBP (>6 years).  Pt has no hx of back trauma, and has received previous PT, injections, and chiropractor tx to address back pain with little success.  Pt reports past PT was very helpful in reducing pain and improving balance deficits.  Pt describes pain in low back as dull ache with intermittent stabbing pain that radiates into BLE R>L (Worts 5/10, Best and Current 1/10).  Pain worsens throughout the day and is aggravated by standing and walking; eases  with sitting break, rest, and pain medications.  Pt reports frequent near falls at least once a day with 1 fall in the past 6 months; increased difficulty with unlevel surfaces, especially grass.  Pt is retired and ADLs encompass household chores and activities.  Pt would like to be able to return to walking around his neighborhood (1 mile) and kayaking.  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  Pt denies N/V, B&B changes, unexplained weight fluctuation, saddle paresthesia, fever, night sweats, or unrelenting night pain at this time.    Limitations Lifting;Standing;House hold activities;Walking    How long can you sit comfortably? Unlimited    How long can you stand comfortably? 10 minutes    How long can you walk comfortably? 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    Patient Stated Goals Walk 30 minutes without rest break    Pain Onset More than a month ago    Pain Onset More than a month ago           TherEx -Nustep L3 x61min, seat setting 9 with no UE for low level LEstrengthening - Mini squat with tap onto elevated mat  table x10 with good carry over from previous session With 5# DB outstretched 2x 8 with good carry over following demo - Lateral step down 3in step 3x 09/22/08 each LE with good carry over of cuing for control and full hip ext with stand - Sidestepping x82ftR/L; with YTB x2 trials with CGA for safety, cuing for eccentric control with good carry over Dynamic balance speed changes called out randomly over 220ft with supervision for safety, increased time needed to change speed                   PT Education - 09/19/19 1310    Education provided Yes    Education Details therex form/technique    Person(s) Educated Patient    Methods Explanation    Comprehension Verbalized understanding            PT Short Term Goals - 08/15/19 1618      PT  SHORT TERM GOAL #1   Title Pt will be independent with HEP in order to improve strength and decrease back pain in order to improve pain-free function at home and work.     Baseline 08/15/19    Time 4    Period Weeks    Status New    Target Date 09/12/19             PT Long Term Goals - 08/15/19 1620      PT LONG TERM GOAL #1   Title Pt will decrease worst back pain as reported on NPRS by at least 2 points in order to demonstrate clinically significant reduction in back pain.     Baseline 08/15/19 Worst 5/10    Time 8    Period Weeks    Status New      PT LONG TERM GOAL #2   Title Pt will ambulate for 30 minutes on outdoor pavement with LBP 3/10 or less to demonstrate decreased pain with prolonged walking.    Baseline 08/15/19 Pt requires rest break after 2-5 minutes of ambulation d/t LBP    Time 8    Period Weeks    Status New    Target Date 10/10/19      PT LONG TERM GOAL #3   Title Pt will increase 10MWT by at least 0.13 m/s in order to demonstrate clinically significant improvement in community ambulation.     Baseline 08/15/19 0.67 m/s    Time 8    Period Weeks    Status New    Target Date 10/10/19                 Plan - 09/19/19 1321    Clinical Impression Statement PT continued therex progression for increased lateral and core stability, and dyamic balance,  with success. Patient is able to complete progression with good carry over of demo and multi-modal cuing. PT will continue progression as able.    Personal Factors and Comorbidities Age;Comorbidity 1;Comorbidity 2;Comorbidity 3+    Comorbidities DM, HTN, overweight    Examination-Activity Limitations Locomotion Level;Caring for Others;Stand;Stairs;Squat;Bend    Examination-Participation Restrictions Art gallery manager;Yard Work;Laundry;Other    Stability/Clinical Decision Making Evolving/Moderate complexity    Clinical Decision Making Moderate    Rehab Potential Fair    Clinical Impairments  Affecting Rehab Potential Negative: Chronic condition, co-morbidities. Positive: motivated to improve, good results from previous PT.     PT Frequency 2x / week    PT Duration 8 weeks    PT Treatment/Interventions ADLs/Self Care Home Management;Cryotherapy;Electrical Stimulation;Moist Heat;DME  Instruction;Traction;Gait training;Stair training;Functional mobility training;Therapeutic activities;Neuromuscular re-education;Balance training;Therapeutic exercise;Patient/family education;Manual techniques;Passive range of motion;Dry needling;Energy conservation    PT Next Visit Plan Trunk rotation core strengthening on tball, dynamic balance, tandem gait    PT Home Exercise Plan posterior pelvic tilt, lumbar ext, modified tandem standing endurance with counter support    Consulted and Agree with Plan of Care Patient           Patient will benefit from skilled therapeutic intervention in order to improve the following deficits and impairments:  Abnormal gait, Decreased balance, Decreased endurance, Decreased mobility, Difficulty walking, Hypomobility, Impaired sensation, Decreased range of motion, Improper body mechanics, Decreased activity tolerance, Decreased coordination, Decreased strength, Impaired flexibility, Postural dysfunction, Pain  Visit Diagnosis: Chronic midline low back pain with right-sided sciatica  Difficulty in walking, not elsewhere classified  Chronic midline low back pain without sciatica     Problem List Patient Active Problem List   Diagnosis Date Noted  . Need for shingles vaccine 01/22/2019  . Need for vaccination against Streptococcus pneumoniae using pneumococcal conjugate vaccine 7 01/22/2019  . Encounter for general adult medical examination with abnormal findings 01/18/2018  . Abnormal liver function 01/18/2018  . S/P laparoscopic cholecystectomy 01/18/2018  . Bradycardia 01/18/2018  . Cutaneous candidiasis 01/18/2018  . Oral candidiasis 01/18/2018  . Septic  shock ([place]) 01/08/2018  . Insect bite of right lower leg 09/25/2017  . Atopic dermatitis 09/25/2017  . PAD (peripheral artery disease) ([place]) 11/24/2015  . Type 2 diabetes mellitus with hyperglycemia, without long-term current use of insulin ([place]) 11/24/2015  . Hyperlipidemia 11/24/2015  . Essential hypertension 11/24/2015  . PVD (peripheral vascular disease) ([place]) 10/29/2015   Durwin Reges DPT Durwin Reges 09/19/2019, 1:41 PM  [facility] [address] [place], [address] Phone: [phone]   Fax:  [phone]  Name: Adelbert Gaspard MRN: [id number] Date of Birth: 1940-08-26

## 2019-09-24 ENCOUNTER — Encounter: Payer: Self-pay | Admitting: Physical Therapy

## 2019-09-24 ENCOUNTER — Ambulatory Visit: Payer: Medicare Other | Admitting: Physical Therapy

## 2019-09-24 ENCOUNTER — Other Ambulatory Visit: Payer: Self-pay

## 2019-09-24 DIAGNOSIS — G8929 Other chronic pain: Secondary | ICD-10-CM

## 2019-09-24 DIAGNOSIS — M5441 Lumbago with sciatica, right side: Secondary | ICD-10-CM | POA: Diagnosis not present

## 2019-09-24 DIAGNOSIS — M545 Low back pain, unspecified: Secondary | ICD-10-CM

## 2019-09-24 DIAGNOSIS — R262 Difficulty in walking, not elsewhere classified: Secondary | ICD-10-CM

## 2019-09-24 NOTE — Therapy (Signed)
[facility] [address] [place], [address] Phone: [phone]   Fax:  [phone]  Physical Therapy Treatment  Patient Details  Name: Allen [name] MRN: [id number] Date of Birth: [birth year] No data recorded  Encounter Date: 09/24/2019   PT End of Session - 09/24/19 1349    Visit Number 9    Number of Visits 16    Date for PT Re-Evaluation 10/10/19    Authorization - Visit Number 9    Authorization - Number of Visits 10    PT Start Time 0145    PT Stop Time 0230    PT Time Calculation (min) 45 min    Activity Tolerance Patient tolerated treatment well    Behavior During Therapy [facility] for tasks assessed/performed           Past Medical History:  Diagnosis Date  . Coronary artery disease   . Diabetes mellitus without complication ([place])   . Heart disease   . Hypertension   . MI (myocardial infarction) ([place])   . Sleep apnea     Past Surgical History:  Procedure Laterality Date  . CARDIAC CATHETERIZATION  1989  . CARDIAC CATHETERIZATION  1990  . GALLBLADDER SURGERY    . LEG SURGERY Right    stent placement    There were no vitals filed for this visit.   Subjective Assessment - 09/24/19 1347    Subjective Reports 2/10 pain today, but that he has several days where he had no pain, which he was happy with.    Pertinent History Pt is a pleasant 79 y.o. male returning to PT for chronic LBP (>6 years).  Pt has no hx of back trauma, and has received previous PT, injections, and chiropractor tx to address back pain with little success.  Pt reports past PT was very helpful in reducing pain and improving balance deficits.  Pt describes pain in low back as dull ache with intermittent stabbing pain that radiates into BLE R>L (Worts 5/10, Best and Current 1/10).  Pain worsens throughout the day and is aggravated by standing and walking; eases with sitting break, rest, and pain medications.  Pt reports frequent near  falls at least once a day with 1 fall in the past 6 months; increased difficulty with unlevel surfaces, especially grass.  Pt is retired and ADLs encompass household chores and activities.  Pt would like to be able to return to walking around his neighborhood (1 mile) and kayaking.  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  Pt denies N/V, B&B changes, unexplained weight fluctuation, saddle paresthesia, fever, night sweats, or unrelenting night pain at this time.    Limitations Lifting;Standing;House hold activities;Walking    How long can you sit comfortably? Unlimited    How long can you stand comfortably? 10 minutes    How long can you walk comfortably? 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    Patient Stated Goals Walk 30 minutes without rest break    Pain Onset More than a month ago    Pain Onset More than a month ago             TherEx -Nustep L3 x56min, seat setting 9 with no UE for low level LEstrengthening - Toe taps onto 6in step x20 with unilateral UE support; 2x 20 without UE support with  min cuing for posture and speed with decent carry over - Mini squat with 5# DB swing 3x 8 with cuing for stand With 5# DB outstretched 2x 8 with good carry over following demo - alt runners step up onto 6in step 3x8 with unilateral UE support, good carry over of cuing for technique Dynamic balance speed changes called out randomly over 370ft with supervision for safety, increased time needed to change speed Tandem walking 71ft x4 with cuing to decrease speed                        PT Education - 09/24/19 1349    Education provided Yes    Education Details therex form/technique    Person(s) Educated Patient    Methods Explanation;Demonstration;Verbal cues    Comprehension Verbalized understanding;Returned demonstration;Verbal cues required            PT  Short Term Goals - 08/15/19 1618      PT SHORT TERM GOAL #1   Title Pt will be independent with HEP in order to improve strength and decrease back pain in order to improve pain-free function at home and work.     Baseline 08/15/19    Time 4    Period Weeks    Status New    Target Date 09/12/19             PT Long Term Goals - 08/15/19 1620      PT LONG TERM GOAL #1   Title Pt will decrease worst back pain as reported on NPRS by at least 2 points in order to demonstrate clinically significant reduction in back pain.     Baseline 08/15/19 Worst 5/10    Time 8    Period Weeks    Status New      PT LONG TERM GOAL #2   Title Pt will ambulate for 30 minutes on outdoor pavement with LBP 3/10 or less to demonstrate decreased pain with prolonged walking.    Baseline 08/15/19 Pt requires rest break after 2-5 minutes of ambulation d/t LBP    Time 8    Period Weeks    Status New    Target Date 10/10/19      PT LONG TERM GOAL #3   Title Pt will increase 10MWT by at least 0.13 m/s in order to demonstrate clinically significant improvement in community ambulation.     Baseline 08/15/19 0.67 m/s    Time 8    Period Weeks    Status New    Target Date 10/10/19                 Plan - 09/24/19 1420    Clinical Impression Statement PT continued therex progression for increased power/strength and dynamic balance with good success. Patinet is able to complete all therex with proper technique following cuing and demonstrations. Patient is motivated throughout session without increased pain reported. PT will continue progression as able.    Personal Factors and Comorbidities Age;Comorbidity 1;Comorbidity 2;Comorbidity 3+    Comorbidities DM, HTN, overweight    Examination-Activity Limitations Locomotion Level;Caring for Others;Stand;Stairs;Squat;Bend    Examination-Participation Restrictions Art gallery manager;Yard Work;Laundry;Other    Stability/Clinical Decision Making  Evolving/Moderate complexity    Clinical Decision Making Moderate    Rehab Potential Fair    Clinical Impairments Affecting Rehab Potential Negative: Chronic condition, co-morbidities. Positive: motivated to improve, good results from previous PT.     PT Frequency 2x / week    PT  Duration 8 weeks    PT Treatment/Interventions ADLs/Self Care Home Management;Cryotherapy;Electrical Stimulation;Moist Heat;DME Instruction;Traction;Gait training;Stair training;Functional mobility training;Therapeutic activities;Neuromuscular re-education;Balance training;Therapeutic exercise;Patient/family education;Manual techniques;Passive range of motion;Dry needling;Energy conservation    PT Next Visit Plan Trunk rotation core strengthening on tball, dynamic balance, tandem gait    PT Home Exercise Plan posterior pelvic tilt, lumbar ext, modified tandem standing endurance with counter support    Consulted and Agree with Plan of Care Patient           Patient will benefit from skilled therapeutic intervention in order to improve the following deficits and impairments:  Abnormal gait, Decreased balance, Decreased endurance, Decreased mobility, Difficulty walking, Hypomobility, Impaired sensation, Decreased range of motion, Improper body mechanics, Decreased activity tolerance, Decreased coordination, Decreased strength, Impaired flexibility, Postural dysfunction, Pain  Visit Diagnosis: Chronic midline low back pain with right-sided sciatica  Difficulty in walking, not elsewhere classified  Chronic midline low back pain without sciatica     Problem List Patient Active Problem List   Diagnosis Date Noted  . Need for shingles vaccine 01/22/2019  . Need for vaccination against Streptococcus pneumoniae using pneumococcal conjugate vaccine 7 01/22/2019  . Encounter for general adult medical examination with abnormal findings 01/18/2018  . Abnormal liver function 01/18/2018  . S/P laparoscopic cholecystectomy  01/18/2018  . Bradycardia 01/18/2018  . Cutaneous candidiasis 01/18/2018  . Oral candidiasis 01/18/2018  . Septic shock ([place]) 01/08/2018  . Insect bite of right lower leg 09/25/2017  . Atopic dermatitis 09/25/2017  . PAD (peripheral artery disease) ([place]) 11/24/2015  . Type 2 diabetes mellitus with hyperglycemia, without long-term current use of insulin ([place]) 11/24/2015  . Hyperlipidemia 11/24/2015  . Essential hypertension 11/24/2015  . PVD (peripheral vascular disease) ([place]) 10/29/2015   Durwin Reges DPT Durwin Reges 09/24/2019, 2:38 PM  [facility] [address] [place], [address] Phone: [phone]   Fax:  [phone]  Name: Guthrie Lemme MRN: [id number] Date of Birth: 06/27/1940

## 2019-09-26 ENCOUNTER — Other Ambulatory Visit: Payer: Self-pay

## 2019-09-26 ENCOUNTER — Ambulatory Visit: Payer: Medicare Other | Admitting: Physical Therapy

## 2019-09-26 ENCOUNTER — Encounter: Payer: Self-pay | Admitting: Physical Therapy

## 2019-09-26 DIAGNOSIS — M5441 Lumbago with sciatica, right side: Secondary | ICD-10-CM | POA: Diagnosis not present

## 2019-09-26 DIAGNOSIS — G8929 Other chronic pain: Secondary | ICD-10-CM

## 2019-09-26 DIAGNOSIS — M545 Low back pain, unspecified: Secondary | ICD-10-CM

## 2019-09-26 DIAGNOSIS — R262 Difficulty in walking, not elsewhere classified: Secondary | ICD-10-CM | POA: Diagnosis not present

## 2019-09-26 NOTE — Therapy (Signed)
[facility] [address], [place], [address] Phone: [phone]   Fax:  [phone]  Physical Therapy Treatment/Progress Report Reporting Period: 08/15/19 - 09/26/19  Patient Details  Name: Allen [name] MRN: [id number] Date of Birth: [birth year] No data recorded  Encounter Date: 09/26/2019   PT End of Session - 09/26/19 1306    Visit Number 10    Number of Visits 16    Date for PT Re-Evaluation 10/26/19    Authorization - Visit Number 10    Authorization - Number of Visits 10    PT Start Time 0100    PT Stop Time 0143    PT Time Calculation (min) 43 min    Activity Tolerance Patient tolerated treatment well    Behavior During Therapy [facility] for tasks assessed/performed           Past Medical History:  Diagnosis Date  . Coronary artery disease   . Diabetes mellitus without complication ([place])   . Heart disease   . Hypertension   . MI (myocardial infarction) ([place])   . Sleep apnea     Past Surgical History:  Procedure Laterality Date  . CARDIAC CATHETERIZATION  1989  . CARDIAC CATHETERIZATION  1990  . GALLBLADDER SURGERY    . LEG SURGERY Right    stent placement    There were no vitals filed for this visit.   Subjective Assessment - 09/26/19 1305    Subjective Reports no pain today, went shopping at [employer] yesterday and felt good, which he is happy with.    Pertinent History Pt is a pleasant 79 y.o. male returning to PT for chronic LBP (>6 years).  Pt has no hx of back trauma, and has received previous PT, injections, and chiropractor tx to address back pain with little success.  Pt reports past PT was very helpful in reducing pain and improving balance deficits.  Pt describes pain in low back as dull ache with intermittent stabbing pain that radiates into BLE R>L (Worts 5/10, Best and Current 1/10).  Pain worsens throughout the day and is aggravated by standing and walking; eases with sitting break,  rest, and pain medications.  Pt reports frequent near falls at least once a day with 1 fall in the past 6 months; increased difficulty with unlevel surfaces, especially grass.  Pt is retired and ADLs encompass household chores and activities.  Pt would like to be able to return to walking around his neighborhood (1 mile) and kayaking.  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  Pt denies N/V, B&B changes, unexplained weight fluctuation, saddle paresthesia, fever, night sweats, or unrelenting night pain at this time.    Limitations Lifting;Standing;House hold activities;Walking    How long can you sit comfortably? Unlimited    How long can you stand comfortably? 10 minutes    How long can you walk comfortably? 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 PT educated patient on parameters of therex (how/when to inc/decrease intensity, frequency, rep/set range, stretch hold time, and purpose of therex) with verbalized understanding.  Access Code: [email or web address] Mini Squat with Chair - 1 x daily - 2 x weekly - 3 sets - 8-10 reps Standing Hip Abduction with Resistance at Ankles and Counter Support - 1 x daily - 2 x weekly - 3 sets - 8-10 reps Heel rises with counter support - 1 x daily - 2 x weekly - 3 sets - 8-10 reps Standing Tandem Balance with Counter Support - 1 x daily - 2 x weekly - 3 sets - 30sec hold  Gait training: outside over 355f with grass, decline ramp 15% grade, up 4 steps with min cuing to decrease speed on grass to  increase safety, and CGA for safety. Inside over 702fwith speed changes, horizontal and vertical head turns with 1x of stepping strategy and modI to maintain balance with horizontal head turns, good speed throughout CGA<>supervision for safety                            PT Short Term Goals - 08/15/19 1618      PT SHORT TERM GOAL #1   Title Pt will be independent with HEP in order to improve strength and decrease back pain in order to improve pain-free function at home and work.     Baseline 08/15/19    Time 4    Period Weeks    Status New    Target Date 09/12/19             PT Long Term Goals - 09/26/19 1307      PT LONG TERM GOAL #1   Title Pt will decrease worst back pain as reported on NPRS by at least 2 points in order to demonstrate clinically significant reduction in back pain.     Baseline 08/15/19 Worst 5/10; 09/26/19 1-2/10    Time 8    Period Weeks    Status Achieved      PT LONG TERM GOAL #2   Title Pt will ambulate for 30 minutes on outdoor pavement with LBP 3/10 or less to demonstrate decreased pain with prolonged walking.    Baseline 08/15/19 Pt requires rest break after 2-5 minutes of ambulation d/t LBP; 09/26/19 reports walking 1hour through grocery store without pain    Time 8    Period Weeks    Status Achieved      PT LONG TERM GOAL #3   Title Pt will increase 10MWT by at least 0.13 m/s in order to demonstrate clinically significant improvement in community ambulation.     Baseline 08/15/19 0.67 m/s; 09/26/19 1.25m38m   Time 8    Period Weeks    Status Achieved      PT LONG TERM GOAL #6   Status On-going                 Plan - 09/26/19 1342    Clinical Impression Statement PT reassessed goals this session where paiten thas met goals for pain reduction, increased walking tolerance and gait speed. PT reviewed HEP with paitnet and progressed dynamic balance with gait over increased distance and various surfaces. Patient will benfit  from skilled PT 1x/week for 4 weeks with robust HEP to ensure indpendence of carry over of PT session gains and pain reduction. PT will continue progression  as able.    Personal Factors and Comorbidities Age;Comorbidity 1;Comorbidity 2;Comorbidity 3+    Comorbidities DM, HTN, overweight    Examination-Activity Limitations Locomotion Level;Caring for Others;Stand;Stairs;Squat;Bend    Examination-Participation Restrictions Art gallery manager;Yard Work;Laundry;Other    Stability/Clinical Decision Making Evolving/Moderate complexity    Clinical Decision Making Moderate    Rehab Potential Fair    Clinical Impairments Affecting Rehab Potential Negative: Chronic condition, co-morbidities. Positive: motivated to improve, good results from previous PT.     PT Frequency 2x / week    PT Duration 8 weeks    PT Treatment/Interventions ADLs/Self Care Home Management;Cryotherapy;Electrical Stimulation;Moist Heat;DME Instruction;Traction;Gait training;Stair training;Functional mobility training;Therapeutic activities;Neuromuscular re-education;Balance training;Therapeutic exercise;Patient/family education;Manual techniques;Passive range of motion;Dry needling;Energy conservation    PT Next Visit Plan Trunk rotation core strengthening on tball, dynamic balance, tandem gait    PT Home Exercise Plan posterior pelvic tilt, lumbar ext, modified tandem standing endurance with counter support    Consulted and Agree with Plan of Care Patient           Patient will benefit from skilled therapeutic intervention in order to improve the following deficits and impairments:  Abnormal gait, Decreased balance, Decreased endurance, Decreased mobility, Difficulty walking, Hypomobility, Impaired sensation, Decreased range of motion, Improper body mechanics, Decreased activity tolerance, Decreased coordination, Decreased strength, Impaired flexibility, Postural dysfunction, Pain  Visit Diagnosis: Chronic midline low  back pain with right-sided sciatica  Difficulty in walking, not elsewhere classified  Chronic midline low back pain without sciatica     Problem List Patient Active Problem List   Diagnosis Date Noted  . Need for shingles vaccine 01/22/2019  . Need for vaccination against Streptococcus pneumoniae using pneumococcal conjugate vaccine 7 01/22/2019  . Encounter for general adult medical examination with abnormal findings 01/18/2018  . Abnormal liver function 01/18/2018  . S/P laparoscopic cholecystectomy 01/18/2018  . Bradycardia 01/18/2018  . Cutaneous candidiasis 01/18/2018  . Oral candidiasis 01/18/2018  . Septic shock ([place]) 01/08/2018  . Insect bite of right lower leg 09/25/2017  . Atopic dermatitis 09/25/2017  . PAD (peripheral artery disease) ([place]) 11/24/2015  . Type 2 diabetes mellitus with hyperglycemia, without long-term current use of insulin ([place]) 11/24/2015  . Hyperlipidemia 11/24/2015  . Essential hypertension 11/24/2015  . PVD (peripheral vascular disease) ([place]) 10/29/2015   Camri Molloy Terance Hart DPT Durwin Reges 09/26/2019, 1:44 PM  [facility] [address] [place], [address] Phone: [phone]   Fax:  [phone]  Name: Allen [name] MRN: [id number] Date of Birth: November 04, 1940

## 2019-10-01 ENCOUNTER — Ambulatory Visit: Payer: Medicare Other | Admitting: Physical Therapy

## 2019-10-03 ENCOUNTER — Ambulatory Visit: Payer: Medicare Other | Admitting: Physical Therapy

## 2019-10-03 ENCOUNTER — Other Ambulatory Visit: Payer: Self-pay

## 2019-10-03 ENCOUNTER — Encounter: Payer: Self-pay | Admitting: Physical Therapy

## 2019-10-03 DIAGNOSIS — M5441 Lumbago with sciatica, right side: Secondary | ICD-10-CM

## 2019-10-03 DIAGNOSIS — R262 Difficulty in walking, not elsewhere classified: Secondary | ICD-10-CM | POA: Diagnosis not present

## 2019-10-03 DIAGNOSIS — G8929 Other chronic pain: Secondary | ICD-10-CM

## 2019-10-03 DIAGNOSIS — M545 Low back pain: Secondary | ICD-10-CM | POA: Diagnosis not present

## 2019-10-03 NOTE — Therapy (Signed)
[facility] [address], [place], [address] Phone: [phone]   Fax:  [phone]  Physical Therapy Treatment  Patient Details  Name: Allen [name] MRN: [id number] Date of Birth: Sep 28, 1940 No data recorded  Encounter Date: 10/03/2019   PT End of Session - 10/03/19 1310    Visit Number 11    Number of Visits 16    Date for PT Re-Evaluation 10/26/19    Authorization Type medicare    Authorization - Visit Number 1    Authorization - Number of Visits 10    PT Start Time 0100    PT Stop Time 0140    PT Time Calculation (min) 40 min    Equipment Utilized During Treatment Gait belt    Activity Tolerance Patient tolerated treatment well    Behavior During Therapy WFL for tasks assessed/performed           Past Medical History:  Diagnosis Date  . Coronary artery disease   . Diabetes mellitus without complication ([place])   . Heart disease   . Hypertension   . MI (myocardial infarction) ([place])   . Sleep apnea     Past Surgical History:  Procedure Laterality Date  . CARDIAC CATHETERIZATION  1989  . CARDIAC CATHETERIZATION  1990  . GALLBLADDER SURGERY    . LEG SURGERY Right    stent placement    There were no vitals filed for this visit.   Subjective Assessment - 10/03/19 1305    Subjective Patient repots some increased LBP today, woke up with 4/10 pain in R ankle, and a little back pain. Reports he is completing his HEP but that his banded abduction can bother his legs somewhat.    Pertinent History Pt is a pleasant 79 y.o. male returning to PT for chronic LBP (>6 years).  Pt has no hx of back trauma, and has received previous PT, injections, and chiropractor tx to address back pain with little success.  Pt reports past PT was very helpful in reducing pain and improving balance deficits.  Pt describes pain in low back as dull ache with intermittent stabbing pain that radiates into BLE R>L (Worts 5/10, Best  and Current 1/10).  Pain worsens throughout the day and is aggravated by standing and walking; eases with sitting break, rest, and pain medications.  Pt reports frequent near falls at least once a day with 1 fall in the past 6 months; increased difficulty with unlevel surfaces, especially grass.  Pt is retired and ADLs encompass household chores and activities.  Pt would like to be able to return to walking around his neighborhood (1 mile) and kayaking.  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  Pt denies N/V, B&B changes, unexplained weight fluctuation, saddle paresthesia, fever, night sweats, or unrelenting night pain at this time.    Limitations Lifting;Standing;House hold activities;Walking    How long can you sit comfortably? Unlimited    How long can you stand comfortably? 10 minutes    How long can you walk comfortably? 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 HEP updated - Tandem stance light BUE support x30sec bilat; stagger stance 30sec bilat with no UE support, occassional touch needed - Alt heel tap onto 8in box x12; x20 with min BUE support - Alt step up onto 8in step 3x 10 with cuing to "push" up through LEs as opposed to "pulling up" with bar with good carry over. - lateral step over 6in hurdle 2x 10 with cuing for active DF for foot clearance with good carry over        PT Education - 10/03/19 1309    Education provided Yes    Education Details therex form/technique, HEP update    Person(s) Educated Patient     Methods Explanation;Demonstration;Verbal cues    Comprehension Verbalized understanding;Returned demonstration;Verbal cues required            PT Short Term Goals - 08/15/19 1618      PT SHORT TERM GOAL #1   Title Pt will be independent with HEP in order to improve strength and decrease back pain in order to improve pain-free function at home and work.     Baseline 08/15/19    Time 4    Period Weeks    Status New    Target Date 09/12/19             PT Long Term Goals - 09/26/19 1307      PT LONG TERM GOAL #1   Title Pt will decrease worst back pain as reported on NPRS by at least 2 points in order to demonstrate clinically significant reduction in back pain.     Baseline 08/15/19 Worst 5/10; 09/26/19 1-2/10    Time 8    Period Weeks    Status Achieved      PT LONG TERM GOAL #2   Title Pt will ambulate for 30 minutes on outdoor pavement with LBP 3/10 or less to demonstrate decreased pain with prolonged walking.    Baseline 08/15/19 Pt requires rest break after 2-5 minutes of ambulation d/t LBP; 09/26/19 reports walking 1hour through grocery store without pain    Time 8    Period Weeks    Status Achieved      PT LONG TERM GOAL #3   Title Pt will increase 10MWT by at least 0.13 m/s in order to demonstrate clinically significant improvement in community ambulation.     Baseline 08/15/19 0.67 m/s; 09/26/19 1.8m/s    Time 8    Period Weeks    Status Achieved      PT LONG TERM GOAL #6   Status On-going                 Plan - 10/03/19 1319    Clinical Impression Statement PT continued therex progression for increased LE and core strength, motor control, and balance with success. PT reviewed HEP with modifications made for increased ind with this at home with verbalized and demonstrated understanding. Pt is able to comply with all cuing for proper tehcnique of therex with good motivation and no increased pain hroughout. Reports less pain at conclusion of session. PT will  continue progression as able.    Personal Factors and Comorbidities Age;Comorbidity 1;Comorbidity 2;Comorbidity 3+    Comorbidities DM, HTN, overweight    Examination-Activity Limitations Locomotion Level;Caring for Others;Stand;Stairs;Squat;Bend  Examination-Participation Restrictions Art gallery manager;Yard Work;Laundry;Other    Stability/Clinical Decision Making Evolving/Moderate complexity    Clinical Decision Making Moderate    Rehab Potential Fair    Clinical Impairments Affecting Rehab Potential Negative: Chronic condition, co-morbidities. Positive: motivated to improve, good results from previous PT.     PT Frequency 2x / week    PT Duration 8 weeks    PT Treatment/Interventions ADLs/Self Care Home Management;Cryotherapy;Electrical Stimulation;Moist Heat;DME Instruction;Traction;Gait training;Stair training;Functional mobility training;Therapeutic activities;Neuromuscular re-education;Balance training;Therapeutic exercise;Patient/family education;Manual techniques;Passive range of motion;Dry needling;Energy conservation    PT Next Visit Plan Trunk rotation core strengthening on tball, dynamic balance, tandem gait    PT Home Exercise Plan posterior pelvic tilt, lumbar ext, modified tandem standing endurance with counter support    Consulted and Agree with Plan of Care Patient           Patient will benefit from skilled therapeutic intervention in order to improve the following deficits and impairments:  Abnormal gait, Decreased balance, Decreased endurance, Decreased mobility, Difficulty walking, Hypomobility, Impaired sensation, Decreased range of motion, Improper body mechanics, Decreased activity tolerance, Decreased coordination, Decreased strength, Impaired flexibility, Postural dysfunction, Pain  Visit Diagnosis: Chronic midline low back pain with right-sided sciatica  Difficulty in walking, not elsewhere classified  Chronic midline low back pain without  sciatica     Problem List Patient Active Problem List   Diagnosis Date Noted  . Need for shingles vaccine 01/22/2019  . Need for vaccination against Streptococcus pneumoniae using pneumococcal conjugate vaccine 7 01/22/2019  . Encounter for general adult medical examination with abnormal findings 01/18/2018  . Abnormal liver function 01/18/2018  . S/P laparoscopic cholecystectomy 01/18/2018  . Bradycardia 01/18/2018  . Cutaneous candidiasis 01/18/2018  . Oral candidiasis 01/18/2018  . Septic shock ([place]) 01/08/2018  . Insect bite of right lower leg 09/25/2017  . Atopic dermatitis 09/25/2017  . PAD (peripheral artery disease) ([place]) 11/24/2015  . Type 2 diabetes mellitus with hyperglycemia, without long-term current use of insulin ([place]) 11/24/2015  . Hyperlipidemia 11/24/2015  . Essential hypertension 11/24/2015  . PVD (peripheral vascular disease) ([place]) 10/29/2015   Durwin Reges DPT Durwin Reges 10/03/2019, 1:38 PM  [facility] [address] [place], [address] Phone: [phone]   Fax:  [phone]  Name: Allen [name] MRN: [id number] Date of Birth: 12-04-1940

## 2019-10-08 ENCOUNTER — Ambulatory Visit: Payer: Medicare Other | Admitting: Physical Therapy

## 2019-10-10 ENCOUNTER — Other Ambulatory Visit: Payer: Self-pay

## 2019-10-10 ENCOUNTER — Ambulatory Visit: Payer: Medicare Other | Admitting: Physical Therapy

## 2019-10-10 ENCOUNTER — Encounter: Payer: Self-pay | Admitting: Physical Therapy

## 2019-10-10 DIAGNOSIS — R262 Difficulty in walking, not elsewhere classified: Secondary | ICD-10-CM | POA: Diagnosis not present

## 2019-10-10 DIAGNOSIS — M545 Low back pain: Secondary | ICD-10-CM | POA: Diagnosis not present

## 2019-10-10 DIAGNOSIS — M5441 Lumbago with sciatica, right side: Secondary | ICD-10-CM | POA: Diagnosis not present

## 2019-10-10 DIAGNOSIS — G8929 Other chronic pain: Secondary | ICD-10-CM | POA: Diagnosis not present

## 2019-10-10 NOTE — Therapy (Signed)
[place] [facility] [address], [place], [address] Phone: [phone]   Fax:  [phone]  Physical Therapy Treatment  Patient Details  Name: Allen [name] MRN: [id number] Date of Birth: 1940/07/22 No data recorded  Encounter Date: 10/10/2019   PT End of Session - 10/10/19 1307    Visit Number 12    Number of Visits 16    Date for PT Re-Evaluation 10/26/19    Authorization Type medicare    Authorization Time Period 4/10    Authorization - Visit Number 2    Authorization - Number of Visits 10    PT Start Time 0100    PT Stop Time 0140    PT Time Calculation (min) 40 min    Activity Tolerance Patient tolerated treatment well    Behavior During Therapy [facility] for tasks assessed/performed           Past Medical History:  Diagnosis Date  . Coronary artery disease   . Diabetes mellitus without complication ([place])   . Heart disease   . Hypertension   . MI (myocardial infarction) ([place])   . Sleep apnea     Past Surgical History:  Procedure Laterality Date  . CARDIAC CATHETERIZATION  1989  . CARDIAC CATHETERIZATION  1990  . GALLBLADDER SURGERY    . LEG SURGERY Right    stent placement    There were no vitals filed for this visit.   Subjective Assessment - 10/10/19 1302    Subjective Pt reports he is doing well overall. Reports some soreness following shopping yesterday and having to tote many boxes of canned drinks and toilet paper. Patient reports no pain, mostly just soreness.    Pertinent History Pt is a pleasant 79 y.o. male returning to PT for chronic LBP (>6 years).  Pt has no hx of back trauma, and has received previous PT, injections, and chiropractor tx to address back pain with little success.  Pt reports past PT was very helpful in reducing pain and improving balance deficits.  Pt describes pain in low back as dull ache with intermittent stabbing pain that radiates into BLE R>L (Worts 5/10, Best and  Current 1/10).  Pain worsens throughout the day and is aggravated by standing and walking; eases with sitting break, rest, and pain medications.  Pt reports frequent near falls at least once a day with 1 fall in the past 6 months; increased difficulty with unlevel surfaces, especially grass.  Pt is retired and ADLs encompass household chores and activities.  Pt would like to be able to return to walking around his neighborhood (1 mile) and kayaking.  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  Pt denies N/V, B&B changes, unexplained weight fluctuation, saddle paresthesia, fever, night sweats, or unrelenting night pain at this time.    Limitations Lifting;Standing;House hold activities;Walking    How long can you sit comfortably? Unlimited    How long can you stand comfortably? 10 minutes    How long can you walk comfortably? 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    Patient Stated Goals Walk 30 minutes without rest break    Pain Onset More than a month ago             TherEx -Nustep L3 x53min, seat setting 9 with no UE for low level LEstrengthening - [address]  3x 10 with min cuing for glute contraction with good carry over following - Mini squat to elevated mat table x10 with min cuing for full hip ext with good carry over; with 5# DB swing 2x 10 with good carry over of full hip ext  - MATRIX hip abd 25# 2x 10; 10# x10  bilat with cuing to maintain neutral posture with good carry over - Lateral step up onto 8in step 3x 8 bilat with cuing to "push" up through LEs as opposed to "pulling up" with bar with good carry over; cuing for eccentric lower with good carry over          PT Education - 10/10/19 1306    Education Details therex form/techniuqe    Person(s) Educated Patient    Methods Explanation;Demonstration    Comprehension Verbalized understanding;Returned  demonstration;Verbal cues required            PT Short Term Goals - 08/15/19 1618      PT SHORT TERM GOAL #1   Title Pt will be independent with HEP in order to improve strength and decrease back pain in order to improve pain-free function at home and work.     Baseline 08/15/19    Time 4    Period Weeks    Status New    Target Date 09/12/19             PT Long Term Goals - 09/26/19 1307      PT LONG TERM GOAL #1   Title Pt will decrease worst back pain as reported on NPRS by at least 2 points in order to demonstrate clinically significant reduction in back pain.     Baseline 08/15/19 Worst 5/10; 09/26/19 1-2/10    Time 8    Period Weeks    Status Achieved      PT LONG TERM GOAL #2   Title Pt will ambulate for 30 minutes on outdoor pavement with LBP 3/10 or less to demonstrate decreased pain with prolonged walking.    Baseline 08/15/19 Pt requires rest break after 2-5 minutes of ambulation d/t LBP; 09/26/19 reports walking 1hour through grocery store without pain    Time 8    Period Weeks    Status Achieved      PT LONG TERM GOAL #3   Title Pt will increase 10MWT by at least 0.13 m/s in order to demonstrate clinically significant improvement in community ambulation.     Baseline 08/15/19 0.67 m/s; 09/26/19 1.64m/s    Time 8    Period Weeks    Status Achieved      PT LONG TERM GOAL #6   Status On-going                 Plan - 10/10/19 1319    Clinical Impression Statement PT continued therex progression for increased core/hip strengthening, and motor control with good success. patient is able to comply with all cuing for proper technique of therex, with good motivation throughout sesison. PT will re-assess d/c readiness next session.    Personal Factors and Comorbidities Age;Comorbidity 1;Comorbidity 2;Comorbidity 3+    Comorbidities DM, HTN, overweight    Examination-Activity Limitations Locomotion Level;Caring for Others;Stand;Stairs;Squat;Bend     Examination-Participation Restrictions Art gallery manager;Yard Work;Laundry;Other    Stability/Clinical Decision Making Evolving/Moderate complexity    Clinical Decision Making Moderate    Rehab Potential Fair    Clinical Impairments Affecting Rehab Potential Negative: Chronic condition, co-morbidities. Positive: motivated to improve, good results from previous PT.  PT Frequency 2x / week    PT Duration 8 weeks    PT Treatment/Interventions ADLs/Self Care Home Management;Cryotherapy;Electrical Stimulation;Moist Heat;DME Instruction;Traction;Gait training;Stair training;Functional mobility training;Therapeutic activities;Neuromuscular re-education;Balance training;Therapeutic exercise;Patient/family education;Manual techniques;Passive range of motion;Dry needling;Energy conservation    PT Next Visit Plan Trunk rotation core strengthening on tball, dynamic balance, tandem gait    PT Home Exercise Plan posterior pelvic tilt, lumbar ext, modified tandem standing endurance with counter support    Consulted and Agree with Plan of Care Patient           Patient will benefit from skilled therapeutic intervention in order to improve the following deficits and impairments:  Abnormal gait, Decreased balance, Decreased endurance, Decreased mobility, Difficulty walking, Hypomobility, Impaired sensation, Decreased range of motion, Improper body mechanics, Decreased activity tolerance, Decreased coordination, Decreased strength, Impaired flexibility, Postural dysfunction, Pain  Visit Diagnosis: Chronic midline low back pain with right-sided sciatica  Difficulty in walking, not elsewhere classified  Chronic midline low back pain without sciatica     Problem List Patient Active Problem List   Diagnosis Date Noted  . Need for shingles vaccine 01/22/2019  . Need for vaccination against Streptococcus pneumoniae using pneumococcal conjugate vaccine 7 01/22/2019  . Encounter for general adult  medical examination with abnormal findings 01/18/2018  . Abnormal liver function 01/18/2018  . S/P laparoscopic cholecystectomy 01/18/2018  . Bradycardia 01/18/2018  . Cutaneous candidiasis 01/18/2018  . Oral candidiasis 01/18/2018  . Septic shock ([place]) 01/08/2018  . Insect bite of right lower leg 09/25/2017  . Atopic dermatitis 09/25/2017  . PAD (peripheral artery disease) ([place]) 11/24/2015  . Type 2 diabetes mellitus with hyperglycemia, without long-term current use of insulin ([place]) 11/24/2015  . Hyperlipidemia 11/24/2015  . Essential hypertension 11/24/2015  . PVD (peripheral vascular disease) ([place]) 10/29/2015   Durwin Reges DPT Durwin Reges 10/10/2019, 1:37 PM  [place] [facility] [address] [place], [address] Phone: [phone]   Fax:  [phone]  Name: Allen [name] MRN: [id number] Date of Birth: 1940/10/17

## 2019-10-11 DIAGNOSIS — Z1152 Encounter for screening for COVID-19: Secondary | ICD-10-CM | POA: Diagnosis not present

## 2019-10-11 DIAGNOSIS — Z03818 Encounter for observation for suspected exposure to other biological agents ruled out: Secondary | ICD-10-CM | POA: Diagnosis not present

## 2019-10-15 ENCOUNTER — Encounter: Payer: Medicare Other | Admitting: Physical Therapy

## 2019-10-16 DIAGNOSIS — Z03818 Encounter for observation for suspected exposure to other biological agents ruled out: Secondary | ICD-10-CM | POA: Diagnosis not present

## 2019-10-16 DIAGNOSIS — Z1152 Encounter for screening for COVID-19: Secondary | ICD-10-CM | POA: Diagnosis not present

## 2019-10-18 ENCOUNTER — Other Ambulatory Visit: Payer: Self-pay

## 2019-10-18 ENCOUNTER — Encounter: Payer: Self-pay | Admitting: Physical Therapy

## 2019-10-18 ENCOUNTER — Ambulatory Visit: Payer: Medicare Other | Attending: Family Medicine | Admitting: Physical Therapy

## 2019-10-18 DIAGNOSIS — R262 Difficulty in walking, not elsewhere classified: Secondary | ICD-10-CM | POA: Diagnosis not present

## 2019-10-18 DIAGNOSIS — M5441 Lumbago with sciatica, right side: Secondary | ICD-10-CM | POA: Diagnosis not present

## 2019-10-18 DIAGNOSIS — M545 Low back pain: Secondary | ICD-10-CM | POA: Insufficient documentation

## 2019-10-18 DIAGNOSIS — G8929 Other chronic pain: Secondary | ICD-10-CM | POA: Insufficient documentation

## 2019-10-18 NOTE — Therapy (Signed)
[place] [facility] [address] [place], [address] Phone: [phone]   Fax:  [phone]  Physical Therapy Treatment/Discharge Summary Reporting Period: 09/26/19 - 10/18/19  Patient Details  Name: Allen [name] MRN: [id number] Date of Birth: 1940-05-31 No data recorded  Encounter Date: 10/18/2019   PT End of Session - 10/18/19 1315    Visit Number 13    Number of Visits 16    Date for PT Re-Evaluation 10/26/19    Authorization Type medicare    Authorization Time Period 5/10    Authorization - Visit Number 3    Authorization - Number of Visits 10    PT Start Time 0100    PT Stop Time 0125    PT Time Calculation (min) 25 min    Equipment Utilized During Treatment Gait belt    Activity Tolerance Patient tolerated treatment well    Behavior During Therapy [facility] for tasks assessed/performed           Past Medical History:  Diagnosis Date  . Coronary artery disease   . Diabetes mellitus without complication ([place])   . Heart disease   . Hypertension   . MI (myocardial infarction) ([place])   . Sleep apnea     Past Surgical History:  Procedure Laterality Date  . CARDIAC CATHETERIZATION  1989  . CARDIAC CATHETERIZATION  1990  . GALLBLADDER SURGERY    . LEG SURGERY Right    stent placement    There were no vitals filed for this visit.   Subjective Assessment - 10/18/19 1307    Subjective Patient reports he has been very busy with his daughter and grandchildren having COVID, though he has tested negative. Patient reports min compliance with HEP d/t this. No pain today.    Pertinent History Pt is a pleasant 79 y.o. male returning to PT for chronic LBP (>6 years).  Pt has no hx of back trauma, and has received previous PT, injections, and chiropractor tx to address back pain with little success.  Pt reports past PT was very helpful in reducing pain and improving balance deficits.  Pt describes pain in low back as dull ache  with intermittent stabbing pain that radiates into BLE R>L (Worts 5/10, Best and Current 1/10).  Pain worsens throughout the day and is aggravated by standing and walking; eases with sitting break, rest, and pain medications.  Pt reports frequent near falls at least once a day with 1 fall in the past 6 months; increased difficulty with unlevel surfaces, especially grass.  Pt is retired and ADLs encompass household chores and activities.  Pt would like to be able to return to walking around his neighborhood (1 mile) and kayaking.  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  Pt denies N/V, B&B changes, unexplained weight fluctuation, saddle paresthesia, fever, night sweats, or unrelenting night pain at this time.    Limitations Lifting;Standing;House hold activities;Walking    How long can you sit comfortably? Unlimited    How long can you stand comfortably? 10 minutes    How long can you walk comfortably? 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 PT educated patient on parameters of therex (how/when to inc/decrease intensity, frequency, rep/set range, stretch hold time, and purpose of therex) with verbalized understanding.  Access Code: [email or web address] Mini Squat with Chair - 1 x daily - 2 x weekly - 3 sets - 10 reps Supine Bridge - 1 x daily - 2 x weekly - 3 sets - 10 reps Step Up - 1 x daily - 2 x weekly - 3 sets - 10 reps Standing Tandem Balance with Counter Support - 1 x daily - 7 x weekly - 3 reps - 30sec hold Standing Lumbar Extension - 8 x daily - 7 x weekly - 10  reps                               PT Education - 10/18/19 1318    Education provided Yes    Education Details d/c recommendations    Person(s) Educated Patient    Methods Explanation;Demonstration;Verbal cues    Comprehension Verbalized understanding;Returned demonstration;Verbal cues required            PT Short Term Goals - 10/18/19 1304      PT SHORT TERM GOAL #1   Title Pt will be independent with HEP in order to improve strength and decrease back pain in order to improve pain-free function at home and work.     Baseline 10/18/19 completing    Time 4    Period Weeks    Status Achieved             PT Long Term Goals - 10/18/19 1304      PT LONG TERM GOAL #1   Title Pt will decrease worst back pain as reported on NPRS by at least 2 points in order to demonstrate clinically significant reduction in back pain.     Baseline 08/15/19 Worst 5/10; 09/26/19 1-2/10    Time 8    Period Weeks    Status Achieved      PT LONG TERM GOAL #2   Title Pt will ambulate for 30 minutes on outdoor pavement with LBP 3/10 or less to demonstrate decreased pain with prolonged walking.    Baseline 08/15/19 Pt requires rest break after 2-5 minutes of ambulation d/t LBP; 09/26/19 reports walking 1hour through grocery store without pain    Time 8    Period Weeks    Status Achieved      PT LONG TERM GOAL #3   Title Pt will increase 10MWT by at least 0.13 m/s in order to demonstrate clinically significant improvement in community ambulation.     Baseline 08/15/19 0.67 m/s; 09/26/19 1.56ms    Time 8    Period Weeks    Status Achieved      PT LONG TERM GOAL #4   Title Pt will decrease 5TSTS time to 10sec without UE support in order to demonstrate age matched norms    Baseline 04/04/18 11sec w/ UE support    Time 8    Period Weeks    Status Achieved      PT LONG TERM GOAL #5   Title Patient will demonstrate 10sec or less for TUG test to demonstrate decreased fall risk     Baseline 04/04/18 9.5sec    Time 8  Period Weeks    Status Achieved                 Plan - 10/18/19 1326    Clinical Impression Statement PT reassessed goals this session where patient has met all goals to safely d/c PT. PT reviewed HEP with patient where patient is able to demonstrate and verbalize understanding of all HEP parameteres. Pt gien clinic contact info should any further questions or concerns arise. Pt to d.c PT    Personal Factors and Comorbidities Age;Comorbidity 1;Comorbidity 2;Comorbidity 3+    Comorbidities DM, HTN, overweight    Examination-Activity Limitations Locomotion Level;Caring for Others;Stand;Stairs;Squat;Bend    Examination-Participation Restrictions Art gallery manager;Yard Work;Laundry;Other    Stability/Clinical Decision Making Evolving/Moderate complexity    Clinical Decision Making Moderate    Rehab Potential Fair    Clinical Impairments Affecting Rehab Potential Negative: Chronic condition, co-morbidities. Positive: motivated to improve, good results from previous PT.     PT Frequency 2x / week    PT Duration 8 weeks    PT Treatment/Interventions ADLs/Self Care Home Management;Cryotherapy;Electrical Stimulation;Moist Heat;DME Instruction;Traction;Gait training;Stair training;Functional mobility training;Therapeutic activities;Neuromuscular re-education;Balance training;Therapeutic exercise;Patient/family education;Manual techniques;Passive range of motion;Dry needling;Energy conservation    PT Next Visit Plan Trunk rotation core strengthening on tball, dynamic balance, tandem gait    PT Home Exercise Plan posterior pelvic tilt, lumbar ext, modified tandem standing endurance with counter support    Consulted and Agree with Plan of Care Patient           Patient will benefit from skilled therapeutic intervention in order to improve the following deficits and impairments:  Abnormal gait, Decreased balance, Decreased endurance, Decreased  mobility, Difficulty walking, Hypomobility, Impaired sensation, Decreased range of motion, Improper body mechanics, Decreased activity tolerance, Decreased coordination, Decreased strength, Impaired flexibility, Postural dysfunction, Pain  Visit Diagnosis: Chronic midline low back pain with right-sided sciatica  Difficulty in walking, not elsewhere classified  Chronic midline low back pain without sciatica     Problem List Patient Active Problem List   Diagnosis Date Noted  . Need for shingles vaccine 01/22/2019  . Need for vaccination against Streptococcus pneumoniae using pneumococcal conjugate vaccine 7 01/22/2019  . Encounter for general adult medical examination with abnormal findings 01/18/2018  . Abnormal liver function 01/18/2018  . S/P laparoscopic cholecystectomy 01/18/2018  . Bradycardia 01/18/2018  . Cutaneous candidiasis 01/18/2018  . Oral candidiasis 01/18/2018  . Septic shock ([place]) 01/08/2018  . Insect bite of right lower leg 09/25/2017  . Atopic dermatitis 09/25/2017  . PAD (peripheral artery disease) ([place]) 11/24/2015  . Type 2 diabetes mellitus with hyperglycemia, without long-term current use of insulin ([place]) 11/24/2015  . Hyperlipidemia 11/24/2015  . Essential hypertension 11/24/2015  . PVD (peripheral vascular disease) ([place]) 10/29/2015   Durwin Reges DPT Durwin Reges 10/18/2019, 1:33 PM  [place] [facility] [address] [place], [address] Phone: [phone]   Fax:  [phone]  Name: Allen [name] MRN: [id number] Date of Birth: 1940-03-13

## 2019-10-24 ENCOUNTER — Ambulatory Visit: Payer: Medicare Other | Admitting: Physical Therapy

## 2019-10-25 DIAGNOSIS — E1169 Type 2 diabetes mellitus with other specified complication: Secondary | ICD-10-CM | POA: Diagnosis not present

## 2019-10-25 DIAGNOSIS — N181 Chronic kidney disease, stage 1: Secondary | ICD-10-CM | POA: Diagnosis not present

## 2019-10-25 DIAGNOSIS — E1122 Type 2 diabetes mellitus with diabetic chronic kidney disease: Secondary | ICD-10-CM | POA: Diagnosis not present

## 2019-10-25 DIAGNOSIS — E1159 Type 2 diabetes mellitus with other circulatory complications: Secondary | ICD-10-CM | POA: Diagnosis not present

## 2019-10-25 DIAGNOSIS — E669 Obesity, unspecified: Secondary | ICD-10-CM | POA: Diagnosis not present

## 2019-10-25 DIAGNOSIS — I152 Hypertension secondary to endocrine disorders: Secondary | ICD-10-CM | POA: Diagnosis not present

## 2019-10-25 DIAGNOSIS — E039 Hypothyroidism, unspecified: Secondary | ICD-10-CM | POA: Diagnosis not present

## 2019-10-25 DIAGNOSIS — Z794 Long term (current) use of insulin: Secondary | ICD-10-CM | POA: Diagnosis not present

## 2019-10-25 DIAGNOSIS — E785 Hyperlipidemia, unspecified: Secondary | ICD-10-CM | POA: Diagnosis not present

## 2019-11-23 DIAGNOSIS — Z23 Encounter for immunization: Secondary | ICD-10-CM | POA: Diagnosis not present

## 2019-11-26 DIAGNOSIS — Z23 Encounter for immunization: Secondary | ICD-10-CM | POA: Diagnosis not present

## 2019-12-19 ENCOUNTER — Other Ambulatory Visit: Payer: Self-pay

## 2019-12-19 ENCOUNTER — Ambulatory Visit (INDEPENDENT_AMBULATORY_CARE_PROVIDER_SITE_OTHER): Payer: Medicare Other

## 2019-12-19 DIAGNOSIS — G4733 Obstructive sleep apnea (adult) (pediatric): Secondary | ICD-10-CM

## 2019-12-19 NOTE — Progress Notes (Signed)
95 percentile pressure 9   95th percentile leak 28.0   apnea index 0.6 /hr  apnea-hypopnea index  1.8 /hr   total days used  >4 hr 90 days  total days used <4 hr 0 days  Total compliance 100 percent  He is doing great no roblems or questions at this time.

## 2020-01-01 ENCOUNTER — Other Ambulatory Visit (INDEPENDENT_AMBULATORY_CARE_PROVIDER_SITE_OTHER): Payer: Self-pay | Admitting: Nurse Practitioner

## 2020-01-01 DIAGNOSIS — I739 Peripheral vascular disease, unspecified: Secondary | ICD-10-CM

## 2020-01-03 ENCOUNTER — Other Ambulatory Visit: Payer: Self-pay

## 2020-01-03 ENCOUNTER — Ambulatory Visit (INDEPENDENT_AMBULATORY_CARE_PROVIDER_SITE_OTHER): Payer: Medicare Other | Admitting: Vascular Surgery

## 2020-01-03 ENCOUNTER — Encounter (INDEPENDENT_AMBULATORY_CARE_PROVIDER_SITE_OTHER): Payer: Self-pay | Admitting: Vascular Surgery

## 2020-01-03 ENCOUNTER — Ambulatory Visit (INDEPENDENT_AMBULATORY_CARE_PROVIDER_SITE_OTHER): Payer: Medicare Other

## 2020-01-03 VITALS — BP 137/67 | HR 61 | Ht 67.0 in | Wt 217.0 lb

## 2020-01-03 DIAGNOSIS — E1165 Type 2 diabetes mellitus with hyperglycemia: Secondary | ICD-10-CM

## 2020-01-03 DIAGNOSIS — I2510 Atherosclerotic heart disease of native coronary artery without angina pectoris: Secondary | ICD-10-CM

## 2020-01-03 DIAGNOSIS — E785 Hyperlipidemia, unspecified: Secondary | ICD-10-CM

## 2020-01-03 DIAGNOSIS — I739 Peripheral vascular disease, unspecified: Secondary | ICD-10-CM

## 2020-01-03 DIAGNOSIS — I10 Essential (primary) hypertension: Secondary | ICD-10-CM | POA: Diagnosis not present

## 2020-01-03 NOTE — Progress Notes (Signed)
MRN : [id number]  Allen [name] is a 79 y.o. (22-Jan-1941) male who presents with chief complaint of No chief complaint on file. Marland Kitchen  History of Present Illness:   The patient returns to the office for followup and review of the noninvasive studies. There have been no interval changes in lower extremity symptoms. No interval shortening of the patient's claudication distance or development of rest pain symptoms. No new ulcers or wounds have occurred since the last visit.  There have been no significant changes to the patient's overall health care.  The patient denies amaurosis fugax or recent TIA symptoms. There are no recent neurological changes noted. The patient denies history of DVT, PE or superficial thrombophlebitis. The patient denies recent episodes of angina or shortness of breath.   ABI Rt=1.02 and Lt=0.56  (previous ABI's Rt=1.02 and Lt=0.50)   No outpatient medications have been marked as taking for the 01/03/20 encounter (Appointment) with Delana Meyer, Dolores Lory, MD.    Past Medical History:  Diagnosis Date   Coronary artery disease    Diabetes mellitus without complication ([place])    Heart disease    Hypertension    MI (myocardial infarction) ([place])    Sleep apnea     Past Surgical History:  Procedure Laterality Date   [place] Right    stent placement    Social History Social History   Tobacco Use   Smoking status: Former Smoker    Packs/day: 3.00    Years: 32.00    Pack years: 96.00    Types: Cigarettes   Smokeless tobacco: Never Used  Scientific laboratory technician Use: Never used  Substance Use Topics   Alcohol use: No   Drug use: No    Family History Family History  Problem Relation Age of Onset   Diabetes Mother    Brain cancer Mother    Throat cancer Father    Prostate cancer Brother     Allergies  Allergen Reactions   Iodinated Diagnostic  Agents Anaphylaxis and Rash    TOLERATED CT CONTRAST ON 01/07/2018 WITH NO ISSUES AFTER 10 MINUTE PRETREATMENT X-Ray contrast dyes- anaphylaxis & rash X-Ray contrast dyes- anaphylaxis & rash  X-Ray contrast dyes- anaphylaxis & rash TOLERATED CT CONTRAST ON 01/07/2018 WITH NO ISSUES AFTER 10 MINUTE PRETREATMENT X-Ray contrast dyes- anaphylaxis & rash X-Ray contrast dyes- anaphylaxis & rash   Iohexol Hives and Shortness Of Breath    Pt states that he had IV dye for a kidney x-ray years ago and after injection of the dye he had hives all over his body, throat swelling, and SOB. Pt states that he had IV dye for a kidney x-ray years ago and after injection of the dye he had hives all over his body, throat swelling, and SOB. Pt states that he had IV dye for a kidney x-ray years ago and after injection of the dye he had hives all over his body, throat swelling, and SOB.   Duloxetine Other (See Comments)    Sick to stomach and chills   Dye Fdc Red [Red Dye] Hives   Fluticasone Other (See Comments)    Caused nosebleeds   Gabapentin Other (See Comments)   Penicillins Other (See Comments)   Other Rash     REVIEW OF SYSTEMS (Negative unless checked)  Constitutional: [] Weight loss  [] Fever  [] Chills Cardiac: [] Chest pain   [] Chest pressure   []   Palpitations   [] Shortness of breath when laying flat   [] Shortness of breath with exertion. Vascular:  [x] Pain in legs with walking   [] Pain in legs at rest  [] History of DVT   [] Phlebitis   [] Swelling in legs   [] Varicose veins   [] Non-healing ulcers Pulmonary:   [] Uses home oxygen   [] Productive cough   [] Hemoptysis   [] Wheeze  [] COPD   [] Asthma Neurologic:  [] Dizziness   [] Seizures   [] History of stroke   [] History of TIA  [] Aphasia   [] Vissual changes   [] Weakness or numbness in arm   [x] Weakness or numbness in leg Musculoskeletal:   [] Joint swelling   [x] Joint pain   [] Low back pain Hematologic:  [] Easy bruising  [] Easy bleeding    [] Hypercoagulable state   [] Anemic Gastrointestinal:  [] Diarrhea   [] Vomiting  [] Gastroesophageal reflux/heartburn   [] Difficulty swallowing. Genitourinary:  [] Chronic kidney disease   [] Difficult urination  [] Frequent urination   [] Blood in urine Skin:  [] Rashes   [] Ulcers  Psychological:  [] History of anxiety   []  History of major depression.  Physical Examination  There were no vitals filed for this visit. There is no height or weight on file to calculate BMI. Gen: WD/WN, NAD Head: [place]/AT, No temporalis wasting.  Ear/Nose/Throat: Hearing grossly intact, nares w/o erythema or drainage Eyes: PER, EOMI, sclera nonicteric.  Neck: Supple, no large masses.   Pulmonary:  Good air movement, no audible wheezing bilaterally, no use of accessory muscles.  Cardiac: RRR, no JVD Vascular: scattered varicosities present bilaterally.  moderate venous stasis changes to the legs bilaterally.  2+ soft pitting edema.  Two small abrasions left ankle noninfected Vessel Right Left  Radial Palpable Palpable  PT Not Palpable Not Palpable  DP Not Palpable Not Palpable  Gastrointestinal: Non-distended. No guarding/no peritoneal signs.  Musculoskeletal: M/S 5/5 throughout.  No deformity or atrophy.  Neurologic: CN 2-12 intact. Symmetrical.  Speech is fluent. Motor exam as listed above. Psychiatric: Judgment intact, Mood & affect appropriate for pt's clinical situation. Dermatologic:Moderate venous rashes no ulcers noted.  No changes consistent with cellulitis. Lymph : No lichenification or skin changes of chronic lymphedema.  CBC Lab Results  Component Value Date   WBC 4.9 12/27/2018   HGB 13.6 12/27/2018   HCT 42.1 12/27/2018   MCV 100 (H) 12/27/2018   PLT 259 12/27/2018    BMET    Component Value Date/Time   NA 137 12/27/2018 1320   NA 132 (L) 10/04/2011 0756   K 4.9 12/27/2018 1320   K 4.3 10/04/2011 0756   CL 99 12/27/2018 1320   CL 95 (L) 10/04/2011 0756   CO2 23 12/27/2018 1320   CO2 29  10/04/2011 0756   GLUCOSE 204 (H) 12/27/2018 1320   GLUCOSE 134 (H) 01/08/2018 0056   GLUCOSE 182 (H) 10/04/2011 0756   BUN 23 12/27/2018 1320   BUN 23 (H) 10/04/2011 0756   CREATININE 1.19 12/27/2018 1320   CREATININE 0.97 10/04/2011 0756   CALCIUM 9.9 12/27/2018 1320   CALCIUM 9.5 10/04/2011 0756   GFRNONAA 59 (L) 12/27/2018 1320   GFRAA 68 12/27/2018 1320   CrCl cannot be calculated (Patient's most recent lab result is older than the maximum 21 days allowed.).  COAG No results found for: INR, PROTIME  Radiology No results found.   Assessment/Plan 1. PAD (peripheral artery disease) (HCC)  Recommend:  The patient has evidence of atherosclerosis of the lower extremities with claudication.  The patient does not voice lifestyle limiting changes at  this point in time.  Noninvasive studies do not suggest clinically significant change.  No invasive studies, angiography or surgery at this time The patient should continue walking and begin a more formal exercise program.  The patient should continue antiplatelet therapy and aggressive treatment of the lipid abnormalities  No changes in the patient's medications at this time  The patient should continue wearing graduated compression socks 10-15 mmHg strength to control the mild edema.   - VAS [place] ABI WITH/WO TBI; Future  2. Essential hypertension Continue antihypertensive medications as already ordered, these medications have been reviewed and there are no changes at this time.   3. Type 2 diabetes mellitus with hyperglycemia, without long-term current use of insulin (HCC) Continue hypoglycemic medications as already ordered, these medications have been reviewed and there are no changes at this time.  Hgb A1C to be monitored as already arranged by primary service   4. Hyperlipidemia, unspecified hyperlipidemia type Continue statin as ordered and reviewed, no changes at this time   Hortencia Pilar, MD  01/03/2020 12:57  PM

## 2020-01-14 DIAGNOSIS — M48062 Spinal stenosis, lumbar region with neurogenic claudication: Secondary | ICD-10-CM | POA: Diagnosis not present

## 2020-01-14 DIAGNOSIS — M5136 Other intervertebral disc degeneration, lumbar region: Secondary | ICD-10-CM | POA: Diagnosis not present

## 2020-01-14 DIAGNOSIS — M5416 Radiculopathy, lumbar region: Secondary | ICD-10-CM | POA: Diagnosis not present

## 2020-01-14 DIAGNOSIS — Z79899 Other long term (current) drug therapy: Secondary | ICD-10-CM | POA: Diagnosis not present

## 2020-01-21 ENCOUNTER — Ambulatory Visit (INDEPENDENT_AMBULATORY_CARE_PROVIDER_SITE_OTHER): Payer: Medicare Other | Admitting: Hospice and Palliative Medicine

## 2020-01-21 ENCOUNTER — Encounter: Payer: Self-pay | Admitting: Hospice and Palliative Medicine

## 2020-01-21 ENCOUNTER — Other Ambulatory Visit: Payer: Self-pay

## 2020-01-21 VITALS — BP 140/70 | HR 66 | Temp 97.8°F | Resp 16 | Ht 68.0 in | Wt 210.0 lb

## 2020-01-21 DIAGNOSIS — E1165 Type 2 diabetes mellitus with hyperglycemia: Secondary | ICD-10-CM

## 2020-01-21 DIAGNOSIS — Z9989 Dependence on other enabling machines and devices: Secondary | ICD-10-CM | POA: Diagnosis not present

## 2020-01-21 DIAGNOSIS — G4733 Obstructive sleep apnea (adult) (pediatric): Secondary | ICD-10-CM

## 2020-01-21 DIAGNOSIS — Z7189 Other specified counseling: Secondary | ICD-10-CM

## 2020-01-21 DIAGNOSIS — Z6831 Body mass index (BMI) 31.0-31.9, adult: Secondary | ICD-10-CM

## 2020-01-21 DIAGNOSIS — I2510 Atherosclerotic heart disease of native coronary artery without angina pectoris: Secondary | ICD-10-CM | POA: Diagnosis not present

## 2020-01-21 NOTE — Patient Instructions (Signed)

## 2020-01-21 NOTE — Progress Notes (Signed)
[facility] [place], [place] [address]  Pulmonary Sleep Medicine   Office Visit Note  Patient Name: Allen [name] DOB: [birth year] MRN [id number]  Date of Service: 01/21/2020  Complaints/HPI: Patient is here for routine pulmonary follow-up Followed for OSA on CPAP Recent download with 100% compliance, AHI well controlled at 1.8 Denies any issues with CPAP therapy, no mouth dryness, congestion or headaches Feels rested during the day, sleeps well each night Cleans his machine by hand, changes tubing, masks and filters as directed  ROS  General: (-) fever, (-) chills, (-) night sweats, (-) weakness Skin: (-) rashes, (-) itching,. Eyes: (-) visual changes, (-) redness, (-) itching. Nose and Sinuses: (-) nasal stuffiness or itchiness, (-) postnasal drip, (-) nosebleeds, (-) sinus trouble. Mouth and Throat: (-) sore throat, (-) hoarseness. Neck: (-) swollen glands, (-) enlarged thyroid, (-) neck pain. Respiratory: - cough, (-) bloody sputum, - shortness of breath, - wheezing. Cardiovascular: - ankle swelling, (-) chest pain. Lymphatic: (-) lymph node enlargement. Neurologic: (-) numbness, (-) tingling. Psychiatric: (-) anxiety, (-) depression   Current Medication: Outpatient Encounter Medications as of 01/21/2020  Medication Sig Note  . aspirin 81 MG chewable tablet Chew by mouth daily.   Marland Kitchen b complex vitamins tablet Take 1 tablet by mouth daily.   . B-D ULTRA-FINE 33 LANCETS MISC Use 1 Lancet 3 (three) times daily. 11/24/2015: Received from: [facility]  . cetirizine (ZYRTEC) 10 MG tablet Take 10 mg by mouth daily.   . Cholecalciferol (VITAMIN D3) 1000 units CAPS Take by mouth.   . clotrimazole-betamethasone (LOTRISONE) cream Apply 1 application topically 2 (two) times daily.   . Continuous Blood Gluc Sensor (FREESTYLE LIBRE SENSOR SYSTEM) MISC Use 3 each every 10 (ten) days.   . enalapril (VASOTEC) 20 MG tablet Take 1 tablet (20 mg total)  by mouth daily.   Marland Kitchen ezetimibe-simvastatin (VYTORIN) 10-40 MG tablet Take 1 tablet by mouth daily.   . folic acid (FOLVITE) 284 MCG tablet Take 400 mcg by mouth daily.   . Garlic 1324 MG CAPS Take by mouth.   Marland Kitchen glipiZIDE (GLUCOTROL XL) 5 MG 24 hr tablet Take 1 tablet by mouth daily.   Marland Kitchen glucose blood (ONE TOUCH ULTRA TEST) test strip Check blood sugar 5x a day as directed   . hydrochlorothiazide (MICROZIDE) 12.5 MG capsule Take 12.5 mg by mouth daily.   . insulin aspart protamine - aspart (NOVOLOG 70/30 MIX) (70-30) 100 UNIT/ML FlexPen Inject into the skin.   Marland Kitchen insulin lispro (HUMALOG) 100 UNIT/ML injection Inject 6-34 Units into the skin 3 (three) times daily before meals.    . Insulin Pen Needle (BD PEN NEEDLE NANO U/F) 32G X 4 MM MISC USE AS DIRECTED FOUR TIMES A DAY WITH LEVEMIR FLEXTOUCH AND HUMALOG PEN.  DX CODE E11.29   . Insulin Syringe-Needle U-100 (BD INSULIN SYRINGE U/F) 31G X 5/16" 0.3 ML MISC Inject insulin [place] TID as indicated per sliding scale instuctions.   Marland Kitchen JARDIANCE 25 MG TABS tablet Take 0.5 mg by mouth daily.    Marland Kitchen levothyroxine (SYNTHROID, LEVOTHROID) 50 MCG tablet Take 50 mcg by mouth daily before breakfast.   . metFORMIN (GLUCOPHAGE) 1000 MG tablet Take 1,000 mg by mouth daily with breakfast.    . metFORMIN (GLUCOPHAGE) 500 MG tablet Take 1,000 mg by mouth daily with breakfast.   . Multiple Vitamin (MULTIVITAMIN WITH MINERALS) TABS tablet Take 1 tablet by mouth daily.   . mupirocin ointment (BACTROBAN) 2 % Apply topically  two times daily   . nystatin ointment (MYCOSTATIN) Apply 1 application topically 3 (three) times daily.   . pioglitazone (ACTOS) 30 MG tablet Take 30 mg by mouth daily.    . pneumococcal 23 valent vaccine (PNEUMOVAX 23) 25 MCG/0.5ML injection Inject 0.85ml IM once   . traMADol (ULTRAM) 50 MG tablet Take 100 mg by mouth daily.  11/24/2015: Received from: [place]: 1-2 po bid prn  . triamcinolone cream (KENALOG) 0.1 % Apply 1  application topically 2 (two) times daily.   . vitamin B-12 (CYANOCOBALAMIN) 1000 MCG tablet Take 1,000 mcg by mouth daily.   Marland Kitchen Zoster Vaccine Adjuvanted [facility]) injection Shingles vaccine series - inject IM as directed .   . metoprolol succinate (TOPROL-XL) 25 MG 24 hr tablet Take 1 tablet (25 mg total) by mouth daily.    No facility-administered encounter medications on file as of 01/21/2020.    Surgical History: Past Surgical History:  Procedure Laterality Date  . CARDIAC CATHETERIZATION  1989  . CARDIAC CATHETERIZATION  1990  . GALLBLADDER SURGERY    . LEG SURGERY Right    stent placement    Medical History: Past Medical History:  Diagnosis Date  . Coronary artery disease   . Diabetes mellitus without complication ([place])   . Heart disease   . Hypertension   . MI (myocardial infarction) ([place])   . Sleep apnea     Family History: Family History  Problem Relation Age of Onset  . Diabetes Mother   . Brain cancer Mother   . Throat cancer Father   . Prostate cancer Brother     Social History: Social History   Socioeconomic History  . Marital status: Married    Spouse name: Not on file  . Number of children: Not on file  . Years of education: Not on file  . Highest education level: Not on file  Occupational History  . Not on file  Tobacco Use  . Smoking status: Former Smoker    Packs/day: 3.00    Years: 32.00    Pack years: 96.00    Types: Cigarettes  . Smokeless tobacco: Never Used  Vaping Use  . Vaping Use: Never used  Substance and Sexual Activity  . Alcohol use: No  . Drug use: No  . Sexual activity: Not on file  Other Topics Concern  . Not on file  Social History Narrative  . Not on file   Social Determinants of Health   Financial Resource Strain:   . Difficulty of Paying Living Expenses: Not on file  Food Insecurity:   . Worried About Charity fundraiser in the Last Year: Not on file  . Ran Out of Food in the Last Year: Not on file   Transportation Needs:   . Lack of Transportation (Medical): Not on file  . Lack of Transportation (Non-Medical): Not on file  Physical Activity:   . Days of Exercise per Week: Not on file  . Minutes of Exercise per Session: Not on file  Stress:   . Feeling of Stress : Not on file  Social Connections:   . Frequency of Communication with Friends and Family: Not on file  . Frequency of Social Gatherings with Friends and Family: Not on file  . Attends Religious Services: Not on file  . Active Member of Clubs or Organizations: Not on file  . Attends Archivist Meetings: Not on file  . Marital Status: Not on file  Intimate Partner  Violence:   . Fear of Current or Ex-Partner: Not on file  . Emotionally Abused: Not on file  . Physically Abused: Not on file  . Sexually Abused: Not on file    Vital Signs: Blood pressure 140/70, pulse 66, temperature 97.8 F (36.6 C), resp. rate 16, height 5\' 8"  (1.727 m), weight 210 lb (95.3 kg), SpO2 98 %.  Examination: General Appearance: The patient is well-developed, well-nourished, and in no distress. Skin: Gross inspection of skin unremarkable. Head: normocephalic, no gross deformities. Eyes: no gross deformities noted. ENT: ears appear grossly normal no exudates. Neck: Supple. No thyromegaly. No LAD. Respiratory: Clear throughout, no rhonchi, wheezing or rales noted. Cardiovascular: Normal S1 and S2 without murmur or rub. Extremities: No cyanosis. pulses are equal. Neurologic: Alert and oriented. No involuntary movements.  LABS: No results found for this or any previous visit (from the past 2160 hour(s)).  Radiology: DG Chest 2 View  Result Date: 01/08/2018 CLINICAL DATA:  Fever yesterday. Vomiting today. Dizziness and chest pain. EXAM: CHEST - 2 VIEW COMPARISON:  07/06/2015 FINDINGS: Normal heart size and pulmonary vascularity. No focal airspace disease or consolidation in the lungs. No blunting of costophrenic angles. No  pneumothorax. Mediastinal contours appear intact. Calcification of the aorta. Degenerative changes in the spine and shoulders. IMPRESSION: No active cardiopulmonary disease. Electronically Signed   By: Lucienne Capers M.D.   On: 01/08/2018 01:35   CT Angio Chest/Abd/Pel for Dissection W and/or W/WO  Result Date: 01/08/2018 CLINICAL DATA:  Chest pain, upper abdominal pain with nausea and vomiting. Fever. EXAM: CT ANGIOGRAPHY CHEST, ABDOMEN AND PELVIS TECHNIQUE: Multidetector CT imaging through the chest, abdomen and pelvis was performed using the standard protocol during bolus administration of intravenous contrast. Multiplanar reconstructed images and MIPs were obtained and reviewed to evaluate the vascular anatomy. CONTRAST:  194mL ISOVUE-370 IOPAMIDOL (ISOVUE-370) INJECTION 76% COMPARISON:  None. FINDINGS: CTA CHEST FINDINGS Cardiovascular: No thoracic aortic aneurysm or thoracic aortic dissection. Scattered aortic atherosclerosis. Mild cardiomegaly. Three-vessel coronary artery calcifications. No pericardial effusion. No pulmonary embolism identified. Mediastinum/Nodes: No mass or enlarged lymph nodes seen within the mediastinum or perihilar regions. Esophagus appears normal. Trachea and central bronchi are unremarkable. Lungs/Pleura: Lungs are clear. No pleural effusion or pneumothorax. Musculoskeletal: No acute or suspicious osseous finding. Degenerative spondylosis throughout the thoracic spine, mild to moderate in degree. Review of the MIP images confirms the above findings. CTA ABDOMEN AND PELVIS FINDINGS VASCULAR Aorta: Normal caliber aorta without aneurysm, dissection, vasculitis or significant stenosis. Fairly extensive aortic atherosclerosis. Celiac: Celiac artery trunk is patent without evidence of aneurysm, dissection, vasculitis or significant stenosis. Normal contrast flow is seen to the RIGHT and LEFT hepatic arteries, although perhaps slightly diminished to the LEFT liver lobe. Normal  contrast flow is seen to the splenic artery. SMA: Patent without evidence of aneurysm, dissection, vasculitis or significant stenosis. Renals: Both renal arteries are patent without evidence of aneurysm, dissection, vasculitis, fibromuscular dysplasia or significant stenosis. IMA: Patent without evidence of aneurysm, dissection, vasculitis or significant stenosis. Inflow: Patent without evidence of aneurysm, dissection, vasculitis or significant stenosis. Veins: No obvious venous abnormality within the limitations of this arterial phase study. However, the main portal vein is prominent, measuring 1.9 cm diameter. Review of the MIP images confirms the above findings. NON-VASCULAR Hepatobiliary: Relatively diminished contrast enhancement of the LEFT liver lobe, compared to the RIGHT hepatic lobe. No focal mass or lesion within either liver lobe. Gallbladder walls appear thickened and there is at least mild pericholecystic edema suggesting acute cholecystitis.  Common bile duct dilatation. 3 mm calcific density at the level of the distal common bile duct, highly suspicious for obstructing CBD stone. Pancreas: Unremarkable. No pancreatic ductal dilatation or surrounding inflammatory changes. Spleen: Normal in size without focal abnormality. Adrenals/Urinary Tract: Adrenal glands appear normal. Kidneys are unremarkable without mass, stone or hydronephrosis. Bladder is unremarkable, partially decompressed. Stomach/Bowel: No dilated large or small bowel loops. No evidence of bowel wall inflammation. Appendix is normal. Mild diverticulosis of the sigmoid and descending colon without evidence of acute diverticulitis. Stomach is unremarkable, partially decompressed. Lymphatic: No enlarged lymph nodes seen in the abdomen or pelvis. Partially calcified structures within the upper abdomen are suspected to represent partially calcified lymph nodes indicating previous granulomatous infection, or chronic partially calcified blood  vessel collateral/varices. Reproductive: Prostate is unremarkable. Other: No abscess collection. No free intraperitoneal air. Musculoskeletal: No acute or suspicious osseous finding. Degenerative changes within the mid and lower lumbar spine, moderate in degree. Review of the MIP images confirms the above findings. IMPRESSION: 1. Common bile duct dilatation, measuring approximately 10 mm diameter. 3 mm calcific density at the level of the distal common bile duct, compatible with obstructing CBD stone. 2. Gallbladder walls appear thickened and there is at least mild pericholecystic edema suggesting an associated acute cholecystitis and/or cholangitis. 3. Relatively diminished contrast enhancement of the LEFT liver lobe, compared to the normal enhancement of the RIGHT hepatic lobe, likely delayed enhancement as there is normal contrast opacification of the LEFT hepatic artery branches, possibly related to shunting given the aforementioned cholecystitis/cholangitis. Recommend Doppler ultrasound of the portal vein to exclude the less likely possibility of portal vein thrombosis as a cause for this asymmetrically diminished enhancement. 4. Mild colonic diverticulosis without evidence of acute diverticulitis. 5. Coronary artery calcifications, particularly dense within the LEFT main and proximal LEFT anterior descending coronary arteries. Recommend correlation with any possible associated cardiac symptoms. 6. Mild cardiomegaly. Aortic Atherosclerosis (ICD10-I70.0). These results, considerations and recommendations were called by telephone at the time of interpretation on 01/08/2018 at 2:45 am to Dr. Darel Hong , who verbally acknowledged these results. Electronically Signed   By: Franki Cabot M.D.   On: 01/08/2018 02:52   US Abdomen Limited RUQ  Result Date: 01/08/2018 CLINICAL DATA:  Right upper quadrant pain. EXAM: ULTRASOUND ABDOMEN LIMITED RIGHT UPPER QUADRANT COMPARISON:  None. FINDINGS: Gallbladder: Mildly  thickened gallbladder wall with focal pericholecystic fluid and layering sludge. No discrete stones identified. Murphy's sign is negative. Common bile duct: Diameter: 8.8 mm, mildly dilated.  No filling defects identified. Liver: No focal lesion identified. Within normal limits in parenchymal echogenicity. Portal vein is patent on color Doppler imaging with normal direction of blood flow towards the liver. IMPRESSION: Gallbladder sludge, focal pericholecystic edema, and mild gallbladder wall thickening. Murphy's sign negative. Possibly chronic inflammation or stasis. Mild extrahepatic bile duct dilatation. No cause identified. Correlate with liver function studies. Electronically Signed   By: Lucienne Capers M.D.   On: 01/08/2018 03:29    No results found.  VAS [place] ABI WITH/WO TBI  Result Date: 01/07/2020 LOWER EXTREMITY DOPPLER STUDY  Performing Technologist: Concha Norway RVT  Examination Guidelines: A complete evaluation includes at minimum, Doppler waveform signals and systolic blood pressure reading at the level of bilateral brachial, anterior tibial, and posterior tibial arteries, when vessel segments are accessible. Bilateral testing is considered an integral part of a complete examination. Photoelectric Plethysmograph (PPG) waveforms and toe systolic pressure readings are included as required and additional duplex testing as needed. Limited examinations  for reoccurring indications may be performed as noted.  ABI Findings: +---------+------------------+-----+---------+--------+ Right    Rt Pressure (mmHg)IndexWaveform Comment  +---------+------------------+-----+---------+--------+ Brachial 144                                      +---------+------------------+-----+---------+--------+ ATA      143               0.99 triphasic         +---------+------------------+-----+---------+--------+ PTA      147               1.02 triphasic          +---------+------------------+-----+---------+--------+ Great Toe107               0.74 Normal            +---------+------------------+-----+---------+--------+ +---------+------------------+-----+----------+-------+ Left     Lt Pressure (mmHg)IndexWaveform  Comment +---------+------------------+-----+----------+-------+ ATA      74                0.51 monophasic        +---------+------------------+-----+----------+-------+ PTA      80                0.56 monophasic        +---------+------------------+-----+----------+-------+ Great Toe60                0.42 Abnormal          +---------+------------------+-----+----------+-------+ +-------+-----------+-----------+------------+------------+ ABI/TBIToday's ABIToday's TBIPrevious ABIPrevious TBI +-------+-----------+-----------+------------+------------+ Right  1.02       .74        1.02        .53          +-------+-----------+-----------+------------+------------+ Left   .56        .42        .50         .37          +-------+-----------+-----------+------------+------------+ Bilateral ABIs appear essentially unchanged compared to prior study on 07/06/2019.  Summary: Right: Resting right ankle-brachial index is within normal range. No evidence of significant right lower extremity arterial disease. The right toe-brachial index is normal. Left: Resting left ankle-brachial index indicates moderate left lower extremity arterial disease. The left toe-brachial index is abnormal.  *See table(s) above for measurements and observations.  Electronically signed by Hortencia Pilar MD on 01/07/2020 at 4:47:28 PM.   Final       Assessment and Plan: Patient Active Problem List   Diagnosis Date Noted  . Need for shingles vaccine 01/22/2019  . Need for vaccination against Streptococcus pneumoniae using pneumococcal conjugate vaccine 7 01/22/2019  . Encounter for general adult medical examination with abnormal findings  01/18/2018  . Abnormal liver function 01/18/2018  . S/P laparoscopic cholecystectomy 01/18/2018  . Bradycardia 01/18/2018  . Cutaneous candidiasis 01/18/2018  . Oral candidiasis 01/18/2018  . Septic shock ([place]) 01/08/2018  . Insect bite of right lower leg 09/25/2017  . Atopic dermatitis 09/25/2017  . PAD (peripheral artery disease) ([place]) 11/24/2015  . Type 2 diabetes mellitus with hyperglycemia, without long-term current use of insulin ([place]) 11/24/2015  . Hyperlipidemia 11/24/2015  . Essential hypertension 11/24/2015  . PVD (peripheral vascular disease) ([place]) 10/29/2015    1. OSA on CPAP Continue with nightly CPAP compliance  2. CPAP use counseling Discussed importance of adequate CPAP use as well as proper care and cleaning techniques of machine and all supplies.  3. BMI  31.0-31.9,adult Discussed the importance of weight management through healthy eating and daily exercise as tolerated. Discussed the negative effects obesity has on pulmonary health, cardiac health as well as overall general health and well being.   General Counseling: I have discussed the findings of the evaluation and examination with Effie Shy.  I have also discussed any further diagnostic evaluation thatmay be needed or ordered today. Xue verbalizes understanding of the findings of todays visit. We also reviewed his medications today and discussed drug interactions and side effects including but not limited excessive drowsiness and altered mental states. We also discussed that there is always a risk not just to him but also people around him. he has been encouraged to call the office with any questions or concerns that should arise related to todays visit.     Time spent: 30  I have personally obtained a history, examined the patient, evaluated laboratory and imaging results, formulated the assessment and plan and placed orders. This patient was seen by Casey Burkitt AGNP-C in Collaboration with Dr. Devona Konig as  a part of collaborative care agreement.    Allyne Gee, MD [facility] Pulmonary and Critical Care Sleep medicine

## 2020-01-22 ENCOUNTER — Encounter: Payer: Self-pay | Admitting: Hospice and Palliative Medicine

## 2020-01-23 ENCOUNTER — Other Ambulatory Visit: Payer: Self-pay

## 2020-01-23 MED ORDER — ENALAPRIL MALEATE 20 MG PO TABS
20.0000 mg | ORAL_TABLET | Freq: Every day | ORAL | 2 refills | Status: DC
Start: 2020-01-23 — End: 2020-04-15

## 2020-01-29 ENCOUNTER — Other Ambulatory Visit: Payer: Self-pay

## 2020-01-29 MED ORDER — METOPROLOL SUCCINATE ER 25 MG PO TB24: 25.0000 mg | ORAL_TABLET | Freq: Every day | ORAL | 1 refills | Status: DC

## 2020-01-29 MED ORDER — EZETIMIBE-SIMVASTATIN 10-40 MG PO TABS: 1.0000 | ORAL_TABLET | Freq: Every day | ORAL | 1 refills | Status: DC

## 2020-02-14 ENCOUNTER — Encounter: Payer: Self-pay | Admitting: Hospice and Palliative Medicine

## 2020-02-14 ENCOUNTER — Ambulatory Visit (INDEPENDENT_AMBULATORY_CARE_PROVIDER_SITE_OTHER): Payer: Medicare Other | Admitting: Hospice and Palliative Medicine

## 2020-02-14 VITALS — BP 132/64 | HR 60 | Temp 97.3°F | Resp 16 | Ht 68.0 in | Wt 219.2 lb

## 2020-02-14 DIAGNOSIS — I739 Peripheral vascular disease, unspecified: Secondary | ICD-10-CM

## 2020-02-14 DIAGNOSIS — R300 Dysuria: Secondary | ICD-10-CM

## 2020-02-14 DIAGNOSIS — Z79899 Other long term (current) drug therapy: Secondary | ICD-10-CM | POA: Diagnosis not present

## 2020-02-14 DIAGNOSIS — I2510 Atherosclerotic heart disease of native coronary artery without angina pectoris: Secondary | ICD-10-CM

## 2020-02-14 DIAGNOSIS — I10 Essential (primary) hypertension: Secondary | ICD-10-CM | POA: Diagnosis not present

## 2020-02-14 DIAGNOSIS — E1165 Type 2 diabetes mellitus with hyperglycemia: Secondary | ICD-10-CM

## 2020-02-14 DIAGNOSIS — Z0001 Encounter for general adult medical examination with abnormal findings: Secondary | ICD-10-CM | POA: Diagnosis not present

## 2020-02-14 LAB — POCT GLYCOSYLATED HEMOGLOBIN (HGB A1C): Hemoglobin A1C: 7.2 % — AB (ref 4.0–5.6)

## 2020-02-14 NOTE — Progress Notes (Signed)
[facility] [place], [place] [address]  Internal MEDICINE  Office Visit Note  Patient Name: Allen [name]  [id number]  [id number]  Date of Service: 02/21/2020  Chief Complaint  Patient presents with  . Medicare Wellness  . Diabetes  . Sleep Apnea  . Hypertension     HPI Pt is here for routine health maintenance examination Overall, things have been going well   Followed by endocrinology for DM Takes Humalog insulin twice a day as he only eats two meals a day--insulin averages between 25-35 units twice a day  Followed by vascular for PAD-stable, no intervention at this time, continue compression stockings  Followed by cardiology for CAD and s/p MI (1989), nocturnal bradycardia-SBP goal less than 130, LDL goal less than 35  Continues to wear his CPAP nightly, no issues with medications Sleeping well at night, misses spending time with his family due to social distancing for COVID, no recent changes in his appetite or elimination habits  PHM: Needs annual diabetic eye exam Not wanting to have colonoscopy--no longer recommended  Current Medication: Outpatient Encounter Medications as of 02/14/2020  Medication Sig Note  . aspirin 81 MG chewable tablet Chew by mouth daily.   Marland Kitchen b complex vitamins tablet Take 1 tablet by mouth daily.   . B-D ULTRA-FINE 33 LANCETS MISC Use 1 Lancet 3 (three) times daily. 11/24/2015: Received from: [place]  . cetirizine (ZYRTEC) 10 MG tablet Take 10 mg by mouth daily.   . Cholecalciferol (VITAMIN D3) 1000 units CAPS Take by mouth.   . clotrimazole-betamethasone (LOTRISONE) cream Apply 1 application topically 2 (two) times daily.   . Continuous Blood Gluc Sensor (FREESTYLE LIBRE SENSOR SYSTEM) MISC Use 3 each every 10 (ten) days.   . enalapril (VASOTEC) 20 MG tablet Take 1 tablet (20 mg total) by mouth daily.   Marland Kitchen ezetimibe-simvastatin (VYTORIN) 10-40 MG tablet Take 1 tablet by mouth daily.   . folic  acid (FOLVITE) 182 MCG tablet Take 400 mcg by mouth daily.   . Garlic 9937 MG CAPS Take by mouth.   Marland Kitchen glipiZIDE (GLUCOTROL XL) 5 MG 24 hr tablet Take 1 tablet by mouth daily.   Marland Kitchen glucose blood test strip Check blood sugar 5x a day as directed   . insulin lispro (HUMALOG) 100 UNIT/ML injection Inject 6-34 Units into the skin 3 (three) times daily before meals.    . Insulin Pen Needle 32G X 4 MM MISC USE AS DIRECTED FOUR TIMES A DAY WITH LEVEMIR FLEXTOUCH AND HUMALOG PEN.  DX CODE E11.29   . Insulin Syringe-Needle U-100 (BD INSULIN SYRINGE U/F) 31G X 5/16" 0.3 ML MISC Inject insulin [place] TID as indicated per sliding scale instuctions.   Marland Kitchen JARDIANCE 25 MG TABS tablet Take 0.5 mg by mouth daily.    Marland Kitchen levothyroxine (SYNTHROID, LEVOTHROID) 50 MCG tablet Take 50 mcg by mouth daily before breakfast.   . metFORMIN (GLUCOPHAGE) 1000 MG tablet Take 1,000 mg by mouth daily with breakfast.    . metFORMIN (GLUCOPHAGE) 500 MG tablet Take 1,000 mg by mouth daily with breakfast.   . metoprolol succinate (TOPROL-XL) 25 MG 24 hr tablet Take 1 tablet (25 mg total) by mouth daily.   . Multiple Vitamin (MULTIVITAMIN WITH MINERALS) TABS tablet Take 1 tablet by mouth daily.   . mupirocin ointment (BACTROBAN) 2 % Apply topically two times daily   . nystatin ointment (MYCOSTATIN) Apply 1 application topically 3 (three) times daily.   . pneumococcal 23 valent  vaccine (PNEUMOVAX 23) 25 MCG/0.5ML injection Inject 0.40m IM once   . traMADol (ULTRAM) 50 MG tablet Take 100 mg by mouth daily.  11/24/2015: Received from: [place] 1-2 po bid prn  . triamcinolone cream (KENALOG) 0.1 % Apply 1 application topically 2 (two) times daily.   . vitamin B-12 (CYANOCOBALAMIN) 1000 MCG tablet Take 1,000 mcg by mouth daily.   .Marland KitchenZoster Vaccine Adjuvanted ([facility] injection Shingles vaccine series - inject IM as directed .   . [DISCONTINUED] hydrochlorothiazide (MICROZIDE) 12.5 MG capsule Take 12.5 mg by mouth  daily.   . [DISCONTINUED] insulin aspart protamine - aspart (NOVOLOG 70/30 MIX) (70-30) 100 UNIT/ML FlexPen Inject into the skin.   . [DISCONTINUED] pioglitazone (ACTOS) 30 MG tablet Take 30 mg by mouth daily.     No facility-administered encounter medications on file as of 02/14/2020.    Surgical History: Past Surgical History:  Procedure Laterality Date  . CARDIAC CATHETERIZATION  1989  . CARDIAC CATHETERIZATION  1990  . GALLBLADDER SURGERY    . LEG SURGERY Right    stent placement    Medical History: Past Medical History:  Diagnosis Date  . Coronary artery disease   . Diabetes mellitus without complication ([place]   . Heart disease   . Hypertension   . [place]) [place]   . Sleep apnea     Family History: Family History  Problem Relation Age of Onset  . Diabetes Mother   . Brain cancer Mother   . Throat cancer Father   . Prostate cancer Brother       Review of Systems  Constitutional: Negative for chills, fatigue and unexpected weight change.  HENT: Negative for congestion, postnasal drip, rhinorrhea, sneezing and sore throat.   Eyes: Negative for redness.  Respiratory: Negative for cough, chest tightness and shortness of breath.   Cardiovascular: Negative for chest pain and palpitations.  Gastrointestinal: Negative for abdominal pain, constipation, diarrhea, nausea and vomiting.  Genitourinary: Negative for dysuria and frequency.  Musculoskeletal: Negative for arthralgias, back pain, joint swelling and neck pain.  Skin: Negative for rash.  Neurological: Negative for tremors and numbness.  Hematological: Negative for adenopathy. Does not bruise/bleed easily.  Psychiatric/Behavioral: Negative for behavioral problems (Depression), sleep disturbance and suicidal ideas. The patient is not nervous/anxious.      Vital Signs: BP 132/64   Pulse 60   Temp (!) 97.3 F (36.3 C)   Resp 16   Ht _0  (1.727 m)   Wt 219 lb 3.2 oz (99.4 kg)   SpO2 97%    BMI 33.33 kg/m    Physical Exam Vitals reviewed.  Constitutional:      Appearance: Normal appearance. He is obese.  HENT:     Right Ear: Tympanic membrane normal.     Left Ear: Tympanic membrane normal.     Nose: Nose normal.     Mouth/Throat:     Mouth: Mucous membranes are moist.     Pharynx: Oropharynx is clear.  Eyes:     Pupils: Pupils are equal, round, and reactive to light.  Cardiovascular:     Rate and Rhythm: Normal rate and regular rhythm.     Pulses: Normal pulses.     Heart sounds: Normal heart sounds.  Pulmonary:     Effort: Pulmonary effort is normal.     Breath sounds: Normal breath sounds.  Musculoskeletal:        General: Normal range of motion.     Cervical back:  Normal range of motion.  Skin:    General: Skin is warm.  Neurological:     General: No focal deficit present.     Mental Status: He is alert and oriented to person, place, and time. Mental status is at baseline.  Psychiatric:        Mood and Affect: Mood normal.        Behavior: Behavior normal.        Thought Content: Thought content normal.        Judgment: Judgment normal.      LABS: Recent Results (from the past 2160 hour(s))  UA/M w/rflx Culture, Routine     Status: Abnormal   Collection Time: 02/14/20  3:00 PM   Specimen: Urine   Urine  Result Value Ref Range   Specific Gravity, UA 1.022 1.005 - 1.030   pH, UA 5.0 5.0 - 7.5   Color, UA Yellow Yellow   Appearance Ur Clear Clear   Leukocytes,UA Negative Negative   Protein,UA Negative Negative/Trace   Glucose, UA 3+ (A) Negative   Ketones, UA Negative Negative   RBC, UA Negative Negative   Bilirubin, UA Negative Negative   Urobilinogen, Ur 0.2 0.2 - 1.0 mg/dL   Nitrite, UA Negative Negative   Microscopic Examination Comment     Comment: Microscopic follows if indicated.   Microscopic Examination See below:     Comment: Microscopic was indicated and was performed.   Urinalysis Reflex Comment     Comment: This specimen  will not reflex to a Urine Culture.  Microscopic Examination     Status: None   Collection Time: 02/14/20  3:00 PM   Urine  Result Value Ref Range   WBC, UA None seen 0 - 5 /hpf   RBC None seen 0 - 2 /hpf   Epithelial Cells (non renal) None seen 0 - 10 /hpf   Casts None seen None seen /lpf   Bacteria, UA None seen None seen/Few  POCT glycosylated hemoglobin (Hb A1C)     Status: Abnormal   Collection Time: 02/14/20  4:11 PM  Result Value Ref Range   Hemoglobin A1C 7.2 (A) 4.0 - 5.6 %   HbA1c POC (<> result, manual entry)     HbA1c, POC (prediabetic range)     HbA1c, POC (controlled diabetic range)    CBC w/Diff/Platelet     Status: Abnormal   Collection Time: 02/18/20  8:46 AM  Result Value Ref Range   WBC 7.2 3.4 - 10.8 x10E3/uL   RBC 4.11 (L) 4.14 - 5.80 x10E6/uL   Hemoglobin 13.4 13.0 - 17.7 g/dL   Hematocrit 40.6 37.5 - 51.0 %   MCV 99 (H) 79 - 97 fL   MCH 32.6 26.6 - 33.0 pg   MCHC 33.0 31.5 - 35.7 g/dL   RDW 11.8 11.6 - 15.4 %   Platelets 256 150 - 450 x10E3/uL   Neutrophils 71 Not Estab. %   Lymphs 16 Not Estab. %   Monocytes 10 Not Estab. %   Eos 2 Not Estab. %   Basos 1 Not Estab. %   Neutrophils Absolute 5.1 1.4 - 7.0 x10E3/uL   Lymphocytes Absolute 1.2 0.7 - 3.1 x10E3/uL   Monocytes Absolute 0.8 0.1 - 0.9 x10E3/uL   EOS (ABSOLUTE) 0.2 0.0 - 0.4 x10E3/uL   Basophils Absolute 0.1 0.0 - 0.2 x10E3/uL   Immature Granulocytes 0 Not Estab. %   Immature Grans (Abs) 0.0 0.0 - 0.1 x10E3/uL  Comprehensive Metabolic Panel (CMET)  Status: Abnormal   Collection Time: 02/18/20  8:46 AM  Result Value Ref Range   Glucose 156 (H) 65 - 99 mg/dL   BUN 21 8 - 27 mg/dL   Creatinine, Ser 1.33 (H) 0.76 - 1.27 mg/dL   GFR calc non Af Amer 51 (L) >59 mL/min/1.73   GFR calc Af Amer 59 (L) >59 mL/min/1.73    Comment: **In accordance with recommendations from the NKF-ASN Task force,**   [facility] is in the process of updating its eGFR calculation to the   2021 CKD-EPI creatinine  equation that estimates kidney function   without a race variable.    BUN/Creatinine Ratio 16 10 - 24   Sodium 139 134 - 144 mmol/L   Potassium 5.0 3.5 - 5.2 mmol/L   Chloride 101 96 - 106 mmol/L   CO2 23 20 - 29 mmol/L   Calcium 9.4 8.6 - 10.2 mg/dL   Total Protein 6.6 6.0 - 8.5 g/dL   Albumin 4.3 3.7 - 4.7 g/dL   Globulin, Total 2.3 1.5 - 4.5 g/dL   Albumin/Globulin Ratio 1.9 1.2 - 2.2   Bilirubin Total 0.5 0.0 - 1.2 mg/dL   Alkaline Phosphatase 79 44 - 121 IU/L    Comment:               **Please note reference interval change**   AST 16 0 - 40 IU/L   ALT 13 0 - 44 IU/L  Lipid Panel With LDL/HDL Ratio     Status: None   Collection Time: 02/18/20  8:46 AM  Result Value Ref Range   Cholesterol, Total 126 100 - 199 mg/dL   Triglycerides 82 0 - 149 mg/dL   HDL 64 >39 mg/dL   VLDL Cholesterol Cal 16 5 - 40 mg/dL   LDL Chol Calc (NIH) 46 0 - 99 mg/dL   LDL/HDL Ratio 0.7 0.0 - 3.6 ratio    Comment:                                     LDL/HDL Ratio                                             Men  Women                               1/2 Avg.Risk  1.0    1.5                                   Avg.Risk  3.6    3.2                                2X Avg.Risk  6.2    5.0                                3X Avg.Risk  8.0    6.1   TSH + free T4     Status: Abnormal   Collection Time: 02/18/20  8:46 AM  Result Value Ref Range   TSH 4.950 (  H) 0.450 - 4.500 uIU/mL   Free T4 1.44 0.82 - 1.77 ng/dL  PSA     Status: None   Collection Time: 02/18/20  8:46 AM  Result Value Ref Range   Prostate Specific Ag, Serum 0.5 0.0 - 4.0 ng/mL    Comment: Roche ECLIA methodology. According to the American Urological Association, Serum PSA should decrease and remain at undetectable levels after radical prostatectomy. The AUA defines biochemical recurrence as an initial PSA value 0.2 ng/mL or greater followed by a subsequent confirmatory PSA value 0.2 ng/mL or greater. Values obtained with different assay  methods or kits cannot be used interchangeably. Results cannot be interpreted as absolute evidence of the presence or absence of malignant disease.     Assessment/Plan: 1. Encounter for routine adult health examination with abnormal findings Well appearing 79 year old male Will review routine labs and adjust therapy as needed - CBC w/Diff/Platelet - Comprehensive Metabolic Panel (CMET) - Lipid Panel With LDL/HDL Ratio - TSH + free T4 - PSA  2. Essential hypertension BP and HR well controlled today, continue current therapy and routine monitoring  3. Type 2 diabetes mellitus with hyperglycemia, without long-term current use of insulin ([place]) Managed by endocrinology, needs updated annual diabetic eye exam A1C 7.2 - POCT glycosylated hemoglobin (Hb A1C) - Ambulatory referral to Ophthalmology  4. PVD (peripheral vascular disease) (HCC) Stable at this time, no evidence of claudication, followed by vascular  5. Coronary artery disease involving native coronary artery of native heart without angina pectoris Continue statin therapy, followed by cardiology  6. High risk medication use - CBC w/Diff/Platelet - Comprehensive Metabolic Panel (CMET) - Lipid Panel With LDL/HDL Ratio - TSH + free T4  7. Dysuria - UA/M w/rflx Culture, Routine - Microscopic Examination  General Counseling: clemons salvucci understanding of the findings of todays visit and agrees with plan of treatment. I have discussed any further diagnostic evaluation that may be needed or ordered today. We also reviewed his medications today. he has been encouraged to call the office with any questions or concerns that should arise related to todays visit.    Counseling:    Orders Placed This Encounter  Procedures  . Microscopic Examination  . UA/M w/rflx Culture, Routine  . CBC w/Diff/Platelet  . Comprehensive Metabolic Panel (CMET)  . Lipid Panel With LDL/HDL Ratio  . TSH + free T4  . PSA  . Ambulatory  referral to Ophthalmology  . POCT glycosylated hemoglobin (Hb A1C)      Total time spent: 35 Minutes  Time spent includes review of chart, medications, test results, and follow up plan with the patient.   This patient was seen by Casey Burkitt AGNP-C Collaboration with Dr Lavera Guise as a part of collaborative care agreement   Tanna Furry. [facility]

## 2020-02-16 LAB — UA/M W/RFLX CULTURE, ROUTINE
Bilirubin, UA: NEGATIVE
Ketones, UA: NEGATIVE
Leukocytes,UA: NEGATIVE
Nitrite, UA: NEGATIVE
Protein,UA: NEGATIVE
RBC, UA: NEGATIVE
Specific Gravity, UA: 1.022 (ref 1.005–1.030)
Urobilinogen, Ur: 0.2 mg/dL (ref 0.2–1.0)
pH, UA: 5 (ref 5.0–7.5)

## 2020-02-16 LAB — MICROSCOPIC EXAMINATION
Bacteria, UA: NONE SEEN
Casts: NONE SEEN /lpf
Epithelial Cells (non renal): NONE SEEN /hpf (ref 0–10)
RBC, Urine: NONE SEEN /hpf (ref 0–2)
WBC, UA: NONE SEEN /hpf (ref 0–5)

## 2020-02-18 DIAGNOSIS — Z0001 Encounter for general adult medical examination with abnormal findings: Secondary | ICD-10-CM | POA: Diagnosis not present

## 2020-02-18 DIAGNOSIS — Z79899 Other long term (current) drug therapy: Secondary | ICD-10-CM | POA: Diagnosis not present

## 2020-02-19 LAB — CBC WITH DIFFERENTIAL/PLATELET
Basophils Absolute: 0.1 10*3/uL (ref 0.0–0.2)
Basos: 1 %
EOS (ABSOLUTE): 0.2 10*3/uL (ref 0.0–0.4)
Eos: 2 %
Hematocrit: 40.6 % (ref 37.5–51.0)
Hemoglobin: 13.4 g/dL (ref 13.0–17.7)
Immature Grans (Abs): 0 10*3/uL (ref 0.0–0.1)
Immature Granulocytes: 0 %
Lymphocytes Absolute: 1.2 10*3/uL (ref 0.7–3.1)
Lymphs: 16 %
MCH: 32.6 pg (ref 26.6–33.0)
MCHC: 33 g/dL (ref 31.5–35.7)
MCV: 99 fL — ABNORMAL HIGH (ref 79–97)
Monocytes Absolute: 0.8 10*3/uL (ref 0.1–0.9)
Monocytes: 10 %
Neutrophils Absolute: 5.1 10*3/uL (ref 1.4–7.0)
Neutrophils: 71 %
Platelets: 256 10*3/uL (ref 150–450)
RBC: 4.11 x10E6/uL — ABNORMAL LOW (ref 4.14–5.80)
RDW: 11.8 % (ref 11.6–15.4)
WBC: 7.2 10*3/uL (ref 3.4–10.8)

## 2020-02-19 LAB — COMPREHENSIVE METABOLIC PANEL
ALT: 13 IU/L (ref 0–44)
AST: 16 IU/L (ref 0–40)
Albumin/Globulin Ratio: 1.9 (ref 1.2–2.2)
Albumin: 4.3 g/dL (ref 3.7–4.7)
Alkaline Phosphatase: 79 IU/L (ref 44–121)
BUN/Creatinine Ratio: 16 (ref 10–24)
BUN: 21 mg/dL (ref 8–27)
Bilirubin Total: 0.5 mg/dL (ref 0.0–1.2)
CO2: 23 mmol/L (ref 20–29)
Calcium: 9.4 mg/dL (ref 8.6–10.2)
Chloride: 101 mmol/L (ref 96–106)
Creatinine, Ser: 1.33 mg/dL — ABNORMAL HIGH (ref 0.76–1.27)
GFR calc Af Amer: 59 mL/min/{1.73_m2} — ABNORMAL LOW (ref >59)
GFR calc non Af Amer: 51 mL/min/{1.73_m2} — ABNORMAL LOW (ref >59)
Globulin, Total: 2.3 g/dL (ref 1.5–4.5)
Glucose: 156 mg/dL — ABNORMAL HIGH (ref 65–99)
Potassium: 5 mmol/L (ref 3.5–5.2)
Sodium: 139 mmol/L (ref 134–144)
Total Protein: 6.6 g/dL (ref 6.0–8.5)

## 2020-02-19 LAB — LIPID PANEL WITH LDL/HDL RATIO
Cholesterol, Total: 126 mg/dL (ref 100–199)
HDL: 64 mg/dL (ref >39)
LDL Chol Calc (NIH): 46 mg/dL (ref 0–99)
LDL/HDL Ratio: 0.7 ratio (ref 0.0–3.6)
Triglycerides: 82 mg/dL (ref 0–149)
VLDL Cholesterol Cal: 16 mg/dL (ref 5–40)

## 2020-02-19 LAB — TSH+FREE T4
Free T4: 1.44 ng/dL (ref 0.82–1.77)
TSH: 4.95 u[IU]/mL — ABNORMAL HIGH (ref 0.450–4.500)

## 2020-02-19 LAB — PSA: Prostate Specific Ag, Serum: 0.5 ng/mL (ref 0.0–4.0)

## 2020-02-19 NOTE — Progress Notes (Signed)
Please call patient and let him know I would like to discuss his labs with him in person. Can we get him scheduled for a follow-up? Thanks!

## 2020-02-20 ENCOUNTER — Ambulatory Visit (INDEPENDENT_AMBULATORY_CARE_PROVIDER_SITE_OTHER): Payer: Medicare Other

## 2020-02-20 ENCOUNTER — Other Ambulatory Visit: Payer: Self-pay

## 2020-02-20 ENCOUNTER — Telehealth: Payer: Self-pay

## 2020-02-20 DIAGNOSIS — G4733 Obstructive sleep apnea (adult) (pediatric): Secondary | ICD-10-CM

## 2020-02-20 NOTE — Progress Notes (Signed)
Cpap machine running fine no problems, checked to make sure all connections are good and cpap pressure is correct. No other problems or questions

## 2020-02-20 NOTE — Telephone Encounter (Signed)
Pt advised that we like to see him to discuss lab in person and gave beth for appt

## 2020-02-20 NOTE — Telephone Encounter (Signed)
-----   Message from Allen Ochoa, NP sent at 02/19/2020 12:39 PM EST ----- Please call patient and let him know I would like to discuss his labs with him in person. Can we get him scheduled for a follow-up? Thanks!

## 2020-02-21 ENCOUNTER — Ambulatory Visit (INDEPENDENT_AMBULATORY_CARE_PROVIDER_SITE_OTHER): Payer: Medicare Other | Admitting: Hospice and Palliative Medicine

## 2020-02-21 ENCOUNTER — Encounter: Payer: Self-pay | Admitting: Hospice and Palliative Medicine

## 2020-02-21 VITALS — BP 135/77 | HR 65 | Temp 98.0°F | Resp 16 | Ht 68.0 in | Wt 219.0 lb

## 2020-02-21 DIAGNOSIS — I10 Essential (primary) hypertension: Secondary | ICD-10-CM

## 2020-02-21 DIAGNOSIS — N1831 Chronic kidney disease, stage 3a: Secondary | ICD-10-CM | POA: Diagnosis not present

## 2020-02-21 DIAGNOSIS — E1165 Type 2 diabetes mellitus with hyperglycemia: Secondary | ICD-10-CM | POA: Diagnosis not present

## 2020-02-21 DIAGNOSIS — R944 Abnormal results of kidney function studies: Secondary | ICD-10-CM

## 2020-02-21 NOTE — Progress Notes (Signed)
[facility] [place], [place] [address]  Internal MEDICINE  Office Visit Note  Patient Name: Allen [name]  [id number]  [id number]  Date of Service: 02/22/2020  Chief Complaint  Patient presents with  . Follow-up  . Diabetes  . Hypertension    HPI Patient is here for routine follow-up Follow-up regarding recent labs--abnormal kidney function, declining GFR  Discussed since 2019 there has been a slow decline in his kidney function  Reviewed his medications--Metformin potentially contributing to kidney function decline, he denies excessive use of ibuprofen or other NSAIDs HCTZ discontinued at previous visit due to hypotension   Current Medication: Outpatient Encounter Medications as of 02/21/2020  Medication Sig Note  . aspirin 81 MG chewable tablet Chew by mouth daily.   Marland Kitchen b complex vitamins tablet Take 1 tablet by mouth daily.   . B-D ULTRA-FINE 33 LANCETS MISC Use 1 Lancet 3 (three) times daily. 11/24/2015: Received from: [place]  . cetirizine (ZYRTEC) 10 MG tablet Take 10 mg by mouth daily.   . Cholecalciferol (VITAMIN D3) 1000 units CAPS Take by mouth.   . clotrimazole-betamethasone (LOTRISONE) cream Apply 1 application topically 2 (two) times daily.   . Continuous Blood Gluc Sensor (FREESTYLE LIBRE SENSOR SYSTEM) MISC Use 3 each every 10 (ten) days.   . enalapril (VASOTEC) 20 MG tablet Take 1 tablet (20 mg total) by mouth daily.   Marland Kitchen ezetimibe-simvastatin (VYTORIN) 10-40 MG tablet Take 1 tablet by mouth daily.   . folic acid (FOLVITE) 867 MCG tablet Take 400 mcg by mouth daily.   . Garlic 6720 MG CAPS Take by mouth.   Marland Kitchen glipiZIDE (GLUCOTROL XL) 5 MG 24 hr tablet Take 1 tablet by mouth daily.   Marland Kitchen glucose blood test strip Check blood sugar 5x a day as directed   . insulin lispro (HUMALOG) 100 UNIT/ML injection Inject 6-34 Units into the skin 3 (three) times daily before meals.    . Insulin Pen Needle 32G X 4 MM MISC USE AS DIRECTED  FOUR TIMES A DAY WITH LEVEMIR FLEXTOUCH AND HUMALOG PEN.  DX CODE E11.29   . Insulin Syringe-Needle U-100 (BD INSULIN SYRINGE U/F) 31G X 5/16" 0.3 ML MISC Inject insulin [place] TID as indicated per sliding scale instuctions.   Marland Kitchen JARDIANCE 25 MG TABS tablet Take 0.5 mg by mouth daily.    Marland Kitchen levothyroxine (SYNTHROID, LEVOTHROID) 50 MCG tablet Take 50 mcg by mouth daily before breakfast.   . metFORMIN (GLUCOPHAGE) 1000 MG tablet Take 1,000 mg by mouth daily with breakfast.    . metFORMIN (GLUCOPHAGE) 500 MG tablet Take 1,000 mg by mouth daily with breakfast.   . metoprolol succinate (TOPROL-XL) 25 MG 24 hr tablet Take 1 tablet (25 mg total) by mouth daily.   . Multiple Vitamin (MULTIVITAMIN WITH MINERALS) TABS tablet Take 1 tablet by mouth daily.   . mupirocin ointment (BACTROBAN) 2 % Apply topically two times daily   . nystatin ointment (MYCOSTATIN) Apply 1 application topically 3 (three) times daily.   . pneumococcal 23 valent vaccine (PNEUMOVAX 23) 25 MCG/0.5ML injection Inject 0.52ml IM once   . traMADol (ULTRAM) 50 MG tablet Take 100 mg by mouth daily.  11/24/2015: Received from: [place]: 1-2 po bid prn  . triamcinolone cream (KENALOG) 0.1 % Apply 1 application topically 2 (two) times daily.   . vitamin B-12 (CYANOCOBALAMIN) 1000 MCG tablet Take 1,000 mcg by mouth daily.   Marland Kitchen Zoster Vaccine Adjuvanted [facility]) injection Shingles  vaccine series - inject IM as directed .   . [DISCONTINUED] hydrochlorothiazide (MICROZIDE) 12.5 MG capsule Take 12.5 mg by mouth daily.    No facility-administered encounter medications on file as of 02/21/2020.    Surgical History: Past Surgical History:  Procedure Laterality Date  . CARDIAC CATHETERIZATION  1989  . CARDIAC CATHETERIZATION  1990  . GALLBLADDER SURGERY    . LEG SURGERY Right    stent placement    Medical History: Past Medical History:  Diagnosis Date  . Coronary artery disease   . Diabetes mellitus without  complication ([place])   . Heart disease   . Hypertension   . MI (myocardial infarction) ([place])   . Sleep apnea     Family History: Family History  Problem Relation Age of Onset  . Diabetes Mother   . Brain cancer Mother   . Throat cancer Father   . Prostate cancer Brother     Social History   Socioeconomic History  . Marital status: Married    Spouse name: Not on file  . Number of children: Not on file  . Years of education: Not on file  . Highest education level: Not on file  Occupational History  . Not on file  Tobacco Use  . Smoking status: Former Smoker    Packs/day: 3.00    Years: 32.00    Pack years: 96.00    Types: Cigarettes  . Smokeless tobacco: Never Used  Vaping Use  . Vaping Use: Never used  Substance and Sexual Activity  . Alcohol use: No  . Drug use: No  . Sexual activity: Not on file  Other Topics Concern  . Not on file  Social History Narrative  . Not on file   Social Determinants of Health   Financial Resource Strain: Not on file  Food Insecurity: Not on file  Transportation Needs: Not on file  Physical Activity: Not on file  Stress: Not on file  Social Connections: Not on file  Intimate Partner Violence: Not on file      Review of Systems  Constitutional: Negative for chills, fatigue and unexpected weight change.  HENT: Negative for congestion, postnasal drip, rhinorrhea, sneezing and sore throat.   Eyes: Negative for redness.  Respiratory: Negative for cough, chest tightness and shortness of breath.   Cardiovascular: Negative for chest pain and palpitations.  Gastrointestinal: Negative for abdominal pain, constipation, diarrhea, nausea and vomiting.  Genitourinary: Negative for dysuria and frequency.  Musculoskeletal: Negative for arthralgias, back pain, joint swelling and neck pain.  Skin: Negative for rash.  Neurological: Negative for tremors and numbness.  Hematological: Negative for adenopathy. Does not bruise/bleed easily.   Psychiatric/Behavioral: Negative for behavioral problems (Depression), sleep disturbance and suicidal ideas. The patient is not nervous/anxious.     Vital Signs: BP 135/77   Pulse 65   Temp 98 F (36.7 C)   Resp 16   Ht 5\' 8"  (1.727 m)   Wt 219 lb (99.3 kg)   SpO2 97%   BMI 33.30 kg/m    Physical Exam Constitutional:      Appearance: Normal appearance. He is obese.  Cardiovascular:     Rate and Rhythm: Normal rate and regular rhythm.     Pulses: Normal pulses.     Heart sounds: Normal heart sounds.  Pulmonary:     Effort: Pulmonary effort is normal.     Breath sounds: Normal breath sounds.  Musculoskeletal:        General: Normal range of motion.  Cervical back: Normal range of motion.  Skin:    General: Skin is warm.  Neurological:     General: No focal deficit present.     Mental Status: He is alert and oriented to person, place, and time. Mental status is at baseline.  Psychiatric:        Mood and Affect: Mood normal.        Behavior: Behavior normal.        Thought Content: Thought content normal.        Judgment: Judgment normal.    Assessment/Plan: 1. Type 2 diabetes mellitus with hyperglycemia, without long-term current use of insulin (HCC) Uncontrolled DM possibly cause of underlying kidney disease, will monitor Will discuss change in Metformin therapy after [place] reviewed, followed by endocrinology - US Renal; Future - VAS US RENAL ARTERY DUPLEX; Future  2. Chronic renal failure, stage 3a (HCC) Review for underlying cause of declining GFR - US Renal; Future - VAS US RENAL ARTERY DUPLEX; Future  3. Essential hypertension Remains well controlled without HCTZ, continue to hold  General Counseling: taj nevins understanding of the findings of todays visit and agrees with plan of treatment. I have discussed any further diagnostic evaluation that may be needed or ordered today. We also reviewed his medications today. he has been encouraged to call the  office with any questions or concerns that should arise related to todays visit.    Orders Placed This Encounter  Procedures  . US Renal  . VAS US RENAL ARTERY DUPLEX      Time spent: 30 Minutes Time spent includes review of chart, medications, test results and follow-up plan with the patient.  This patient was seen by Theodoro Grist AGNP-C in Collaboration with Dr Lavera Guise as a part of collaborative care agreement     Tanna Furry. Maisy [place] AGNP-C Internal medicine

## 2020-02-22 ENCOUNTER — Encounter: Payer: Self-pay | Admitting: Hospice and Palliative Medicine

## 2020-03-05 ENCOUNTER — Ambulatory Visit: Payer: Medicare Other

## 2020-03-05 ENCOUNTER — Ambulatory Visit (INDEPENDENT_AMBULATORY_CARE_PROVIDER_SITE_OTHER): Payer: Medicare Other

## 2020-03-05 DIAGNOSIS — N1831 Chronic kidney disease, stage 3a: Secondary | ICD-10-CM | POA: Diagnosis not present

## 2020-03-05 DIAGNOSIS — E1165 Type 2 diabetes mellitus with hyperglycemia: Secondary | ICD-10-CM | POA: Diagnosis not present

## 2020-03-07 ENCOUNTER — Other Ambulatory Visit: Payer: Medicare Other

## 2020-03-20 ENCOUNTER — Ambulatory Visit (INDEPENDENT_AMBULATORY_CARE_PROVIDER_SITE_OTHER): Payer: Medicare Other | Admitting: Hospice and Palliative Medicine

## 2020-03-20 ENCOUNTER — Encounter: Payer: Self-pay | Admitting: Hospice and Palliative Medicine

## 2020-03-20 VITALS — BP 147/66 | HR 71 | Temp 97.6°F | Resp 16 | Ht 68.0 in | Wt 209.0 lb

## 2020-03-20 DIAGNOSIS — I10 Essential (primary) hypertension: Secondary | ICD-10-CM | POA: Diagnosis not present

## 2020-03-20 DIAGNOSIS — I2510 Atherosclerotic heart disease of native coronary artery without angina pectoris: Secondary | ICD-10-CM

## 2020-03-20 DIAGNOSIS — E1122 Type 2 diabetes mellitus with diabetic chronic kidney disease: Secondary | ICD-10-CM | POA: Diagnosis not present

## 2020-03-20 DIAGNOSIS — Z794 Long term (current) use of insulin: Secondary | ICD-10-CM

## 2020-03-20 DIAGNOSIS — N1831 Chronic kidney disease, stage 3a: Secondary | ICD-10-CM

## 2020-03-20 MED ORDER — AMLODIPINE BESYLATE 2.5 MG PO TABS
2.5000 mg | ORAL_TABLET | Freq: Every day | ORAL | 3 refills | Status: DC
Start: 2020-03-20 — End: 2020-07-17

## 2020-03-20 NOTE — Progress Notes (Signed)
[facility], [place] [address]  Internal MEDICINE  Office Visit Note  Patient Name: Allen [name]  [id number]  [id number]  Date of Service: 03/24/2020  Chief Complaint  Patient presents with  . Diabetes    Review [place]    HPI Patient is here for routine follow-up Reviewed renal US as well as renal arterial US due to worsening renal functions and history of HTN as well as DM Normal renal US-no evidence of atrophy or impaired blood flow BP elevated today DM-followed and managed by endocrinology, at this time he is taking Metformin, Glimepiride as well as short acting insulin TID--averaging about 20 units TID per sliding scale  Not currently taking a long acting insulin Current GFR 51--steady decline over the last 2 years Rarely takes ibuprofen for occasional aches and pains--mostly takes acetaminophen HCTZ has been stopped due to declining GFR Scheduled to see endocrinologist in the next few months  Current Medication: Outpatient Encounter Medications as of 03/20/2020  Medication Sig Note  . amLODipine (NORVASC) 2.5 MG tablet Take 1 tablet (2.5 mg total) by mouth daily.   Marland Kitchen aspirin 81 MG chewable tablet Chew by mouth daily.   Marland Kitchen b complex vitamins tablet Take 1 tablet by mouth daily.   . B-D ULTRA-FINE 33 LANCETS MISC Use 1 Lancet 3 (three) times daily. 11/24/2015: Received from: [place]  . cetirizine (ZYRTEC) 10 MG tablet Take 10 mg by mouth daily.   . Cholecalciferol (VITAMIN D3) 1000 units CAPS Take by mouth.   . clotrimazole-betamethasone (LOTRISONE) cream Apply 1 application topically 2 (two) times daily.   . Continuous Blood Gluc Sensor (FREESTYLE LIBRE SENSOR SYSTEM) MISC Use 3 each every 10 (ten) days.   . enalapril (VASOTEC) 20 MG tablet Take 1 tablet (20 mg total) by mouth daily.   Marland Kitchen ezetimibe-simvastatin (VYTORIN) 10-40 MG tablet Take 1 tablet by mouth daily.   . folic acid (FOLVITE) A999333 MCG tablet Take 400 mcg by mouth  daily.   . Garlic 123XX123 MG CAPS Take by mouth.   Marland Kitchen glipiZIDE (GLUCOTROL XL) 5 MG 24 hr tablet Take 1 tablet by mouth daily.   Marland Kitchen glucose blood test strip Check blood sugar 5x a day as directed   . insulin lispro (HUMALOG) 100 UNIT/ML injection Inject 6-34 Units into the skin 3 (three) times daily before meals.    . Insulin Pen Needle 32G X 4 MM MISC USE AS DIRECTED FOUR TIMES A DAY WITH LEVEMIR FLEXTOUCH AND HUMALOG PEN.  DX CODE E11.29   . Insulin Syringe-Needle U-100 (BD INSULIN SYRINGE U/F) 31G X 5/16" 0.3 ML MISC Inject insulin [place] TID as indicated per sliding scale instuctions.   Marland Kitchen JARDIANCE 25 MG TABS tablet Take 0.5 mg by mouth daily.    Marland Kitchen levothyroxine (SYNTHROID, LEVOTHROID) 50 MCG tablet Take 50 mcg by mouth daily before breakfast.   . metFORMIN (GLUCOPHAGE) 1000 MG tablet Take 1,000 mg by mouth daily with breakfast.    . metFORMIN (GLUCOPHAGE) 500 MG tablet Take 1,000 mg by mouth daily with breakfast.   . metoprolol succinate (TOPROL-XL) 25 MG 24 hr tablet Take 1 tablet (25 mg total) by mouth daily.   . Multiple Vitamin (MULTIVITAMIN WITH MINERALS) TABS tablet Take 1 tablet by mouth daily.   . mupirocin ointment (BACTROBAN) 2 % Apply topically two times daily   . nystatin ointment (MYCOSTATIN) Apply 1 application topically 3 (three) times daily.   . pneumococcal 23 valent vaccine (PNEUMOVAX 23) 25 MCG/0.5ML  injection Inject 0.82m IM once   . traMADol (ULTRAM) 50 MG tablet Take 100 mg by mouth daily.  11/24/2015: Received from: [place] 1-2 po bid prn  . triamcinolone cream (KENALOG) 0.1 % Apply 1 application topically 2 (two) times daily.   . vitamin B-12 (CYANOCOBALAMIN) 1000 MCG tablet Take 1,000 mcg by mouth daily.   .Marland KitchenZoster Vaccine Adjuvanted ([facility] injection Shingles vaccine series - inject IM as directed .    No facility-administered encounter medications on file as of 03/20/2020.    Surgical History: Past Surgical History:  Procedure  Laterality Date  . CARDIAC CATHETERIZATION  1989  . CARDIAC CATHETERIZATION  1990  . GALLBLADDER SURGERY    . LEG SURGERY Right    stent placement    Medical History: Past Medical History:  Diagnosis Date  . Coronary artery disease   . Diabetes mellitus without complication ([place]   . Heart disease   . Hypertension   . [place]ocardial [place]) ([place]   . Sleep apnea     Family History: Family History  Problem Relation Age of Onset  . Diabetes Mother   . Brain cancer Mother   . Throat cancer Father   . Prostate cancer Brother     Social History   Socioeconomic History  . Marital status: Married    Spouse name: Not on file  . Number of children: Not on file  . Years of education: Not on file  . Highest education level: Not on file  Occupational History  . Not on file  Tobacco Use  . Smoking status: Former Smoker    Packs/day: 3.00    Years: 32.00    Pack years: 96.00    Types: Cigarettes  . Smokeless tobacco: Never Used  Vaping Use  . Vaping Use: Never used  Substance and Sexual Activity  . Alcohol use: No  . Drug use: No  . Sexual activity: Not on file  Other Topics Concern  . Not on file  Social History Narrative  . Not on file   Social Determinants of Health   Financial Resource Strain: Not on file  Food Insecurity: Not on file  Transportation Needs: Not on file  Physical Activity: Not on file  Stress: Not on file  Social Connections: Not on file  Intimate Partner Violence: Not on file      Review of Systems  Constitutional: Negative for chills, fatigue and unexpected weight change.  HENT: Negative for congestion, postnasal drip, rhinorrhea, sneezing and sore throat.   Eyes: Negative for redness.  Respiratory: Negative for cough, chest tightness and shortness of breath.   Cardiovascular: Negative for chest pain and palpitations.  Gastrointestinal: Negative for abdominal pain, constipation, diarrhea, nausea and vomiting.  Genitourinary:  Negative for dysuria and frequency.  Musculoskeletal: Negative for arthralgias, back pain, joint swelling and neck pain.  Skin: Negative for rash.  Neurological: Negative for tremors and numbness.  Hematological: Negative for adenopathy. Does not bruise/bleed easily.  Psychiatric/Behavioral: Negative for behavioral problems (Depression), sleep disturbance and suicidal ideas. The patient is not nervous/anxious.     Vital Signs: BP (!) 147/66   Pulse 71   Temp 97.6 F (36.4 C)   Resp 16   Ht '5\' 8"'$  (1.727 m)   Wt 209 lb (94.8 kg)   SpO2 98%   BMI 31.78 kg/m    Physical Exam Vitals reviewed.  Constitutional:      Appearance: Normal appearance. He is obese.  Cardiovascular:  Rate and Rhythm: Normal rate and regular rhythm.     Pulses: Normal pulses.     Heart sounds: Normal heart sounds.  Pulmonary:     Effort: Pulmonary effort is normal.     Breath sounds: Normal breath sounds.  Abdominal:     General: Abdomen is flat.     Palpations: Abdomen is soft.  Musculoskeletal:        General: Normal range of motion.     Cervical back: Normal range of motion.  Skin:    General: Skin is warm.  Neurological:     General: No focal deficit present.     Mental Status: He is alert and oriented to person, place, and time. Mental status is at baseline.    Assessment/Plan: 1. Type 2 diabetes mellitus with stage 3a chronic kidney disease, with long-term current use of insulin (HCC) Last A1C 7.2 in December At this point I recommend discontinuing Metformin due to declining GFR--would add long acting insulin to his regimen, cautiously continue glimepiride due to hypoglycemic risk in his age group Need tighter control of glucose levels due to declining kidney function   2. Essential hypertension Add low dose amlodipine to regimen for tighter BP control, may need increase in dose--will assess at next follow-up - amLODipine (NORVASC) 2.5 MG tablet; Take 1 tablet (2.5 mg total) by mouth  daily.  Dispense: 90 tablet; Refill: 3  3. Coronary artery disease involving native coronary artery of native heart without angina pectoris Continue with statin and 81 mg ASA  General Counseling: kwamaine challis understanding of the findings of todays visit and agrees with plan of treatment. I have discussed any further diagnostic evaluation that may be needed or ordered today. We also reviewed his medications today. he has been encouraged to call the office with any questions or concerns that should arise related to todays visit.   Meds ordered this encounter  Medications  . amLODipine (NORVASC) 2.5 MG tablet    Sig: Take 1 tablet (2.5 mg total) by mouth daily.    Dispense:  90 tablet    Refill:  3    Time spent: 30 Minutes Time spent includes review of chart, medications, test results and follow-up plan with the patient.  This patient was seen by Theodoro Grist AGNP-C in Collaboration with Dr Lavera Guise as a part of collaborative care agreement     Tanna Furry. Velva Molinari AGNP-C Internal medicine

## 2020-03-24 ENCOUNTER — Encounter: Payer: Self-pay | Admitting: Hospice and Palliative Medicine

## 2020-04-03 ENCOUNTER — Ambulatory Visit: Payer: Medicare Other | Admitting: Hospice and Palliative Medicine

## 2020-04-15 ENCOUNTER — Other Ambulatory Visit: Payer: Self-pay | Admitting: Nurse Practitioner

## 2020-04-17 ENCOUNTER — Other Ambulatory Visit: Payer: Self-pay | Admitting: Hospice and Palliative Medicine

## 2020-04-17 ENCOUNTER — Ambulatory Visit: Payer: Medicare Other | Admitting: Hospice and Palliative Medicine

## 2020-04-21 ENCOUNTER — Other Ambulatory Visit: Payer: Self-pay | Admitting: Hospice and Palliative Medicine

## 2020-04-21 MED ORDER — METOPROLOL SUCCINATE ER 25 MG PO TB24: 25.0000 mg | ORAL_TABLET | Freq: Every day | ORAL | 1 refills | Status: DC

## 2020-05-01 ENCOUNTER — Encounter: Payer: Self-pay | Admitting: Hospice and Palliative Medicine

## 2020-05-01 ENCOUNTER — Other Ambulatory Visit: Payer: Self-pay

## 2020-05-01 ENCOUNTER — Ambulatory Visit (INDEPENDENT_AMBULATORY_CARE_PROVIDER_SITE_OTHER): Payer: Medicare Other | Admitting: Hospice and Palliative Medicine

## 2020-05-01 VITALS — BP 120/58 | HR 60 | Temp 97.1°F | Resp 16 | Ht 67.0 in | Wt 209.2 lb

## 2020-05-01 DIAGNOSIS — I2510 Atherosclerotic heart disease of native coronary artery without angina pectoris: Secondary | ICD-10-CM | POA: Diagnosis not present

## 2020-05-01 DIAGNOSIS — E1122 Type 2 diabetes mellitus with diabetic chronic kidney disease: Secondary | ICD-10-CM

## 2020-05-01 DIAGNOSIS — I10 Essential (primary) hypertension: Secondary | ICD-10-CM

## 2020-05-01 DIAGNOSIS — E1165 Type 2 diabetes mellitus with hyperglycemia: Secondary | ICD-10-CM

## 2020-05-01 DIAGNOSIS — N1831 Chronic kidney disease, stage 3a: Secondary | ICD-10-CM | POA: Diagnosis not present

## 2020-05-01 DIAGNOSIS — G4733 Obstructive sleep apnea (adult) (pediatric): Secondary | ICD-10-CM | POA: Diagnosis not present

## 2020-05-01 DIAGNOSIS — Z794 Long term (current) use of insulin: Secondary | ICD-10-CM

## 2020-05-01 LAB — POCT GLYCOSYLATED HEMOGLOBIN (HGB A1C): Hemoglobin A1C: 7 % — AB (ref 4.0–5.6)

## 2020-05-01 NOTE — Progress Notes (Signed)
[facility] [place], [place] [address]  Internal MEDICINE  Office Visit Note  Patient Name: Allen [name]  [id number]  [id number]  Date of Service: 05/02/2020  Chief Complaint  Patient presents with  . Follow-up  . Diabetes  . Sleep Apnea  . Hypertension  . Quality Metric Gaps    Eye exam scheduled    HPI Patient is here for routine follow-up Has follow-up appointment with endocrinology at the end of this month--encouraged to discuss Metformin alternatives due to declining kidney function Renal and vascular US normal Reviewed home readings of BP, HR and glucose--all stable BP remains well controlled since discontinuing HCTZ due to declining renal function   Current Medication: Outpatient Encounter Medications as of 05/01/2020  Medication Sig Note  . amLODipine (NORVASC) 2.5 MG tablet Take 1 tablet (2.5 mg total) by mouth daily.   Marland Kitchen aspirin 81 MG chewable tablet Chew by mouth daily.   Marland Kitchen b complex vitamins tablet Take 1 tablet by mouth daily.   . B-D ULTRA-FINE 33 LANCETS MISC Use 1 Lancet 3 (three) times daily. 11/24/2015: Received from: [place]  . cetirizine (ZYRTEC) 10 MG tablet Take 10 mg by mouth daily.   . Cholecalciferol (VITAMIN D3) 1000 units CAPS Take by mouth.   . clotrimazole-betamethasone (LOTRISONE) cream Apply 1 application topically 2 (two) times daily.   . Continuous Blood Gluc Sensor (FREESTYLE LIBRE SENSOR SYSTEM) MISC Use 3 each every 10 (ten) days.   . enalapril (VASOTEC) 20 MG tablet TAKE 1 TABLET(20 MG) BY MOUTH DAILY   . ezetimibe-simvastatin (VYTORIN) 10-40 MG tablet TAKE 1 TABLET BY MOUTH DAILY   . folic acid (FOLVITE) A999333 MCG tablet Take 400 mcg by mouth daily.   . Garlic 123XX123 MG CAPS Take by mouth.   Marland Kitchen glipiZIDE (GLUCOTROL XL) 5 MG 24 hr tablet Take 1 tablet by mouth daily.   Marland Kitchen glucose blood test strip Check blood sugar 5x a day as directed   . insulin lispro (HUMALOG) 100 UNIT/ML injection Inject 6-34  Units into the skin 3 (three) times daily before meals.    . Insulin Pen Needle 32G X 4 MM MISC USE AS DIRECTED FOUR TIMES A DAY WITH LEVEMIR FLEXTOUCH AND HUMALOG PEN.  DX CODE E11.29   . Insulin Syringe-Needle U-100 (BD INSULIN SYRINGE U/F) 31G X 5/16" 0.3 ML MISC Inject insulin [place] TID as indicated per sliding scale instuctions.   Marland Kitchen JARDIANCE 25 MG TABS tablet Take 0.5 mg by mouth daily.    Marland Kitchen levothyroxine (SYNTHROID, LEVOTHROID) 50 MCG tablet Take 50 mcg by mouth daily before breakfast.   . metFORMIN (GLUCOPHAGE) 1000 MG tablet Take 1,000 mg by mouth daily with breakfast.    . metFORMIN (GLUCOPHAGE) 500 MG tablet Take 1,000 mg by mouth daily with breakfast.   . metoprolol succinate (TOPROL-XL) 25 MG 24 hr tablet Take 1 tablet (25 mg total) by mouth daily.   . Multiple Vitamin (MULTIVITAMIN WITH MINERALS) TABS tablet Take 1 tablet by mouth daily.   . mupirocin ointment (BACTROBAN) 2 % Apply topically two times daily   . nystatin ointment (MYCOSTATIN) Apply 1 application topically 3 (three) times daily.   . pneumococcal 23 valent vaccine (PNEUMOVAX 23) 25 MCG/0.5ML injection Inject 0.7m IM once   . traMADol (ULTRAM) 50 MG tablet Take 100 mg by mouth daily.  11/24/2015: Received from: [place] 1-2 po bid prn  . triamcinolone cream (KENALOG) 0.1 % Apply 1 application topically 2 (  two) times daily.   . vitamin B-12 (CYANOCOBALAMIN) 1000 MCG tablet Take 1,000 mcg by mouth daily.   Marland Kitchen Zoster Vaccine Adjuvanted [facility]) injection Shingles vaccine series - inject IM as directed .    No facility-administered encounter medications on file as of 05/01/2020.    Surgical History: Past Surgical History:  Procedure Laterality Date  . CARDIAC CATHETERIZATION  1989  . CARDIAC CATHETERIZATION  1990  . GALLBLADDER SURGERY    . LEG SURGERY Right    stent placement    Medical History: Past Medical History:  Diagnosis Date  . Coronary artery disease   . Diabetes  mellitus without complication ([place])   . Heart disease   . Hypertension   . MI (myocardial infarction) ([place])   . Sleep apnea     Family History: Family History  Problem Relation Age of Onset  . Diabetes Mother   . Brain cancer Mother   . Throat cancer Father   . Prostate cancer Brother     Social History   Socioeconomic History  . Marital status: Married    Spouse name: Not on file  . Number of children: Not on file  . Years of education: Not on file  . Highest education level: Not on file  Occupational History  . Not on file  Tobacco Use  . Smoking status: Former Smoker    Packs/day: 3.00    Years: 32.00    Pack years: 96.00    Types: Cigarettes  . Smokeless tobacco: Never Used  Vaping Use  . Vaping Use: Never used  Substance and Sexual Activity  . Alcohol use: No  . Drug use: No  . Sexual activity: Not on file  Other Topics Concern  . Not on file  Social History Narrative  . Not on file   Social Determinants of Health   Financial Resource Strain: Not on file  Food Insecurity: Not on file  Transportation Needs: Not on file  Physical Activity: Not on file  Stress: Not on file  Social Connections: Not on file  Intimate Partner Violence: Not on file      Review of Systems  Constitutional: Negative for chills, fatigue and unexpected weight change.  HENT: Negative for congestion, postnasal drip, rhinorrhea, sneezing and sore throat.   Eyes: Negative for redness.  Respiratory: Negative for cough, chest tightness and shortness of breath.   Cardiovascular: Negative for chest pain and palpitations.  Gastrointestinal: Negative for abdominal pain, constipation, diarrhea, nausea and vomiting.  Genitourinary: Negative for dysuria and frequency.  Musculoskeletal: Negative for arthralgias, back pain, joint swelling and neck pain.  Skin: Negative for rash.  Neurological: Negative for tremors and numbness.  Hematological: Negative for adenopathy. Does not  bruise/bleed easily.  Psychiatric/Behavioral: Negative for behavioral problems (Depression), sleep disturbance and suicidal ideas. The patient is not nervous/anxious.     Vital Signs: BP (!) 120/58   Pulse 60   Temp (!) 97.1 F (36.2 C)   Resp 16   Ht '5\' 7"'$  (1.702 m)   Wt 209 lb 3.2 oz (94.9 kg)   SpO2 99%   BMI 32.77 kg/m    Physical Exam Vitals reviewed.  Constitutional:      Appearance: Normal appearance. He is normal weight.  Cardiovascular:     Rate and Rhythm: Normal rate and regular rhythm.     Pulses: Normal pulses.     Heart sounds: Normal heart sounds.  Pulmonary:     Effort: Pulmonary effort is normal.  Breath sounds: Normal breath sounds.  Abdominal:     General: Abdomen is flat.     Palpations: Abdomen is soft.  Musculoskeletal:        General: Normal range of motion.     Cervical back: Normal range of motion.  Skin:    General: Skin is warm.  Neurological:     General: No focal deficit present.     Mental Status: He is alert and oriented to person, place, and time. Mental status is at baseline.  Psychiatric:        Mood and Affect: Mood normal.        Behavior: Behavior normal.        Thought Content: Thought content normal.        Judgment: Judgment normal.    Assessment/Plan: 1. Type 2 diabetes mellitus with stage 3a chronic kidney disease, with long-term current use of insulin (HCC) A1C 7.0--advised to discuss alternative options for Metformin due to declining kidney function - POCT HgB A1C  2. Essential hypertension BP and HR well controlled, continue to monitor  3. Coronary artery disease involving native coronary artery of native heart without angina pectoris Continue with V[place], tolerating well, followed by cardiology  4. Obstructive sleep apnea Continue with nightly compliance with CPAP  General Counseling: Allen [name] understanding of the findings of todays visit and agrees with plan of treatment. I have discussed any  further diagnostic evaluation that may be needed or ordered today. We also reviewed his medications today. he has been encouraged to call the office with any questions or concerns that should arise related to todays visit.    Orders Placed This Encounter  Procedures  . POCT HgB A1C      Time spent: 30 Minutes Time spent includes review of chart, medications, test results and follow-up plan with the patient.  This patient was seen by Theodoro Grist AGNP-C in Collaboration with Dr Lavera Guise as a part of collaborative care agreement     Tanna Furry. Thor Nannini AGNP-C Internal medicine

## 2020-05-02 ENCOUNTER — Encounter: Payer: Self-pay | Admitting: Hospice and Palliative Medicine

## 2020-05-15 DIAGNOSIS — Z794 Long term (current) use of insulin: Secondary | ICD-10-CM | POA: Diagnosis not present

## 2020-05-15 DIAGNOSIS — E1169 Type 2 diabetes mellitus with other specified complication: Secondary | ICD-10-CM | POA: Diagnosis not present

## 2020-05-15 DIAGNOSIS — E669 Obesity, unspecified: Secondary | ICD-10-CM | POA: Diagnosis not present

## 2020-05-15 DIAGNOSIS — N186 End stage renal disease: Secondary | ICD-10-CM | POA: Diagnosis not present

## 2020-05-15 DIAGNOSIS — E1122 Type 2 diabetes mellitus with diabetic chronic kidney disease: Secondary | ICD-10-CM | POA: Diagnosis not present

## 2020-05-15 DIAGNOSIS — E785 Hyperlipidemia, unspecified: Secondary | ICD-10-CM | POA: Diagnosis not present

## 2020-05-15 DIAGNOSIS — I152 Hypertension secondary to endocrine disorders: Secondary | ICD-10-CM | POA: Diagnosis not present

## 2020-05-15 DIAGNOSIS — E1159 Type 2 diabetes mellitus with other circulatory complications: Secondary | ICD-10-CM | POA: Diagnosis not present

## 2020-05-15 DIAGNOSIS — E039 Hypothyroidism, unspecified: Secondary | ICD-10-CM | POA: Diagnosis not present

## 2020-05-15 DIAGNOSIS — Z992 Dependence on renal dialysis: Secondary | ICD-10-CM | POA: Diagnosis not present

## 2020-05-15 DIAGNOSIS — N181 Chronic kidney disease, stage 1: Secondary | ICD-10-CM | POA: Diagnosis not present

## 2020-06-26 DIAGNOSIS — H26492 Other secondary cataract, left eye: Secondary | ICD-10-CM | POA: Diagnosis not present

## 2020-06-26 DIAGNOSIS — E113212 Type 2 diabetes mellitus with mild nonproliferative diabetic retinopathy with macular edema, left eye: Secondary | ICD-10-CM | POA: Diagnosis not present

## 2020-07-15 ENCOUNTER — Other Ambulatory Visit: Payer: Self-pay

## 2020-07-15 ENCOUNTER — Emergency Department: Payer: Medicare Other

## 2020-07-15 ENCOUNTER — Emergency Department
Admission: EM | Admit: 2020-07-15 | Discharge: 2020-07-15 | Disposition: A | Discharge status: Home / Self Care | Payer: Medicare Other | Attending: Emergency Medicine | Admitting: Emergency Medicine

## 2020-07-15 DIAGNOSIS — Z87891 Personal history of nicotine dependence: Secondary | ICD-10-CM | POA: Insufficient documentation

## 2020-07-15 DIAGNOSIS — Z7982 Long term (current) use of aspirin: Secondary | ICD-10-CM | POA: Insufficient documentation

## 2020-07-15 DIAGNOSIS — R531 Weakness: Secondary | ICD-10-CM | POA: Diagnosis not present

## 2020-07-15 DIAGNOSIS — Z79899 Other long term (current) drug therapy: Secondary | ICD-10-CM | POA: Diagnosis not present

## 2020-07-15 DIAGNOSIS — I10 Essential (primary) hypertension: Secondary | ICD-10-CM | POA: Insufficient documentation

## 2020-07-15 DIAGNOSIS — R001 Bradycardia, unspecified: Secondary | ICD-10-CM | POA: Diagnosis not present

## 2020-07-15 DIAGNOSIS — I2510 Atherosclerotic heart disease of native coronary artery without angina pectoris: Secondary | ICD-10-CM | POA: Diagnosis not present

## 2020-07-15 DIAGNOSIS — Z794 Long term (current) use of insulin: Secondary | ICD-10-CM | POA: Insufficient documentation

## 2020-07-15 DIAGNOSIS — M5416 Radiculopathy, lumbar region: Secondary | ICD-10-CM | POA: Diagnosis not present

## 2020-07-15 DIAGNOSIS — E119 Type 2 diabetes mellitus without complications: Secondary | ICD-10-CM | POA: Diagnosis not present

## 2020-07-15 DIAGNOSIS — Z7984 Long term (current) use of oral hypoglycemic drugs: Secondary | ICD-10-CM | POA: Insufficient documentation

## 2020-07-15 LAB — CBC
HCT: 41.4 % (ref 39.0–52.0)
Hemoglobin: 13.2 g/dL (ref 13.0–17.0)
MCH: 31.3 pg (ref 26.0–34.0)
MCHC: 31.9 g/dL (ref 30.0–36.0)
MCV: 98.1 fL (ref 80.0–100.0)
Platelets: 250 10*3/uL (ref 150–400)
RBC: 4.22 MIL/uL (ref 4.22–5.81)
RDW: 14.2 % (ref 11.5–15.5)
WBC: 5.9 10*3/uL (ref 4.0–10.5)
nRBC: 0 % (ref 0.0–0.2)

## 2020-07-15 LAB — BASIC METABOLIC PANEL
Anion gap: 11 (ref 5–15)
BUN: 26 mg/dL — ABNORMAL HIGH (ref 8–23)
CO2: 23 mmol/L (ref 22–32)
Calcium: 9.4 mg/dL (ref 8.9–10.3)
Chloride: 101 mmol/L (ref 98–111)
Creatinine, Ser: 1.18 mg/dL (ref 0.61–1.24)
GFR, Estimated: >60 mL/min (ref >60)
Glucose, Bld: 198 mg/dL — ABNORMAL HIGH (ref 70–99)
Potassium: 4.6 mmol/L (ref 3.5–5.1)
Sodium: 135 mmol/L (ref 135–145)

## 2020-07-15 LAB — TSH: TSH: 3.393 u[IU]/mL (ref 0.350–4.500)

## 2020-07-15 LAB — T4, FREE: Free T4: 0.87 ng/dL (ref 0.61–1.12)

## 2020-07-15 LAB — MAGNESIUM: Magnesium: 2.5 mg/dL — ABNORMAL HIGH (ref 1.7–2.4)

## 2020-07-15 LAB — TROPONIN I (HIGH SENSITIVITY): Troponin I (High Sensitivity): 6 ng/L (ref <18)

## 2020-07-15 NOTE — ED Notes (Signed)
Pt ambulatory to restroom with cane.

## 2020-07-15 NOTE — ED Notes (Signed)
See triage note, pt to ED From [facility] for bradycardia HR in 30s. Pt currently SB with HR in 94s.  Denies cp, shob or dizziness. Alert and oriented. NAD noted

## 2020-07-15 NOTE — ED Provider Notes (Signed)
[facility] Emergency Department Provider Note ____________________________________________   Event Date/Time   First MD Initiated Contact with Patient 07/15/20 1510     (approximate)  I have reviewed the triage vital signs and the nursing notes.   HISTORY  Chief Complaint Bradycardia  HPI Allen [name] is a 80 y.o. male with history of diabetes, CAD, MI, DM presents to the emergency department from [facility] for treatment and evaluation of bradycardia. He is asymptomatic. He denies palpitations. His wife says that he complained of feeling weak yesterday, but patient states that he just felt bad in general. He states he would not have known his heart rate was low if he hadn't been at the doctor's office. Similar symptoms in the past and resolved after his Metoprolol was adjusted.      Past Medical History:  Diagnosis Date  . Coronary artery disease   . Diabetes mellitus without complication ([place])   . Heart disease   . Hypertension   . MI (myocardial infarction) ([place])   . Sleep apnea     Patient Active Problem List   Diagnosis Date Noted  . Need for shingles vaccine 01/22/2019  . Need for vaccination against Streptococcus pneumoniae using pneumococcal conjugate vaccine 7 01/22/2019  . Encounter for general adult medical examination with abnormal findings 01/18/2018  . Abnormal liver function 01/18/2018  . S/P laparoscopic cholecystectomy 01/18/2018  . Bradycardia 01/18/2018  . Cutaneous candidiasis 01/18/2018  . Oral candidiasis 01/18/2018  . Septic shock ([place]) 01/08/2018  . Insect bite of right lower leg 09/25/2017  . Atopic dermatitis 09/25/2017  . PAD (peripheral artery disease) ([place]) 11/24/2015  . Type 2 diabetes mellitus with hyperglycemia, without long-term current use of insulin ([place]) 11/24/2015  . Hyperlipidemia 11/24/2015  . Essential hypertension 11/24/2015  . PVD (peripheral vascular disease) ([place]) 10/29/2015    Past Surgical  History:  Procedure Laterality Date  . CARDIAC CATHETERIZATION  1989  . CARDIAC CATHETERIZATION  1990  . GALLBLADDER SURGERY    . LEG SURGERY Right    stent placement    Prior to Admission medications   Medication Sig Start Date End Date Taking? Authorizing Provider  amLODipine (NORVASC) 2.5 MG tablet Take 1 tablet (2.5 mg total) by mouth daily. 03/20/20   Luiz Ochoa, NP  aspirin 81 MG chewable tablet Chew by mouth daily.    [provider]  b complex vitamins tablet Take 1 tablet by mouth daily.    [provider]  B-D ULTRA-FINE 33 LANCETS MISC Use 1 Lancet 3 (three) times daily. 05/21/15   [provider]  cetirizine (ZYRTEC) 10 MG tablet Take 10 mg by mouth daily.    [provider]  Cholecalciferol (VITAMIN D3) 1000 units CAPS Take by mouth.    [provider]  clotrimazole-betamethasone (LOTRISONE) cream Apply 1 application topically 2 (two) times daily. 12/26/18   Ronnell Freshwater, NP  Continuous Blood Gluc Sensor ([place]) MISC Use 3 each every 10 (ten) days. 06/17/16   [provider]  enalapril (VASOTEC) 20 MG tablet TAKE 1 TABLET(20 MG) BY MOUTH DAILY 04/17/20   Luiz Ochoa, NP  ezetimibe-simvastatin (VYTORIN) 10-40 MG tablet TAKE 1 TABLET BY MOUTH DAILY 04/17/20   Luiz Ochoa, NP  folic acid (FOLVITE) A999333 MCG tablet Take 400 mcg by mouth daily.    [provider]  Garlic 123XX123 MG CAPS Take by mouth.    [provider]  glipiZIDE (GLUCOTROL XL) 5 MG 24  hr tablet Take 1 tablet by mouth daily. 07/19/19 07/18/20  [provider]  glucose blood test strip Check blood sugar 5x a day as directed 07/30/15   [provider]  insulin lispro (HUMALOG) 100 UNIT/ML injection Inject 6-34 Units into the skin 3 (three) times daily before meals.     [provider]  Insulin Pen Needle 32G X 4 MM MISC USE AS DIRECTED FOUR TIMES A DAY WITH LEVEMIR FLEXTOUCH AND HUMALOG PEN.  DX  CODE E11.29 07/26/16   [provider]  Insulin Syringe-Needle U-100 (BD INSULIN SYRINGE U/F) 31G X 5/16" 0.3 ML MISC Inject insulin [place] TID as indicated per sliding scale instuctions. 07/14/17   Ronnell Freshwater, NP  JARDIANCE 25 MG TABS tablet Take 0.5 mg by mouth daily.  07/07/17   [provider]  levothyroxine (SYNTHROID, LEVOTHROID) 50 MCG tablet Take 50 mcg by mouth daily before breakfast.    [provider]  metFORMIN (GLUCOPHAGE) 1000 MG tablet Take 1,000 mg by mouth daily with breakfast.     [provider]  metFORMIN (GLUCOPHAGE) 500 MG tablet Take 1,000 mg by mouth daily with breakfast.    [provider]  metoprolol succinate (TOPROL-XL) 25 MG 24 hr tablet Take 1 tablet (25 mg total) by mouth daily. 04/21/20 10/18/20  Luiz Ochoa, NP  Multiple Vitamin (MULTIVITAMIN WITH MINERALS) TABS tablet Take 1 tablet by mouth daily.    [provider]  mupirocin ointment (BACTROBAN) 2 % Apply topically two times daily 09/06/17   Ronnell Freshwater, NP  nystatin ointment (MYCOSTATIN) Apply 1 application topically 3 (three) times daily. 01/16/18   Ronnell Freshwater, NP  pneumococcal 23 valent vaccine (PNEUMOVAX 23) 25 MCG/0.5ML injection Inject 0.74m IM once 01/22/19   BRonnell Freshwater NP  traMADol (ULTRAM) 50 MG tablet Take 100 mg by mouth daily.  11/17/15   [provider]  triamcinolone cream (KENALOG) 0.1 % Apply 1 application topically 2 (two) times daily. 10/17/18   BRonnell Freshwater NP  vitamin B-12 (CYANOCOBALAMIN) 1000 MCG tablet Take 1,000 mcg by mouth daily.    [provider]  Zoster Vaccine Adjuvanted ([facility] injection Shingles vaccine series - inject IM as directed . 01/22/19   BRonnell Freshwater NP    Allergies Iodinated diagnostic agents, Iohexol, Duloxetine, Dye fdc red [red dye], Fluticasone, Gabapentin, Penicillins, and Other  Family History  Problem Relation Age of Onset  . Diabetes Mother   . Brain cancer  Mother   . Throat cancer Father   . Prostate cancer Brother     Social History Social History   Tobacco Use  . Smoking status: Former Smoker    Packs/day: 3.00    Years: 32.00    Pack years: 96.00    Types: Cigarettes  . Smokeless tobacco: Never Used  Vaping Use  . Vaping Use: Never used  Substance Use Topics  . Alcohol use: No  . Drug use: No    Review of Systems  Constitutional: No fever/chills Eyes: No visual changes. ENT: No sore throat. Cardiovascular: Denies chest pain. Respiratory: Denies shortness of breath. Gastrointestinal: No abdominal pain.  No nausea, no vomiting. Genitourinary: Negative for dysuria. Musculoskeletal: Negative for back pain. Skin: Negative for rash. Neurological: Negative for headaches, focal weakness or numbness. ____________________________________________   PHYSICAL EXAM:  VITAL SIGNS: ED Triage Vitals  Enc Vitals Group     BP 07/15/20 1422 (!) 147/67     Pulse Rate 07/15/20 1422 (!) 57  Resp 07/15/20 1422 17     Temp 07/15/20 1422 98 F (36.7 C)     Temp Source 07/15/20 1422 Oral     SpO2 07/15/20 1422 95 %     Weight 07/15/20 1423 210 lb (95.3 kg)     Height 07/15/20 1423 '5\' 7"'$  (1.702 m)     Head Circumference --      Peak Flow --      Pain Score 07/15/20 1423 0     Pain Loc --      Pain Edu? --      Excl. in [place]? --     Constitutional: Alert and oriented. Well appearing and in no acute distress. Eyes: Conjunctivae are normal. Head: Atraumatic. Nose: No congestion/rhinnorhea. Mouth/Throat: Mucous membranes are moist.  Oropharynx non-erythematous. Neck: No stridor.   Hematological/Lymphatic/Immunilogical: No cervical lymphadenopathy. Cardiovascular:Bradycardia; irregular rate. Good peripheral circulation. PVCs are perfused. Respiratory: Normal respiratory effort.  No retractions. Lungs CTAB. Gastrointestinal: Soft and nontender. No distention. No abdominal bruits. No CVA tenderness. Genitourinary:   Musculoskeletal: No lower extremity tenderness nor edema.  No joint effusions. Neurologic:  Normal speech and language. No gross focal neurologic deficits are appreciated. No gait instability. Skin:  Skin is warm, dry and intact. No rash noted. Psychiatric: Mood and affect are normal. Speech and behavior are normal.  ____________________________________________   LABS (all labs ordered are listed, but only abnormal results are displayed)  Labs Reviewed  BASIC METABOLIC PANEL - Abnormal; Notable for the following components:      Result Value   Glucose, Bld 198 (*)    BUN 26 (*)    All other components within normal limits  MAGNESIUM - Abnormal; Notable for the following components:   Magnesium 2.5 (*)    All other components within normal limits  CBC  TSH  T4, FREE  TROPONIN I (HIGH SENSITIVITY)   ____________________________________________  EKG  ED ECG REPORT I, Ishaq Maffei, FNP-BC personally viewed and interpreted this ECG.   Date: 07/15/2020  EKG Time: 1420  Rate: 59  Rhythm: sinus bradycardia with frequent PVCs  Axis: rightward  Intervals:first-degree A-V block   ST&T Change: no ST elevation  ____________________________________________  RADIOLOGY  ED MD interpretation:    No acute cardiopulmonary abnormality  I, Kimbely Whiteaker, personally viewed and evaluated these images (plain radiographs) as part of my medical decision making, as well as reviewing the written report by the radiologist.  Official radiology report(s): DG Chest 2 View  Result Date: 07/15/2020 CLINICAL DATA:  Bradycardia EXAM: CHEST - 2 VIEW COMPARISON:  01/08/2018 FINDINGS: Cardiac shadow is within normal limits. Aortic calcifications are again noted. Lungs are well aerated bilaterally. No focal infiltrate or sizable effusion is seen. No acute bony abnormality is noted. IMPRESSION: No acute abnormality noted. Aortic Atherosclerosis (ICD10-I70.0). Electronically Signed   By: Inez Catalina  M.D.   On: 07/15/2020 15:14    ____________________________________________   PROCEDURES  Procedure(s) performed (including Critical Care):  Procedures  ____________________________________________   INITIAL IMPRESSION / ASSESSMENT AND PLAN     80 year old male presenting to the emergency department for evaluation of bradycardia.  See HPI for further details.  Patient is asymptomatic.  Will do some screening labs.  Patient was encouraged to report any palpitations, chest pain, shortness of breath, or concerns.  DIFFERENTIAL DIAGNOSIS  Cardiac event; Beta blocker  ED COURSE   Clinical Course as of 07/15/20 1922  Tue Jul 15, 2020  1637 Discussed with Dr. Saunders Revel of cardiology. He does not recommend  any changes in medication and will contact the scheduler for the clinic to get him scheduled for follow up. Patient and wife aware and agreeable with the plan. [CT]    Clinical Course User Index [CT] Tomislav Micale B, FNP   No chest pain, shortness of breath or symptomatic bradycardia while here. Discharged home with strict ER precautions. _________________________________________   FINAL CLINICAL IMPRESSION(S) / ED DIAGNOSES  Final diagnoses:  Bradycardia     ED Discharge Orders    None       Allen [name] was evaluated in Emergency Department on 07/15/2020 for the symptoms described in the history of present illness. He was evaluated in the context of the global COVID-19 pandemic, which necessitated consideration that the patient might be at risk for infection with the SARS-CoV-2 virus that causes COVID-19. Institutional protocols and algorithms that pertain to the evaluation of patients at risk for COVID-19 are in a state of rapid change based on information released by regulatory bodies including the CDC and federal and state organizations. These policies and algorithms were followed during the patient's care in the ED.   Note:  This document was prepared using Dragon voice  recognition software and may include unintentional dictation errors.   Victorino Dike, FNP 07/15/20 [place]    Vanessa Hana, MD 07/15/20 2030

## 2020-07-15 NOTE — ED Triage Notes (Signed)
Pt states he was at his PCP for a 30mocheck up and was sent here for HR in the 30's. Pt denies any chest pain or SOB. Pt is NAD on arrival

## 2020-07-15 NOTE — Discharge Instructions (Signed)
Please call to schedule follow-up appointment with cardiology if you do not hear from them by tomorrow afternoon.  Return to the emergency department immediately if you develop any chest pain, weakness, palpitations, or any other symptom of concern.

## 2020-07-17 ENCOUNTER — Ambulatory Visit (INDEPENDENT_AMBULATORY_CARE_PROVIDER_SITE_OTHER): Payer: Medicare Other | Admitting: Nurse Practitioner

## 2020-07-17 ENCOUNTER — Ambulatory Visit (INDEPENDENT_AMBULATORY_CARE_PROVIDER_SITE_OTHER): Payer: Medicare Other

## 2020-07-17 ENCOUNTER — Encounter: Payer: Self-pay | Admitting: Nurse Practitioner

## 2020-07-17 ENCOUNTER — Other Ambulatory Visit: Payer: Self-pay

## 2020-07-17 VITALS — BP 142/60 | HR 61 | Ht 68.0 in | Wt 211.4 lb

## 2020-07-17 DIAGNOSIS — I498 Other specified cardiac arrhythmias: Secondary | ICD-10-CM

## 2020-07-17 DIAGNOSIS — E785 Hyperlipidemia, unspecified: Secondary | ICD-10-CM | POA: Diagnosis not present

## 2020-07-17 DIAGNOSIS — I493 Ventricular premature depolarization: Secondary | ICD-10-CM

## 2020-07-17 DIAGNOSIS — I2510 Atherosclerotic heart disease of native coronary artery without angina pectoris: Secondary | ICD-10-CM

## 2020-07-17 DIAGNOSIS — R002 Palpitations: Secondary | ICD-10-CM | POA: Diagnosis not present

## 2020-07-17 DIAGNOSIS — R001 Bradycardia, unspecified: Secondary | ICD-10-CM

## 2020-07-17 DIAGNOSIS — I10 Essential (primary) hypertension: Secondary | ICD-10-CM | POA: Diagnosis not present

## 2020-07-17 DIAGNOSIS — I739 Peripheral vascular disease, unspecified: Secondary | ICD-10-CM | POA: Diagnosis not present

## 2020-07-17 MED ORDER — VALSARTAN 320 MG PO TABS: 320.0000 mg | ORAL_TABLET | Freq: Every day | ORAL | 3 refills | Status: DC

## 2020-07-17 NOTE — Progress Notes (Signed)
Office Visit    Patient Name: Allen [name] Date of Encounter: 07/17/2020  Primary Care Provider:  System, Provider Not In Primary Cardiologist:  [name], MD  Chief Complaint    80 year old male with a history of CAD status post MI in 1989, hypertension, hyperlipidemia, diabetes, obesity, peripheral arterial disease, and sleep apnea, presents for follow-up of PVCs and bradycardia.  Past Medical History    Past Medical History:  Diagnosis Date  . Coronary artery disease    a. 1989 s/p MI w/ PTCA to unknown vessel. Reports cath 6 months later with occluded vessel and collaterals--> medically managed since..  . Diabetes mellitus without complication ([place])   . Hyperlipidemia LDL goal <70   . Hypertension    a. 02/2020 nl renal artery duplex.  . MI (myocardial infarction) ([place])   . PAD (peripheral artery disease) ([place])    a. 12/2019 ABIs: R 1.02, L 0.56-->unchanged.  . Sleep apnea    Past Surgical History:  Procedure Laterality Date  . CARDIAC CATHETERIZATION  1989  . CARDIAC CATHETERIZATION  1990  . GALLBLADDER SURGERY    . LEG SURGERY Right    stent placement    Allergies  Allergies  Allergen Reactions  . Iodinated Diagnostic Agents Anaphylaxis and Rash    TOLERATED CT CONTRAST ON 01/07/2018 WITH NO ISSUES AFTER 10 MINUTE PRETREATMENT X-Ray contrast dyes- anaphylaxis & rash X-Ray contrast dyes- anaphylaxis & rash  X-Ray contrast dyes- anaphylaxis & rash TOLERATED CT CONTRAST ON 01/07/2018 WITH NO ISSUES AFTER 10 MINUTE PRETREATMENT X-Ray contrast dyes- anaphylaxis & rash X-Ray contrast dyes- anaphylaxis & rash  . Iohexol Hives and Shortness Of Breath    Pt states that he had IV dye for a kidney x-ray years ago and after injection of the dye he had hives all over his body, throat swelling, and SOB. Pt states that he had IV dye for a kidney x-ray years ago and after injection of the dye he had hives all over his body, throat swelling, and SOB. Pt states that he  had IV dye for a kidney x-ray years ago and after injection of the dye he had hives all over his body, throat swelling, and SOB.  . Duloxetine Other (See Comments)    Sick to stomach and chills  . Dye Fdc Red [Red Dye] Hives    Pt says that he is not allergic anymore.  . Fluticasone Other (See Comments)    Caused nosebleeds  . Gabapentin Other (See Comments)  . Penicillins Other (See Comments)  . Other Rash    History of Present Illness    80 year old male with above past medical history including CAD status post MI in 1989.  He was treated with angioplasty at [facility] but then had repeat catheterization approximately 6 months later, showing occluded vessel with collaterals.  He has been medically managed since and has not had any ischemic cardiac events.  Other history includes diabetes, hypertension, hyperlipidemia, obesity, peripheral arterial disease, and sleep apnea on CPAP.  During hospitalization for sepsis and cholecystitis requiring cholecystectomy in late 2019, he was noted to have nocturnal bradycardia with a rate as low as 39.  His metoprolol dose was reduced during hospitalization.  Bradycardia was felt to be most likely secondary to sleep apnea.  He was last seen in cardiology clinic in March 2021, at which time he was feeling well.  [name] was seen in the emergency department on May 31 secondary to bradycardia.  The day prior, he had  been feeling somewhat weak with generalized malaise, and also noted that his heart rate was slow and rhythm somewhat irregular.  This prompted a primary care office visit on the 31st, where his heart rate was recorded at 30 bpm.  He was otherwise asymptomatic.  He was referred to the emergency department where ECG showed sinus rhythm with first-degree AV block and frequent PVCs, and a heart rate of 59 bpm.  Lab work was unremarkable and after discussion with cardiology, discharge was advised with outpatient follow-up and continuation of current medications.   Since his ED visit he has been asymptomatic.  He still notes some irregularity to his pulse when he checks it.  He is quite sedentary in the setting of chronic back pain and peripheral neuropathy impacting his lower legs.  When he does ambulate, he is able to do so without experiencing chest pain or dyspnea.  Further, he denies palpitations, PND, orthopnea, dizziness, syncope, or early satiety.  Since being placed on amlodipine in February, he has noticed some worsening of his peripheral neuropathy as well as worsening of ankle edema.  His blood pressures at home have been trending in the 140s to 160s.  Home Medications    Prior to Admission medications   Medication Sig Start Date End Date Taking? Authorizing Provider  amLODipine (NORVASC) 2.5 MG tablet Take 1 tablet (2.5 mg total) by mouth daily. 03/20/20   Luiz Ochoa, NP  aspirin 81 MG chewable tablet Chew by mouth daily.    [provider]  b complex vitamins tablet Take 1 tablet by mouth daily.    [provider]  B-D ULTRA-FINE 33 LANCETS MISC Use 1 Lancet 3 (three) times daily. 05/21/15   [provider]  cetirizine (ZYRTEC) 10 MG tablet Take 10 mg by mouth daily.    [provider]  Cholecalciferol (VITAMIN D3) 1000 units CAPS Take by mouth.    [provider]  clotrimazole-betamethasone (LOTRISONE) cream Apply 1 application topically 2 (two) times daily. 12/26/18   Ronnell Freshwater, NP  Continuous Blood Gluc Sensor ([place]) MISC Use 3 each every 10 (ten) days. 06/17/16   [provider]  enalapril (VASOTEC) 20 MG tablet TAKE 1 TABLET(20 MG) BY MOUTH DAILY 04/17/20   Luiz Ochoa, NP  ezetimibe-simvastatin (VYTORIN) 10-40 MG tablet TAKE 1 TABLET BY MOUTH DAILY 04/17/20   Luiz Ochoa, NP  folic acid (FOLVITE) A999333 MCG tablet Take 400 mcg by mouth daily.    [provider]  Garlic 123XX123 MG CAPS Take by mouth.    [provider]  glipiZIDE  (GLUCOTROL XL) 5 MG 24 hr tablet Take 1 tablet by mouth daily. 07/19/19 07/18/20  [provider]  glucose blood test strip Check blood sugar 5x a day as directed 07/30/15   [provider]  insulin lispro (HUMALOG) 100 UNIT/ML injection Inject 6-34 Units into the skin 3 (three) times daily before meals.     [provider]  Insulin Pen Needle 32G X 4 MM MISC USE AS DIRECTED FOUR TIMES A DAY WITH LEVEMIR FLEXTOUCH AND HUMALOG PEN.  DX CODE E11.29 07/26/16   [provider]  Insulin Syringe-Needle U-100 (BD INSULIN SYRINGE U/F) 31G X 5/16" 0.3 ML MISC Inject insulin  TID as indicated per sliding scale instuctions. 07/14/17   Ronnell Freshwater, NP  JARDIANCE 25 MG TABS tablet Take 0.5 mg by mouth daily.  07/07/17   [provider]  levothyroxine (SYNTHROID, [place]) 50 MCG  tablet Take 50 mcg by mouth daily before breakfast.    [provider]  metFORMIN (GLUCOPHAGE) 1000 MG tablet Take 1,000 mg by mouth daily with breakfast.     [provider]  metFORMIN (GLUCOPHAGE) 500 MG tablet Take 1,000 mg by mouth daily with breakfast.    [provider]  metoprolol succinate (TOPROL-XL) 25 MG 24 hr tablet Take 1 tablet (25 mg total) by mouth daily. 04/21/20 10/18/20  Luiz Ochoa, NP  Multiple Vitamin (MULTIVITAMIN WITH MINERALS) TABS tablet Take 1 tablet by mouth daily.    [provider]  mupirocin ointment (BACTROBAN) 2 % Apply topically two times daily 09/06/17   Ronnell Freshwater, NP  nystatin ointment (MYCOSTATIN) Apply 1 application topically 3 (three) times daily. 01/16/18   Ronnell Freshwater, NP  pneumococcal 23 valent vaccine (PNEUMOVAX 23) 25 MCG/0.5ML injection Inject 0.48m IM once 01/22/19   BRonnell Freshwater NP  traMADol (ULTRAM) 50 MG tablet Take 100 mg by mouth daily.  11/17/15   [provider]  triamcinolone cream (KENALOG) 0.1 % Apply 1 application topically 2 (two) times daily. 10/17/18   BRonnell Freshwater NP   vitamin B-12 (CYANOCOBALAMIN) 1000 MCG tablet Take 1,000 mcg by mouth daily.    [provider]  Zoster Vaccine Adjuvanted ([facility] injection Shingles vaccine series - inject IM as directed . 01/22/19   BRonnell Freshwater NP    Review of Systems    Activity limited by back pain and peripheral neuropathy.  He has general malaise and weakness the other day but this is since resolved.  He denies chest pain, dyspnea, palpitations, PND, orthopnea, dizziness, syncope, or early satiety.  He does have mild, lower extremity/ankle edema, especially at the end of the day.  All other systems reviewed and are otherwise negative except as noted above.  Physical Exam    VS:  BP (!) 142/60 (BP Location: Right Arm, Patient Position: Sitting, Cuff Size: Normal)   Pulse 61   Ht '5\' 8"'$  (1.727 m)   Wt 211 lb 6 oz (95.9 kg)   SpO2 99%   BMI 32.14 kg/m  , BMI Body mass index is 32.14 kg/m. GEN: Well nourished, well developed, in no acute distress. HEENT: normal. Neck: Supple, no JVD, carotid bruits, or masses. Cardiac: RRR, 2/6 systolic murmur at the upper sternal borders, no rubs, or gallops. No clubbing, cyanosis.  Trace bilateral ankle edema.  Radials/PT 1+ and equal bilaterally.  Respiratory:  Respirations regular and unlabored, clear to auscultation bilaterally. GI: Soft, nontender, nondistended, BS + x 4. MS: no deformity or atrophy. Skin: warm and dry, no rash. Neuro:  Strength and sensation are intact. Psych: Normal affect.  Accessory Clinical Findings    ECG personally reviewed by me today -regular sinus rhythm, first-degree AV block, PAC - no acute changes. Twelve-lead rhythm strip today with sinus rhythm and ventricular bigeminy  Lab Results  Component Value Date   WBC 5.9 07/15/2020   HGB 13.2 07/15/2020   HCT 41.4 07/15/2020   MCV 98.1 07/15/2020   PLT 250 07/15/2020   Lab Results  Component Value Date   CREATININE 1.18 07/15/2020   BUN 26 (H) 07/15/2020   NA 135  07/15/2020   K 4.6 07/15/2020   CL 101 07/15/2020   CO2 23 07/15/2020   Lab Results  Component Value Date   ALT 13 02/18/2020   AST 16 02/18/2020   GGT 74 (H) 01/08/2018   ALKPHOS 79 02/18/2020   BILITOT 0.5  02/18/2020   Lab Results  Component Value Date   CHOL 126 02/18/2020   HDL 64 02/18/2020   LDLCALC 46 02/18/2020   TRIG 82 02/18/2020   CHOLHDL 1.9 12/27/2016    Lab Results  Component Value Date   HGBA1C 7.0 (A) 05/01/2020    Assessment & Plan    1.  PVCs/ventricular bigeminy: Patient recently evaluated emergency department secondary to finding of irregular heart rhythm and bradycardia with a rate of 30 by pulse.  In the ED, he was found to be in sinus rhythm with frequent PVCs and bigeminy.  Lab work was unremarkable.  He is asymptomatic and denies any recent history of chest pain, dyspnea, or palpitations.  He is on low-dose metoprolol 25 mg daily with the dose being limited by prior history of baseline bradycardia.  We will place a 3-day Zio monitor to evaluate his overall burden of PVCs, and arrange for follow-up echocardiogram to rule out structural abnormalities.  2.  Coronary artery disease: Status post myocardial infarction in 1989 with PTCA at that time but subsequent cath revealing an occluded vessel with good collaterals.  He has been medically managed ever since.  He has not been having any chest pain or dyspnea.  No indication for ischemic testing at this time however, if echocardiogram is abnormal, will need to reconsider.  He remains on aspirin, beta-blocker, and Vytorin therapy.  3.  Essential hypertension: Blood pressure elevated today and patient notes that this is pretty typical for him as he is trending in the 140s to 170s at home.  He was placed on amlodipine in February but has not noticed any significant change in blood pressure since then and in fact, has had some worsening of chronic, mild lower extremity swelling as well as pain in the setting of  peripheral neuropathy.  We discussed options for management of blood pressure today.  I am going to discontinue his amlodipine and switch his enalapril to valsartan and titrate to 320 mg daily.  He will continue to follow blood pressures at home and follow-up for a basic metabolic panel in 1 week.  4.  Hyperlipidemia: On Vytorin with an LDL of 46 in January.  Normal AST/ALT at that time.  5.  Peripheral arterial disease: Followed closely by vascular surgery.  No significant claudication and stable ABIs in November.  Remains on aspirin and statin therapy.  6.  Type 2 diabetes mellitus: A1c 7.0 in March.  He remains on insulin, metformin, and empagliflozin.  In addition, he is on statin and ACE inhibitor, which will be switched to an ARB as outlined above.  7.  OSA:  Uses CPAP.  8.  Disposition: Follow-up Zio monitoring and echocardiogram in the setting of PVCs as outlined above.  Follow-up basic metabolic panel in 1 week given change from enalapril to valsartan.  Follow-up in clinic in 4 to 6 weeks or sooner if necessary.  Murray Hodgkins, NP 07/17/2020, 12:10 PM

## 2020-07-17 NOTE — Patient Instructions (Addendum)
Medication Instructions:  Your physician has recommended you make the following change in your medication:   1. STOP Amlodipine 2. STOP Enalapril 3. START Valsartan 320 mg once daily  *If you need a refill on your cardiac medications before your next appointment, please call your pharmacy*   Lab Work: BMET in 1 week over at the [employer] at [facility] 1st desk on the right to check in, past the screening table Lab hours: Monday- Friday (7:30 am- 5:30 pm) No appointment is needed for this.   If you have labs (blood work) drawn today and your tests are completely normal, you will receive your results only by: Marland Kitchen MyChart Message (if you have MyChart) OR . A paper copy in the mail If you have any lab test that is abnormal or we need to change your treatment, we will call you to review the results.   Testing/Procedures: Your physician has requested that you have an echocardiogram. Echocardiography is a painless test that uses sound waves to create images of your heart. It provides your doctor with information about the size and shape of your heart and how well your heart's chambers and valves are working. This procedure takes approximately one hour. There are no restrictions for this procedure.  Your physician has recommended that you wear a Zio monitor for 3 days.   This monitor is a medical device that records the heart's electrical activity. Doctors most often use these monitors to diagnose arrhythmias. Arrhythmias are problems with the speed or rhythm of the heartbeat. The monitor is a small device applied to your chest. You can wear one while you do your normal daily activities. While wearing this monitor if you have any symptoms to push the button and record what you felt. Once you have worn this monitor for the period of time provider prescribed (Usually 14 days), you will return the monitor device in the postage paid box. Once it is returned they will download the data collected  and provide [place] with a report which the provider will then review and we will call you with those results. Important tips:  1. Avoid showering during the first 24 hours of wearing the monitor. 2. Avoid excessive sweating to help maximize wear time. 3. Do not submerge the device, no hot tubs, and no swimming pools. 4. Keep any lotions or oils away from the patch. 5. After 24 hours you may shower with the patch on. Take brief showers with your back facing the shower head.  6. Do not remove patch once it has been placed because that will interrupt data and decrease adhesive wear time. 7. Push the button when you have any symptoms and write down what you were feeling. 8. Once you have completed wearing your monitor, remove and place into box which has postage paid and place in your outgoing mailbox.  9. If for some reason you have misplaced your box then call our office and we can provide another box and/or mail it off for you.       Follow-Up: At [facility], you and your health needs are our priority.  As part of our continuing mission to provide you with exceptional heart care, we have created designated Provider Care Teams.  These Care Teams include your primary Cardiologist (physician) and Advanced Practice Providers (APPs -  Physician Assistants and Nurse Practitioners) who all work together to provide you with the care you need, when you need it.  We recommend signing up for the patient  portal called "MyChart".  Sign up information is provided on this After Visit Summary.  MyChart is used to connect with patients for Virtual Visits (Telemedicine).  Patients are able to view lab/test results, encounter notes, upcoming appointments, etc.  Non-urgent messages can be sent to your provider as well.   To learn more about what you can do with MyChart, go to [email or web address].    Your next appointment:   4 to 6 week(s)  The format for your next appointment:   In Person  Provider:    [name], MD or Murray Hodgkins, NP

## 2020-07-24 ENCOUNTER — Other Ambulatory Visit
Admission: RE | Admit: 2020-07-24 | Discharge: 2020-07-24 | Disposition: A | Discharge status: Home / Self Care | Payer: Medicare Other | Source: Ambulatory Visit | Attending: Nurse Practitioner | Admitting: Nurse Practitioner

## 2020-07-24 DIAGNOSIS — I493 Ventricular premature depolarization: Secondary | ICD-10-CM | POA: Diagnosis not present

## 2020-07-24 DIAGNOSIS — I498 Other specified cardiac arrhythmias: Secondary | ICD-10-CM | POA: Insufficient documentation

## 2020-07-24 LAB — BASIC METABOLIC PANEL
Anion gap: 8 (ref 5–15)
BUN: 27 mg/dL — ABNORMAL HIGH (ref 8–23)
CO2: 23 mmol/L (ref 22–32)
Calcium: 9.3 mg/dL (ref 8.9–10.3)
Chloride: 106 mmol/L (ref 98–111)
Creatinine, Ser: 1.16 mg/dL (ref 0.61–1.24)
GFR, Estimated: >60 mL/min (ref >60)
Glucose, Bld: 96 mg/dL (ref 70–99)
Potassium: 4.3 mmol/L (ref 3.5–5.1)
Sodium: 137 mmol/L (ref 135–145)

## 2020-07-29 DIAGNOSIS — R001 Bradycardia, unspecified: Secondary | ICD-10-CM | POA: Diagnosis not present

## 2020-07-29 DIAGNOSIS — R002 Palpitations: Secondary | ICD-10-CM | POA: Diagnosis not present

## 2020-07-29 DIAGNOSIS — I498 Other specified cardiac arrhythmias: Secondary | ICD-10-CM | POA: Diagnosis not present

## 2020-07-31 ENCOUNTER — Encounter: Payer: Self-pay | Admitting: Physician Assistant

## 2020-07-31 ENCOUNTER — Other Ambulatory Visit: Payer: Self-pay

## 2020-07-31 ENCOUNTER — Ambulatory Visit (INDEPENDENT_AMBULATORY_CARE_PROVIDER_SITE_OTHER): Payer: Medicare Other | Admitting: Physician Assistant

## 2020-07-31 DIAGNOSIS — I10 Essential (primary) hypertension: Secondary | ICD-10-CM

## 2020-07-31 DIAGNOSIS — R001 Bradycardia, unspecified: Secondary | ICD-10-CM | POA: Diagnosis not present

## 2020-07-31 DIAGNOSIS — Z794 Long term (current) use of insulin: Secondary | ICD-10-CM

## 2020-07-31 DIAGNOSIS — G4733 Obstructive sleep apnea (adult) (pediatric): Secondary | ICD-10-CM | POA: Diagnosis not present

## 2020-07-31 DIAGNOSIS — I2510 Atherosclerotic heart disease of native coronary artery without angina pectoris: Secondary | ICD-10-CM | POA: Diagnosis not present

## 2020-07-31 DIAGNOSIS — N1831 Chronic kidney disease, stage 3a: Secondary | ICD-10-CM

## 2020-07-31 DIAGNOSIS — E1122 Type 2 diabetes mellitus with diabetic chronic kidney disease: Secondary | ICD-10-CM | POA: Diagnosis not present

## 2020-07-31 NOTE — Progress Notes (Signed)
[facility] [place], [place] [address]  Internal MEDICINE  Office Visit Note  Patient Name: Allen [name]  [id number]  [id number]  Date of Service: 08/03/2020  Chief Complaint  Patient presents with   Follow-up   Hyperlipidemia   Hypertension   Diabetes    Pt see endocrinology    HPI Pt is here for routine follow up.  -We have been monitoring his kidneys. He has had renal US that was normal. -Metformin was decreased to 500 BID.  This may change again in the next few weeks. He is followed by endocrinology for this. -BP at home 130-140/50-60, cardiologist changed one of his medications but unsure which one and will call office to let us know. Had recent ED visit for bradycardia which is being followed by cardiology -Wearing CPAP nightly  Current Medication: Outpatient Encounter Medications as of 07/31/2020  Medication Sig   aspirin 81 MG chewable tablet Chew by mouth daily.   b complex vitamins tablet Take 1 tablet by mouth daily.   B-D ULTRA-FINE 33 LANCETS MISC Use 1 Lancet 3 (three) times daily.   cetirizine (ZYRTEC) 10 MG tablet Take 10 mg by mouth daily.   Cholecalciferol (VITAMIN D3) 1000 units CAPS Take by mouth.   clotrimazole-betamethasone (LOTRISONE) cream Apply 1 application topically 2 (two) times daily.   Continuous Blood Gluc Sensor (FREESTYLE LIBRE SENSOR SYSTEM) MISC Use 3 each every 10 (ten) days.   ezetimibe-simvastatin (VYTORIN) 10-40 MG tablet TAKE 1 TABLET BY MOUTH DAILY   folic acid (FOLVITE) A999333 MCG tablet Take 400 mcg by mouth daily.   Garlic 123XX123 MG CAPS Take by mouth.   glucose blood test strip Check blood sugar 5x a day as directed   insulin lispro (HUMALOG) 100 UNIT/ML injection Inject 6-34 Units into the skin 3 (three) times daily before meals.    Insulin Pen Needle 32G X 4 MM MISC USE AS DIRECTED FOUR TIMES A DAY WITH LEVEMIR FLEXTOUCH AND HUMALOG PEN.  DX CODE E11.29   Insulin Syringe-Needle U-100 (BD INSULIN SYRINGE U/F) 31G  X 5/16" 0.3 ML MISC Inject insulin [place] TID as indicated per sliding scale instuctions.   JARDIANCE 25 MG TABS tablet Take 0.5 mg by mouth daily.    levothyroxine (SYNTHROID, LEVOTHROID) 50 MCG tablet Take 50 mcg by mouth daily before breakfast.   metFORMIN (GLUCOPHAGE) 500 MG tablet Take 500 mg by mouth 2 (two) times daily with a meal.   metoprolol succinate (TOPROL-XL) 25 MG 24 hr tablet Take 1 tablet (25 mg total) by mouth daily.   Multiple Vitamin (MULTIVITAMIN WITH MINERALS) TABS tablet Take 1 tablet by mouth daily.   mupirocin ointment (BACTROBAN) 2 % Apply topically two times daily   nystatin ointment (MYCOSTATIN) Apply 1 application topically 3 (three) times daily.   traMADol (ULTRAM) 50 MG tablet Take 100 mg by mouth daily.    triamcinolone cream (KENALOG) 0.1 % Apply 1 application topically 2 (two) times daily.   valsartan (DIOVAN) 320 MG tablet Take 1 tablet (320 mg total) by mouth daily.   vitamin B-12 (CYANOCOBALAMIN) 1000 MCG tablet Take 1,000 mcg by mouth daily.   Zoster Vaccine Adjuvanted [facility]) injection Shingles vaccine series - inject IM as directed .   [DISCONTINUED] pneumococcal 23 valent vaccine (PNEUMOVAX 23) 25 MCG/0.5ML injection Inject 0.17m IM once   glipiZIDE (GLUCOTROL XL) 5 MG 24 hr tablet Take 1 tablet by mouth daily.   No facility-administered encounter medications on file as of 07/31/2020.    Surgical  History: Past Surgical History:  Procedure Laterality Date   CARDIAC CATHETERIZATION  1989   CARDIAC CATHETERIZATION  1990   GALLBLADDER SURGERY     LEG SURGERY Right    stent placement    Medical History: Past Medical History:  Diagnosis Date   Coronary artery disease    a. 1989 s/p MI w/ PTCA to unknown vessel. Reports cath 6 months later with occluded vessel and collaterals--> medically managed since..   Diabetes mellitus without complication ([place])    Hyperlipidemia LDL goal <70    Hypertension    a. 02/2020 nl renal artery duplex.   MI  (myocardial infarction) ([place])    PAD (peripheral artery disease) ([place])    a. 12/2019 ABIs: R 1.02, L 0.56-->unchanged.   Sleep apnea     Family History: Family History  Problem Relation Age of Onset   Diabetes Mother    Brain cancer Mother    Throat cancer Father    Prostate cancer Brother     Social History   Socioeconomic History   Marital status: Married    Spouse name: Not on file   Number of children: Not on file   Years of education: Not on file   Highest education level: Not on file  Occupational History   Not on file  Tobacco Use   Smoking status: Former    Packs/day: 3.00    Years: 32.00    Pack years: 96.00    Types: Cigarettes   Smokeless tobacco: Never  Vaping Use   Vaping Use: Never used  Substance and Sexual Activity   Alcohol use: No   Drug use: No   Sexual activity: Not on file  Other Topics Concern   Not on file  Social History Narrative   Not on file   Social Determinants of Health   Financial Resource Strain: Not on file  Food Insecurity: Not on file  Transportation Needs: Not on file  Physical Activity: Not on file  Stress: Not on file  Social Connections: Not on file  Intimate Partner Violence: Not on file      Review of Systems  Constitutional:  Negative for chills, fatigue and unexpected weight change.  HENT:  Negative for congestion, postnasal drip, rhinorrhea, sneezing and sore throat.   Eyes:  Negative for redness.  Respiratory:  Negative for cough, chest tightness and shortness of breath.   Cardiovascular:  Negative for chest pain and palpitations.  Gastrointestinal:  Negative for abdominal pain, constipation, diarrhea, nausea and vomiting.  Genitourinary:  Negative for dysuria and frequency.  Musculoskeletal:  Negative for arthralgias, back pain, joint swelling and neck pain.  Skin:  Negative for rash.  Neurological:  Negative for tremors and numbness.  Hematological:  Negative for adenopathy. Does not bruise/bleed easily.   Psychiatric/Behavioral:  Negative for behavioral problems (Depression), sleep disturbance and suicidal ideas. The patient is not nervous/anxious.    Vital Signs: BP (!) 149/60   Pulse (!) 52   Temp 97.8 F (36.6 C)   Resp 16   Ht '5\' 8"'$  (1.727 m)   Wt 214 lb (97.1 kg)   SpO2 97%   BMI 32.54 kg/m    Physical Exam Vitals and nursing note reviewed.  Constitutional:      General: He is not in acute distress.    Appearance: He is well-developed. He is obese. He is not diaphoretic.  HENT:     Head: Normocephalic and atraumatic.     Mouth/Throat:     Pharynx: No  oropharyngeal exudate.  Eyes:     Pupils: Pupils are equal, round, and reactive to light.  Neck:     Thyroid: No thyromegaly.     Vascular: No JVD.     Trachea: No tracheal deviation.  Cardiovascular:     Rate and Rhythm: Regular rhythm. Bradycardia present.     Heart sounds: Normal heart sounds. No murmur heard.   No friction rub. No gallop.  Pulmonary:     Effort: Pulmonary effort is normal. No respiratory distress.     Breath sounds: No wheezing or rales.  Chest:     Chest wall: No tenderness.  Abdominal:     General: Bowel sounds are normal.     Palpations: Abdomen is soft.  Musculoskeletal:        General: Normal range of motion.     Cervical back: Normal range of motion and neck supple.  Lymphadenopathy:     Cervical: No cervical adenopathy.  Skin:    General: Skin is warm and dry.  Neurological:     Mental Status: He is alert and oriented to person, place, and time.     Cranial Nerves: No cranial nerve deficit.  Psychiatric:        Behavior: Behavior normal.        Thought Content: Thought content normal.        Judgment: Judgment normal.       Assessment/Plan: 1. Type 2 diabetes mellitus with stage 3a chronic kidney disease, with long-term current use of insulin (HCC) Followed by endocrinology, metformin dose decreased due to abnormal kidney function and labs since improved  2. Essential  hypertension Mildly elevated in office, cardiology following and adjusting meds due to recent bradycardia  3. 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 Coronary artery disease involving native coronary artery of native heart without angina pectoris Continue ASA and Vytorin, followed by cardiology  5. Obstructive sleep apnea Continue CPAP nightly   General Counseling: gregoire favinger understanding of the findings of todays visit and agrees with plan of treatment. I have discussed any further diagnostic evaluation that may be needed or ordered today. We also reviewed his medications today. he has been encouraged to call the office with any questions or concerns that should arise related to todays visit.    No orders of the defined types were placed in this encounter.   No orders of the defined types were placed in this encounter.   This patient was seen by Drema Dallas, PA-C in collaboration with Dr. Clayborn Bigness as a part of collaborative care agreement.   Total time spent:30 Minutes Time spent includes review of chart, medications, test results, and follow up plan with the patient.      Dr Lavera Guise Internal medicine

## 2020-08-14 ENCOUNTER — Telehealth: Payer: Self-pay | Admitting: *Deleted

## 2020-08-14 DIAGNOSIS — I493 Ventricular premature depolarization: Secondary | ICD-10-CM

## 2020-08-14 DIAGNOSIS — R001 Bradycardia, unspecified: Secondary | ICD-10-CM

## 2020-08-14 DIAGNOSIS — R002 Palpitations: Secondary | ICD-10-CM

## 2020-08-14 NOTE — Telephone Encounter (Signed)
-----   Message from Rise Mu, PA-C sent at 08/11/2020  5:14 PM EDT ----- Cardiac monitor showed a predominant rhythm of sinus with an average rate of 57 bpm (range 40-128 bpm), 2 episodes of SVT (fast rhythm from the top portion of the heart) were noted with the fastest and longest interval lasting 20 beats with a maximum rate of 128 bpm, rare extra beats from the top portion of the heart and frequent extra beats from the bottom portion of the heart representing 21% of all beats.   Recommendations: -Bradycardia precludes escalation of beta blocker -Continue with planned echo and follow up in 08/2020 -Refer him to EP for further management of PVCs, this appointment should occur after he has had his echo, which is scheduled for 09/02/2020

## 2020-08-14 NOTE — Telephone Encounter (Signed)
Reviewed results and recommendations with patient. Confirmed appointments for echocardiogram and follow up appointment. Also discussed referral over to EP provider once he has that echo done. He was agreeable with plan and had no further questions at this time.

## 2020-08-20 ENCOUNTER — Other Ambulatory Visit: Payer: Self-pay

## 2020-08-20 ENCOUNTER — Ambulatory Visit (INDEPENDENT_AMBULATORY_CARE_PROVIDER_SITE_OTHER): Payer: Medicare Other

## 2020-08-20 DIAGNOSIS — G4733 Obstructive sleep apnea (adult) (pediatric): Secondary | ICD-10-CM

## 2020-08-20 NOTE — Progress Notes (Signed)
95 percentile pressure 9   95th percentile leak 32.2   apnea index 0.5 /hr  apnea-hypopnea index  1.6 /hr   total days used  >4 hr 30 days  total days used <4 hr 0 days  Total compliance 100 percent   He is doing great no problems or questions at this time

## 2020-08-21 ENCOUNTER — Telehealth: Payer: Self-pay

## 2020-08-21 NOTE — Telephone Encounter (Signed)
Tried contacting patient but no voice mail was set up for me to leave a message. Patient was just seen for a pcp appt. I cancelled his appt in September and r/s for October. Please advise patient if they call back. Allen [name]

## 2020-08-28 DIAGNOSIS — E039 Hypothyroidism, unspecified: Secondary | ICD-10-CM | POA: Diagnosis not present

## 2020-08-28 DIAGNOSIS — N181 Chronic kidney disease, stage 1: Secondary | ICD-10-CM | POA: Diagnosis not present

## 2020-08-28 DIAGNOSIS — E669 Obesity, unspecified: Secondary | ICD-10-CM | POA: Diagnosis not present

## 2020-08-28 DIAGNOSIS — E785 Hyperlipidemia, unspecified: Secondary | ICD-10-CM | POA: Diagnosis not present

## 2020-08-28 DIAGNOSIS — Z992 Dependence on renal dialysis: Secondary | ICD-10-CM | POA: Diagnosis not present

## 2020-08-28 DIAGNOSIS — E1169 Type 2 diabetes mellitus with other specified complication: Secondary | ICD-10-CM | POA: Diagnosis not present

## 2020-08-28 DIAGNOSIS — Z794 Long term (current) use of insulin: Secondary | ICD-10-CM | POA: Diagnosis not present

## 2020-08-28 DIAGNOSIS — N186 End stage renal disease: Secondary | ICD-10-CM | POA: Diagnosis not present

## 2020-08-28 DIAGNOSIS — E1122 Type 2 diabetes mellitus with diabetic chronic kidney disease: Secondary | ICD-10-CM | POA: Diagnosis not present

## 2020-08-28 DIAGNOSIS — I152 Hypertension secondary to endocrine disorders: Secondary | ICD-10-CM | POA: Diagnosis not present

## 2020-08-28 DIAGNOSIS — E1159 Type 2 diabetes mellitus with other circulatory complications: Secondary | ICD-10-CM | POA: Diagnosis not present

## 2020-09-02 ENCOUNTER — Other Ambulatory Visit: Payer: Self-pay

## 2020-09-02 ENCOUNTER — Ambulatory Visit (INDEPENDENT_AMBULATORY_CARE_PROVIDER_SITE_OTHER): Payer: Medicare Other

## 2020-09-02 DIAGNOSIS — R001 Bradycardia, unspecified: Secondary | ICD-10-CM | POA: Diagnosis not present

## 2020-09-02 DIAGNOSIS — I498 Other specified cardiac arrhythmias: Secondary | ICD-10-CM

## 2020-09-02 LAB — ECHOCARDIOGRAM COMPLETE
AR max vel: 2.75 cm2
AV Area VTI: 3.1 cm2
AV Area mean vel: 2.68 cm2
AV Mean grad: 4 mmHg
AV Peak grad: 7.4 mmHg
Ao pk vel: 1.36 m/s
Area-P 1/2: 4.89 cm2
S' Lateral: 3.1 cm

## 2020-09-02 MED ORDER — PERFLUTREN LIPID MICROSPHERE
1.0000 mL | INTRAVENOUS | Status: AC | PRN
  Administered 2020-09-02: 2 mL via INTRAVENOUS

## 2020-09-04 ENCOUNTER — Telehealth: Payer: Self-pay | Admitting: *Deleted

## 2020-09-04 NOTE — Telephone Encounter (Signed)
Reviewed results and recommendations with patient. He was agreeable to cancel appointment and keep upcoming appointment with Dr. Quentin Ore. He verbalized understanding with no further questions at this time.

## 2020-09-04 NOTE — Telephone Encounter (Signed)
-----   Message from Theora Gianotti, NP sent at 09/03/2020  7:30 AM EDT ----- Normal heart squeezing function with mild thickness and stiffness of the left ventricle.  No significant valvular disease.  Overall reassuring findings, as we performed this to evaluate his heart squeeze in the setting of frequent PVCs (keep f/u with EP given monitor findings).  With ongoing heart muscle thickness and stiffness, we must really continue to focus on good BP control, sodium restriction, and regular activity.  As he's seeing EP on 8/3, unless there are other issues currently, he can likely cancel his 7/25 appt w/ J. Visser.

## 2020-09-04 NOTE — Telephone Encounter (Signed)
No answer/No voicemail box set up.  

## 2020-09-08 ENCOUNTER — Ambulatory Visit: Payer: Medicare Other | Admitting: Physician Assistant

## 2020-09-17 ENCOUNTER — Encounter: Payer: Self-pay | Admitting: Cardiology

## 2020-09-17 ENCOUNTER — Other Ambulatory Visit: Payer: Self-pay

## 2020-09-17 ENCOUNTER — Ambulatory Visit (INDEPENDENT_AMBULATORY_CARE_PROVIDER_SITE_OTHER): Payer: Medicare Other | Admitting: Cardiology

## 2020-09-17 VITALS — BP 130/58 | HR 64 | Ht 68.0 in | Wt 214.0 lb

## 2020-09-17 DIAGNOSIS — I10 Essential (primary) hypertension: Secondary | ICD-10-CM

## 2020-09-17 DIAGNOSIS — I493 Ventricular premature depolarization: Secondary | ICD-10-CM | POA: Diagnosis not present

## 2020-09-17 DIAGNOSIS — I2510 Atherosclerotic heart disease of native coronary artery without angina pectoris: Secondary | ICD-10-CM | POA: Diagnosis not present

## 2020-09-17 NOTE — Progress Notes (Signed)
Electrophysiology Office Note:    Date:  09/17/2020   ID:  Khale Brey, DOB [birth year], MRN [id number]  PCP:  [name], PA-C  [facility] Cardiologist:  [name], MD  [facility] Electrophysiologist:  None   Referring MD: Rise Mu, PA-C   Chief Complaint: PVCs and bradycardia  History of Present Illness:    Lenis Mohl is a 80 y.o. male who presents for an evaluation of PVCs and bradycardia at the request of Christell Faith, PA-C. Their medical history includes coronary artery disease post PCI in the 1980s, diabetes, hypertension, peripheral arterial disease, sleep apnea.  The patient was last seen by Jorja Loa on July 17, 2020.  The patient was seen in the emergency department on May 31 for symptomatic bradycardia.  He has been seen in the primary care office.  During the visit his heart rate was recorded as 30 bpm.  He went to the emergency department from there where he was found to have a heart rate of 59 beats a minute.  The patient tells me that the day he went to the emergency department he was completely asymptomatic.  He does not notice the PVCs.  No lightheadedness or dizziness.  No syncope.  Past Medical History:  Diagnosis Date   Coronary artery disease    a. 1989 s/p MI w/ PTCA to unknown vessel. Reports cath 6 months later with occluded vessel and collaterals--> medically managed since..   Diabetes mellitus without complication ([place])    Hyperlipidemia LDL goal <70    Hypertension    a. 02/2020 nl renal artery duplex.   MI (myocardial infarction) ([place])    PAD (peripheral artery disease) ([place])    a. 12/2019 ABIs: R 1.02, L 0.56-->unchanged.   Sleep apnea     Past Surgical History:  Procedure Laterality Date   [place]     LEG SURGERY Right    stent placement    Current Medications: Current Meds  Medication Sig   aspirin 81 MG chewable tablet Chew by mouth daily.   b  complex vitamins tablet Take 1 tablet by mouth daily.   B-D ULTRA-FINE 33 LANCETS MISC Use 1 Lancet 3 (three) times daily.   cetirizine (ZYRTEC) 10 MG tablet Take 10 mg by mouth daily.   Cholecalciferol (VITAMIN D3) 1000 units CAPS Take by mouth.   clotrimazole-betamethasone (LOTRISONE) cream Apply 1 application topically 2 (two) times daily.   Continuous Blood Gluc Sensor (FREESTYLE LIBRE SENSOR SYSTEM) MISC Use 3 each every 10 (ten) days.   ezetimibe-simvastatin (VYTORIN) 10-40 MG tablet TAKE 1 TABLET BY MOUTH DAILY   folic acid (FOLVITE) A999333 MCG tablet Take 400 mcg by mouth daily.   Garlic 123XX123 MG CAPS Take by mouth.   glipiZIDE (GLUCOTROL XL) 5 MG 24 hr tablet Take 1 tablet by mouth daily.   glucose blood test strip Check blood sugar 5x a day as directed   insulin lispro (HUMALOG) 100 UNIT/ML injection Inject 6-34 Units into the skin 3 (three) times daily before meals.    Insulin Pen Needle 32G X 4 MM MISC USE AS DIRECTED FOUR TIMES A DAY WITH LEVEMIR FLEXTOUCH AND HUMALOG PEN.  DX CODE E11.29   Insulin Syringe-Needle U-100 (BD INSULIN SYRINGE U/F) 31G X 5/16" 0.3 ML MISC Inject insulin [place] TID as indicated per sliding scale instuctions.   JARDIANCE 25 MG TABS tablet Take 0.5 mg by mouth daily.  levothyroxine (SYNTHROID, LEVOTHROID) 50 MCG tablet Take 50 mcg by mouth daily before breakfast.   metFORMIN (GLUCOPHAGE) 500 MG tablet Take 500 mg by mouth 2 (two) times daily with a meal.   metoprolol succinate (TOPROL-XL) 25 MG 24 hr tablet Take 1 tablet (25 mg total) by mouth daily.   Multiple Vitamin (MULTIVITAMIN WITH MINERALS) TABS tablet Take 1 tablet by mouth daily.   mupirocin ointment (BACTROBAN) 2 % Apply topically two times daily   nystatin ointment (MYCOSTATIN) Apply 1 application topically 3 (three) times daily.   traMADol (ULTRAM) 50 MG tablet Take 100 mg by mouth daily.    triamcinolone cream (KENALOG) 0.1 % Apply 1 application topically 2 (two) times daily.   valsartan (DIOVAN)  320 MG tablet Take 1 tablet (320 mg total) by mouth daily.   vitamin B-12 (CYANOCOBALAMIN) 1000 MCG tablet Take 1,000 mcg by mouth daily.     Allergies:   Iodinated diagnostic agents, Iohexol, Duloxetine, Dye fdc red [red dye], Fluticasone, Gabapentin, Penicillins, and Other   Social History   Socioeconomic History   Marital status: Married    Spouse name: Not on file   Number of children: Not on file   Years of education: Not on file   Highest education level: Not on file  Occupational History   Not on file  Tobacco Use   Smoking status: Former    Packs/day: 3.00    Years: 32.00    Pack years: 96.00    Types: Cigarettes   Smokeless tobacco: Never  Vaping Use   Vaping Use: Never used  Substance and Sexual Activity   Alcohol use: No   Drug use: No   Sexual activity: Not on file  Other Topics Concern   Not on file  Social History Narrative   Not on file   Social Determinants of Health   Financial Resource Strain: Not on file  Food Insecurity: Not on file  Transportation Needs: Not on file  Physical Activity: Not on file  Stress: Not on file  Social Connections: Not on file     Family History: The patient's family history includes Brain cancer in his mother; Diabetes in his mother; Prostate cancer in his brother; Throat cancer in his father.  ROS:   Please see the history of present illness.    All other systems reviewed and are negative.  EKGs/Labs/Other Studies Reviewed:    The following studies were reviewed today:  Prior notes  August 11, 2020 ZIO monitor personally reviewed PVC burden 21% Predominant underlying rhythm was Sinus Rhythm. First Degree AV Block was present. 2 Supraventricular Tachycardia runs occurred, the run with the fastest interval lasting 20 beats with a max rate of 128 bpm (avg 116 bpm); the run with the fastest interval was also the longest. Rare PACs. Frequent PVCs with a burden of 21%.  September 02, 2020 echo personally reviewed Left  ventricular function normal, 55% Mild LVH Right ventricular function is normal No significant valvular abnormalities   July 17, 2020 EKG personally reviewed Very frequent PVCs, monomorphic.  PVCs have a left superior axis and are likely coming from the posterior medial Pap muscle versus the inferoseptum.     EKG:  The ekg ordered today demonstrates sinus rhythm.  No PVCs.  Inferior Q waves.  First-degree AV delay.  Recent Labs: 02/18/2020: ALT 13 07/15/2020: Hemoglobin 13.2; Magnesium 2.5; Platelets 250; TSH 3.393 07/24/2020: BUN 27; Creatinine, Ser 1.16; Potassium 4.3; Sodium 137  Recent Lipid Panel    Component Value  Date/Time   CHOL 126 02/18/2020 0846   TRIG 82 02/18/2020 0846   HDL 64 02/18/2020 0846   CHOLHDL 1.9 12/27/2016 1445   VLDL 18 12/27/2016 1445   LDLCALC 46 02/18/2020 0846    Physical Exam:    VS:  BP (!) 130/58 (BP Location: Left Arm, Patient Position: Sitting, Cuff Size: Normal)   Pulse 64   Ht '5\' 8"'$  (1.727 m)   Wt 214 lb (97.1 kg)   SpO2 97%   BMI 32.54 kg/m     Wt Readings from Last 3 Encounters:  09/17/20 214 lb (97.1 kg)  07/31/20 214 lb (97.1 kg)  07/17/20 211 lb 6 oz (95.9 kg)     GEN:  Well nourished, well developed in no acute distress HEENT: Normal NECK: No JVD; No carotid bruits LYMPHATICS: No lymphadenopathy CARDIAC: RRR, no murmurs, rubs, gallops RESPIRATORY:  Clear to auscultation without rales, wheezing or rhonchi  ABDOMEN: Soft, non-tender, non-distended MUSCULOSKELETAL:  No edema; No deformity  SKIN: Warm and dry NEUROLOGIC:  Alert and oriented x 3 PSYCHIATRIC:  Normal affect   ASSESSMENT:    1. PVC's (premature ventricular contractions)   2. Essential hypertension   3. Coronary artery disease involving native coronary artery of native heart without angina pectoris    PLAN:    In order of problems listed above:  1. 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  Left  ventricular function is normal.  He is completely asymptomatic with these.  I discussed the management options including continued conservative management, antiarrhythmic drug therapy or invasive EP study.  Given he is asymptomatic and has normal left ventricular function, I would favor a conservative management strategy.  I will plan to have him see one of the PA/NP's in 12 months.  He will reach out to [place] if his symptoms change.  2. Essential hypertension Controlled.  3. Coronary artery disease involving native coronary artery of native heart without angina pectoris No ischemic symptoms.   Total time of encounter: 45 minutes total time of encounter, including face-to-face patient care, review of medical records and testing, coordination of care and counseling.      Medication Adjustments/Labs and Tests Ordered: Current medicines are reviewed at length with the patient today.  Concerns regarding medicines are outlined above.  No orders of the defined types were placed in this encounter.  No orders of the defined types were placed in this encounter.    Signed, Hilton Cork. Quentin Ore, MD, [facility], [facility] 09/17/2020 2:52 PM    Electrophysiology  [facility]

## 2020-09-17 NOTE — Patient Instructions (Signed)
Medication Instructions:  Your physician recommends that you continue on your current medications as directed. Please refer to the Current Medication list given to you today. *If you need a refill on your cardiac medications before your next appointment, please call your pharmacy*  Lab Work: None ordered. If you have labs (blood work) drawn today and your tests are completely normal, you will receive your results only by: [place] (if you have MyChart) OR A paper copy in the mail If you have any lab test that is abnormal or we need to change your treatment, we will call you to review the results.  Testing/Procedures: None ordered.  Follow-Up: At [facility], you and your health needs are our priority.  As part of our continuing mission to provide you with exceptional heart care, we have created designated Provider Care Teams.  These Care Teams include your primary Cardiologist (physician) and Advanced Practice Providers (APPs -  Physician Assistants and Nurse Practitioners) who all work together to provide you with the care you need, when you need it.  Your next appointment:   Your physician wants you to follow-up in: AS NEEDED WITH Vickie Epley, MD

## 2020-10-16 ENCOUNTER — Ambulatory Visit: Payer: Medicare Other | Admitting: Physician Assistant

## 2020-10-24 ENCOUNTER — Telehealth: Payer: Self-pay

## 2020-10-24 ENCOUNTER — Other Ambulatory Visit: Payer: Self-pay

## 2020-10-24 MED ORDER — METOPROLOL SUCCINATE ER 25 MG PO TB24: 25.0000 mg | ORAL_TABLET | Freq: Every day | ORAL | 1 refills | Status: DC

## 2020-10-24 NOTE — Telephone Encounter (Signed)
Patient called stating he needs his metoprolol succinate (TOPROL-XL) 25 MG 24 hr tablet refilled. Sent message to nurses to send refill to [place] [id number] if medication qualifies for refill.

## 2020-11-03 ENCOUNTER — Telehealth: Payer: Self-pay

## 2020-11-03 DIAGNOSIS — Z03818 Encounter for observation for suspected exposure to other biological agents ruled out: Secondary | ICD-10-CM | POA: Diagnosis not present

## 2020-11-03 DIAGNOSIS — Z20822 Contact with and (suspected) exposure to covid-19: Secondary | ICD-10-CM | POA: Diagnosis not present

## 2020-11-04 ENCOUNTER — Encounter: Payer: Self-pay | Admitting: Nurse Practitioner

## 2020-11-04 ENCOUNTER — Other Ambulatory Visit: Payer: Self-pay

## 2020-11-04 ENCOUNTER — Telehealth (INDEPENDENT_AMBULATORY_CARE_PROVIDER_SITE_OTHER): Payer: Medicare Other | Admitting: Nurse Practitioner

## 2020-11-04 VITALS — BP 144/48 | HR 58 | Temp 97.1°F | Resp 16 | Ht 67.0 in | Wt 218.0 lb

## 2020-11-04 DIAGNOSIS — J0140 Acute pansinusitis, unspecified: Secondary | ICD-10-CM

## 2020-11-04 DIAGNOSIS — B9689 Other specified bacterial agents as the cause of diseases classified elsewhere: Secondary | ICD-10-CM

## 2020-11-04 DIAGNOSIS — J208 Acute bronchitis due to other specified organisms: Secondary | ICD-10-CM | POA: Diagnosis not present

## 2020-11-04 DIAGNOSIS — R062 Wheezing: Secondary | ICD-10-CM | POA: Diagnosis not present

## 2020-11-04 MED ORDER — PREDNISONE 10 MG PO TABS: ORAL_TABLET | ORAL | 0 refills | Status: DC

## 2020-11-04 MED ORDER — AZITHROMYCIN 250 MG PO TABS
ORAL_TABLET | ORAL | 0 refills | Status: DC
Start: 2020-11-04 — End: 2020-12-04

## 2020-11-04 NOTE — Progress Notes (Signed)
[facility] [place], [place] [address]  Internal MEDICINE  Telephone Visit  Patient Name: Allen [name]  [id number]  [id number]  Date of Service: 11/04/2020  I connected with the patient at 10:55 AM by telephone and verified the patients identity using two identifiers.  He is unable to connect via video due to technological difficulty. I discussed the limitations, risks, security and privacy concerns of performing an evaluation and management service by telephone and the availability of in person appointments. I also discussed with the patient that there may be a patient responsible charge related to the service.  The patient expressed understanding and agrees to proceed.    Chief Complaint  Patient presents with   Acute Visit   Telephone Assessment    Phone call    Telephone Screen    [phone]   Cough    SOB, congestion in lungs, mucus is dark brown, covid test yesterday neg. No fever    HPI Allen [name] presents for a telehealth virtual visit for upper respiratory infection. His wife was seen in the clinic last week for sinusitis and bronchitis. He is having SOB, cough, sore throat, chest tightness, body aches and fatigue.  He is having sputum production with cough and it is brownish in color. He denies fever, headaches, nausea, vomiting, or diarrhea. He denies any rash and denies ear pain.   Current Medication: Outpatient Encounter Medications as of 11/04/2020  Medication Sig   aspirin 81 MG chewable tablet Chew by mouth daily.   azithromycin (ZITHROMAX) 250 MG tablet Take one tab a day for 10 days for uri   b complex vitamins tablet Take 1 tablet by mouth daily.   B-D ULTRA-FINE 33 LANCETS MISC Use 1 Lancet 3 (three) times daily.   cetirizine (ZYRTEC) 10 MG tablet Take 10 mg by mouth daily.   Cholecalciferol (VITAMIN D3) 1000 units CAPS Take by mouth.   clotrimazole-betamethasone (LOTRISONE) cream Apply 1 application topically 2 (two) times daily.    Continuous Blood Gluc Sensor (FREESTYLE LIBRE SENSOR SYSTEM) MISC Use 3 each every 10 (ten) days.   ezetimibe-simvastatin (VYTORIN) 10-40 MG tablet TAKE 1 TABLET BY MOUTH DAILY   folic acid (FOLVITE) A999333 MCG tablet Take 400 mcg by mouth daily.   Garlic [id number] MG CAPS Take by mouth.   glipiZIDE (GLUCOTROL XL) 5 MG 24 hr tablet Take 1 tablet by mouth daily.   glucose blood test strip Check blood sugar 5x a day as directed   insulin lispro (HUMALOG) 100 UNIT/ML injection Inject 6-34 Units into the skin 3 (three) times daily before meals.    Insulin Pen Needle 32G X 4 MM MISC USE AS DIRECTED FOUR TIMES A DAY WITH LEVEMIR FLEXTOUCH AND HUMALOG PEN.  DX CODE E11.29   Insulin Syringe-Needle U-100 (BD INSULIN SYRINGE U/F) 31G X 5/16" 0.3 ML MISC Inject insulin [place] TID as indicated per sliding scale instuctions.   JARDIANCE 25 MG TABS tablet Take 0.5 mg by mouth daily.    levothyroxine (SYNTHROID, LEVOTHROID) 50 MCG tablet Take 50 mcg by mouth daily before breakfast.   metFORMIN (GLUCOPHAGE) 500 MG tablet Take 500 mg by mouth 2 (two) times daily with a meal.   metoprolol succinate (TOPROL-XL) 25 MG 24 hr tablet Take 1 tablet (25 mg total) by mouth daily.   Multiple Vitamin (MULTIVITAMIN WITH MINERALS) TABS tablet Take 1 tablet by mouth daily.   mupirocin ointment (BACTROBAN) 2 % Apply topically two times daily   nystatin ointment (MYCOSTATIN) Apply 1 application  topically 3 (three) times daily.   predniSONE (DELTASONE) 10 MG tablet Take one tab 3 x day for 3 days, then take one tab 2 x a day for 3 days and then take one tab a day for 3 days for URI   traMADol (ULTRAM) 50 MG tablet Take 100 mg by mouth daily.    triamcinolone cream (KENALOG) 0.1 % Apply 1 application topically 2 (two) times daily.   valsartan (DIOVAN) 320 MG tablet Take 1 tablet (320 mg total) by mouth daily.   vitamin B-12 (CYANOCOBALAMIN) 1000 MCG tablet Take 1,000 mcg by mouth daily.   Zoster Vaccine Adjuvanted [facility]) injection  Shingles vaccine series - inject IM as directed .   No facility-administered encounter medications on file as of 11/04/2020.    Surgical History: Past Surgical History:  Procedure Laterality Date   [place]   GALLBLADDER SURGERY     LEG SURGERY Right    stent placement    Medical History: Past Medical History:  Diagnosis Date   Coronary artery disease    a. 1989 s/p MI w/ PTCA to unknown vessel. Reports cath 6 months later with occluded vessel and collaterals--> medically managed since..   Diabetes mellitus without complication ([place])    Hyperlipidemia LDL goal <70    Hypertension    a. 02/2020 nl renal artery duplex.   MI (myocardial infarction) ([place])    PAD (peripheral artery disease) ([place])    a. 12/2019 ABIs: R 1.02, L 0.56-->unchanged.   Sleep apnea     Family History: Family History  Problem Relation Age of Onset   Diabetes Mother    Brain cancer Mother    Throat cancer Father    Prostate cancer Brother     Social History   Socioeconomic History   Marital status: Married    Spouse name: Not on file   Number of children: Not on file   Years of education: Not on file   Highest education level: Not on file  Occupational History   Not on file  Tobacco Use   Smoking status: Former    Packs/day: 3.00    Years: 32.00    Pack years: 96.00    Types: Cigarettes   Smokeless tobacco: Never  Vaping Use   Vaping Use: Never used  Substance and Sexual Activity   Alcohol use: No   Drug use: No   Sexual activity: Not on file  Other Topics Concern   Not on file  Social History Narrative   Not on file   Social Determinants of Health   Financial Resource Strain: Not on file  Food Insecurity: Not on file  Transportation Needs: Not on file  Physical Activity: Not on file  Stress: Not on file  Social Connections: Not on file  Intimate Partner Violence: Not on file      Review of Systems  Constitutional:   Positive for fatigue. Negative for chills and fever.  HENT:  Positive for congestion, postnasal drip, rhinorrhea, sinus pressure, sinus pain, sneezing and sore throat. Negative for ear pain.   Respiratory:  Positive for cough, chest tightness, shortness of breath and wheezing.   Cardiovascular:  Negative for chest pain and palpitations.  Gastrointestinal:  Negative for abdominal pain, diarrhea, nausea and vomiting.  Musculoskeletal:  Positive for myalgias.  Neurological:  Negative for dizziness, light-headedness and headaches.   Vital Signs: BP (!) 144/48   Pulse (!) 58   Temp (!) 97.1 F (  36.2 C)   Resp 16   Ht '5\' 7"'$  (1.702 m)   Wt 218 lb (98.9 kg)   BMI 34.14 kg/m    Observation/Objective: Allen [name] is alert and oriented, and engages in conversation appropriately. He does not sound like he is in acute distress over the phone.     Assessment/Plan: 1. Acute non-recurrent pansinusitis Empiric antibiotic treatment prescribed.  - azithromycin (ZITHROMAX) 250 MG tablet; Take one tab a day for 10 days for uri  Dispense: 10 tablet; Refill: 0  2. Acute bacterial bronchitis See above problem #1, also prednisone course prescribed for chest tightness and wheezing. - azithromycin (ZITHROMAX) 250 MG tablet; Take one tab a day for 10 days for uri  Dispense: 10 tablet; Refill: 0 - predniSONE (DELTASONE) 10 MG tablet; Take one tab 3 x day for 3 days, then take one tab 2 x a day for 3 days and then take one tab a day for 3 days for URI  Dispense: 18 tablet; Refill: 0  3. Wheezing See problem #2 - predniSONE (DELTASONE) 10 MG tablet; Take one tab 3 x day for 3 days, then take one tab 2 x a day for 3 days and then take one tab a day for 3 days for URI  Dispense: 18 tablet; Refill: 0   General Counseling: Allen [name] understanding of the findings of today's phone visit and agrees with plan of treatment. I have discussed any further diagnostic evaluation that may be needed or ordered today.  We also reviewed his medications today. he has been encouraged to call the office with any questions or concerns that should arise related to todays visit.  Return if symptoms worsen or fail to improve.   No orders of the defined types were placed in this encounter.   Meds ordered this encounter  Medications   azithromycin (ZITHROMAX) 250 MG tablet    Sig: Take one tab a day for 10 days for uri    Dispense:  10 tablet    Refill:  0   predniSONE (DELTASONE) 10 MG tablet    Sig: Take one tab 3 x day for 3 days, then take one tab 2 x a day for 3 days and then take one tab a day for 3 days for URI    Dispense:  18 tablet    Refill:  0    Time spent:15 Minutes Time spent with patient included reviewing progress notes, labs, imaging studies, and discussing plan for follow up.  [place] Controlled Substance Database was reviewed by me for overdose risk score (ORS) if appropriate.  This patient was seen by Jonetta Osgood, FNP-C in collaboration with Dr. Clayborn Bigness as a part of collaborative care agreement.  Britini Garcilazo R. Valetta Fuller, MSN, FNP-C Internal medicine

## 2020-11-04 NOTE — Telephone Encounter (Signed)
error 

## 2020-11-06 ENCOUNTER — Telehealth: Payer: Self-pay

## 2020-11-17 NOTE — Telephone Encounter (Signed)
error 

## 2020-11-26 ENCOUNTER — Ambulatory Visit: Payer: Medicare Other | Admitting: Dermatology

## 2020-12-01 ENCOUNTER — Ambulatory Visit: Payer: Medicare Other | Admitting: Physician Assistant

## 2020-12-04 ENCOUNTER — Ambulatory Visit (INDEPENDENT_AMBULATORY_CARE_PROVIDER_SITE_OTHER): Payer: Medicare Other | Admitting: Physician Assistant

## 2020-12-04 ENCOUNTER — Encounter: Payer: Self-pay | Admitting: Physician Assistant

## 2020-12-04 ENCOUNTER — Other Ambulatory Visit: Payer: Self-pay

## 2020-12-04 VITALS — BP 132/48 | HR 59 | Temp 98.2°F | Resp 16 | Ht 68.0 in | Wt 218.0 lb

## 2020-12-04 DIAGNOSIS — G4733 Obstructive sleep apnea (adult) (pediatric): Secondary | ICD-10-CM | POA: Diagnosis not present

## 2020-12-04 DIAGNOSIS — Z794 Long term (current) use of insulin: Secondary | ICD-10-CM

## 2020-12-04 DIAGNOSIS — I2510 Atherosclerotic heart disease of native coronary artery without angina pectoris: Secondary | ICD-10-CM | POA: Diagnosis not present

## 2020-12-04 DIAGNOSIS — I10 Essential (primary) hypertension: Secondary | ICD-10-CM

## 2020-12-04 DIAGNOSIS — R300 Dysuria: Secondary | ICD-10-CM

## 2020-12-04 DIAGNOSIS — R001 Bradycardia, unspecified: Secondary | ICD-10-CM | POA: Diagnosis not present

## 2020-12-04 DIAGNOSIS — Z0001 Encounter for general adult medical examination with abnormal findings: Secondary | ICD-10-CM

## 2020-12-04 DIAGNOSIS — Z23 Encounter for immunization: Secondary | ICD-10-CM | POA: Diagnosis not present

## 2020-12-04 DIAGNOSIS — E1159 Type 2 diabetes mellitus with other circulatory complications: Secondary | ICD-10-CM

## 2020-12-04 LAB — POCT GLYCOSYLATED HEMOGLOBIN (HGB A1C): Hemoglobin A1C: 7.6 % — AB (ref 4.0–5.6)

## 2020-12-04 NOTE — Progress Notes (Signed)
[facility] [place], [place] [address]  Internal MEDICINE  Office Visit Note  Patient Name: Allen [name]  [id number]  [id number]  Date of Service: 12/07/2020  Chief Complaint  Patient presents with   Medicare Wellness    Pt wants [place] to take over diabetes care      HPI Pt is here for routine health maintenance examination -BP in office is slightly lower than at home usually -Got a letter from endocrinology that provider was leaving and needed to change providers and this pushed him to want to move away from that office and have [facility]. States the [place] is going to check A1c and thinks they may be able to get him off the insulin -Previously tried injectable and oral meds -takes Jardiance, 1 glipizide (BID), metformin 1000 BID, novalog sliding scale twice per day. Also takes ~20units novalog in AM. No long acting. -Had foot exam at [facility] - [facility] yesterday -sees vascular regularly for poor circulation in lower extremities. -Does have scrape on on left medial malleolus will watch closely -flu shot earlier today, had covid booster the other day. -He is also followed by cardiology  Current Medication: Outpatient Encounter Medications as of 12/04/2020  Medication Sig   aspirin 81 MG chewable tablet Chew by mouth daily.   b complex vitamins tablet Take 1 tablet by mouth daily.   B-D ULTRA-FINE 33 LANCETS MISC Use 1 Lancet 3 (three) times daily.   cetirizine (ZYRTEC) 10 MG tablet Take 10 mg by mouth daily.   Cholecalciferol (VITAMIN D3) 1000 units CAPS Take by mouth.   Continuous Blood Gluc Sensor (FREESTYLE LIBRE SENSOR SYSTEM) MISC Use 3 each every 10 (ten) days.   ezetimibe-simvastatin (VYTORIN) 10-40 MG tablet TAKE 1 TABLET BY MOUTH DAILY   folic acid (FOLVITE) A999333 MCG tablet Take 400 mcg by mouth daily.   Garlic [id number] MG CAPS Take by mouth.   glipiZIDE (GLUCOTROL XL) 5 MG 24 hr tablet Take 1 tablet by mouth daily.   glucose blood test strip  Check blood sugar 5x a day as directed   insulin aspart (NOVOLOG) 100 UNIT/ML FlexPen INJECT 10 UNITS UNDER SKIN TWICE DAILY BEFORE MORNING AND EVENING MEAL FOR DIABETES.DISCARD PEN 28 DAYS AFTER OPENING PLUS SLIDING SCALE   Insulin Pen Needle 32G X 4 MM MISC USE AS DIRECTED FOUR TIMES A DAY WITH LEVEMIR FLEXTOUCH AND HUMALOG PEN.  DX CODE E11.29   Insulin Syringe-Needle U-100 (BD INSULIN SYRINGE U/F) 31G X 5/16" 0.3 ML MISC Inject insulin [place] TID as indicated per sliding scale instuctions.   JARDIANCE 25 MG TABS tablet Take 0.5 mg by mouth daily.    levothyroxine (SYNTHROID, LEVOTHROID) 50 MCG tablet Take 50 mcg by mouth daily before breakfast.   metFORMIN (GLUCOPHAGE) 500 MG tablet Take 500 mg by mouth 2 (two) times daily with a meal.   metoprolol succinate (TOPROL-XL) 25 MG 24 hr tablet Take 1 tablet (25 mg total) by mouth daily.   Multiple Vitamin (MULTIVITAMIN WITH MINERALS) TABS tablet Take 1 tablet by mouth daily.   nystatin ointment (MYCOSTATIN) Apply 1 application topically 3 (three) times daily.   traMADol (ULTRAM) 50 MG tablet Take 100 mg by mouth daily.    valsartan (DIOVAN) 320 MG tablet Take 1 tablet (320 mg total) by mouth daily.   vitamin B-12 (CYANOCOBALAMIN) 1000 MCG tablet Take 1,000 mcg by mouth daily.   [DISCONTINUED] azithromycin (ZITHROMAX) 250 MG tablet Take one tab a day for 10 days for  uri (Patient not taking: Reported on 12/04/2020)   [DISCONTINUED] clotrimazole-betamethasone (LOTRISONE) cream Apply 1 application topically 2 (two) times daily. (Patient not taking: Reported on 12/04/2020)   [DISCONTINUED] insulin lispro (HUMALOG) 100 UNIT/ML injection Inject 6-34 Units into the skin 3 (three) times daily before meals.  (Patient not taking: Reported on 12/04/2020)   [DISCONTINUED] mupirocin ointment (BACTROBAN) 2 % Apply topically two times daily (Patient not taking: Reported on 12/04/2020)   [DISCONTINUED] predniSONE (DELTASONE) 10 MG tablet Take one tab 3 x day for 3 days,  then take one tab 2 x a day for 3 days and then take one tab a day for 3 days for URI (Patient not taking: Reported on 12/04/2020)   [DISCONTINUED] triamcinolone cream (KENALOG) 0.1 % Apply 1 application topically 2 (two) times daily. (Patient not taking: Reported on 12/04/2020)   [DISCONTINUED] Zoster Vaccine Adjuvanted [facility]) injection Shingles vaccine series - inject IM as directed . (Patient not taking: Reported on 12/04/2020)   No facility-administered encounter medications on file as of 12/04/2020.    Surgical History: Past Surgical History:  Procedure Laterality Date   [place]   GALLBLADDER SURGERY     LEG SURGERY Right    stent placement    Medical History: Past Medical History:  Diagnosis Date   Coronary artery disease    a. 1989 s/p MI w/ PTCA to unknown vessel. Reports cath 6 months later with occluded vessel and collaterals--> medically managed since..   Diabetes mellitus without complication ([place])    Hyperlipidemia LDL goal <70    Hypertension    a. 02/2020 nl renal artery duplex.   MI (myocardial infarction) ([place])    PAD (peripheral artery disease) ([place])    a. 12/2019 ABIs: R 1.02, L 0.56-->unchanged.   Sleep apnea     Family History: Family History  Problem Relation Age of Onset   Diabetes Mother    Brain cancer Mother    Throat cancer Father    Prostate cancer Brother       Review of Systems  Constitutional:  Negative for chills, fatigue and unexpected weight change.  HENT:  Negative for congestion, postnasal drip, rhinorrhea, sneezing and sore throat.   Eyes:  Negative for redness.  Respiratory:  Negative for cough, chest tightness and shortness of breath.   Cardiovascular:  Negative for chest pain and palpitations.  Gastrointestinal:  Negative for abdominal pain, constipation, diarrhea, nausea and vomiting.  Genitourinary:  Negative for dysuria and frequency.  Musculoskeletal:  Negative for  arthralgias, back pain, joint swelling and neck pain.  Skin:  Positive for wound. Negative for rash.       Scrape on left ankle  Neurological:  Negative for tremors and numbness.  Hematological:  Negative for adenopathy. Does not bruise/bleed easily.  Psychiatric/Behavioral:  Negative for behavioral problems (Depression), sleep disturbance and suicidal ideas. The patient is not nervous/anxious.     Vital Signs: BP (!) 132/48   Pulse (!) 59   Temp 98.2 F (36.8 C)   Resp 16   Ht '5\' 8"'$  (1.727 m)   Wt 218 lb (98.9 kg)   SpO2 98%   BMI 33.15 kg/m    Physical Exam Vitals and nursing note reviewed.  Constitutional:      General: He is not in acute distress.    Appearance: He is well-developed. He is obese. He is not diaphoretic.  HENT:     Head: Normocephalic and atraumatic.  Mouth/Throat:     Pharynx: No oropharyngeal exudate.  Eyes:     Pupils: Pupils are equal, round, and reactive to light.  Neck:     Thyroid: No thyromegaly.     Vascular: No JVD.     Trachea: No tracheal deviation.  Cardiovascular:     Rate and Rhythm: Regular rhythm. Bradycardia present.     Heart sounds: Normal heart sounds. No murmur heard.   No friction rub. No gallop.  Pulmonary:     Effort: Pulmonary effort is normal. No respiratory distress.     Breath sounds: No wheezing or rales.  Chest:     Chest wall: No tenderness.  Abdominal:     General: Bowel sounds are normal.     Palpations: Abdomen is soft.  Musculoskeletal:        General: Normal range of motion.     Cervical back: Normal range of motion and neck supple.  Lymphadenopathy:     Cervical: No cervical adenopathy.  Skin:    General: Skin is warm and dry.     Comments: Scrape along left medial malleolus without drainage  Neurological:     Mental Status: He is alert and oriented to person, place, and time.     Cranial Nerves: No cranial nerve deficit.  Psychiatric:        Behavior: Behavior normal.        Thought Content:  Thought content normal.        Judgment: Judgment normal.     LABS: Recent Results (from the past 2160 hour(s))  UA/M w/rflx Culture, Routine     Status: Abnormal   Collection Time: 12/04/20  5:08 PM   Specimen: Urine   Urine  Result Value Ref Range   Specific Gravity, UA 1.026 1.005 - 1.030   pH, UA 6.5 5.0 - 7.5   Color, UA Yellow Yellow   Appearance Ur Clear Clear   Leukocytes,UA Negative Negative   Protein,UA Negative Negative/Trace   Glucose, UA 3+ (A) Negative   Ketones, UA Negative Negative   RBC, UA Negative Negative   Bilirubin, UA Negative Negative   Urobilinogen, Ur 0.2 0.2 - 1.0 mg/dL   Nitrite, UA Negative Negative   Microscopic Examination Comment     Comment: Microscopic follows if indicated.   Microscopic Examination See below:     Comment: Microscopic was indicated and was performed.   Urinalysis Reflex Comment     Comment: This specimen will not reflex to a Urine Culture.  Microscopic Examination     Status: None   Collection Time: 12/04/20  5:08 PM   Urine  Result Value Ref Range   WBC, UA None seen 0 - 5 /hpf   RBC None seen 0 - 2 /hpf   Epithelial Cells (non renal) None seen 0 - 10 /hpf   Casts None seen None seen /lpf   Bacteria, UA None seen None seen/Few  POCT HgB A1C     Status: Abnormal   Collection Time: 12/04/20  5:14 PM  Result Value Ref Range   Hemoglobin A1C 7.6 (A) 4.0 - 5.6 %   HbA1c POC (<> result, manual entry)     HbA1c, POC (prediabetic range)     HbA1c, POC (controlled diabetic range)          Assessment/Plan: 1. Encounter for general adult medical examination with abnormal findings CPE performed  2. Type 2 diabetes mellitus with other circulatory complication, with long-term current use of insulin (HCC) - POCT HgB  A1C 7.6 which is decreased from prior reading via endocrinology office.  We will continue current medications.  Patient plans to discuss with the [place] as well  3. Essential hypertension Stable, continue current  medications  4. Bradycardia Stable, asymptomatic.  Followed by cardiology  5. Obstructive sleep apnea Continue CPAP nightly  6. Coronary artery disease involving native coronary artery of native heart without angina pectoris Followed by cardiology  7. Dysuria - UA/M w/rflx Culture, Routine   General Counseling: ermal luddy understanding of the findings of todays visit and agrees with plan of treatment. I have discussed any further diagnostic evaluation that may be needed or ordered today. We also reviewed his medications today. he has been encouraged to call the office with any questions or concerns that should arise related to todays visit.    Counseling:    Orders Placed This Encounter  Procedures   Microscopic Examination   UA/M w/rflx Culture, Routine   POCT HgB A1C     No orders of the defined types were placed in this encounter.   This patient was seen by Drema Dallas, PA-C in collaboration with Dr. Clayborn Bigness as a part of collaborative care agreement.  Total time spent:40 Minutes  Time spent includes review of chart, medications, test results, and follow up plan with the patient.     Lavera Guise, MD  Internal Medicine

## 2020-12-05 LAB — UA/M W/RFLX CULTURE, ROUTINE
Bilirubin, UA: NEGATIVE
Ketones, UA: NEGATIVE
Leukocytes,UA: NEGATIVE
Nitrite, UA: NEGATIVE
Protein,UA: NEGATIVE
RBC, UA: NEGATIVE
Specific Gravity, UA: 1.026 (ref 1.005–1.030)
Urobilinogen, Ur: 0.2 mg/dL (ref 0.2–1.0)
pH, UA: 6.5 (ref 5.0–7.5)

## 2020-12-05 LAB — MICROSCOPIC EXAMINATION
Bacteria, UA: NONE SEEN
Casts: NONE SEEN /lpf
Epithelial Cells (non renal): NONE SEEN /hpf (ref 0–10)
RBC, Urine: NONE SEEN /hpf (ref 0–2)
WBC, UA: NONE SEEN /hpf (ref 0–5)

## 2020-12-30 ENCOUNTER — Other Ambulatory Visit: Payer: Self-pay

## 2020-12-30 ENCOUNTER — Other Ambulatory Visit: Payer: Self-pay | Admitting: Physician Assistant

## 2020-12-30 DIAGNOSIS — Z794 Long term (current) use of insulin: Secondary | ICD-10-CM

## 2020-12-30 DIAGNOSIS — E1159 Type 2 diabetes mellitus with other circulatory complications: Secondary | ICD-10-CM

## 2020-12-30 MED ORDER — GLIPIZIDE ER 5 MG PO TB24: 5.0000 mg | ORAL_TABLET | Freq: Two times a day (BID) | ORAL | 2 refills | Status: DC

## 2020-12-30 MED ORDER — METFORMIN HCL 500 MG PO TABS: 500.0000 mg | ORAL_TABLET | Freq: Two times a day (BID) | ORAL | 1 refills | Status: DC

## 2020-12-31 NOTE — Progress Notes (Signed)
MRN : [id number]  Allen [name] is a 80 y.o. ([birth year]) male who presents with chief complaint of check circulation.  History of Present Illness:   The patient returns to the office for followup and review of the noninvasive studies. There have been no interval changes in lower extremity symptoms. No interval shortening of the patient's claudication distance or development of rest pain symptoms. No new ulcers or wounds have occurred since the last visit.   There have been no significant changes to the patient's overall health care.   The patient denies amaurosis fugax or recent TIA symptoms. There are no recent neurological changes noted. The patient denies history of DVT, PE or superficial thrombophlebitis. The patient denies recent episodes of angina or shortness of breath.    ABI Rt=1.20 and Lt=1.05  (previous ABI's Rt=1.02 and Lt=0.56)  No outpatient medications have been marked as taking for the 01/01/21 encounter (Appointment) with Delana Meyer, Dolores Lory, MD.    Past Medical History:  Diagnosis Date   Coronary artery disease    a. 1989 s/p MI w/ PTCA to unknown vessel. Reports cath 6 months later with occluded vessel and collaterals--> medically managed since..   Diabetes mellitus without complication ([place])    Hyperlipidemia LDL goal <70    Hypertension    a. 02/2020 nl renal artery duplex.   MI (myocardial infarction) ([place])    PAD (peripheral artery disease) ([place])    a. 12/2019 ABIs: R 1.02, L 0.56-->unchanged.   Sleep apnea     Past Surgical History:  Procedure Laterality Date   CARDIAC CATHETERIZATION  1989   CARDIAC CATHETERIZATION  1990   GALLBLADDER SURGERY     LEG SURGERY Right    stent placement    Social History Social History   Tobacco Use   Smoking status: Former    Packs/day: 3.00    Years: 32.00    Pack years: 96.00    Types: Cigarettes   Smokeless tobacco: Never  Vaping Use   Vaping Use: Never used  Substance Use Topics   Alcohol use: No   Drug  use: No    Family History Family History  Problem Relation Age of Onset   Diabetes Mother    Brain cancer Mother    Throat cancer Father    Prostate cancer Brother     Allergies  Allergen Reactions   Iodinated Diagnostic Agents Anaphylaxis and Rash    TOLERATED CT CONTRAST ON 01/07/2018 WITH NO ISSUES AFTER 10 MINUTE PRETREATMENT X-Ray contrast dyes- anaphylaxis & rash X-Ray contrast dyes- anaphylaxis & rash  X-Ray contrast dyes- anaphylaxis & rash TOLERATED CT CONTRAST ON 01/07/2018 WITH NO ISSUES AFTER 10 MINUTE PRETREATMENT X-Ray contrast dyes- anaphylaxis & rash X-Ray contrast dyes- anaphylaxis & rash   Iohexol Hives and Shortness Of Breath    Pt states that he had IV dye for a kidney x-ray years ago and after injection of the dye he had hives all over his body, throat swelling, and SOB. Pt states that he had IV dye for a kidney x-ray years ago and after injection of the dye he had hives all over his body, throat swelling, and SOB. Pt states that he had IV dye for a kidney x-ray years ago and after injection of the dye he had hives all over his body, throat swelling, and SOB.   Duloxetine Other (See Comments)    Sick to stomach and chills   Gabapentin Other (See Comments)   Penicillins Other (See Comments)  Other Rash     REVIEW OF SYSTEMS (Negative unless checked)  Constitutional: [] Weight loss  [] Fever  [] Chills Cardiac: [] Chest pain   [] Chest pressure   [] Palpitations   [] Shortness of breath when laying flat   [] Shortness of breath with exertion. Vascular:  [x] Pain in legs with walking   [] Pain in legs at rest  [] History of DVT   [] Phlebitis   [] Swelling in legs   [] Varicose veins   [] Non-healing ulcers Pulmonary:   [] Uses home oxygen   [] Productive cough   [] Hemoptysis   [] Wheeze  [] COPD   [] Asthma Neurologic:  [] Dizziness   [] Seizures   [] History of stroke   [] History of TIA  [] Aphasia   [] Vissual changes   [] Weakness or numbness in arm   [] Weakness or numbness in  leg Musculoskeletal:   [] Joint swelling   [] Joint pain   [] Low back pain Hematologic:  [] Easy bruising  [] Easy bleeding   [] Hypercoagulable state   [] Anemic Gastrointestinal:  [] Diarrhea   [] Vomiting  [] Gastroesophageal reflux/heartburn   [] Difficulty swallowing. Genitourinary:  [] Chronic kidney disease   [] Difficult urination  [] Frequent urination   [] Blood in urine Skin:  [] Rashes   [] Ulcers  Psychological:  [] History of anxiety   []  History of major depression.  Physical Examination  There were no vitals filed for this visit. There is no height or weight on file to calculate BMI. Gen: WD/WN, NAD Head: [place]/AT, No temporalis wasting.  Ear/Nose/Throat: Hearing grossly intact, nares w/o erythema or drainage Eyes: PER, EOMI, sclera nonicteric.  Neck: Supple, no masses.  No bruit or JVD.  Pulmonary:  Good air movement, no audible wheezing, no use of accessory muscles.  Cardiac: RRR, normal S1, S2, no Murmurs. Vascular:   Trace edema Vessel Right Left  Radial Palpable Palpable  PT Palpable Not Palpable  DP Palpable Not Palpable  Gastrointestinal: soft, non-distended. No guarding/no peritoneal signs.  Musculoskeletal: M/S 5/5 throughout.  No visible deformity.  Neurologic: CN 2-12 intact. Pain and light touch intact in extremities.  Symmetrical.  Speech is fluent. Motor exam as listed above. Psychiatric: Judgment intact, Mood & affect appropriate for pt's clinical situation. Dermatologic: No rashes or ulcers noted.  No changes consistent with cellulitis.   CBC Lab Results  Component Value Date   WBC 5.9 07/15/2020   HGB 13.2 07/15/2020   HCT 41.4 07/15/2020   MCV 98.1 07/15/2020   PLT 250 07/15/2020    BMET    Component Value Date/Time   NA 137 07/24/2020 1325   NA 139 02/18/2020 0846   NA 132 (L) 10/04/2011 0756   K 4.3 07/24/2020 1325   K 4.3 10/04/2011 0756   CL 106 07/24/2020 1325   CL 95 (L) 10/04/2011 0756   CO2 23 07/24/2020 1325   CO2 29 10/04/2011 0756    GLUCOSE 96 07/24/2020 1325   GLUCOSE 182 (H) 10/04/2011 0756   BUN 27 (H) 07/24/2020 1325   BUN 21 02/18/2020 0846   BUN 23 (H) 10/04/2011 0756   CREATININE 1.16 07/24/2020 1325   CREATININE 0.97 10/04/2011 0756   CALCIUM 9.3 07/24/2020 1325   CALCIUM 9.5 10/04/2011 0756   GFRNONAA >60 07/24/2020 1325   GFRAA 59 (L) 02/18/2020 0846   CrCl cannot be calculated (Patient's most recent lab result is older than the maximum 21 days allowed.).  COAG No results found for: INR, PROTIME  Radiology No results found.   Assessment/Plan 1. PAD (peripheral artery disease) (HCC)  Recommend:  The patient has evidence of atherosclerosis of the  lower extremities with claudication.  The patient does not voice lifestyle limiting changes at this point in time.  Noninvasive studies do not suggest clinically significant change.  No invasive studies, angiography or surgery at this time The patient should continue walking and begin a more formal exercise program.  The patient should continue antiplatelet therapy and aggressive treatment of the lipid abnormalities  No changes in the patient's medications at this time  The patient should continue wearing graduated compression socks 10-15 mmHg strength to control the mild edema.    - VAS [place] ABI WITH/WO TBI; Future  2. Essential hypertension Continue antihypertensive medications as already ordered, these medications have been reviewed and there are no changes at this time.   3. Type 2 diabetes mellitus with hyperglycemia, without long-term current use of insulin (HCC) Continue hypoglycemic medications as already ordered, these medications have been reviewed and there are no changes at this time.  Hgb A1C to be monitored as already arranged by primary service   4. Hyperlipidemia, unspecified hyperlipidemia type Continue statin as ordered and reviewed, no changes at this time     Hortencia Pilar, MD  12/31/2020 1:49 PM

## 2021-01-01 ENCOUNTER — Ambulatory Visit (INDEPENDENT_AMBULATORY_CARE_PROVIDER_SITE_OTHER): Payer: Medicare Other | Admitting: Vascular Surgery

## 2021-01-01 ENCOUNTER — Encounter (INDEPENDENT_AMBULATORY_CARE_PROVIDER_SITE_OTHER): Payer: Self-pay | Admitting: Vascular Surgery

## 2021-01-01 ENCOUNTER — Ambulatory Visit (INDEPENDENT_AMBULATORY_CARE_PROVIDER_SITE_OTHER): Payer: Medicare Other

## 2021-01-01 ENCOUNTER — Other Ambulatory Visit: Payer: Self-pay

## 2021-01-01 VITALS — BP 148/57 | HR 56 | Resp 16 | Wt 218.6 lb

## 2021-01-01 DIAGNOSIS — I739 Peripheral vascular disease, unspecified: Secondary | ICD-10-CM | POA: Diagnosis not present

## 2021-01-01 DIAGNOSIS — E785 Hyperlipidemia, unspecified: Secondary | ICD-10-CM | POA: Diagnosis not present

## 2021-01-01 DIAGNOSIS — I2510 Atherosclerotic heart disease of native coronary artery without angina pectoris: Secondary | ICD-10-CM | POA: Diagnosis not present

## 2021-01-01 DIAGNOSIS — E1165 Type 2 diabetes mellitus with hyperglycemia: Secondary | ICD-10-CM

## 2021-01-01 DIAGNOSIS — I10 Essential (primary) hypertension: Secondary | ICD-10-CM

## 2021-01-04 ENCOUNTER — Encounter (INDEPENDENT_AMBULATORY_CARE_PROVIDER_SITE_OTHER): Payer: Self-pay | Admitting: Vascular Surgery

## 2021-01-14 DIAGNOSIS — M5136 Other intervertebral disc degeneration, lumbar region: Secondary | ICD-10-CM | POA: Diagnosis not present

## 2021-01-14 DIAGNOSIS — M48062 Spinal stenosis, lumbar region with neurogenic claudication: Secondary | ICD-10-CM | POA: Diagnosis not present

## 2021-01-14 DIAGNOSIS — M5416 Radiculopathy, lumbar region: Secondary | ICD-10-CM | POA: Diagnosis not present

## 2021-01-20 ENCOUNTER — Ambulatory Visit (INDEPENDENT_AMBULATORY_CARE_PROVIDER_SITE_OTHER): Payer: Medicare Other | Admitting: Internal Medicine

## 2021-01-20 ENCOUNTER — Encounter: Payer: Self-pay | Admitting: Internal Medicine

## 2021-01-20 ENCOUNTER — Other Ambulatory Visit: Payer: Self-pay

## 2021-01-20 ENCOUNTER — Telehealth: Payer: Self-pay

## 2021-01-20 VITALS — BP 136/50 | HR 60 | Temp 98.3°F | Resp 16 | Ht 68.0 in | Wt 219.8 lb

## 2021-01-20 DIAGNOSIS — I10 Essential (primary) hypertension: Secondary | ICD-10-CM | POA: Diagnosis not present

## 2021-01-20 DIAGNOSIS — Z794 Long term (current) use of insulin: Secondary | ICD-10-CM

## 2021-01-20 DIAGNOSIS — R5383 Other fatigue: Secondary | ICD-10-CM

## 2021-01-20 DIAGNOSIS — E1159 Type 2 diabetes mellitus with other circulatory complications: Secondary | ICD-10-CM

## 2021-01-20 DIAGNOSIS — Z9989 Dependence on other enabling machines and devices: Secondary | ICD-10-CM

## 2021-01-20 DIAGNOSIS — G4733 Obstructive sleep apnea (adult) (pediatric): Secondary | ICD-10-CM | POA: Diagnosis not present

## 2021-01-20 NOTE — Progress Notes (Signed)
[facility] [place], [place] [id number]  Internal MEDICINE  Office Visit Note  Patient Name: Allen [name]  [id number]  [id number]  Date of Service: 02/19/2021  Chief Complaint  Patient presents with   Follow-up   Sleep Apnea    HPI  Patient is here for routine pulmonary follow-up has history of sleep apnea and on CPAP therapy. He is compliant with his CPAP. He is concerned about his weight, has gained weight since he has been on insulin, endocrinologist left town, will like to see a new endocrinologist  Needs labs updated    Current Medication: Outpatient Encounter Medications as of 01/20/2021  Medication Sig   aspirin 81 MG chewable tablet Chew by mouth daily.   b complex vitamins tablet Take 1 tablet by mouth daily.   B-D ULTRA-FINE 33 LANCETS MISC Use 1 Lancet 3 (three) times daily.   cetirizine (ZYRTEC) 10 MG tablet Take 10 mg by mouth daily.   Cholecalciferol (VITAMIN D3) 1000 units CAPS Take by mouth.   Continuous Blood Gluc Sensor (FREESTYLE LIBRE 14 DAY SENSOR) MISC Use 1 kit every 14 (fourteen) days   Continuous Blood Gluc Sensor (FREESTYLE LIBRE SENSOR SYSTEM) MISC Use 3 each every 10 (ten) days.   folic acid (FOLVITE) 419 MCG tablet Take 400 mcg by mouth daily.   Garlic 6222 MG CAPS Take by mouth.   glipiZIDE (GLUCOTROL XL) 5 MG 24 hr tablet Take 1 tablet (5 mg total) by mouth in the morning and at bedtime.   glucose blood test strip Check blood sugar 5x a day as directed   insulin aspart (NOVOLOG) 100 UNIT/ML FlexPen INJECT 10 UNITS UNDER SKIN TWICE DAILY BEFORE MORNING AND EVENING MEAL FOR DIABETES.DISCARD PEN 28 DAYS AFTER OPENING PLUS SLIDING SCALE   Insulin Pen Needle 32G X 4 MM MISC USE AS DIRECTED FOUR TIMES A DAY WITH LEVEMIR FLEXTOUCH AND HUMALOG PEN.  DX CODE E11.29   Insulin Syringe-Needle U-100 (BD INSULIN SYRINGE U/F) 31G X 5/16" 0.3 ML MISC Inject insulin [place] TID as indicated per sliding scale instuctions.   JARDIANCE 25 MG TABS  tablet Take 0.5 mg by mouth daily.    levothyroxine (SYNTHROID, LEVOTHROID) 50 MCG tablet Take 50 mcg by mouth daily before breakfast.   metFORMIN (GLUCOPHAGE) 500 MG tablet Take 1 tablet (500 mg total) by mouth 2 (two) times daily with a meal. (Patient taking differently: Take 500 mg by mouth 2 (two) times daily with a meal. Pt taking 2 tablets (1000 mg) twice a day (2000 mg total))   metoprolol succinate (TOPROL-XL) 25 MG 24 hr tablet Take 1 tablet (25 mg total) by mouth daily.   Multiple Vitamin (MULTIVITAMIN WITH MINERALS) TABS tablet Take 1 tablet by mouth daily.   nystatin ointment (MYCOSTATIN) Apply 1 application topically 3 (three) times daily.   traMADol (ULTRAM) 50 MG tablet Take 100 mg by mouth daily.    valsartan (DIOVAN) 320 MG tablet Take 1 tablet (320 mg total) by mouth daily.   vitamin B-12 (CYANOCOBALAMIN) 1000 MCG tablet Take 1,000 mcg by mouth daily.   [DISCONTINUED] ezetimibe-simvastatin (VYTORIN) 10-40 MG tablet TAKE 1 TABLET BY MOUTH DAILY   [DISCONTINUED] clotrimazole-betamethasone (LOTRISONE) cream Apply 1 application topically 2 (two) times daily. (Patient not taking: Reported on 12/04/2020)   [DISCONTINUED] enalapril (VASOTEC) 20 MG tablet Take 1 tablet (20 mg total) by mouth daily.   [DISCONTINUED] ezetimibe-simvastatin (VYTORIN) 10-40 MG tablet Take 1 tablet by mouth daily.   [DISCONTINUED] glipiZIDE (GLUCOTROL XL) 5 MG  24 hr tablet Take 1 tablet by mouth daily.   [DISCONTINUED] hydrochlorothiazide (MICROZIDE) 12.5 MG capsule Take 12.5 mg by mouth daily.   [DISCONTINUED] insulin lispro (HUMALOG) 100 UNIT/ML injection Inject 6-34 Units into the skin 3 (three) times daily before meals.  (Patient not taking: Reported on 12/04/2020)   [DISCONTINUED] metFORMIN (GLUCOPHAGE) 1000 MG tablet Take 1,000 mg by mouth daily with breakfast.  (Patient not taking: Reported on 07/17/2020)   [DISCONTINUED] metFORMIN (GLUCOPHAGE) 500 MG tablet Take 1,000 mg by mouth daily with breakfast.    [DISCONTINUED] metoprolol succinate (TOPROL-XL) 25 MG 24 hr tablet Take 1 tablet (25 mg total) by mouth daily.   [DISCONTINUED] mupirocin ointment (BACTROBAN) 2 % Apply topically two times daily (Patient not taking: Reported on 12/04/2020)   [DISCONTINUED] pneumococcal 23 valent vaccine (PNEUMOVAX 23) 25 MCG/0.5ML injection Inject 0.9m IM once   [DISCONTINUED] triamcinolone cream (KENALOG) 0.1 % Apply 1 application topically 2 (two) times daily. (Patient not taking: Reported on 12/04/2020)   [DISCONTINUED] Zoster Vaccine Adjuvanted ([facility] injection Shingles vaccine series - inject IM as directed . (Patient not taking: Reported on 12/04/2020)   No facility-administered encounter medications on file as of 01/20/2021.    Surgical History: Past Surgical History:  Procedure Laterality Date   CCamp [place]  GALLBLADDER SURGERY     LEG SURGERY Right    stent placement    Medical History: Past Medical History:  Diagnosis Date   Coronary artery disease    a. 1989 s/p MI w/ PTCA to unknown vessel. Reports cath 6 months later with occluded vessel and collaterals--> medically managed since..   [place]    Hyperlipidemia LDL goal <70    Hypertension    a. 02/2020 nl renal artery duplex.   MI (myocardial infarction) ([place]    PAD (peripheral artery disease) ([place]    a. 12/2019 ABIs: R 1.02, L 0.56-->unchanged.   Sleep apnea     Family History: Family History  Problem Relation Age of Onset   Diabetes Mother    Brain cancer Mother    Throat cancer Father    Prostate cancer Brother     Social History   Socioeconomic History   Marital status: Married    Spouse name: Not on file   Number of children: Not on file   Years of education: Not on file   Highest education level: Not on file  Occupational History   Not on file  Tobacco Use   Smoking status: Former    Packs/day: 3.00    Years: 32.00     Pack years: 96.00    Types: Cigarettes   Smokeless tobacco: Never  Vaping Use   Vaping Use: Never used  Substance and Sexual Activity   Alcohol use: No   Drug use: No   Sexual activity: Not on file  Other Topics Concern   Not on file  Social History Narrative   Not on file   Social Determinants of Health   Financial Resource Strain: Not on file  Food Insecurity: Not on file  Transportation Needs: Not on file  Physical Activity: Not on file  Stress: Not on file  Social Connections: Not on file  Intimate Partner Violence: Not on file      Review of Systems  Constitutional:  Negative for chills, fatigue and unexpected weight change.  HENT:  Negative for congestion, postnasal drip, rhinorrhea, sneezing and sore throat.   Eyes:  Negative  for redness.  Respiratory:  Negative for cough, chest tightness and shortness of breath.   Cardiovascular:  Negative for chest pain and palpitations.  Gastrointestinal:  Negative for abdominal pain, constipation, diarrhea, nausea and vomiting.  Genitourinary:  Negative for dysuria and frequency.  Musculoskeletal:  Negative for arthralgias, back pain, joint swelling and neck pain.  Skin:  Negative for rash.  Neurological: Negative.  Negative for tremors and numbness.  Hematological:  Negative for adenopathy. Does not bruise/bleed easily.  Psychiatric/Behavioral:  Negative for behavioral problems (Depression), sleep disturbance and suicidal ideas. The patient is not nervous/anxious.    Vital Signs: BP (!) 136/50 Comment: 145/54  Pulse 60   Temp 98.3 F (36.8 C)   Resp 16   Ht _0  (1.727 m)   Wt 219 lb 12.8 oz (99.7 kg)   SpO2 99%   BMI 33.42 kg/m    Physical Exam Constitutional:      Appearance: Normal appearance.  HENT:     Head: Normocephalic and atraumatic.     Nose: Nose normal.     Mouth/Throat:     Mouth: Mucous membranes are moist.     Pharynx: No posterior oropharyngeal erythema.  Eyes:     Extraocular Movements:  Extraocular movements intact.     Pupils: Pupils are equal, round, and reactive to light.  Cardiovascular:     Pulses: Normal pulses.     Heart sounds: Normal heart sounds.  Pulmonary:     Effort: Pulmonary effort is normal.     Breath sounds: Normal breath sounds.  Neurological:     General: No focal deficit present.     Mental Status: He is alert.  Psychiatric:        Mood and Affect: Mood normal.        Behavior: Behavior normal.       Assessment/Plan: 1. OSA on CPAP Continue CPAP as before  - TSH + free T4  2. Type 2 diabetes mellitus with other circulatory complication, with long-term current use of insulin (HCC) Pt will need modification in therapy  - Basic Metabolic Panel (BMET) - CBC with Differential/Platelet  3. Essential hypertension Will modify therapy  - Basic Metabolic Panel (BMET) - CBC with Differential/Platelet   General Counseling: shalon councilman understanding of the findings of todays visit and agrees with plan of treatment. I have discussed any further diagnostic evaluation that may be needed or ordered today. We also reviewed his medications today. he has been encouraged to call the office with any questions or concerns that should arise related to todays visit.    Orders Placed This Encounter  Procedures   Basic Metabolic Panel (BMET)   CBC with Differential/Platelet   TSH + free T4    No orders of the defined types were placed in this encounter.   Total time spent:35 Minutes Time spent includes review of chart, medications, test results, and follow up plan with the patient.   [place] Controlled Substance Database was reviewed by me.   Dr Lavera Guise Internal medicine

## 2021-01-20 NOTE — Telephone Encounter (Signed)
Dr. Latricia Heft saw pt, he told her that there is a leak on the right side of his mask for cpap and it blows air into his right eye. Spoke to [place] and she will reach out to pt.

## 2021-01-30 ENCOUNTER — Other Ambulatory Visit: Payer: Self-pay

## 2021-01-30 MED ORDER — EZETIMIBE-SIMVASTATIN 10-40 MG PO TABS: 1.0000 | ORAL_TABLET | Freq: Every day | ORAL | 1 refills | Status: DC

## 2021-02-17 DIAGNOSIS — Z20822 Contact with and (suspected) exposure to covid-19: Secondary | ICD-10-CM | POA: Diagnosis not present

## 2021-02-17 DIAGNOSIS — Z03818 Encounter for observation for suspected exposure to other biological agents ruled out: Secondary | ICD-10-CM | POA: Diagnosis not present

## 2021-02-19 ENCOUNTER — Telehealth: Payer: Self-pay

## 2021-02-19 NOTE — Telephone Encounter (Signed)
Spoke to pt and informed him per DFK to have labs done before his next office visit which is on 02/24/21.  Pt advised he will try to get the labs done on 02/20/21

## 2021-02-19 NOTE — Telephone Encounter (Signed)
-----   Message from Lavera Guise, MD sent at 02/19/2021  3:24 PM EST ----- Can u remind pt to have his labs done before next follow up

## 2021-02-20 DIAGNOSIS — D509 Iron deficiency anemia, unspecified: Secondary | ICD-10-CM | POA: Diagnosis not present

## 2021-02-20 DIAGNOSIS — Z794 Long term (current) use of insulin: Secondary | ICD-10-CM | POA: Diagnosis not present

## 2021-02-20 DIAGNOSIS — I10 Essential (primary) hypertension: Secondary | ICD-10-CM | POA: Diagnosis not present

## 2021-02-20 DIAGNOSIS — E1159 Type 2 diabetes mellitus with other circulatory complications: Secondary | ICD-10-CM | POA: Diagnosis not present

## 2021-02-20 DIAGNOSIS — E538 Deficiency of other specified B group vitamins: Secondary | ICD-10-CM | POA: Diagnosis not present

## 2021-02-20 DIAGNOSIS — R5383 Other fatigue: Secondary | ICD-10-CM | POA: Diagnosis not present

## 2021-02-21 LAB — CBC WITH DIFFERENTIAL/PLATELET
Basophils Absolute: 0 10*3/uL (ref 0.0–0.2)
Basos: 1 %
EOS (ABSOLUTE): 0.2 10*3/uL (ref 0.0–0.4)
Eos: 4 %
Hematocrit: 42.8 % (ref 37.5–51.0)
Hemoglobin: 13.8 g/dL (ref 13.0–17.7)
Immature Grans (Abs): 0 10*3/uL (ref 0.0–0.1)
Immature Granulocytes: 0 %
Lymphocytes Absolute: 1.6 10*3/uL (ref 0.7–3.1)
Lymphs: 31 %
MCH: 32.2 pg (ref 26.6–33.0)
MCHC: 32.2 g/dL (ref 31.5–35.7)
MCV: 100 fL — ABNORMAL HIGH (ref 79–97)
Monocytes Absolute: 0.5 10*3/uL (ref 0.1–0.9)
Monocytes: 10 %
Neutrophils Absolute: 2.7 10*3/uL (ref 1.4–7.0)
Neutrophils: 54 %
Platelets: 240 10*3/uL (ref 150–450)
RBC: 4.28 x10E6/uL (ref 4.14–5.80)
RDW: 12.2 % (ref 11.6–15.4)
WBC: 5 10*3/uL (ref 3.4–10.8)

## 2021-02-21 LAB — TSH+FREE T4
Free T4: 1.42 ng/dL (ref 0.82–1.77)
TSH: 5.53 u[IU]/mL — ABNORMAL HIGH (ref 0.450–4.500)

## 2021-02-21 LAB — BASIC METABOLIC PANEL
BUN/Creatinine Ratio: 18 (ref 10–24)
BUN: 24 mg/dL (ref 8–27)
CO2: 25 mmol/L (ref 20–29)
Calcium: 9.7 mg/dL (ref 8.6–10.2)
Chloride: 102 mmol/L (ref 96–106)
Creatinine, Ser: 1.34 mg/dL — ABNORMAL HIGH (ref 0.76–1.27)
Glucose: 216 mg/dL — ABNORMAL HIGH (ref 70–99)
Potassium: 5.3 mmol/L — ABNORMAL HIGH (ref 3.5–5.2)
Sodium: 140 mmol/L (ref 134–144)
eGFR: 54 mL/min/{1.73_m2} — ABNORMAL LOW (ref >59)

## 2021-02-23 ENCOUNTER — Telehealth: Payer: Self-pay

## 2021-02-23 NOTE — Telephone Encounter (Signed)
Called [facility] and added on [id number] and folic acid, ferrtin and tibc, irn   per DFK

## 2021-02-23 NOTE — Telephone Encounter (Signed)
-----   Message from Lavera Guise, MD sent at 02/21/2021  3:44 PM EST ----- Add on [id number] and folic acid, ferrtin and tibc, irn

## 2021-02-24 ENCOUNTER — Ambulatory Visit (INDEPENDENT_AMBULATORY_CARE_PROVIDER_SITE_OTHER): Payer: Medicare Other | Admitting: Internal Medicine

## 2021-02-24 ENCOUNTER — Encounter: Payer: Self-pay | Admitting: Internal Medicine

## 2021-02-24 ENCOUNTER — Other Ambulatory Visit: Payer: Self-pay

## 2021-02-24 VITALS — BP 153/49 | HR 59 | Temp 98.0°F | Resp 16 | Ht 68.0 in | Wt 220.6 lb

## 2021-02-24 DIAGNOSIS — I10 Essential (primary) hypertension: Secondary | ICD-10-CM

## 2021-02-24 DIAGNOSIS — E039 Hypothyroidism, unspecified: Secondary | ICD-10-CM

## 2021-02-24 DIAGNOSIS — Z794 Long term (current) use of insulin: Secondary | ICD-10-CM | POA: Diagnosis not present

## 2021-02-24 DIAGNOSIS — N1831 Chronic kidney disease, stage 3a: Secondary | ICD-10-CM | POA: Diagnosis not present

## 2021-02-24 DIAGNOSIS — Z6833 Body mass index (BMI) 33.0-33.9, adult: Secondary | ICD-10-CM

## 2021-02-24 DIAGNOSIS — E1122 Type 2 diabetes mellitus with diabetic chronic kidney disease: Secondary | ICD-10-CM

## 2021-02-24 LAB — POCT GLYCOSYLATED HEMOGLOBIN (HGB A1C): Hemoglobin A1C: 7.6 % — AB (ref 4.0–5.6)

## 2021-02-24 LAB — FERRITIN: Ferritin: 20 ng/mL — ABNORMAL LOW (ref 30–400)

## 2021-02-24 LAB — IRON AND TIBC
Iron Saturation: 18 % (ref 15–55)
Iron: 67 ug/dL (ref 38–169)
Total Iron Binding Capacity: 363 ug/dL (ref 250–450)
UIBC: 296 ug/dL (ref 111–343)

## 2021-02-24 LAB — B12 AND FOLATE PANEL
Folate: >20 ng/mL (ref >3.0)
Vitamin B-12: 773 pg/mL (ref 232–1245)

## 2021-02-24 LAB — SPECIMEN STATUS REPORT

## 2021-02-24 MED ORDER — LEVOTHYROXINE SODIUM 75 MCG PO TABS: 75.0000 ug | ORAL_TABLET | Freq: Every day | ORAL | 3 refills | Status: DC

## 2021-02-24 NOTE — Progress Notes (Signed)
[facility] [place], [place] [address]  Internal MEDICINE  Office Visit Note  Patient Name: Allen [name]  [id number]  [id number]  Date of Service: 02/26/2021  Chief Complaint  Patient presents with   Follow-up   Diabetes   Hyperlipidemia   Hypertension   Medication Problem    Pt says Metformin dose is incorrect, should be 2 pills twice daily instead of one pill twice daily    HPI  Pt is here for routine follow up Recent labs did show elevated cr. Pt is on metformin  Diabetes is not well controlled either  TSH is elevated    Current Medication: Outpatient Encounter Medications as of 02/24/2021  Medication Sig   aspirin 81 MG chewable tablet Chew by mouth daily.   b complex vitamins tablet Take 1 tablet by mouth daily.   B-D ULTRA-FINE 33 LANCETS MISC Use 1 Lancet 3 (three) times daily.   cetirizine (ZYRTEC) 10 MG tablet Take 10 mg by mouth daily.   Cholecalciferol (VITAMIN D3) 1000 units CAPS Take by mouth.   Continuous Blood Gluc Sensor (FREESTYLE LIBRE 14 DAY SENSOR) MISC Use 1 kit every 14 (fourteen) days   Continuous Blood Gluc Sensor (FREESTYLE LIBRE SENSOR SYSTEM) MISC Use 3 each every 10 (ten) days.   ezetimibe-simvastatin (VYTORIN) 10-40 MG tablet Take 1 tablet by mouth daily.   folic acid (FOLVITE) 975 MCG tablet Take 400 mcg by mouth daily.   Garlic 8832 MG CAPS Take by mouth.   glipiZIDE (GLUCOTROL XL) 5 MG 24 hr tablet Take 1 tablet (5 mg total) by mouth in the morning and at bedtime.   glucose blood test strip Check blood sugar 5x a day as directed   insulin aspart (NOVOLOG) 100 UNIT/ML FlexPen INJECT 10 UNITS UNDER SKIN TWICE DAILY BEFORE MORNING AND EVENING MEAL FOR DIABETES.DISCARD PEN 28 DAYS AFTER OPENING PLUS SLIDING SCALE   Insulin Pen Needle 32G X 4 MM MISC USE AS DIRECTED FOUR TIMES A DAY WITH LEVEMIR FLEXTOUCH AND HUMALOG PEN.  DX CODE E11.29   Insulin Syringe-Needle U-100 (BD INSULIN SYRINGE U/F) 31G X 5/16" 0.3 ML MISC Inject  insulin [place] TID as indicated per sliding scale instuctions.   JARDIANCE 25 MG TABS tablet Take 0.5 mg by mouth daily.    levothyroxine (SYNTHROID) 75 MCG tablet Take 1 tablet (75 mcg total) by mouth daily before breakfast.   metFORMIN (GLUCOPHAGE) 500 MG tablet Take 1 tablet (500 mg total) by mouth 2 (two) times daily with a meal. (Patient taking differently: Take 500 mg by mouth 2 (two) times daily with a meal. Pt taking 2 tablets (1000 mg) twice a day (2000 mg total))   metoprolol succinate (TOPROL-XL) 25 MG 24 hr tablet Take 1 tablet (25 mg total) by mouth daily.   Multiple Vitamin (MULTIVITAMIN WITH MINERALS) TABS tablet Take 1 tablet by mouth daily.   nystatin ointment (MYCOSTATIN) Apply 1 application topically 3 (three) times daily.   traMADol (ULTRAM) 50 MG tablet Take 100 mg by mouth daily.    valsartan (DIOVAN) 320 MG tablet Take 1 tablet (320 mg total) by mouth daily.   vitamin B-12 (CYANOCOBALAMIN) 1000 MCG tablet Take 1,000 mcg by mouth daily.   [DISCONTINUED] levothyroxine (SYNTHROID, LEVOTHROID) 50 MCG tablet Take 50 mcg by mouth daily before breakfast.   No facility-administered encounter medications on file as of 02/24/2021.    Surgical History: Past Surgical History:  Procedure Laterality Date   [place]  1990   GALLBLADDER SURGERY     LEG SURGERY Right    stent placement    Medical History: Past Medical History:  Diagnosis Date   Coronary artery disease    a. 1989 s/p MI w/ PTCA to unknown vessel. Reports cath 6 months later with occluded vessel and collaterals--> medically managed since..   Diabetes mellitus without complication ([place])    Hyperlipidemia LDL goal <70    Hypertension    a. 02/2020 nl renal artery duplex.   MI (myocardial infarction) ([place])    PAD (peripheral artery disease) ([place])    a. 12/2019 ABIs: R 1.02, L 0.56-->unchanged.   Sleep apnea     Family History: Family History  Problem Relation Age of  Onset   Diabetes Mother    Brain cancer Mother    Throat cancer Father    Prostate cancer Brother     Social History   Socioeconomic History   Marital status: Married    Spouse name: Not on file   Number of children: Not on file   Years of education: Not on file   Highest education level: Not on file  Occupational History   Not on file  Tobacco Use   Smoking status: Former    Packs/day: 3.00    Years: 32.00    Pack years: 96.00    Types: Cigarettes   Smokeless tobacco: Never  Vaping Use   Vaping Use: Never used  Substance and Sexual Activity   Alcohol use: No   Drug use: No   Sexual activity: Not on file  Other Topics Concern   Not on file  Social History Narrative   Not on file   Social Determinants of Health   Financial Resource Strain: Not on file  Food Insecurity: Not on file  Transportation Needs: Not on file  Physical Activity: Not on file  Stress: Not on file  Social Connections: Not on file  Intimate Partner Violence: Not on file      Review of Systems  Constitutional:  Negative for chills, fatigue and unexpected weight change.  HENT:  Positive for postnasal drip. Negative for congestion, rhinorrhea, sneezing and sore throat.   Eyes:  Negative for redness.  Respiratory:  Negative for cough, chest tightness and shortness of breath.   Cardiovascular:  Negative for chest pain and palpitations.  Gastrointestinal:  Negative for abdominal pain, constipation, diarrhea, nausea and vomiting.  Genitourinary:  Negative for dysuria and frequency.  Musculoskeletal:  Negative for arthralgias, back pain, joint swelling and neck pain.  Skin:  Negative for rash.  Neurological: Negative.  Negative for tremors and numbness.  Hematological:  Negative for adenopathy. Does not bruise/bleed easily.  Psychiatric/Behavioral:  Negative for behavioral problems (Depression), sleep disturbance and suicidal ideas. The patient is not nervous/anxious.    Vital Signs: BP (!)  153/49    Pulse (!) 59    Temp 98 F (36.7 C)    Resp 16    Ht '5\' 8"'  (1.727 m)    Wt 220 lb 9.6 oz (100.1 kg)    SpO2 98%    BMI 33.54 kg/m    Physical Exam Constitutional:      Appearance: Normal appearance.  HENT:     Head: Normocephalic and atraumatic.     Nose: Nose normal.     Mouth/Throat:     Mouth: Mucous membranes are moist.     Pharynx: No posterior oropharyngeal erythema.  Eyes:     Extraocular Movements: Extraocular movements intact.  Pupils: Pupils are equal, round, and reactive to light.  Cardiovascular:     Pulses: Normal pulses.     Heart sounds: Normal heart sounds.  Pulmonary:     Effort: Pulmonary effort is normal.     Breath sounds: Normal breath sounds.  Neurological:     General: No focal deficit present.     Mental Status: He is alert.  Psychiatric:        Mood and Affect: Mood normal.        Behavior: Behavior normal.    Assessment/Plan: 1. Type 2 diabetes mellitus with stage 3a chronic kidney disease, with long-term current use of insulin (HCC) Will add samples of Basaglar 10 -20 units, dc metformin  - POCT HgB A1C  2. Hypothyroidism, unspecified type Increase Synthroid to 75 mcg po q am   3. Benign hypertension Slightly elevated systolic BP, will monitor for now - Basic Metabolic Panel (BMET)  4. 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  Risk Modification Advice: She was counseled on portion control guidelines. Restricting daily caloric intake to 1800ADA. The detrimental long term effects of obesity on her health and ongoing poor compliance was also discussed with the patient.     General Counseling: ivon oelkers understanding of the findings of todays visit and agrees with plan of treatment. I have discussed any further diagnostic evaluation that may be needed or  ordered today. We also reviewed his medications today. he has been encouraged to call the office with any questions or concerns that should arise related to todays visit.    Orders Placed This Encounter  Procedures   Basic Metabolic Panel (BMET)   POCT HgB A1C    Meds ordered this encounter  Medications   levothyroxine (SYNTHROID) 75 MCG tablet    Sig: Take 1 tablet (75 mcg total) by mouth daily before breakfast.    Dispense:  90 tablet    Refill:  3    Total time spent:35 Minutes Time spent includes review of chart, medications, test results, and follow up plan with the patient.   [place] Controlled Substance Database was reviewed by me.   Dr Lavera Guise Internal medicine

## 2021-03-05 ENCOUNTER — Ambulatory Visit: Payer: Medicare Other | Admitting: Physician Assistant

## 2021-03-20 DIAGNOSIS — I10 Essential (primary) hypertension: Secondary | ICD-10-CM | POA: Diagnosis not present

## 2021-03-21 LAB — BASIC METABOLIC PANEL
BUN/Creatinine Ratio: 19 (ref 10–24)
BUN: 23 mg/dL (ref 8–27)
CO2: 25 mmol/L (ref 20–29)
Calcium: 9.7 mg/dL (ref 8.6–10.2)
Chloride: 99 mmol/L (ref 96–106)
Creatinine, Ser: 1.22 mg/dL (ref 0.76–1.27)
Glucose: 192 mg/dL — ABNORMAL HIGH (ref 70–99)
Potassium: 4.6 mmol/L (ref 3.5–5.2)
Sodium: 138 mmol/L (ref 134–144)
eGFR: 60 mL/min/{1.73_m2} (ref >59)

## 2021-03-24 ENCOUNTER — Ambulatory Visit (INDEPENDENT_AMBULATORY_CARE_PROVIDER_SITE_OTHER): Payer: Medicare Other | Admitting: Internal Medicine

## 2021-03-24 ENCOUNTER — Encounter: Payer: Self-pay | Admitting: Internal Medicine

## 2021-03-24 ENCOUNTER — Other Ambulatory Visit: Payer: Self-pay

## 2021-03-24 VITALS — BP 140/70 | HR 50 | Temp 98.3°F | Resp 16 | Ht 68.0 in | Wt 224.4 lb

## 2021-03-24 DIAGNOSIS — N1831 Chronic kidney disease, stage 3a: Secondary | ICD-10-CM

## 2021-03-24 DIAGNOSIS — E1122 Type 2 diabetes mellitus with diabetic chronic kidney disease: Secondary | ICD-10-CM | POA: Diagnosis not present

## 2021-03-24 DIAGNOSIS — E039 Hypothyroidism, unspecified: Secondary | ICD-10-CM

## 2021-03-24 DIAGNOSIS — Z794 Long term (current) use of insulin: Secondary | ICD-10-CM | POA: Diagnosis not present

## 2021-03-24 DIAGNOSIS — I10 Essential (primary) hypertension: Secondary | ICD-10-CM | POA: Diagnosis not present

## 2021-03-24 MED ORDER — VALSARTAN 160 MG PO TABS: 160.0000 mg | ORAL_TABLET | Freq: Every day | ORAL | 3 refills | Status: DC

## 2021-03-24 NOTE — Progress Notes (Signed)
[facility] [place], [place] [address]  Internal MEDICINE  Office Visit Note  Patient Name: Allen [name]  [id number]  [id number]  Date of Service: 04/13/2021  Chief Complaint  Patient presents with   Follow-up   Hyperlipidemia   Hypertension   Diabetes   Medication Refill    Needs more insulin syringes - 78m, 31 gauge (0.246m    HPI  Patient is here for routine follow-up, on previous visit so some adjustments in his medications were done.  -Hypertension is controlled, and Diovan was reduced to 160 mg once a day due to risk of hyperkalemia  -His Synthroid was also increased on previous visit due to elevated TSH. -I also added long-acting baseline insulin on previous visit, his metformin was stopped due to elevated creatinine however on recent labs creatinine is back to normal Current Medication: Outpatient Encounter Medications as of 03/24/2021  Medication Sig   aspirin 81 MG chewable tablet Chew by mouth daily.   b complex vitamins tablet Take 1 tablet by mouth daily.   B-D ULTRA-FINE 33 LANCETS MISC Use 1 Lancet 3 (three) times daily.   cetirizine (ZYRTEC) 10 MG tablet Take 10 mg by mouth daily.   Cholecalciferol (VITAMIN D3) 1000 units CAPS Take by mouth.   Continuous Blood Gluc Sensor (FREESTYLE LIBRE 14 DAY SENSOR) MISC Use 1 kit every 14 (fourteen) days   Continuous Blood Gluc Sensor (FREESTYLE LIBRE SENSOR SYSTEM) MISC Use 3 each every 10 (ten) days.   ezetimibe-simvastatin (VYTORIN) 10-40 MG tablet Take 1 tablet by mouth daily.   folic acid (FOLVITE) 40128CG tablet Take 400 mcg by mouth daily.   Garlic 107867G CAPS Take by mouth.   glipiZIDE (GLUCOTROL XL) 5 MG 24 hr tablet Take 1 tablet (5 mg total) by mouth in the morning and at bedtime.   glucose blood test strip Check blood sugar 5x a day as directed   insulin aspart (NOVOLOG) 100 UNIT/ML FlexPen INJECT 10 UNITS UNDER SKIN TWICE DAILY BEFORE MORNING AND EVENING MEAL FOR DIABETES.DISCARD PEN  28 DAYS AFTER OPENING PLUS SLIDING SCALE   Insulin Pen Needle 32G X 4 MM MISC USE AS DIRECTED FOUR TIMES A DAY WITH LEVEMIR FLEXTOUCH AND HUMALOG PEN.  DX CODE E11.29   Insulin Syringe-Needle U-100 (BD INSULIN SYRINGE U/F) 31G X 5/16" 0.3 ML MISC Inject insulin [place] TID as indicated per sliding scale instuctions.   JARDIANCE 25 MG TABS tablet Take 0.5 mg by mouth daily.    levothyroxine (SYNTHROID) 75 MCG tablet Take 1 tablet (75 mcg total) by mouth daily before breakfast.   metoprolol succinate (TOPROL-XL) 25 MG 24 hr tablet Take 1 tablet (25 mg total) by mouth daily.   Multiple Vitamin (MULTIVITAMIN WITH MINERALS) TABS tablet Take 1 tablet by mouth daily.   nystatin ointment (MYCOSTATIN) Apply 1 application topically 3 (three) times daily.   traMADol (ULTRAM) 50 MG tablet Take 100 mg by mouth daily.    valsartan (DIOVAN) 160 MG tablet Take 1 tablet (160 mg total) by mouth daily.   vitamin B-12 (CYANOCOBALAMIN) 1000 MCG tablet Take 1,000 mcg by mouth daily.   [DISCONTINUED] metFORMIN (GLUCOPHAGE) 500 MG tablet Take 1 tablet (500 mg total) by mouth 2 (two) times daily with a meal. (Patient taking differently: Take 500 mg by mouth 2 (two) times daily with a meal. Pt taking 2 tablets (1000 mg) twice a day (2000 mg total))   [DISCONTINUED] valsartan (DIOVAN) 320 MG tablet Take 1 tablet (320 mg total) by  mouth daily.   No facility-administered encounter medications on file as of 03/24/2021.    Surgical History: Past Surgical History:  Procedure Laterality Date   [place]   GALLBLADDER SURGERY     LEG SURGERY Right    stent placement    Medical History: Past Medical History:  Diagnosis Date   Coronary artery disease    a. 1989 s/p MI w/ PTCA to unknown vessel. Reports cath 6 months later with occluded vessel and collaterals--> medically managed since..   Diabetes mellitus without complication ([place])    Hyperlipidemia LDL goal <70     Hypertension    a. 02/2020 nl renal artery duplex.   MI (myocardial infarction) ([place])    PAD (peripheral artery disease) ([place])    a. 12/2019 ABIs: R 1.02, L 0.56-->unchanged.   Sleep apnea     Family History: Family History  Problem Relation Age of Onset   Diabetes Mother    Brain cancer Mother    Throat cancer Father    Prostate cancer Brother     Social History   Socioeconomic History   Marital status: Married    Spouse name: Not on file   Number of children: Not on file   Years of education: Not on file   Highest education level: Not on file  Occupational History   Not on file  Tobacco Use   Smoking status: Former    Packs/day: 3.00    Years: 32.00    Pack years: 96.00    Types: Cigarettes   Smokeless tobacco: Never  Vaping Use   Vaping Use: Never used  Substance and Sexual Activity   Alcohol use: No   Drug use: No   Sexual activity: Not on file  Other Topics Concern   Not on file  Social History Narrative   Not on file   Social Determinants of Health   Financial Resource Strain: Not on file  Food Insecurity: Not on file  Transportation Needs: Not on file  Physical Activity: Not on file  Stress: Not on file  Social Connections: Not on file  Intimate Partner Violence: Not on file      Review of Systems  Constitutional:  Negative for fatigue and fever.  HENT:  Negative for congestion, mouth sores and postnasal drip.   Respiratory:  Negative for cough.   Cardiovascular:  Negative for chest pain.  Genitourinary:  Negative for flank pain.  Psychiatric/Behavioral: Negative.     Vital Signs: BP 140/70 Comment: 146/70   Pulse (!) 50    Temp 98.3 F (36.8 C)    Resp 16    Ht _0  (1.727 m)    Wt 224 lb 6.4 oz (101.8 kg)    SpO2 99%    BMI 34.12 kg/m    Physical Exam Constitutional:      Appearance: Normal appearance.  HENT:     Head: Normocephalic and atraumatic.     Nose: Nose normal.     Mouth/Throat:     Mouth: Mucous membranes are moist.      Pharynx: No posterior oropharyngeal erythema.  Eyes:     Extraocular Movements: Extraocular movements intact.     Pupils: Pupils are equal, round, and reactive to light.  Cardiovascular:     Pulses: Normal pulses.     Heart sounds: Normal heart sounds.  Pulmonary:     Effort: Pulmonary effort is normal.     Breath sounds: Normal breath sounds.  Neurological:     General: No focal deficit present.     Mental Status: He is alert.  Psychiatric:        Mood and Affect: Mood normal.        Behavior: Behavior normal.       Assessment/Plan: 1. Type 2 diabetes mellitus with stage 3a chronic kidney disease, with long-term current use of insulin (HCC) Will continue Jardiance 25 mg half tab a day, per endo. Will add metformin back 500 mg bid, will add Basaglar basal insulin between 15 to 20 units patient also is on high-dose mealtime coverage as well he also takes glyburide per endocrine  2. Hypothyroidism, unspecified type Continue Synthroid as before  3. Benign hypertension Patient blood pressure is improved we will continue on Diovan his potassium level is also better monitor heart rate Toprol is given by cardiology  General Counseling: Pilar Grammes understanding of the findings of todays visit and agrees with plan of treatment. I have discussed any further diagnostic evaluation that may be needed or ordered today. We also reviewed his medications today. he has been encouraged to call the office with any questions or concerns that should arise related to todays visit.    No orders of the defined types were placed in this encounter.   Meds ordered this encounter  Medications   valsartan (DIOVAN) 160 MG tablet    Sig: Take 1 tablet (160 mg total) by mouth daily.    Dispense:  90 tablet    Refill:  3    Total time spent:30 Minutes Time spent includes review of chart, medications, test results, and follow up plan with the patient.   [place] Controlled Substance Database was  reviewed by me.   Dr Lavera Guise Internal medicine

## 2021-04-02 ENCOUNTER — Telehealth: Payer: Self-pay

## 2021-04-02 NOTE — Telephone Encounter (Signed)
Pt given 2 samples of BASAGLAR.

## 2021-04-08 ENCOUNTER — Other Ambulatory Visit: Payer: Self-pay | Admitting: Physician Assistant

## 2021-04-15 ENCOUNTER — Other Ambulatory Visit: Payer: Self-pay | Admitting: Physician Assistant

## 2021-04-17 ENCOUNTER — Other Ambulatory Visit: Payer: Self-pay | Admitting: Nurse Practitioner

## 2021-04-21 ENCOUNTER — Encounter: Payer: Self-pay | Admitting: Internal Medicine

## 2021-04-21 ENCOUNTER — Ambulatory Visit (INDEPENDENT_AMBULATORY_CARE_PROVIDER_SITE_OTHER): Payer: Medicare Other | Admitting: Internal Medicine

## 2021-04-21 ENCOUNTER — Other Ambulatory Visit: Payer: Self-pay

## 2021-04-21 VITALS — BP 135/82 | HR 71 | Temp 98.3°F | Resp 16 | Ht 68.0 in | Wt 224.0 lb

## 2021-04-21 DIAGNOSIS — N1831 Chronic kidney disease, stage 3a: Secondary | ICD-10-CM | POA: Diagnosis not present

## 2021-04-21 DIAGNOSIS — I10 Essential (primary) hypertension: Secondary | ICD-10-CM

## 2021-04-21 DIAGNOSIS — Z794 Long term (current) use of insulin: Secondary | ICD-10-CM

## 2021-04-21 DIAGNOSIS — E1122 Type 2 diabetes mellitus with diabetic chronic kidney disease: Secondary | ICD-10-CM

## 2021-04-21 DIAGNOSIS — E039 Hypothyroidism, unspecified: Secondary | ICD-10-CM | POA: Diagnosis not present

## 2021-04-21 MED ORDER — EMPAGLIFLOZIN 25 MG PO TABS: 25.0000 mg | ORAL_TABLET | Freq: Every day | ORAL | 3 refills | Status: AC

## 2021-04-21 MED ORDER — BASAGLAR KWIKPEN 100 UNIT/ML ~~LOC~~ SOPN: PEN_INJECTOR | SUBCUTANEOUS | 3 refills | Status: DC

## 2021-04-21 NOTE — Progress Notes (Incomplete)
[facility] [place], [place] [address]  Internal MEDICINE  Office Visit Note  Patient Name: Allen [name]  [id number]  [id number]  Date of Service: 04/21/2021  Chief Complaint  Patient presents with   Follow-up   Diabetes   Hypertension   Hyperlipidemia   Medication Refill    Needs 100 unit insulin syringes sent to [facility]    HPI     Current Medication: Outpatient Encounter Medications as of 04/21/2021  Medication Sig   aspirin 81 MG chewable tablet Chew by mouth daily.   b complex vitamins tablet Take 1 tablet by mouth daily.   B-D ULTRA-FINE 33 LANCETS MISC Use 1 Lancet 3 (three) times daily.   cetirizine (ZYRTEC) 10 MG tablet Take 10 mg by mouth daily.   Cholecalciferol (VITAMIN D3) 1000 units CAPS Take by mouth.   Continuous Blood Gluc Sensor (FREESTYLE LIBRE 14 DAY SENSOR) MISC Use 1 kit every 14 (fourteen) days   Continuous Blood Gluc Sensor (FREESTYLE LIBRE SENSOR SYSTEM) MISC Use 3 each every 10 (ten) days.   ezetimibe-simvastatin (VYTORIN) 10-40 MG tablet TAKE 1 TABLET BY MOUTH DAILY   folic acid (FOLVITE) 841 MCG tablet Take 400 mcg by mouth daily.   Garlic 3244 MG CAPS Take by mouth.   glipiZIDE (GLUCOTROL XL) 5 MG 24 hr tablet Take 1 tablet (5 mg total) by mouth in the morning and at bedtime.   glucose blood test strip Check blood sugar 5x a day as directed   insulin aspart (NOVOLOG) 100 UNIT/ML FlexPen INJECT 10 UNITS UNDER SKIN TWICE DAILY BEFORE MORNING AND EVENING MEAL FOR DIABETES.DISCARD PEN 28 DAYS AFTER OPENING PLUS SLIDING SCALE   Insulin Pen Needle 32G X 4 MM MISC USE AS DIRECTED FOUR TIMES A DAY WITH LEVEMIR FLEXTOUCH AND HUMALOG PEN.  DX CODE E11.29   Insulin Syringe-Needle U-100 (BD INSULIN SYRINGE U/F) 31G X 5/16" 0.3 ML MISC Inject insulin [place] TID as indicated per sliding scale instuctions.   JARDIANCE 25 MG TABS tablet Take 0.5 mg by mouth daily.    levothyroxine (SYNTHROID) 75 MCG tablet Take 1 tablet  (75 mcg total) by mouth daily before breakfast.   metFORMIN (GLUCOPHAGE) 500 MG tablet TAKE 1 TABLET(500 MG) BY MOUTH TWICE DAILY WITH A MEAL   metoprolol succinate (TOPROL-XL) 25 MG 24 hr tablet TAKE 1 TABLET(25 MG) BY MOUTH DAILY   Multiple Vitamin (MULTIVITAMIN WITH MINERALS) TABS tablet Take 1 tablet by mouth daily.   nystatin ointment (MYCOSTATIN) Apply 1 application topically 3 (three) times daily.   traMADol (ULTRAM) 50 MG tablet Take 100 mg by mouth daily.    valsartan (DIOVAN) 160 MG tablet Take 1 tablet (160 mg total) by mouth daily.   vitamin B-12 (CYANOCOBALAMIN) 1000 MCG tablet Take 1,000 mcg by mouth daily.   No facility-administered encounter medications on file as of 04/21/2021.    Surgical History: Past Surgical History:  Procedure Laterality Date   [place]   GALLBLADDER SURGERY     LEG SURGERY Right    stent placement    Medical History: Past Medical History:  Diagnosis Date   Coronary artery disease    a. 1989 s/p MI w/ PTCA to unknown vessel. Reports cath 6 months later with occluded vessel and collaterals--> medically managed since..   Diabetes mellitus without complication ([place])    Hyperlipidemia LDL goal <70    Hypertension    a. 02/2020 nl renal artery duplex.   MI (  myocardial infarction) ([place])    PAD (peripheral artery disease) ([place])    a. 12/2019 ABIs: R 1.02, L 0.56-->unchanged.   Sleep apnea     Family History: Family History  Problem Relation Age of Onset   Diabetes Mother    Brain cancer Mother    Throat cancer Father    Prostate cancer Brother     Social History   Socioeconomic History   Marital status: Married    Spouse name: Not on file   Number of children: Not on file   Years of education: Not on file   Highest education level: Not on file  Occupational History   Not on file  Tobacco Use   Smoking status: Former    Packs/day: 3.00    Years: 32.00     Pack years: 96.00    Types: Cigarettes   Smokeless tobacco: Never  Vaping Use   Vaping Use: Never used  Substance and Sexual Activity   Alcohol use: No   Drug use: No   Sexual activity: Not on file  Other Topics Concern   Not on file  Social History Narrative   Not on file   Social Determinants of Health   Financial Resource Strain: Not on file  Food Insecurity: Not on file  Transportation Needs: Not on file  Physical Activity: Not on file  Stress: Not on file  Social Connections: Not on file  Intimate Partner Violence: Not on file      Review of Systems  Vital Signs: BP 135/82    Pulse 71    Temp 98.3 F (36.8 C)    Resp 16    Ht _0  (1.727 m)    Wt 224 lb (101.6 kg)    SpO2 97%    BMI 34.06 kg/m    Physical Exam     Assessment/Plan:   General Counseling: Jovann verbalizes understanding of the findings of todays visit and agrees with plan of treatment. I have discussed any further diagnostic evaluation that may be needed or ordered today. We also reviewed his medications today. he has been encouraged to call the office with any questions or concerns that should arise related to todays visit.    No orders of the defined types were placed in this encounter.   No orders of the defined types were placed in this encounter.   Total time spent:*** Minutes Time spent includes review of chart, medications, test results, and follow up plan with the patient.   [place] Controlled Substance Database was reviewed by me.   Dr Lavera Guise Internal medicine

## 2021-04-29 ENCOUNTER — Other Ambulatory Visit: Payer: Self-pay

## 2021-04-29 ENCOUNTER — Ambulatory Visit (INDEPENDENT_AMBULATORY_CARE_PROVIDER_SITE_OTHER): Payer: Medicare Other

## 2021-04-29 DIAGNOSIS — G4733 Obstructive sleep apnea (adult) (pediatric): Secondary | ICD-10-CM

## 2021-04-29 NOTE — Progress Notes (Signed)
95 percentile pressure 9 ? ? 95th percentile leak 31.9 ? ? apnea index 0.9 /hr ? apnea-hypopnea index  2.0 /hr ? ? total days used  >4 hr 90 days ? total days used <4 hr 0 days ? ?Total compliance 100 percent ? ?He is doing great no problems or questions at this time. ? ? ?Pt was seen by Claiborne Billings  RRT/RCP  from [facility]  ?

## 2021-05-13 DIAGNOSIS — E039 Hypothyroidism, unspecified: Secondary | ICD-10-CM | POA: Diagnosis not present

## 2021-05-13 DIAGNOSIS — I10 Essential (primary) hypertension: Secondary | ICD-10-CM | POA: Diagnosis not present

## 2021-05-14 LAB — BASIC METABOLIC PANEL
BUN/Creatinine Ratio: 30 — ABNORMAL HIGH (ref 10–24)
BUN: 38 mg/dL — ABNORMAL HIGH (ref 8–27)
CO2: 24 mmol/L (ref 20–29)
Calcium: 9.6 mg/dL (ref 8.6–10.2)
Chloride: 101 mmol/L (ref 96–106)
Creatinine, Ser: 1.27 mg/dL (ref 0.76–1.27)
Glucose: 163 mg/dL — ABNORMAL HIGH (ref 70–99)
Potassium: 4.8 mmol/L (ref 3.5–5.2)
Sodium: 138 mmol/L (ref 134–144)
eGFR: 57 mL/min/{1.73_m2} — ABNORMAL LOW (ref >59)

## 2021-05-14 LAB — TSH+FREE T4
Free T4: 1.46 ng/dL (ref 0.82–1.77)
TSH: 2.28 u[IU]/mL (ref 0.450–4.500)

## 2021-05-19 ENCOUNTER — Telehealth: Payer: Self-pay

## 2021-05-19 ENCOUNTER — Encounter: Payer: Self-pay | Admitting: Internal Medicine

## 2021-05-19 ENCOUNTER — Other Ambulatory Visit: Payer: Self-pay

## 2021-05-19 ENCOUNTER — Ambulatory Visit (INDEPENDENT_AMBULATORY_CARE_PROVIDER_SITE_OTHER): Payer: Medicare Other | Admitting: Internal Medicine

## 2021-05-19 VITALS — BP 132/72 | HR 67 | Temp 97.6°F | Resp 16 | Ht 68.0 in | Wt 224.4 lb

## 2021-05-19 DIAGNOSIS — E875 Hyperkalemia: Secondary | ICD-10-CM | POA: Diagnosis not present

## 2021-05-19 DIAGNOSIS — Z794 Long term (current) use of insulin: Secondary | ICD-10-CM | POA: Diagnosis not present

## 2021-05-19 DIAGNOSIS — E118 Type 2 diabetes mellitus with unspecified complications: Secondary | ICD-10-CM

## 2021-05-19 DIAGNOSIS — E1122 Type 2 diabetes mellitus with diabetic chronic kidney disease: Secondary | ICD-10-CM

## 2021-05-19 DIAGNOSIS — N1831 Chronic kidney disease, stage 3a: Secondary | ICD-10-CM

## 2021-05-19 DIAGNOSIS — I700 Atherosclerosis of aorta: Secondary | ICD-10-CM | POA: Diagnosis not present

## 2021-05-19 DIAGNOSIS — Z6834 Body mass index (BMI) 34.0-34.9, adult: Secondary | ICD-10-CM

## 2021-05-19 DIAGNOSIS — Z79899 Other long term (current) drug therapy: Secondary | ICD-10-CM

## 2021-05-19 LAB — POCT GLYCOSYLATED HEMOGLOBIN (HGB A1C): Hemoglobin A1C: 7.2 % — AB (ref 4.0–5.6)

## 2021-05-19 MED ORDER — INSULIN PEN NEEDLE 32G X 4 MM MISC: 3 refills | Status: DC

## 2021-05-19 NOTE — Progress Notes (Signed)
[facility] ?[address] ?[place], [place] [address] ? ?Internal MEDICINE  ?Office Visit Note ? ?Patient Name: Allen [name] ? [id number]  ?[id number] ? ?Date of Service: 05/25/2021 ? ?Chief Complaint  ?Patient presents with  ? Follow-up  ? Diabetes  ? Hypertension  ? Hyperlipidemia  ? ? ?HPI ? ? ?Patient is here for follow-up on diabetes hypertension and elevated potassium, patient did have elevated baseline creatinine in the past however improved on previous labs. ?Diabetic treatment has been optimized on each visit on review of his diabetic glucose log patient has more steady control on his numbers, he is on long-acting basal insulin and sliding scale, patient is also on metformin Jardiance and glipizide, he has not had any hypoglycemic events. ?Patient is unable to tolerate any leptins due to GI side effects ?Patient is on Diovan 160 the dose was reduced due to elevated potassium and low blood pressure ? ?Current Medication: ?Outpatient Encounter Medications as of 05/19/2021  ?Medication Sig  ? aspirin 81 MG chewable tablet Chew by mouth daily.  ? b complex vitamins tablet Take 1 tablet by mouth daily.  ? B-D ULTRA-FINE 33 LANCETS MISC Use 1 Lancet 3 (three) times daily.  ? cetirizine (ZYRTEC) 10 MG tablet Take 10 mg by mouth daily.  ? Cholecalciferol (VITAMIN D3) 1000 units CAPS Take by mouth.  ? Continuous Blood Gluc Sensor (FREESTYLE LIBRE 14 DAY SENSOR) MISC Use 1 kit every 14 (fourteen) days  ? Continuous Blood Gluc Sensor (FREESTYLE LIBRE SENSOR SYSTEM) MISC Use 3 each every 10 (ten) days.  ? empagliflozin (JARDIANCE) 25 MG TABS tablet Take 1 tablet (25 mg total) by mouth daily before breakfast.  ? ezetimibe-simvastatin (VYTORIN) 10-40 MG tablet TAKE 1 TABLET BY MOUTH DAILY  ? folic acid (FOLVITE) 884 MCG tablet Take 400 mcg by mouth daily.  ? Garlic 1660 MG CAPS Take by mouth.  ? glipiZIDE (GLUCOTROL XL) 5 MG 24 hr tablet Take 1 tablet (5 mg total) by mouth in the morning and at bedtime.  ? glucose  blood test strip Check blood sugar 5x a day as directed  ? insulin aspart (NOVOLOG) 100 UNIT/ML FlexPen INJECT 10 UNITS UNDER SKIN TWICE DAILY BEFORE MORNING AND EVENING MEAL FOR DIABETES.DISCARD PEN 28 DAYS AFTER OPENING PLUS SLIDING SCALE  ? Insulin Glargine (BASAGLAR KWIKPEN) 100 UNIT/ML Inject 26 to 40 units once a day sq  ? Insulin Syringe-Needle U-100 (BD INSULIN SYRINGE U/F) 31G X 5/16" 0.3 ML MISC Inject insulin [place] TID as indicated per sliding scale instuctions.  ? levothyroxine (SYNTHROID) 75 MCG tablet Take 1 tablet (75 mcg total) by mouth daily before breakfast.  ? metFORMIN (GLUCOPHAGE) 500 MG tablet TAKE 1 TABLET(500 MG) BY MOUTH TWICE DAILY WITH A MEAL  ? metoprolol succinate (TOPROL-XL) 25 MG 24 hr tablet TAKE 1 TABLET(25 MG) BY MOUTH DAILY  ? Multiple Vitamin (MULTIVITAMIN WITH MINERALS) TABS tablet Take 1 tablet by mouth daily.  ? nystatin ointment (MYCOSTATIN) Apply 1 application topically 3 (three) times daily.  ? traMADol (ULTRAM) 50 MG tablet Take 100 mg by mouth daily.   ? valsartan (DIOVAN) 160 MG tablet Take 1 tablet (160 mg total) by mouth daily.  ? vitamin B-12 (CYANOCOBALAMIN) 1000 MCG tablet Take 1,000 mcg by mouth daily.  ? [DISCONTINUED] Insulin Pen Needle 32G X 4 MM MISC USE AS DIRECTED FOUR TIMES A DAY WITH LEVEMIR FLEXTOUCH AND HUMALOG PEN.  DX CODE E11.29  ? Insulin Pen Needle 32G X 4 MM MISC USE AS DIRECTED FOUR  TIMES A DAY WITH LEVEMIR [place] AND HUMALOG PEN.  DX CODE E11.29  ? ?No facility-administered encounter medications on file as of 05/19/2021.  ? ? ?Surgical History: ?Past Surgical History:  ?Procedure Laterality Date  ? CARDIAC CATHETERIZATION  1989  ? CARDIAC CATHETERIZATION  1990  ? GALLBLADDER SURGERY    ? LEG SURGERY Right   ? stent placement  ? ? ?Medical History: ?Past Medical History:  ?Diagnosis Date  ? Coronary artery disease   ? a. 1989 s/p MI w/ PTCA to unknown vessel. Reports cath 6 months later with occluded vessel and collaterals--> medically managed since..   ? Diabetes mellitus without complication ([place])   ? Hyperlipidemia LDL goal <70   ? Hypertension   ? a. 02/2020 nl renal artery duplex.  ? MI (myocardial infarction) ([place])   ? PAD (peripheral artery disease) ([place])   ? a. 12/2019 ABIs: R 1.02, L 0.56-->unchanged.  ? Sleep apnea   ? ? ?Family History: ?Family History  ?Problem Relation Age of Onset  ? Diabetes Mother   ? Brain cancer Mother   ? Throat cancer Father   ? Prostate cancer Brother   ? ? ?Social History  ? ?Socioeconomic History  ? Marital status: Married  ?  Spouse name: Not on file  ? Number of children: Not on file  ? Years of education: Not on file  ? Highest education level: Not on file  ?Occupational History  ? Not on file  ?Tobacco Use  ? Smoking status: Former  ?  Packs/day: 3.00  ?  Years: 32.00  ?  Pack years: 96.00  ?  Types: Cigarettes  ? Smokeless tobacco: Never  ?Vaping Use  ? Vaping Use: Never used  ?Substance and Sexual Activity  ? Alcohol use: No  ? Drug use: No  ? Sexual activity: Not on file  ?Other Topics Concern  ? Not on file  ?Social History Narrative  ? Not on file  ? ?Social Determinants of Health  ? ?Financial Resource Strain: Not on file  ?Food Insecurity: Not on file  ?Transportation Needs: Not on file  ?Physical Activity: Not on file  ?Stress: Not on file  ?Social Connections: Not on file  ?Intimate Partner Violence: Not on file  ? ? ? ? ?Review of Systems  ?Constitutional:  Negative for chills, fatigue and unexpected weight change.  ?HENT:  Negative for congestion, postnasal drip, rhinorrhea, sneezing and sore throat.   ?Eyes:  Negative for redness.  ?Respiratory:  Negative for cough, chest tightness and shortness of breath.   ?Cardiovascular:  Negative for chest pain and palpitations.  ?Gastrointestinal:  Negative for abdominal pain, constipation, diarrhea, nausea and vomiting.  ?Genitourinary:  Negative for dysuria and frequency.  ?Musculoskeletal:  Negative for arthralgias, back pain, joint swelling and neck pain.  ?Skin:   Negative for rash.  ?Neurological: Negative.  Negative for tremors and numbness.  ?Hematological:  Negative for adenopathy. Does not bruise/bleed easily.  ?Psychiatric/Behavioral:  Negative for behavioral problems (Depression), sleep disturbance and suicidal ideas. The patient is not nervous/anxious.   ? ?Vital Signs: ?BP 132/72   Pulse 67   Temp 97.6 ?F (36.4 ?C)   Resp 16   Ht '5\' 8"'  (1.727 m)   Wt 224 lb 6.4 oz (101.8 kg)   SpO2 97%   BMI 34.12 kg/m?  ? ? ?Physical Exam ?Constitutional:   ?   Appearance: Normal appearance.  ?HENT:  ?   Head: Normocephalic and atraumatic.  ?   Nose: Nose  normal.  ?   Mouth/Throat:  ?   Mouth: Mucous membranes are moist.  ?   Pharynx: No posterior oropharyngeal erythema.  ?Eyes:  ?   Extraocular Movements: Extraocular movements intact.  ?   Pupils: Pupils are equal, round, and reactive to light.  ?Cardiovascular:  ?   Pulses: Normal pulses.  ?   Heart sounds: Normal heart sounds.  ?Pulmonary:  ?   Effort: Pulmonary effort is normal.  ?   Breath sounds: Normal breath sounds.  ?Musculoskeletal:  ?   Left lower leg: Edema present.  ?Skin: ?   Findings: Erythema present.  ?Neurological:  ?   General: No focal deficit present.  ?   Mental Status: He is alert.  ?Psychiatric:     ?   Mood and Affect: Mood normal.     ?   Behavior: Behavior normal.  ? ? ? ? ? ?Assessment/Plan: ?1. Type 2 diabetes mellitus with stage 3a chronic kidney disease, with long-term current use of insulin ([place]) ?Renal functions are better, will continue to monitor, does want to go down on Lantus dose to 20 units, thinks it makes him gain wait ?- POCT HgB A1C, improved to 7.2 from 7.8 ?- Insulin Pen Needle 32G X 4 MM MISC; USE AS DIRECTED FOUR TIMES A DAY WITH LEVEMIR [place] AND HUMALOG PEN.  DX CODE E11.29  Dispense: 100 each; Refill: 3 ? ?2. Hyperkalemia ?Improved, will continue to monitor, Diovan dose was decreased  ? ?3. BMI 34.0-34.9,adult ?Obesity Counseling: ?Risk Assessment: An assessment of  behavioral risk factors was made today and includes lack of exercise sedentary lifestyle, he is conscious about his diet plan  ? ?Risk Modification Advice: She was counseled on portion control guidelines. Restricting

## 2021-05-19 NOTE — Telephone Encounter (Signed)
Spoke with pt and called in insulin syringes  ?

## 2021-06-16 ENCOUNTER — Encounter: Payer: Self-pay | Admitting: Internal Medicine

## 2021-06-16 ENCOUNTER — Ambulatory Visit (INDEPENDENT_AMBULATORY_CARE_PROVIDER_SITE_OTHER): Payer: Medicare Other | Admitting: Internal Medicine

## 2021-06-16 ENCOUNTER — Other Ambulatory Visit: Payer: Self-pay

## 2021-06-16 VITALS — BP 143/51 | HR 55 | Temp 98.1°F | Resp 16 | Ht 67.5 in | Wt 225.0 lb

## 2021-06-16 DIAGNOSIS — G4733 Obstructive sleep apnea (adult) (pediatric): Secondary | ICD-10-CM | POA: Diagnosis not present

## 2021-06-16 DIAGNOSIS — E118 Type 2 diabetes mellitus with unspecified complications: Secondary | ICD-10-CM

## 2021-06-16 MED ORDER — "INSULIN SYRINGE-NEEDLE U-100 31G X 5/16"" 0.3 ML MISC": 3 refills | Status: DC

## 2021-06-16 NOTE — Progress Notes (Signed)
[facility] ?[address] ?[place], [place] [address] ? ?Pulmonary Sleep Medicine  ? ?Office Visit Note ? ?Patient Name: Allen [name] ?DOB: 10-26-1940 ?MRN [id number] ? ?Date of Service: 06/16/2021 ? ?Complaints/HPI: OSA on CPAP. He is using the device as ordered. No issues noted Sleeps well at night feels fresh during the daytime. 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 He is working on getting it under control. Has been on insulin. Also working on losing weight ? ? ?ROS ? ?General: (-) fever, (-) chills, (-) night sweats, (-) weakness ?Skin: (-) rashes, (-) itching,. ?Eyes: (-) visual changes, (-) redness, (-) itching. ?Nose and Sinuses: (-) nasal stuffiness or itchiness, (-) postnasal drip, (-) nosebleeds, (-) sinus trouble. ?Mouth and Throat: (-) sore throat, (-) hoarseness. ?Neck: (-) swollen glands, (-) enlarged thyroid, (-) neck pain. ?Respiratory: - cough, (-) bloody sputum, - shortness of breath, - wheezing. ?Cardiovascular: - ankle swelling, (-) chest pain. ?Lymphatic: (-) lymph node enlargement. ?Neurologic: (-) numbness, (-) tingling. ?Psychiatric: (-) anxiety, (-) depression ? ? ?Current Medication: ?Outpatient Encounter Medications as of 06/16/2021  ?Medication Sig  ? aspirin 81 MG chewable tablet Chew by mouth daily.  ? b complex vitamins tablet Take 1 tablet by mouth daily.  ? B-D ULTRA-FINE 33 LANCETS MISC Use 1 Lancet 3 (three) times daily.  ? cetirizine (ZYRTEC) 10 MG tablet Take 10 mg by mouth daily.  ? Cholecalciferol (VITAMIN D3) 1000 units CAPS Take by mouth.  ? Continuous Blood Gluc Sensor (FREESTYLE LIBRE 14 DAY SENSOR) MISC Use 1 kit every 14 (fourteen) days  ? Continuous Blood Gluc Sensor (FREESTYLE LIBRE SENSOR SYSTEM) MISC Use 3 each every 10 (ten) days.  ? empagliflozin (JARDIANCE) 25 MG TABS tablet Take 1 tablet (25 mg total) by mouth daily before breakfast.  ? ezetimibe-simvastatin (VYTORIN) 10-40 MG tablet TAKE 1 TABLET BY MOUTH DAILY  ? folic acid  (FOLVITE) 094 MCG tablet Take 400 mcg by mouth daily.  ? Garlic 7096 MG CAPS Take by mouth.  ? glipiZIDE (GLUCOTROL XL) 5 MG 24 hr tablet Take 1 tablet (5 mg total) by mouth in the morning and at bedtime.  ? glucose blood test strip Check blood sugar 5x a day as directed  ? insulin aspart (NOVOLOG) 100 UNIT/ML FlexPen INJECT 10 UNITS UNDER SKIN TWICE DAILY BEFORE MORNING AND EVENING MEAL FOR DIABETES.DISCARD PEN 28 DAYS AFTER OPENING PLUS SLIDING SCALE  ? Insulin Glargine (BASAGLAR KWIKPEN) 100 UNIT/ML Inject 26 to 40 units once a day sq  ? Insulin Pen Needle 32G X 4 MM MISC USE AS DIRECTED FOUR TIMES A DAY WITH LEVEMIR FLEXTOUCH AND HUMALOG PEN.  DX CODE E11.29  ? Insulin Syringe-Needle U-100 (BD INSULIN SYRINGE U/F) 31G X 5/16" 0.3 ML MISC Inject insulin [place] TID as indicated per sliding scale instuctions.  ? Insulin Syringes, Disposable, U-100 1 ML MISC by Does not apply route.  ? levothyroxine (SYNTHROID) 75 MCG tablet Take 1 tablet (75 mcg total) by mouth daily before breakfast.  ? metFORMIN (GLUCOPHAGE) 500 MG tablet TAKE 1 TABLET(500 MG) BY MOUTH TWICE DAILY WITH A MEAL  ? metoprolol succinate (TOPROL-XL) 25 MG 24 hr tablet TAKE 1 TABLET(25 MG) BY MOUTH DAILY  ? Multiple Vitamin (MULTIVITAMIN WITH MINERALS) TABS tablet Take 1 tablet by mouth daily.  ? nystatin ointment (MYCOSTATIN) Apply 1 application topically 3 (three) times daily.  ? traMADol (ULTRAM) 50 MG tablet Take 100 mg by mouth daily.   ? valsartan (DIOVAN) 160 MG  tablet Take 1 tablet (160 mg total) by mouth daily.  ? vitamin B-12 (CYANOCOBALAMIN) 1000 MCG tablet Take 1,000 mcg by mouth daily.  ? ?No facility-administered encounter medications on file as of 06/16/2021.  ? ? ?Surgical History: ?Past Surgical History:  ?Procedure Laterality Date  ? CARDIAC CATHETERIZATION  1989  ? CARDIAC CATHETERIZATION  1990  ? GALLBLADDER SURGERY    ? LEG SURGERY Right   ? stent placement  ? ? ?Medical History: ?Past Medical History:  ?Diagnosis Date  ? Coronary artery  disease   ? a. 1989 s/p MI w/ PTCA to unknown vessel. Reports cath 6 months later with occluded vessel and collaterals--> medically managed since..  ? Diabetes mellitus without complication ([place])   ? Hyperlipidemia LDL goal <70   ? Hypertension   ? a. 02/2020 nl renal artery duplex.  ? MI (myocardial infarction) ([place])   ? PAD (peripheral artery disease) ([place])   ? a. 12/2019 ABIs: R 1.02, L 0.56-->unchanged.  ? Sleep apnea   ? ? ?Family History: ?Family History  ?Problem Relation Age of Onset  ? Diabetes Mother   ? Brain cancer Mother   ? Throat cancer Father   ? Prostate cancer Brother   ? ? ?Social History: ?Social History  ? ?Socioeconomic History  ? Marital status: Married  ?  Spouse name: Not on file  ? Number of children: Not on file  ? Years of education: Not on file  ? Highest education level: Not on file  ?Occupational History  ? Not on file  ?Tobacco Use  ? Smoking status: Former  ?  Packs/day: 3.00  ?  Years: 32.00  ?  Pack years: 96.00  ?  Types: Cigarettes  ? Smokeless tobacco: Never  ?Vaping Use  ? Vaping Use: Never used  ?Substance and Sexual Activity  ? Alcohol use: No  ? Drug use: No  ? Sexual activity: Not on file  ?Other Topics Concern  ? Not on file  ?Social History Narrative  ? Not on file  ? ?Social Determinants of Health  ? ?Financial Resource Strain: Not on file  ?Food Insecurity: Not on file  ?Transportation Needs: Not on file  ?Physical Activity: Not on file  ?Stress: Not on file  ?Social Connections: Not on file  ?Intimate Partner Violence: Not on file  ? ? ?Vital Signs: ?Blood pressure (!) 143/51, pulse (!) 55, temperature 98.1 ?F (36.7 ?C), resp. rate 16, height 5' 7.5" (1.715 m), weight 225 lb (102.1 kg), SpO2 98 %. ? ?Examination: ?General Appearance: The patient is well-developed, well-nourished, and in no distress. ?Skin: Gross inspection of skin unremarkable. ?Head: normocephalic, no gross deformities. ?Eyes: no gross deformities noted. ?ENT: ears appear grossly normal no  exudates. ?Neck: Supple. No thyromegaly. No LAD. ?Respiratory: no rhonchi noted. ?Cardiovascular: Normal S1 and S2 without murmur or rub. ?Extremities: No cyanosis. pulses are equal. ?Neurologic: Alert and oriented. No involuntary movements. ? ?LABS: ?Recent Results (from the past 2160 hour(s))  ?Basic Metabolic Panel (BMET)     Status: Abnormal  ? Collection Time: 03/20/21  1:05 PM  ?Result Value Ref Range  ? Glucose 192 (H) 70 - 99 mg/dL  ? BUN 23 8 - 27 mg/dL  ? Creatinine, Ser 1.22 0.76 - 1.27 mg/dL  ? eGFR 60 >59 mL/min/1.73  ? BUN/Creatinine Ratio 19 10 - 24  ? Sodium 138 134 - 144 mmol/L  ? Potassium 4.6 3.5 - 5.2 mmol/L  ? Chloride 99 96 - 106 mmol/L  ? CO2  25 20 - 29 mmol/L  ? Calcium 9.7 8.6 - 10.2 mg/dL  ?Basic Metabolic Panel (BMET)     Status: Abnormal  ? Collection Time: 05/13/21  2:29 PM  ?Result Value Ref Range  ? Glucose 163 (H) 70 - 99 mg/dL  ? BUN 38 (H) 8 - 27 mg/dL  ? Creatinine, Ser 1.27 0.76 - 1.27 mg/dL  ? eGFR 57 (L) >59 mL/min/1.73  ? BUN/Creatinine Ratio 30 (H) 10 - 24  ? Sodium 138 134 - 144 mmol/L  ? Potassium 4.8 3.5 - 5.2 mmol/L  ? Chloride 101 96 - 106 mmol/L  ? CO2 24 20 - 29 mmol/L  ? Calcium 9.6 8.6 - 10.2 mg/dL  ?TSH + free T4     Status: None  ? Collection Time: 05/13/21  2:29 PM  ?Result Value Ref Range  ? TSH 2.280 0.450 - 4.500 uIU/mL  ? Free T4 1.46 0.82 - 1.77 ng/dL  ?POCT HgB A1C     Status: Abnormal  ? Collection Time: 05/19/21 11:49 AM  ?Result Value Ref Range  ? Hemoglobin A1C 7.2 (A) 4.0 - 5.6 %  ? HbA1c POC (<> result, manual entry)    ? HbA1c, POC (prediabetic range)    ? HbA1c, POC (controlled diabetic range)    ? ? ?Radiology: ?No results found. ? ?No results found. ? ?No results found. ? ? ? ?Assessment and Plan: ?Patient Active Problem List  ? Diagnosis Date Noted  ? Need for shingles vaccine 01/22/2019  ? Need for vaccination against Streptococcus pneumoniae using pneumococcal conjugate vaccine 7 01/22/2019  ? Encounter for general adult medical examination with  abnormal findings 01/18/2018  ? Abnormal liver function 01/18/2018  ? S/P laparoscopic cholecystectomy 01/18/2018  ? 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 Cutaneous candidiasis 01/18/2018  ? Oral candidiasis 01/18/2018  ? S

## 2021-06-16 NOTE — Patient Instructions (Signed)

## 2021-07-03 DIAGNOSIS — M5416 Radiculopathy, lumbar region: Secondary | ICD-10-CM | POA: Diagnosis not present

## 2021-07-03 DIAGNOSIS — Z79899 Other long term (current) drug therapy: Secondary | ICD-10-CM | POA: Diagnosis not present

## 2021-07-03 DIAGNOSIS — M48062 Spinal stenosis, lumbar region with neurogenic claudication: Secondary | ICD-10-CM | POA: Diagnosis not present

## 2021-07-03 DIAGNOSIS — M5136 Other intervertebral disc degeneration, lumbar region: Secondary | ICD-10-CM | POA: Diagnosis not present

## 2021-07-17 ENCOUNTER — Other Ambulatory Visit: Payer: Self-pay | Admitting: Physician Assistant

## 2021-07-22 DIAGNOSIS — E113393 Type 2 diabetes mellitus with moderate nonproliferative diabetic retinopathy without macular edema, bilateral: Secondary | ICD-10-CM | POA: Diagnosis not present

## 2021-08-13 ENCOUNTER — Encounter: Payer: Self-pay | Admitting: Physician Assistant

## 2021-08-17 ENCOUNTER — Encounter: Payer: Self-pay | Admitting: Physician Assistant

## 2021-08-17 ENCOUNTER — Ambulatory Visit (INDEPENDENT_AMBULATORY_CARE_PROVIDER_SITE_OTHER): Payer: Medicare Other | Admitting: Physician Assistant

## 2021-08-17 VITALS — BP 134/60 | HR 55 | Temp 98.3°F | Resp 16 | Ht 68.0 in | Wt 225.8 lb

## 2021-08-17 DIAGNOSIS — I10 Essential (primary) hypertension: Secondary | ICD-10-CM

## 2021-08-17 DIAGNOSIS — Z794 Long term (current) use of insulin: Secondary | ICD-10-CM | POA: Diagnosis not present

## 2021-08-17 DIAGNOSIS — R001 Bradycardia, unspecified: Secondary | ICD-10-CM

## 2021-08-17 DIAGNOSIS — E1159 Type 2 diabetes mellitus with other circulatory complications: Secondary | ICD-10-CM | POA: Diagnosis not present

## 2021-08-17 LAB — POCT GLYCOSYLATED HEMOGLOBIN (HGB A1C): Hemoglobin A1C: 8 % — AB (ref 4.0–5.6)

## 2021-08-17 MED ORDER — GLIPIZIDE ER 5 MG PO TB24: 5.0000 mg | ORAL_TABLET | Freq: Every day | ORAL | 1 refills | Status: DC

## 2021-08-17 MED ORDER — "INSULIN SYRINGE 31G X 5/16"" 1 ML MISC": 3 refills | Status: DC

## 2021-08-17 NOTE — Progress Notes (Signed)
[facility] [place], [place] [address]  Internal MEDICINE  Office Visit Note  Patient Name: Allen [name]  [id number]  [id number]  Date of Service: 08/17/2021  Chief Complaint  Patient presents with   Follow-up   Diabetes   Hyperlipidemia   Hypertension    HPI Pt is here for routine follow up -taking 26 units long acting insulin, takes it in the middle of the night between 3-5am when he wakes up to use restroom, plus sliding scale once or twice per day depending on sugar readings and if eating. States it can be 97 in AM, but will go up to 200 at night when he then goes to take his long acting. Will increase basaglar to 30units and will monitor closely for sliding scale dosing as this decline may be contributing to rising A1c -taking 2 tabs of 518m metformin once per day in evening, jardiance once per day in AM and glipizide once per day in AM -requests larger syringe volume of 1.0 with same needle size -BP not checked at home, did improve on recheck. Pt does have bradycardia and states this is typical for him and denies any symptoms of dizziness  Current Medication: Outpatient Encounter Medications as of 08/17/2021  Medication Sig   aspirin 81 MG chewable tablet Chew by mouth daily.   b complex vitamins tablet Take 1 tablet by mouth daily.   B-D ULTRA-FINE 33 LANCETS MISC Use 1 Lancet 3 (three) times daily.   cetirizine (ZYRTEC) 10 MG tablet Take 10 mg by mouth daily.   Cholecalciferol (VITAMIN D3) 1000 units CAPS Take by mouth.   Continuous Blood Gluc Sensor (FREESTYLE LIBRE 14 DAY SENSOR) MISC Use 1 kit every 14 (fourteen) days   Continuous Blood Gluc Sensor (FREESTYLE LIBRE SENSOR SYSTEM) MISC Use 3 each every 10 (ten) days.   empagliflozin (JARDIANCE) 25 MG TABS tablet Take 1 tablet (25 mg total) by mouth daily before breakfast.   ezetimibe-simvastatin (VYTORIN) 10-40 MG tablet TAKE 1 TABLET BY MOUTH DAILY   folic acid (FOLVITE) 4026MCG tablet Take 400 mcg  by mouth daily.   Garlic 13785MG CAPS Take by mouth.   glucose blood test strip Check blood sugar 5x a day as directed   insulin aspart (NOVOLOG) 100 UNIT/ML FlexPen INJECT 10 UNITS UNDER SKIN TWICE DAILY BEFORE MORNING AND EVENING MEAL FOR DIABETES.DISCARD PEN 28 DAYS AFTER OPENING PLUS SLIDING SCALE   Insulin Glargine (BASAGLAR KWIKPEN) 100 UNIT/ML Inject 26 to 40 units once a day sq   Insulin Pen Needle 32G X 4 MM MISC USE AS DIRECTED FOUR TIMES A DAY WITH LEVEMIR FLEXTOUCH AND HUMALOG PEN.  DX CODE E11.29   Insulin Syringe-Needle U-100 (INSULIN SYRINGE 1CC/31GX5/16") 31G X 5/16" 1 ML MISC Use as directed with insulin   Insulin Syringes, Disposable, U-100 1 ML MISC by Does not apply route.   levothyroxine (SYNTHROID) 75 MCG tablet Take 1 tablet (75 mcg total) by mouth daily before breakfast.   metFORMIN (GLUCOPHAGE) 500 MG tablet TAKE 1 TABLET(500 MG) BY MOUTH TWICE DAILY WITH A MEAL   metoprolol succinate (TOPROL-XL) 25 MG 24 hr tablet TAKE 1 TABLET(25 MG) BY MOUTH DAILY   Multiple Vitamin (MULTIVITAMIN WITH MINERALS) TABS tablet Take 1 tablet by mouth daily.   nystatin ointment (MYCOSTATIN) Apply 1 application topically 3 (three) times daily.   traMADol (ULTRAM) 50 MG tablet Take 100 mg by mouth daily.    valsartan (DIOVAN) 160 MG tablet Take 1 tablet (160 mg total)  by mouth daily.   vitamin B-12 (CYANOCOBALAMIN) 1000 MCG tablet Take 1,000 mcg by mouth daily.   [DISCONTINUED] glipiZIDE (GLUCOTROL XL) 5 MG 24 hr tablet Take 1 tablet (5 mg total) by mouth in the morning and at bedtime.   [DISCONTINUED] Insulin Syringe-Needle U-100 (BD INSULIN SYRINGE U/F) 31G X 5/16" 0.3 ML MISC Inject insulin [place] TID as indicated per sliding scale instuctions.   glipiZIDE (GLUCOTROL XL) 5 MG 24 hr tablet Take 1 tablet (5 mg total) by mouth daily with breakfast.   No facility-administered encounter medications on file as of 08/17/2021.    Surgical History: Past Surgical History:  Procedure Laterality Date    [place]   GALLBLADDER SURGERY     LEG SURGERY Right    stent placement    Medical History: Past Medical History:  Diagnosis Date   Coronary artery disease    a. 1989 s/p MI w/ PTCA to unknown vessel. Reports cath 6 months later with occluded vessel and collaterals--> medically managed since..   Diabetes mellitus without complication ([place])    Hyperlipidemia LDL goal <70    Hypertension    a. 02/2020 nl renal artery duplex.   MI (myocardial infarction) ([place])    PAD (peripheral artery disease) ([place])    a. 12/2019 ABIs: R 1.02, L 0.56-->unchanged.   Sleep apnea     Family History: Family History  Problem Relation Age of Onset   Diabetes Mother    Brain cancer Mother    Throat cancer Father    Prostate cancer Brother     Social History   Socioeconomic History   Marital status: Married    Spouse name: Not on file   Number of children: Not on file   Years of education: Not on file   Highest education level: Not on file  Occupational History   Not on file  Tobacco Use   Smoking status: Former    Packs/day: 3.00    Years: 32.00    Total pack years: 96.00    Types: Cigarettes   Smokeless tobacco: Never  Vaping Use   Vaping Use: Never used  Substance and Sexual Activity   Alcohol use: No   Drug use: No   Sexual activity: Not on file  Other Topics Concern   Not on file  Social History Narrative   Not on file   Social Determinants of Health   Financial Resource Strain: Not on file  Food Insecurity: Not on file  Transportation Needs: Not on file  Physical Activity: Not on file  Stress: Not on file  Social Connections: Not on file  Intimate Partner Violence: Not on file      Review of Systems  Constitutional:  Negative for chills, fatigue and unexpected weight change.  HENT:  Negative for congestion, postnasal drip, rhinorrhea, sneezing and sore throat.   Eyes:  Negative for redness.  Respiratory:   Negative for cough, chest tightness and shortness of breath.   Cardiovascular:  Negative for chest pain and palpitations.  Gastrointestinal:  Negative for abdominal pain, constipation, diarrhea, nausea and vomiting.  Genitourinary:  Negative for dysuria and frequency.  Musculoskeletal:  Negative for arthralgias, back pain, joint swelling and neck pain.  Skin:  Negative for rash.  Neurological: Negative.  Negative for tremors and numbness.  Hematological:  Negative for adenopathy. Does not bruise/bleed easily.  Psychiatric/Behavioral:  Negative for behavioral problems (Depression), sleep disturbance and suicidal ideas. The patient is not nervous/anxious.  Vital Signs: BP 134/60 Comment: 144/68  Pulse (!) 55   Temp 98.3 F (36.8 C)   Resp 16   Ht _0  (1.727 m)   Wt 225 lb 12.8 oz (102.4 kg)   SpO2 98%   BMI 34.33 kg/m    Physical Exam Vitals and nursing note reviewed.  Constitutional:      Appearance: Normal appearance. He is obese.  HENT:     Head: Normocephalic and atraumatic.     Nose: Nose normal.     Mouth/Throat:     Mouth: Mucous membranes are moist.     Pharynx: No posterior oropharyngeal erythema.  Eyes:     Extraocular Movements: Extraocular movements intact.     Pupils: Pupils are equal, round, and reactive to light.  Cardiovascular:     Rate and Rhythm: Regular rhythm. Bradycardia present.     Pulses: Normal pulses.     Heart sounds: Normal heart sounds.  Pulmonary:     Effort: Pulmonary effort is normal.     Breath sounds: Normal breath sounds.  Skin:    General: Skin is warm and dry.  Neurological:     General: No focal deficit present.     Mental Status: He is alert.  Psychiatric:        Mood and Affect: Mood normal.        Behavior: Behavior normal.        Assessment/Plan: 1. Type 2 diabetes mellitus with other circulatory complication, with long-term current use of insulin (HCC) - POCT HgB A1C is 8.0 which is up from 7.2 last check. Will  increase to 30units of long acting basaglar and continue sliding scale as well as oral medications as before. Continue to work on diet and exercise. - glipiZIDE (GLUCOTROL XL) 5 MG 24 hr tablet; Take 1 tablet (5 mg total) by mouth daily with breakfast.  Dispense: 90 tablet; Refill: 1  2. Benign hypertension Stable, continue current medication  3. Bradycardia Possible due to metoprolol, but BP well controlled and denies symptoms--continue to monitor   General Counseling: demond shallenberger understanding of the findings of todays visit and agrees with plan of treatment. I have discussed any further diagnostic evaluation that may be needed or ordered today. We also reviewed his medications today. he has been encouraged to call the office with any questions or concerns that should arise related to todays visit.    Orders Placed This Encounter  Procedures   POCT HgB A1C    Meds ordered this encounter  Medications   glipiZIDE (GLUCOTROL XL) 5 MG 24 hr tablet    Sig: Take 1 tablet (5 mg total) by mouth daily with breakfast.    Dispense:  90 tablet    Refill:  1   Insulin Syringe-Needle U-100 (INSULIN SYRINGE 1CC/31GX5/16") 31G X 5/16" 1 ML MISC    Sig: Use as directed with insulin    Dispense:  100 each    Refill:  3    Patient requests 1.34m    This patient was seen by LDrema Dallas PA-C in collaboration with Dr. FClayborn Bignessas a part of collaborative care agreement.   Total time spent:30 Minutes Time spent includes review of chart, medications, test results, and follow up plan with the patient.      Dr FLavera GuiseInternal medicine

## 2021-08-25 ENCOUNTER — Telehealth: Payer: Self-pay | Admitting: *Deleted

## 2021-09-04 ENCOUNTER — Other Ambulatory Visit: Payer: Self-pay

## 2021-09-04 MED ORDER — FREESTYLE LIBRE 14 DAY SENSOR MISC: 11 refills | Status: DC

## 2021-09-14 ENCOUNTER — Other Ambulatory Visit: Payer: Self-pay | Admitting: *Deleted

## 2021-09-14 NOTE — Patient Outreach (Signed)
  Care Coordination   Initial Visit Note   09/14/2021 Name: Perry Brucato MRN: [id number] DOB: 08/20/1940  Vyom Brass is a 81 y.o. year old male who sees McDonough, Si Gaul, PA-C for primary care. I spoke with  Valeria Batman by phone today to  discuss [facility] program. Patient declined services explaining that he is well supported by his current health care team.  Follow up plan: No further intervention required.  Encounter Outcome:  Pt. Visit Completed  Emelia Loron RN, BSN [place] [phone] [email or web address]

## 2021-09-25 ENCOUNTER — Telehealth: Payer: Self-pay

## 2021-09-25 NOTE — Telephone Encounter (Signed)
PA for freestyle Libre 14 day sensor sent 09-25-21 @ 350 pm

## 2021-10-05 ENCOUNTER — Encounter: Payer: Self-pay | Admitting: Internal Medicine

## 2021-10-05 ENCOUNTER — Ambulatory Visit (INDEPENDENT_AMBULATORY_CARE_PROVIDER_SITE_OTHER): Payer: Medicare Other | Admitting: Internal Medicine

## 2021-10-05 VITALS — BP 140/60 | HR 48 | Temp 98.1°F | Resp 16 | Ht 68.0 in | Wt 228.0 lb

## 2021-10-05 DIAGNOSIS — R001 Bradycardia, unspecified: Secondary | ICD-10-CM | POA: Diagnosis not present

## 2021-10-05 DIAGNOSIS — Z794 Long term (current) use of insulin: Secondary | ICD-10-CM

## 2021-10-05 DIAGNOSIS — Z125 Encounter for screening for malignant neoplasm of prostate: Secondary | ICD-10-CM

## 2021-10-05 DIAGNOSIS — E1159 Type 2 diabetes mellitus with other circulatory complications: Secondary | ICD-10-CM

## 2021-10-05 DIAGNOSIS — I10 Essential (primary) hypertension: Secondary | ICD-10-CM

## 2021-10-05 DIAGNOSIS — E039 Hypothyroidism, unspecified: Secondary | ICD-10-CM | POA: Diagnosis not present

## 2021-10-05 NOTE — Progress Notes (Signed)
[facility] [place], [place] [address]  Internal MEDICINE  Office Visit Note  Patient Name: Allen [name]  [id number]  [id number]  Date of Service: 10/05/2021  Chief Complaint  Patient presents with   Diabetes    6 week f/u    HPI Patient is here for routine follow-up on his diabetes, last hemoglobin A1c was 8 which was worse than the previous few visits, patient is on multiple medications for diabetic control, however he does titrate his insulin on a sliding scale, he did have 1 episode of severe hypoglycemia last night where his blood sugar drops below 40. 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 Patient is also on Valsartan however her dose cannot be titrated up due to associated hyperkalemia    Current Medication: Outpatient Encounter Medications as of 10/05/2021  Medication Sig   aspirin 81 MG chewable tablet Chew by mouth daily.   b complex vitamins tablet Take 1 tablet by mouth daily.   B-D ULTRA-FINE 33 LANCETS MISC Use 1 Lancet 3 (three) times daily.   cetirizine (ZYRTEC) 10 MG tablet Take 10 mg by mouth daily.   Cholecalciferol (VITAMIN D3) 1000 units CAPS Take by mouth.   Continuous Blood Gluc Sensor (FREESTYLE LIBRE 14 DAY SENSOR) MISC Use 1 kit every 14 (fourteen) days   Continuous Blood Gluc Sensor (FREESTYLE LIBRE SENSOR SYSTEM) MISC Use 3 each every 10 (ten) days.   empagliflozin (JARDIANCE) 25 MG TABS tablet Take 1 tablet (25 mg total) by mouth daily before breakfast.   ezetimibe-simvastatin (VYTORIN) 10-40 MG tablet TAKE 1 TABLET BY MOUTH DAILY   folic acid (FOLVITE) 237 MCG tablet Take 400 mcg by mouth daily.   Garlic 6283 MG CAPS Take by mouth.   glucose blood test strip Check blood sugar 5x a day as directed   insulin aspart (NOVOLOG) 100 UNIT/ML FlexPen INJECT 10 UNITS UNDER SKIN TWICE DAILY BEFORE MORNING AND EVENING MEAL FOR DIABETES.DISCARD PEN 28 DAYS AFTER OPENING PLUS SLIDING SCALE   Insulin Glargine  (BASAGLAR KWIKPEN) 100 UNIT/ML Inject 26 to 40 units once a day sq   Insulin Pen Needle 32G X 4 MM MISC USE AS DIRECTED FOUR TIMES A DAY WITH LEVEMIR FLEXTOUCH AND HUMALOG PEN.  DX CODE E11.29   Insulin Syringe-Needle U-100 (INSULIN SYRINGE 1CC/31GX5/16") 31G X 5/16" 1 ML MISC Use as directed with insulin   Insulin Syringes, Disposable, U-100 1 ML MISC by Does not apply route.   levothyroxine (SYNTHROID) 75 MCG tablet Take 1 tablet (75 mcg total) by mouth daily before breakfast.   metFORMIN (GLUCOPHAGE) 500 MG tablet TAKE 1 TABLET(500 MG) BY MOUTH TWICE DAILY WITH A MEAL   metoprolol succinate (TOPROL-XL) 25 MG 24 hr tablet TAKE 1 TABLET(25 MG) BY MOUTH DAILY   Multiple Vitamin (MULTIVITAMIN WITH MINERALS) TABS tablet Take 1 tablet by mouth daily.   nystatin ointment (MYCOSTATIN) Apply 1 application topically 3 (three) times daily.   traMADol (ULTRAM) 50 MG tablet Take 100 mg by mouth daily.    valsartan (DIOVAN) 160 MG tablet Take 1 tablet (160 mg total) by mouth daily.   vitamin B-12 (CYANOCOBALAMIN) 1000 MCG tablet Take 1,000 mcg by mouth daily.   [DISCONTINUED] glipiZIDE (GLUCOTROL XL) 5 MG 24 hr tablet Take 1 tablet (5 mg total) by mouth daily with breakfast.   No facility-administered encounter medications on file as of 10/05/2021.    Surgical History: Past Surgical History:  Procedure Laterality Date   [place]  CATHETERIZATION  1990   GALLBLADDER SURGERY     LEG SURGERY Right    stent placement    Medical History: Past Medical History:  Diagnosis Date   Coronary artery disease    a. 1989 s/p MI w/ PTCA to unknown vessel. Reports cath 6 months later with occluded vessel and collaterals--> medically managed since..   Diabetes mellitus without complication ([place])    Hyperlipidemia LDL goal <70    Hypertension    a. 02/2020 nl renal artery duplex.   MI (myocardial infarction) ([place])    PAD (peripheral artery disease) ([place])    a. 12/2019 ABIs: R  1.02, L 0.56-->unchanged.   Sleep apnea     Family History: Family History  Problem Relation Age of Onset   Diabetes Mother    Brain cancer Mother    Throat cancer Father    Prostate cancer Brother     Social History   Socioeconomic History   Marital status: Married    Spouse name: Not on file   Number of children: Not on file   Years of education: Not on file   Highest education level: Not on file  Occupational History   Not on file  Tobacco Use   Smoking status: Former    Packs/day: 3.00    Years: 32.00    Total pack years: 96.00    Types: Cigarettes   Smokeless tobacco: Never  Vaping Use   Vaping Use: Never used  Substance and Sexual Activity   Alcohol use: No   Drug use: No   Sexual activity: Not on file  Other Topics Concern   Not on file  Social History Narrative   Not on file   Social Determinants of Health   Financial Resource Strain: Not on file  Food Insecurity: Not on file  Transportation Needs: Not on file  Physical Activity: Not on file  Stress: Not on file  Social Connections: Not on file  Intimate Partner Violence: Not on file      Review of Systems  Constitutional:  Negative for fatigue and fever.  HENT:  Negative for congestion, mouth sores and postnasal drip.   Respiratory:  Negative for cough.   Cardiovascular:  Negative for chest pain.  Endocrine:       Hypoglycemic event   Genitourinary:  Negative for flank pain.  Musculoskeletal:  Positive for arthralgias.  Psychiatric/Behavioral: Negative.      Vital Signs: BP (!) 140/60   Pulse (!) 48   Temp 98.1 F (36.7 C)   Resp 16   Ht _0  (1.727 m)   Wt 228 lb (103.4 kg)   SpO2 98%   BMI 34.67 kg/m    Physical Exam Constitutional:      Appearance: Normal appearance.  HENT:     Head: Normocephalic and atraumatic.     Nose: Nose normal.     Mouth/Throat:     Mouth: Mucous membranes are moist.     Pharynx: No posterior oropharyngeal erythema.  Eyes:     Extraocular  Movements: Extraocular movements intact.     Pupils: Pupils are equal, round, and reactive to light.  Cardiovascular:     Rate and Rhythm: Bradycardia present.     Pulses: Normal pulses.     Heart sounds: Normal heart sounds.  Pulmonary:     Effort: Pulmonary effort is normal.     Breath sounds: Normal breath sounds.  Neurological:     General: No focal deficit present.  Mental Status: He is alert.  Psychiatric:        Mood and Affect: Mood normal.        Behavior: Behavior normal.        Assessment/Plan: 1. Type 2 diabetes mellitus with other circulatory complication, with long-term current use of insulin (HCC) Glipizide is stopped and risk of hypoglycemia, he will continue to take Basaglar and NovoLog, uses sliding scale has arranged to follow and is very good about it, continue metformin as before continue Jardiance as before  2. Bradycardia On manual examination his heart rate is 60 we will continue to monitor  3. Essential hypertension Patient instructed to continue Valsartan and toprol as before  4. Hypothyroidism, unspecified type His dose was just in the past we will repeat TSH and free T4 and adjust medication accordingly  5. Special screening for malignant neoplasm of prostate - PSA Total (Reflex To Free)   General Counseling: auguste tebbetts understanding of the findings of todays visit and agrees with plan of treatment. I have discussed any further diagnostic evaluation that may be needed or ordered today. We also reviewed his medications today. he has been encouraged to call the office with any questions or concerns that should arise related to todays visit.  All labs are ordered today coming annual wellness exam  Orders Placed This Encounter  Procedures   CBC with Differential/Platelet   Lipid Panel With LDL/HDL Ratio   TSH   T4, free   Comprehensive metabolic panel   PSA Total (Reflex To Free)    No orders of the defined types were placed in this  encounter.   Total time spent:35 Minutes Time spent includes review of chart, medications, test results, and follow up plan with the patient.   [place] Controlled Substance Database was reviewed by me.   Dr Lavera Guise Internal medicine

## 2021-10-28 ENCOUNTER — Other Ambulatory Visit: Payer: Self-pay | Admitting: Internal Medicine

## 2021-12-10 ENCOUNTER — Ambulatory Visit (INDEPENDENT_AMBULATORY_CARE_PROVIDER_SITE_OTHER): Payer: Medicare Other | Admitting: Physician Assistant

## 2021-12-10 ENCOUNTER — Encounter: Payer: Self-pay | Admitting: Physician Assistant

## 2021-12-10 VITALS — BP 138/62 | HR 61 | Temp 97.6°F | Resp 16 | Ht 68.0 in | Wt 224.0 lb

## 2021-12-10 DIAGNOSIS — Z794 Long term (current) use of insulin: Secondary | ICD-10-CM

## 2021-12-10 DIAGNOSIS — R300 Dysuria: Secondary | ICD-10-CM

## 2021-12-10 DIAGNOSIS — Z0001 Encounter for general adult medical examination with abnormal findings: Secondary | ICD-10-CM

## 2021-12-10 DIAGNOSIS — E1159 Type 2 diabetes mellitus with other circulatory complications: Secondary | ICD-10-CM

## 2021-12-10 DIAGNOSIS — I10 Essential (primary) hypertension: Secondary | ICD-10-CM | POA: Diagnosis not present

## 2021-12-10 LAB — POCT GLYCOSYLATED HEMOGLOBIN (HGB A1C): Hemoglobin A1C: 8.2 % — AB (ref 4.0–5.6)

## 2021-12-10 NOTE — Progress Notes (Signed)
[facility] [place], [place] [address]  Internal MEDICINE  Office Visit Note  Patient Name: Allen [name]  [id number]  [id number]  Date of Service: 12/10/2021  Chief Complaint  Patient presents with   Diabetes   Hypertension   Hyperlipidemia     HPI Pt is here for routine health maintenance examination -BG fluctuating, some low readings to 40 at night despite stopping glipizide. He had some candy that night too and still dropped. -Basaglar 32units in Am -Novalog set dosing plus Sliding scale at meals 20-26 units, but usually only eats 1-2 meals per day -[place] end[facility]ology mentioned taking over care for diabetes since he gets his insulin through the [place] and might be able to get freestyle via them as well if they were his managing provider. With such fluctuations in sugars as well would benefit from re-establishing with endocrinology as he previously was followed by one. Patient in agreement with plan. In the meantime will have him split up his metformin to BID rather than taking both tabs at night when lows tend to occur. -Will also have fasting labs done to monitor kidney function -foot exam performed dry, fungal, no sensation, always wears shoes   Current Medication: Outpatient Encounter Medications as of 12/10/2021  Medication Sig   aspirin 81 MG chewable tablet Chew by mouth daily.   b complex vitamins tablet Take 1 tablet by mouth daily.   B-D ULTRA-FINE 33 LANCETS MISC Use 1 Lancet 3 (three) times daily.   cetirizine (ZYRTEC) 10 MG tablet Take 10 mg by mouth daily.   Cholecalciferol (VITAMIN D3) 1000 units CAPS Take by mouth.   Continuous Blood Gluc Sensor (FREESTYLE LIBRE 14 DAY SENSOR) MISC Use 1 kit every 14 (fourteen) days   Continuous Blood Gluc Sensor (FREESTYLE LIBRE SENSOR SYSTEM) MISC Use 3 each every 10 (ten) days.   empagliflozin (JARDIANCE) 25 MG TABS tablet Take 1 tablet (25 mg total) by mouth daily before breakfast.    ezetimibe-simvastatin (VYTORIN) 10-40 MG tablet TAKE 1 TABLET BY MOUTH DAILY   folic acid (FOLVITE) 423 MCG tablet Take 400 mcg by mouth daily.   Garlic 5361 MG CAPS Take by mouth.   glucose blood test strip Check blood sugar 5x a day as directed   insulin aspart (NOVOLOG) 100 UNIT/ML FlexPen INJECT 10 UNITS UNDER SKIN TWICE DAILY BEFORE MORNING AND EVENING MEAL FOR DIABETES.DISCARD PEN 28 DAYS AFTER OPENING PLUS SLIDING SCALE   Insulin Glargine (BASAGLAR KWIKPEN) 100 UNIT/ML INJECT 26 TO 40 UNITS UNDER THE SKIN ONCE DAILY   Insulin Pen Needle 32G X 4 MM MISC USE AS DIRECTED FOUR TIMES A DAY WITH LEVEMIR FLEXTOUCH AND HUMALOG PEN.  DX CODE E11.29   Insulin Syringe-Needle U-100 (INSULIN SYRINGE 1CC/31GX5/16") 31G X 5/16" 1 ML MISC Use as directed with insulin   Insulin Syringes, Disposable, U-100 1 ML MISC by Does not apply route.   levothyroxine (SYNTHROID) 75 MCG tablet Take 1 tablet (75 mcg total) by mouth daily before breakfast.   metFORMIN (GLUCOPHAGE) 500 MG tablet TAKE 1 TABLET(500 MG) BY MOUTH TWICE DAILY WITH A MEAL   metoprolol succinate (TOPROL-XL) 25 MG 24 hr tablet TAKE 1 TABLET(25 MG) BY MOUTH DAILY   Multiple Vitamin (MULTIVITAMIN WITH MINERALS) TABS tablet Take 1 tablet by mouth daily.   nystatin ointment (MYCOSTATIN) Apply 1 application topically 3 (three) times daily.   traMADol (ULTRAM) 50 MG tablet Take 100 mg by mouth daily.    valsartan (DIOVAN) 160 MG tablet Take 1 tablet (  160 mg total) by mouth daily.   vitamin B-12 (CYANOCOBALAMIN) 1000 MCG tablet Take 1,000 mcg by mouth daily.   No facility-administered encounter medications on file as of 12/10/2021.    Surgical History: Past Surgical History:  Procedure Laterality Date   [place]   GALLBLADDER SURGERY     LEG SURGERY Right    stent placement    Medical History: Past Medical History:  Diagnosis Date   Coronary artery disease    a. 1989 s/p MI w/ PTCA to  unknown vessel. Reports cath 6 months later with occluded vessel and collaterals--> medically managed since..   Diabetes mellitus without complication ([place])    Hyperlipidemia LDL goal <70    Hypertension    a. 02/2020 nl renal artery duplex.   MI (myocardial infarction) ([place])    PAD (peripheral artery disease) ([place])    a. 12/2019 ABIs: R 1.02, L 0.56-->unchanged.   Sleep apnea     Family History: Family History  Problem Relation Age of Onset   Diabetes Mother    Brain cancer Mother    Throat cancer Father    Prostate cancer Brother       Review of Systems  Constitutional:  Negative for chills, fatigue and unexpected weight change.  HENT:  Negative for congestion, postnasal drip, rhinorrhea, sneezing and sore throat.   Eyes:  Negative for redness.  Respiratory:  Negative for cough, chest tightness and shortness of breath.   Cardiovascular:  Negative for chest pain and palpitations.  Gastrointestinal:  Negative for abdominal pain, constipation, diarrhea, nausea and vomiting.  Genitourinary:  Negative for dysuria and frequency.  Musculoskeletal:  Positive for gait problem. Negative for arthralgias, back pain, joint swelling and neck pain.  Skin:  Negative for rash.  Neurological:  Negative for tremors and numbness.  Hematological:  Negative for adenopathy. Does not bruise/bleed easily.  Psychiatric/Behavioral:  Negative for behavioral problems (Depression), sleep disturbance and suicidal ideas. The patient is not nervous/anxious.      Vital Signs: BP 138/62 Comment: 120/101  Pulse 61   Temp 97.6 F (36.4 C)   Resp 16   Ht _0  (1.727 m)   Wt 224 lb (101.6 kg)   SpO2 97%   BMI 34.06 kg/m    Physical Exam Constitutional:      Appearance: Normal appearance.  HENT:     Head: Normocephalic and atraumatic.     Nose: Nose normal.     Mouth/Throat:     Mouth: Mucous membranes are moist.     Pharynx: No posterior oropharyngeal erythema.  Eyes:     Extraocular  Movements: Extraocular movements intact.     Pupils: Pupils are equal, round, and reactive to light.  Cardiovascular:     Rate and Rhythm: Normal rate and regular rhythm.     Pulses: Normal pulses.     Heart sounds: Normal heart sounds.  Pulmonary:     Effort: Pulmonary effort is normal.     Breath sounds: Normal breath sounds.  Abdominal:     General: Abdomen is flat.     Palpations: Abdomen is soft.     Tenderness: There is no abdominal tenderness.  Feet:     Right foot:     Protective Sensation: 4 sites tested.  0 sites sensed.     Skin integrity: Dry skin present.     Toenail Condition: Right toenails are abnormally thick. Fungal disease present.    Left foot:  Protective Sensation: 4 sites tested.  0 sites sensed.     Skin integrity: Dry skin present.     Toenail Condition: Left toenails are abnormally thick. Fungal disease present. Skin:    General: Skin is warm and dry.  Neurological:     General: No focal deficit present.     Mental Status: He is alert.  Psychiatric:        Mood and Affect: Mood normal.        Behavior: Behavior normal.        Thought Content: Thought content normal.        Judgment: Judgment normal.      LABS: Recent Results (from the past 2160 hour(s))  POCT HgB A1C     Status: Abnormal   Collection Time: 12/10/21  2:13 PM  Result Value Ref Range   Hemoglobin A1C 8.2 (A) 4.0 - 5.6 %   HbA1c POC (<> result, manual entry)     HbA1c, POC (prediabetic range)     HbA1c, POC (controlled diabetic range)          Assessment/Plan: 1. Encounter for general adult medical examination with abnormal findings CPE performed, will have labs done  2. Type 2 diabetes mellitus with other circulatory complication, with long-term current use of insulin (HCC) - Urine Microalbumin w/creat. ratio - POCT HgB A1C is 8.2 which is increased from 8.0 last visit, but is having some hypoglycemic events at night. Will have his adjust metformin to BID rather than  combining together in evening as he has been. Patient is also going to re-establish with endocrinology for his diabetic management and is doing so via the [place] as they have previously mentioned to him  3. Essential hypertension Stable, continue current medications  4. Dysuria - UA/M w/rflx Culture, Routine   General Counseling: amad mau understanding of the findings of todays visit and agrees with plan of treatment. I have discussed any further diagnostic evaluation that may be needed or ordered today. We also reviewed his medications today. he has been encouraged to call the office with any questions or concerns that should arise related to todays visit.    Counseling:    Orders Placed This Encounter  Procedures   UA/M w/rflx Culture, Routine   Urine Microalbumin w/creat. ratio   POCT HgB A1C    No orders of the defined types were placed in this encounter.   This patient was seen by Drema Dallas, PA-C in collaboration with Dr. Clayborn Bigness as a part of collaborative care agreement.  Total time spent:35 Minutes  Time spent includes review of chart, medications, test results, and follow up plan with the patient.     Lavera Guise, MD  Internal Medicine

## 2021-12-11 DIAGNOSIS — Z125 Encounter for screening for malignant neoplasm of prostate: Secondary | ICD-10-CM | POA: Diagnosis not present

## 2021-12-11 DIAGNOSIS — R001 Bradycardia, unspecified: Secondary | ICD-10-CM | POA: Diagnosis not present

## 2021-12-11 DIAGNOSIS — E039 Hypothyroidism, unspecified: Secondary | ICD-10-CM | POA: Diagnosis not present

## 2021-12-11 DIAGNOSIS — I10 Essential (primary) hypertension: Secondary | ICD-10-CM | POA: Diagnosis not present

## 2021-12-11 DIAGNOSIS — E1159 Type 2 diabetes mellitus with other circulatory complications: Secondary | ICD-10-CM | POA: Diagnosis not present

## 2021-12-11 DIAGNOSIS — Z794 Long term (current) use of insulin: Secondary | ICD-10-CM | POA: Diagnosis not present

## 2021-12-12 LAB — CBC WITH DIFFERENTIAL/PLATELET
Basophils Absolute: 0 10*3/uL (ref 0.0–0.2)
Basos: 1 %
EOS (ABSOLUTE): 0.1 10*3/uL (ref 0.0–0.4)
Eos: 2 %
Hematocrit: 46.2 % (ref 37.5–51.0)
Hemoglobin: 15.2 g/dL (ref 13.0–17.7)
Immature Grans (Abs): 0 10*3/uL (ref 0.0–0.1)
Immature Granulocytes: 0 %
Lymphocytes Absolute: 1.6 10*3/uL (ref 0.7–3.1)
Lymphs: 29 %
MCH: 33.5 pg — ABNORMAL HIGH (ref 26.6–33.0)
MCHC: 32.9 g/dL (ref 31.5–35.7)
MCV: 102 fL — ABNORMAL HIGH (ref 79–97)
Monocytes Absolute: 0.5 10*3/uL (ref 0.1–0.9)
Monocytes: 10 %
Neutrophils Absolute: 3.2 10*3/uL (ref 1.4–7.0)
Neutrophils: 58 %
Platelets: 237 10*3/uL (ref 150–450)
RBC: 4.54 x10E6/uL (ref 4.14–5.80)
RDW: 11.9 % (ref 11.6–15.4)
WBC: 5.6 10*3/uL (ref 3.4–10.8)

## 2021-12-12 LAB — UA/M W/RFLX CULTURE, ROUTINE
Bilirubin, UA: NEGATIVE
Ketones, UA: NEGATIVE
Leukocytes,UA: NEGATIVE
Nitrite, UA: NEGATIVE
Protein,UA: NEGATIVE
RBC, UA: NEGATIVE
Specific Gravity, UA: >=1.03 — AB (ref 1.005–1.030)
Urobilinogen, Ur: 0.2 mg/dL (ref 0.2–1.0)
pH, UA: 5 (ref 5.0–7.5)

## 2021-12-12 LAB — COMPREHENSIVE METABOLIC PANEL
ALT: 18 IU/L (ref 0–44)
AST: 20 IU/L (ref 0–40)
Albumin/Globulin Ratio: 1.6 (ref 1.2–2.2)
Albumin: 4.2 g/dL (ref 3.8–4.8)
Alkaline Phosphatase: 79 IU/L (ref 44–121)
BUN/Creatinine Ratio: 23 (ref 10–24)
BUN: 32 mg/dL — ABNORMAL HIGH (ref 8–27)
Bilirubin Total: 1.2 mg/dL (ref 0.0–1.2)
CO2: 22 mmol/L (ref 20–29)
Calcium: 10 mg/dL (ref 8.6–10.2)
Chloride: 99 mmol/L (ref 96–106)
Creatinine, Ser: 1.4 mg/dL — ABNORMAL HIGH (ref 0.76–1.27)
Globulin, Total: 2.6 g/dL (ref 1.5–4.5)
Glucose: 243 mg/dL — ABNORMAL HIGH (ref 70–99)
Potassium: 5.1 mmol/L (ref 3.5–5.2)
Sodium: 139 mmol/L (ref 134–144)
Total Protein: 6.8 g/dL (ref 6.0–8.5)
eGFR: 51 mL/min/{1.73_m2} — ABNORMAL LOW (ref >59)

## 2021-12-12 LAB — LIPID PANEL WITH LDL/HDL RATIO
Cholesterol, Total: 139 mg/dL (ref 100–199)
HDL: 75 mg/dL (ref >39)
LDL Chol Calc (NIH): 43 mg/dL (ref 0–99)
LDL/HDL Ratio: 0.6 ratio (ref 0.0–3.6)
Triglycerides: 120 mg/dL (ref 0–149)
VLDL Cholesterol Cal: 21 mg/dL (ref 5–40)

## 2021-12-12 LAB — MICROSCOPIC EXAMINATION
Bacteria, UA: NONE SEEN
Casts: NONE SEEN /lpf
Epithelial Cells (non renal): NONE SEEN /hpf (ref 0–10)
RBC, Urine: NONE SEEN /hpf (ref 0–2)

## 2021-12-12 LAB — MICROALBUMIN / CREATININE URINE RATIO
Creatinine, Urine: 84.1 mg/dL
Microalb/Creat Ratio: 7 mg/g creat (ref 0–29)
Microalbumin, Urine: 5.7 ug/mL

## 2021-12-12 LAB — PSA TOTAL (REFLEX TO FREE): Prostate Specific Ag, Serum: 0.7 ng/mL (ref 0.0–4.0)

## 2021-12-12 LAB — T4, FREE: Free T4: 1.61 ng/dL (ref 0.82–1.77)

## 2021-12-12 LAB — TSH: TSH: 2.37 u[IU]/mL (ref 0.450–4.500)

## 2021-12-14 ENCOUNTER — Encounter (INDEPENDENT_AMBULATORY_CARE_PROVIDER_SITE_OTHER): Payer: Self-pay

## 2021-12-24 NOTE — Progress Notes (Signed)
Worsening renal functions.

## 2021-12-28 DIAGNOSIS — Z79899 Other long term (current) drug therapy: Secondary | ICD-10-CM | POA: Diagnosis not present

## 2021-12-28 DIAGNOSIS — M5136 Other intervertebral disc degeneration, lumbar region: Secondary | ICD-10-CM | POA: Diagnosis not present

## 2021-12-28 DIAGNOSIS — M5416 Radiculopathy, lumbar region: Secondary | ICD-10-CM | POA: Diagnosis not present

## 2021-12-28 DIAGNOSIS — M48062 Spinal stenosis, lumbar region with neurogenic claudication: Secondary | ICD-10-CM | POA: Diagnosis not present

## 2021-12-30 ENCOUNTER — Ambulatory Visit (INDEPENDENT_AMBULATORY_CARE_PROVIDER_SITE_OTHER): Payer: Medicare Other

## 2021-12-30 DIAGNOSIS — G4733 Obstructive sleep apnea (adult) (pediatric): Secondary | ICD-10-CM | POA: Diagnosis not present

## 2021-12-30 NOTE — Progress Notes (Signed)
95 percentile pressure 9   95th percentile leak 34.7   apnea index 1.4 /hr  apnea-hypopnea index  3.4 /hr   total days used  >4 hr 90 days  total days used <4 hr 0 days  Total compliance 100 percent  He is doing great no problems or questions at this time  Pt was seen by Claiborne Billings  RRT/RCP  from [facility]

## 2022-01-03 DIAGNOSIS — I70219 Atherosclerosis of native arteries of extremities with intermittent claudication, unspecified extremity: Secondary | ICD-10-CM | POA: Insufficient documentation

## 2022-01-03 NOTE — Progress Notes (Signed)
MRN : [id number]  Allen [name] is a 81 y.o. (02/23/1940) male who presents with chief complaint of check circulation.  History of Present Illness:   The patient returns to the office for followup and review of the noninvasive studies. There have been no interval changes in lower extremity symptoms. No interval shortening of the patient's claudication distance or development of rest pain symptoms. No new ulcers or wounds have occurred since the last visit.   There have been no significant changes to the patient's overall health care.   The patient denies amaurosis fugax or recent TIA symptoms. There are no recent neurological changes noted. The patient denies history of DVT, PE or superficial thrombophlebitis. The patient denies recent episodes of angina or shortness of breath.    ABI Rt=1.01 and Lt=0.55  (previous ABI's Rt=1.20 and Lt=1.05)  No outpatient medications have been marked as taking for the 01/04/22 encounter (Appointment) with Delana Meyer, Dolores Lory, MD.    Past Medical History:  Diagnosis Date   Coronary artery disease    a. 1989 s/p MI w/ PTCA to unknown vessel. Reports cath 6 months later with occluded vessel and collaterals--> medically managed since..   Diabetes mellitus without complication ([place])    Hyperlipidemia LDL goal <70    Hypertension    a. 02/2020 nl renal artery duplex.   MI (myocardial infarction) ([place])    PAD (peripheral artery disease) ([place])    a. 12/2019 ABIs: R 1.02, L 0.56-->unchanged.   Sleep apnea     Past Surgical History:  Procedure Laterality Date   CARDIAC CATHETERIZATION  1989   CARDIAC CATHETERIZATION  1990   GALLBLADDER SURGERY     LEG SURGERY Right    stent placement    Social History Social History   Tobacco Use   Smoking status: Former    Packs/day: 3.00    Years: 32.00    Total pack years: 96.00    Types: Cigarettes   Smokeless tobacco: Never  Vaping Use   Vaping Use: Never used  Substance Use Topics    Alcohol use: No   Drug use: No    Family History Family History  Problem Relation Age of Onset   Diabetes Mother    Brain cancer Mother    Throat cancer Father    Prostate cancer Brother     Allergies  Allergen Reactions   Iodinated Contrast Media Anaphylaxis and Rash    TOLERATED CT CONTRAST ON 01/07/2018 WITH NO ISSUES AFTER 10 MINUTE PRETREATMENT X-Ray contrast dyes- anaphylaxis & rash X-Ray contrast dyes- anaphylaxis & rash  X-Ray contrast dyes- anaphylaxis & rash TOLERATED CT CONTRAST ON 01/07/2018 WITH NO ISSUES AFTER 10 MINUTE PRETREATMENT X-Ray contrast dyes- anaphylaxis & rash X-Ray contrast dyes- anaphylaxis & rash   Iohexol Hives and Shortness Of Breath    Pt states that he had IV dye for a kidney x-ray years ago and after injection of the dye he had hives all over his body, throat swelling, and SOB. Pt states that he had IV dye for a kidney x-ray years ago and after injection of the dye he had hives all over his body, throat swelling, and SOB. Pt states that he had IV dye for a kidney x-ray years ago and after injection of the dye he had hives all over his body, throat swelling, and SOB.   Duloxetine Other (See Comments)    Sick to stomach and chills  Gabapentin Other (See Comments)   Penicillins Other (See Comments)   Other Rash     REVIEW OF SYSTEMS (Negative unless checked)  Constitutional: [] Weight loss  [] Fever  [] Chills Cardiac: [] Chest pain   [] Chest pressure   [] Palpitations   [] Shortness of breath when laying flat   [] Shortness of breath with exertion. Vascular:  [x] Pain in legs with walking   [] Pain in legs at rest  [] History of DVT   [] Phlebitis   [] Swelling in legs   [] Varicose veins   [] Non-healing ulcers Pulmonary:   [] Uses home oxygen   [] Productive cough   [] Hemoptysis   [] Wheeze  [] COPD   [] Asthma Neurologic:  [] Dizziness   [] Seizures   [] History of stroke   [] History of TIA  [] Aphasia   [] Vissual changes   [] Weakness or numbness in arm    [] Weakness or numbness in leg Musculoskeletal:   [] Joint swelling   [] Joint pain   [] Low back pain Hematologic:  [] Easy bruising  [] Easy bleeding   [] Hypercoagulable state   [] Anemic Gastrointestinal:  [] Diarrhea   [] Vomiting  [] Gastroesophageal reflux/heartburn   [] Difficulty swallowing. Genitourinary:  [] Chronic kidney disease   [] Difficult urination  [] Frequent urination   [] Blood in urine Skin:  [] Rashes   [] Ulcers  Psychological:  [] History of anxiety   []  History of major depression.  Physical Examination  There were no vitals filed for this visit. There is no height or weight on file to calculate BMI. Gen: WD/WN, NAD Head: [place]/AT, No temporalis wasting.  Ear/Nose/Throat: Hearing grossly intact, nares w/o erythema or drainage Eyes: PER, EOMI, sclera nonicteric.  Neck: Supple, no masses.  No bruit or JVD.  Pulmonary:  Good air movement, no audible wheezing, no use of accessory muscles.  Cardiac: RRR, normal S1, S2, no Murmurs. Vascular:  mild trophic changes, no open wounds Vessel Right Left  Radial Palpable Palpable  PT Not Palpable Not Palpable  DP Not Palpable Not Palpable  Gastrointestinal: soft, non-distended. No guarding/no peritoneal signs.  Musculoskeletal: M/S 5/5 throughout.  No visible deformity.  Neurologic: CN 2-12 intact. Pain and light touch intact in extremities.  Symmetrical.  Speech is fluent. Motor exam as listed above. Psychiatric: Judgment intact, Mood & affect appropriate for pt's clinical situation. Dermatologic: No rashes or ulcers noted.  No changes consistent with cellulitis.   CBC Lab Results  Component Value Date   WBC 5.6 12/11/2021   HGB 15.2 12/11/2021   HCT 46.2 12/11/2021   MCV 102 (H) 12/11/2021   PLT 237 12/11/2021    BMET    Component Value Date/Time   NA 139 12/11/2021 1150   NA 132 (L) 10/04/2011 0756   K 5.1 12/11/2021 1150   K 4.3 10/04/2011 0756   CL 99 12/11/2021 1150   CL 95 (L) 10/04/2011 0756   CO2 22 12/11/2021 1150    CO2 29 10/04/2011 0756   GLUCOSE 243 (H) 12/11/2021 1150   GLUCOSE 96 07/24/2020 1325   GLUCOSE 182 (H) 10/04/2011 0756   BUN 32 (H) 12/11/2021 1150   BUN 23 (H) 10/04/2011 0756   CREATININE 1.40 (H) 12/11/2021 1150   CREATININE 0.97 10/04/2011 0756   CALCIUM 10.0 12/11/2021 1150   CALCIUM 9.5 10/04/2011 0756   GFRNONAA >60 07/24/2020 1325   GFRAA 59 (L) 02/18/2020 0846   CrCl cannot be calculated (Patient's most recent lab result is older than the maximum 21 days allowed.).  COAG No results found for: "INR", "PROTIME"  Radiology No results found.   Assessment/Plan 1. Atherosclerosis of  native artery of both lower extremities with intermittent claudication (HCC)  Recommend:  The patient has evidence of atherosclerosis of the lower extremities with claudication.  The patient does not voice lifestyle limiting changes at this point in time.  Noninvasive studies do not suggest clinically significant change.  No invasive studies, angiography or surgery at this time The patient should continue walking and begin a more formal exercise program.  The patient should continue antiplatelet therapy and aggressive treatment of the lipid abnormalities  No changes in the patient's medications at this time  Continued surveillance is indicated as atherosclerosis is likely to progress with time.    The patient will continue follow up with noninvasive studies as ordered.  - VAS [place] ABI WITH/WO TBI; Future  2. Essential hypertension Continue antihypertensive medications as already ordered, these medications have been reviewed and there are no changes at this time.  3. Type 2 diabetes mellitus with hyperglycemia, without long-term current use of insulin (HCC) Continue hypoglycemic medications as already ordered, these medications have been reviewed and there are no changes at this time.  Hgb A1C to be monitored as already arranged by primary service  4. Hyperlipidemia, unspecified  hyperlipidemia type Continue statin as ordered and reviewed, no changes at this time  5. PAD (peripheral artery disease) ([place]) See #1 - VAS [place] ABI WITH/WO TBI    Hortencia Pilar, MD  01/03/2022 4:31 PM

## 2022-01-04 ENCOUNTER — Ambulatory Visit (INDEPENDENT_AMBULATORY_CARE_PROVIDER_SITE_OTHER): Payer: Medicare Other | Admitting: Vascular Surgery

## 2022-01-04 ENCOUNTER — Encounter (INDEPENDENT_AMBULATORY_CARE_PROVIDER_SITE_OTHER): Payer: Self-pay | Admitting: Vascular Surgery

## 2022-01-04 ENCOUNTER — Ambulatory Visit (INDEPENDENT_AMBULATORY_CARE_PROVIDER_SITE_OTHER): Payer: Medicare Other

## 2022-01-04 VITALS — BP 136/71 | HR 61 | Resp 16 | Wt 222.0 lb

## 2022-01-04 DIAGNOSIS — I10 Essential (primary) hypertension: Secondary | ICD-10-CM

## 2022-01-04 DIAGNOSIS — E1165 Type 2 diabetes mellitus with hyperglycemia: Secondary | ICD-10-CM

## 2022-01-04 DIAGNOSIS — I739 Peripheral vascular disease, unspecified: Secondary | ICD-10-CM

## 2022-01-04 DIAGNOSIS — E785 Hyperlipidemia, unspecified: Secondary | ICD-10-CM | POA: Diagnosis not present

## 2022-01-04 DIAGNOSIS — I70213 Atherosclerosis of native arteries of extremities with intermittent claudication, bilateral legs: Secondary | ICD-10-CM | POA: Diagnosis not present

## 2022-01-10 ENCOUNTER — Encounter (INDEPENDENT_AMBULATORY_CARE_PROVIDER_SITE_OTHER): Payer: Self-pay | Admitting: Vascular Surgery

## 2022-01-23 ENCOUNTER — Other Ambulatory Visit: Payer: Self-pay

## 2022-01-23 ENCOUNTER — Emergency Department
Admission: EM | Admit: 2022-01-23 | Discharge: 2022-01-23 | Disposition: A | Discharge status: Home / Self Care | Payer: Medicare Other | Attending: Emergency Medicine | Admitting: Emergency Medicine

## 2022-01-23 ENCOUNTER — Emergency Department: Payer: Medicare Other

## 2022-01-23 DIAGNOSIS — I959 Hypotension, unspecified: Secondary | ICD-10-CM | POA: Diagnosis not present

## 2022-01-23 DIAGNOSIS — R55 Syncope and collapse: Secondary | ICD-10-CM | POA: Diagnosis not present

## 2022-01-23 DIAGNOSIS — Z8679 Personal history of other diseases of the circulatory system: Secondary | ICD-10-CM | POA: Diagnosis not present

## 2022-01-23 DIAGNOSIS — I10 Essential (primary) hypertension: Secondary | ICD-10-CM | POA: Insufficient documentation

## 2022-01-23 DIAGNOSIS — E119 Type 2 diabetes mellitus without complications: Secondary | ICD-10-CM | POA: Diagnosis not present

## 2022-01-23 DIAGNOSIS — R531 Weakness: Secondary | ICD-10-CM | POA: Diagnosis present

## 2022-01-23 DIAGNOSIS — R0902 Hypoxemia: Secondary | ICD-10-CM | POA: Diagnosis not present

## 2022-01-23 DIAGNOSIS — I491 Atrial premature depolarization: Secondary | ICD-10-CM | POA: Diagnosis not present

## 2022-01-23 DIAGNOSIS — I252 Old myocardial infarction: Secondary | ICD-10-CM

## 2022-01-23 DIAGNOSIS — R231 Pallor: Secondary | ICD-10-CM | POA: Diagnosis not present

## 2022-01-23 DIAGNOSIS — R42 Dizziness and giddiness: Secondary | ICD-10-CM | POA: Diagnosis not present

## 2022-01-23 LAB — COMPREHENSIVE METABOLIC PANEL
ALT: 19 U/L (ref 0–44)
AST: 22 U/L (ref 15–41)
Albumin: 4 g/dL (ref 3.5–5.0)
Alkaline Phosphatase: 60 U/L (ref 38–126)
Anion gap: 8 (ref 5–15)
BUN: 37 mg/dL — ABNORMAL HIGH (ref 8–23)
CO2: 28 mmol/L (ref 22–32)
Calcium: 9.4 mg/dL (ref 8.9–10.3)
Chloride: 104 mmol/L (ref 98–111)
Creatinine, Ser: 1.41 mg/dL — ABNORMAL HIGH (ref 0.61–1.24)
GFR, Estimated: 51 mL/min — ABNORMAL LOW (ref >60)
Glucose, Bld: 108 mg/dL — ABNORMAL HIGH (ref 70–99)
Potassium: 3.6 mmol/L (ref 3.5–5.1)
Sodium: 140 mmol/L (ref 135–145)
Total Bilirubin: 1 mg/dL (ref 0.3–1.2)
Total Protein: 6.7 g/dL (ref 6.5–8.1)

## 2022-01-23 LAB — URINALYSIS, ROUTINE W REFLEX MICROSCOPIC
Bacteria, UA: NONE SEEN
Bilirubin Urine: NEGATIVE
Glucose, UA: >=500 mg/dL — AB
Hgb urine dipstick: NEGATIVE
Ketones, ur: NEGATIVE mg/dL
Leukocytes,Ua: NEGATIVE
Nitrite: NEGATIVE
Protein, ur: NEGATIVE mg/dL
Specific Gravity, Urine: 1.023 (ref 1.005–1.030)
pH: 6 (ref 5.0–8.0)

## 2022-01-23 LAB — CBC WITH DIFFERENTIAL/PLATELET
Abs Immature Granulocytes: 0.02 10*3/uL (ref 0.00–0.07)
Basophils Absolute: 0 10*3/uL (ref 0.0–0.1)
Basophils Relative: 1 %
Eosinophils Absolute: 0.1 10*3/uL (ref 0.0–0.5)
Eosinophils Relative: 1 %
HCT: 43.4 % (ref 39.0–52.0)
Hemoglobin: 13.9 g/dL (ref 13.0–17.0)
Immature Granulocytes: 0 %
Lymphocytes Relative: 28 %
Lymphs Abs: 1.8 10*3/uL (ref 0.7–4.0)
MCH: 33.7 pg (ref 26.0–34.0)
MCHC: 32 g/dL (ref 30.0–36.0)
MCV: 105.1 fL — ABNORMAL HIGH (ref 80.0–100.0)
Monocytes Absolute: 0.6 10*3/uL (ref 0.1–1.0)
Monocytes Relative: 9 %
Neutro Abs: 4 10*3/uL (ref 1.7–7.7)
Neutrophils Relative %: 61 %
Platelets: 210 10*3/uL (ref 150–400)
RBC: 4.13 MIL/uL — ABNORMAL LOW (ref 4.22–5.81)
RDW: 12.7 % (ref 11.5–15.5)
WBC: 6.4 10*3/uL (ref 4.0–10.5)
nRBC: 0 % (ref 0.0–0.2)

## 2022-01-23 LAB — TROPONIN I (HIGH SENSITIVITY)
Troponin I (High Sensitivity): 6 ng/L (ref <18)
Troponin I (High Sensitivity): 6 ng/L (ref <18)

## 2022-01-23 MED ORDER — SODIUM CHLORIDE 0.9 % IV BOLUS
1000.0000 mL | Freq: Once | INTRAVENOUS | Status: AC
  Administered 2022-01-23: 1000 mL via INTRAVENOUS

## 2022-01-23 NOTE — ED Provider Notes (Signed)
[facility] Provider Note   Event Date/Time   First MD Initiated Contact with Patient 01/23/22 1942     (approximate) History  Weakness  HPI Allen [name] is a 81 y.o. male with a stated past medical history of myocardial infarction, hypertension, and type 2 diabetes who presents via EMS after an episode of feeling weak and "blacking out".  When EMS arrived they noticed patient's systolic blood pressure to be in the 60s and heart rate in the 40s.  They noted that when they laid patient down his symptoms almost immediately improved.  Patient was also given 500 mL bolus of NS prior to arrival and patient's heart rate and blood pressure were in normal limits upon arrival to our emergency department and patient has no complaints. ROS: Patient currently denies any vision changes, tinnitus, difficulty speaking, facial droop, sore throat, chest pain, shortness of breath, abdominal pain, nausea/vomiting/diarrhea, dysuria, or weakness/numbness/paresthesias in any extremity   Physical Exam  Triage Vital Signs: ED Triage Vitals  Enc Vitals Group     BP      Pulse      Resp      Temp      Temp src      SpO2      Weight      Height      Head Circumference      Peak Flow      Pain Score      Pain Loc      Pain Edu?      Excl. in GC?    Most recent vital signs: Vitals:   01/23/22 2028 01/23/22 2322  BP:  (!) 147/47  Pulse:  71  Resp:  14  Temp:  98.9 F (37.2 C)  SpO2: 98% 100%   General: Awake, oriented x4. CV:  Good peripheral perfusion.  Resp:  Normal effort.  Abd:  No distention.  Other:  Elderly overweight Caucasian male laying in bed in no acute distress ED Results / Procedures / Treatments  Labs (all labs ordered are listed, but only abnormal results are displayed) Labs Reviewed  COMPREHENSIVE METABOLIC PANEL - Abnormal; Notable for the following components:      Result Value   Glucose, Bld 108 (*)    BUN 37 (*)    Creatinine, Ser 1.41 (*)    GFR,  Estimated 51 (*)    All other components within normal limits  CBC WITH DIFFERENTIAL/PLATELET - Abnormal; Notable for the following components:   RBC 4.13 (*)    MCV 105.1 (*)    All other components within normal limits  URINALYSIS, ROUTINE W REFLEX MICROSCOPIC - Abnormal; Notable for the following components:   Color, Urine AMBER (*)    APPearance CLOUDY (*)    Glucose, UA >=500 (*)    All other components within normal limits  TROPONIN I (HIGH SENSITIVITY)  TROPONIN I (HIGH SENSITIVITY)   EKG ED ECG REPORT I, Merwyn Katos, the attending physician, personally viewed and interpreted this ECG. Date: 01/23/2022 EKG Time: 1950 Rate: 52 Rhythm: normal sinus rhythm QRS Axis: normal Intervals: normal ST/T Wave abnormalities: normal Narrative Interpretation: no evidence of acute ischemia RADIOLOGY ED MD interpretation: One-view portable chest x-ray interpreted by me shows no evidence of acute abnormalities including no pneumonia, pneumothorax, or widened mediastinum -Agree with radiology assessment Official radiology report(s): DG Chest Port 1 View  Result Date: 01/23/2022 CLINICAL DATA:  Syncopal episode EXAM: PORTABLE CHEST 1 VIEW COMPARISON:  07/15/2020 FINDINGS: Cardiac  shadow is mildly enlarged but stable. Elevation of the right hemidiaphragm is noted. No focal infiltrate or effusion is seen. No bony abnormality is noted. IMPRESSION: No acute abnormality seen. Electronically Signed   By: Alcide Clever M.D.   On: 01/23/2022 20:21   PROCEDURES: Critical Care performed: No .1-3 Lead EKG Interpretation  Performed by: Merwyn Katos, MD Authorized by: Merwyn Katos, MD     Interpretation: normal     ECG rate:  71   ECG rate assessment: normal     Rhythm: sinus rhythm     Ectopy: none     Conduction: normal    MEDICATIONS ORDERED IN ED: Medications  sodium chloride 0.9 % bolus 1,000 mL (0 mLs Intravenous Stopped 01/23/22 2223)   IMPRESSION / MDM / ASSESSMENT AND PLAN / ED  COURSE  I reviewed the triage vital signs and the nursing notes.                             The patient is on the cardiac monitor to evaluate for evidence of arrhythmia and/or significant heart rate changes. Patient's presentation is most consistent with acute presentation with potential threat to life or bodily function. Patient presents with complaints of syncope/presyncope ED Workup:  CBC, BMP, Troponin, BNP, ECG, CXR Differential diagnosis includes HF, ICH, seizure, stroke, HOCM, ACS, aortic dissection, malignant arrhythmia, or GI bleed. Findings: No evidence of acute laboratory abnormalities.  Troponin negative x1 EKG: No e/o STEMI. No evidence of Brugadas sign, delta wave, epsilon wave, significantly prolonged QTc, or malignant arrhythmia.  Disposition: Discharge. Patient is at baseline at this time. Return precautions expressed and understood in person. Advised follow up with primary care provider or clinic physician in next 24 hours. Clinical Course as of 01/24/22 Terie Purser Jan 24, 2022  0021 DG Chest [place] 1 View [EB]    Clinical Course User Index [EB] Vicente Males, Clent Jacks, MD   FINAL CLINICAL IMPRESSION(S) / ED DIAGNOSES   Final diagnoses:  Hx of myocardial infarction  Transient hypotension   Rx / DC Orders   ED Discharge Orders          Ordered    Ambulatory referral to Cardiology       Comments: If you have not heard from the Cardiology office within the next 72 hours please call [phone].   01/23/22 2256           Note:  This document was prepared using Dragon voice recognition software and may include unintentional dictation errors.   Merwyn Katos, MD 01/24/22 [phone]

## 2022-01-23 NOTE — ED Triage Notes (Signed)
At about 1830, the had dinner and started to feel weak, his vision was "blacking out". Pt denies LOC. Wife called EMS. EMS found his heart rate in the 40s, SBP in the 60s. The layed him down and he started to feel better. BG was 100. Pt is a diabetic and has a hx of a MI. EMS started a 573mL bolus, afterwards BP and HR was WNL.

## 2022-01-26 ENCOUNTER — Ambulatory Visit: Payer: Medicare Other | Admitting: Cardiovascular Disease

## 2022-01-27 NOTE — Progress Notes (Signed)
Cardiology Clinic Note   Patient Name: Allen [name] Date of Encounter: 01/29/2022  Primary Care Provider:  [name], PA-C Primary Cardiologist:  [name], MD  Patient Profile   81 year old male with a history of coronary artery disease status post MI in 1989, hypertension, hyperlipidemia, diabetes, obesity, peripheral arterial disease, and sleep apnea, who presents today for follow-up after recent emergency department visit for hypotension, bradycardia, and near-syncope.   Past Medical History   Past Medical History:  Diagnosis Date   Coronary artery disease    a. 1989 s/p MI w/ PTCA to unknown vessel. Reports cath 6 months later with occluded vessel and collaterals--> medically managed since..   Diabetes mellitus without complication ([place])    Hyperlipidemia LDL goal <70    Hypertension    a. 02/2020 nl renal artery duplex.   MI (myocardial infarction) ([place])    PAD (peripheral artery disease) ([place])    a. 12/2019 ABIs: R 1.02, L 0.56-->unchanged.   Sleep apnea    Past Surgical History:  Procedure Laterality Date   [place]   GALLBLADDER SURGERY     LEG SURGERY Right    stent placement    Allergies  Allergies  Allergen Reactions   Iodinated Contrast Media Anaphylaxis and Rash    TOLERATED CT CONTRAST ON 01/07/2018 WITH NO ISSUES AFTER 10 MINUTE PRETREATMENT X-Ray contrast dyes- anaphylaxis & rash X-Ray contrast dyes- anaphylaxis & rash  X-Ray contrast dyes- anaphylaxis & rash TOLERATED CT CONTRAST ON 01/07/2018 WITH NO ISSUES AFTER 10 MINUTE PRETREATMENT X-Ray contrast dyes- anaphylaxis & rash X-Ray contrast dyes- anaphylaxis & rash   Iohexol Hives and Shortness Of Breath    Pt states that he had IV dye for a kidney x-ray years ago and after injection of the dye he had hives all over his body, throat swelling, and SOB. Pt states that he had IV dye for a kidney x-ray years ago and after injection  of the dye he had hives all over his body, throat swelling, and SOB. Pt states that he had IV dye for a kidney x-ray years ago and after injection of the dye he had hives all over his body, throat swelling, and SOB.   Duloxetine Other (See Comments)    Sick to stomach and chills   Gabapentin Other (See Comments)   Penicillins Other (See Comments)   Other Rash    History of Present Illness    Allen [name] is a 81 year old male with a previously mentioned past medical history of coronary artery disease status post MI 43.  He was treated with angioplasty at [facility] but then had repeat catheterization approximately 6 months later, showing occluded vessel with collaterals.  He has been medically managed since.  He has also not had any other ischemic cardiac events.  Continued history includes diabetes, hypertension, hyperlipidemia, obesity, peripheral arterial disease, and sleep apnea on CPAP.  His hospitalization 06/2020 for symptomatic bradycardia.  During his visit heart rate was recorded at 30 bpm.  While in the emergency department he was found to have a heart rate of 59 bpm when he arrived to the emergency department he was completely asymptomatic he was found to have PVCs on EKG.  Echocardiogram completed 09/02/2020 revealed LVEF of 55-60%, no regional wall motion abnormalities, Mild left ventricular hypertrophy, G1 DD, moderate aortic valve sclerosis without significant aortic stenosis.  He was last seen in clinic 09/17/2020 by Dr. Quentin Ore for PVCs and  bradycardia.  Since he remained asymptomatic and the decision was made to do conservative management strategy.  Without the initiation of antiarrhythmic drug therapy or invasive EP studies.  He was recently evaluated in the [facility] emergency department with a chief complaint of weakness.  He had an episode of feeling weak and blacking out.  When EMS arrived and his patient's systolic blood pressure was in the 60s with a heart rate in the 40s.  He was given  a 500 mL bolus of normal saline prior to arrival and his heart rate and blood pressure within normal limits on arrival to the emergency department.  Blood pressure 147/47, pulse of 71, respirations 14, temperature 98.9.  Pertinent labs revealed a blood glucose of 108, BUN 37 serum creatinine 1.41, GFR 51.  Patient returned to baseline and subsequently discharged home with cardiac follow-up.  He returns to clinic today accompanied by his wife.  He states that he is feeling worse today than when he was in the emergency department.  He continues to endorse worsening weakness, fatigue, shortness of breath with dyspnea on exertion and occasional changes to his vision with blurry vision.  He denies any current chest pain.  He states that when EMS arrived at his home with blood pressure was in the 60s and his heart rate was in the 40s.  He stated that he had a similar episode last year with testing that was completed with results unrevealing.  States prior to his episode of having to go to the emergency department he had been within his usual state of health and had noted no changes.  Home Medications    Current Outpatient Medications  Medication Sig Dispense Refill   aspirin 81 MG chewable tablet Chew by mouth daily.     b complex vitamins tablet Take 1 tablet by mouth daily.     B-D ULTRA-FINE 33 LANCETS MISC Use 1 Lancet 3 (three) times daily.     cetirizine (ZYRTEC) 10 MG tablet Take 10 mg by mouth daily.     Cholecalciferol (VITAMIN D3) 1000 units CAPS Take by mouth.     Continuous Blood Gluc Sensor (FREESTYLE LIBRE 14 DAY SENSOR) MISC Use 1 kit every 14 (fourteen) days 2 each 11   Continuous Blood Gluc Sensor (FREESTYLE LIBRE SENSOR SYSTEM) MISC Use 3 each every 10 (ten) days.     empagliflozin (JARDIANCE) 25 MG TABS tablet Take 1 tablet (25 mg total) by mouth daily before breakfast. 90 tablet 3   ezetimibe-simvastatin (VYTORIN) 10-40 MG tablet TAKE 1 TABLET BY MOUTH DAILY 90 tablet 1   folic acid  (FOLVITE) 003 MCG tablet Take 400 mcg by mouth daily.     Garlic 7048 MG CAPS Take by mouth.     glucose blood test strip Check blood sugar 5x a day as directed     insulin aspart (NOVOLOG) 100 UNIT/ML FlexPen INJECT 10 UNITS UNDER SKIN TWICE DAILY BEFORE MORNING AND EVENING MEAL FOR DIABETES.DISCARD PEN 28 DAYS AFTER OPENING PLUS SLIDING SCALE     Insulin Glargine (BASAGLAR KWIKPEN) 100 UNIT/ML INJECT 26 TO 40 UNITS UNDER THE SKIN ONCE DAILY 15 mL 3   Insulin Pen Needle 32G X 4 MM MISC USE AS DIRECTED FOUR TIMES A DAY WITH LEVEMIR FLEXTOUCH AND HUMALOG PEN.  DX CODE E11.29 100 each 3   Insulin Syringe-Needle U-100 (INSULIN SYRINGE 1CC/31GX5/16") 31G X 5/16" 1 ML MISC Use as directed with insulin 100 each 3   Insulin Syringes, Disposable, U-100 1 ML MISC by  Does not apply route.     levothyroxine (SYNTHROID) 75 MCG tablet Take 1 tablet (75 mcg total) by mouth daily before breakfast. 90 tablet 3   metFORMIN (GLUCOPHAGE) 500 MG tablet TAKE 1 TABLET(500 MG) BY MOUTH TWICE DAILY WITH A MEAL 180 tablet 1   metoprolol succinate (TOPROL-XL) 25 MG 24 hr tablet TAKE 1 TABLET(25 MG) BY MOUTH DAILY (Patient taking differently: Take 12.5 mg by mouth daily.) 90 tablet 1   Multiple Vitamin (MULTIVITAMIN WITH MINERALS) TABS tablet Take 1 tablet by mouth daily.     nystatin ointment (MYCOSTATIN) Apply 1 application topically 3 (three) times daily. 30 g 2   traMADol (ULTRAM) 50 MG tablet Take 100 mg by mouth daily.      valsartan (DIOVAN) 160 MG tablet Take 1 tablet (160 mg total) by mouth daily. 90 tablet 3   vitamin B-12 (CYANOCOBALAMIN) 1000 MCG tablet Take 1,000 mcg by mouth daily.     No current facility-administered medications for this visit.     Family History    Family History  Problem Relation Age of Onset   Diabetes Mother    Brain cancer Mother    Throat cancer Father    Prostate cancer Brother    He indicated that his mother is deceased. He indicated that his father is deceased. He indicated  that his brother is deceased.  Social History    Social History   Socioeconomic History   Marital status: Married    Spouse name: Not on file   Number of children: Not on file   Years of education: Not on file   Highest education level: Not on file  Occupational History   Not on file  Tobacco Use   Smoking status: Former    Packs/day: 3.00    Years: 32.00    Total pack years: 96.00    Types: Cigarettes   Smokeless tobacco: Never  Vaping Use   Vaping Use: Never used  Substance and Sexual Activity   Alcohol use: No   Drug use: No   Sexual activity: Not on file  Other Topics Concern   Not on file  Social History Narrative   Not on file   Social Determinants of Health   Financial Resource Strain: Not on file  Food Insecurity: Not on file  Transportation Needs: Not on file  Physical Activity: Not on file  Stress: Not on file  Social Connections: Not on file  Intimate Partner Violence: Not on file     Review of Systems    General:  No chills, fever, night sweats or weight changes. Endorses fatigue Cardiovascular:  No chest pain, endorses dyspnea on exertion, edema, orthopnea, palpitations, paroxysmal nocturnal dyspnea. Dermatological: No rash, lesions/masses Respiratory: No cough, endorses dyspnea Urologic: No hematuria, dysuria Abdominal:   No nausea, vomiting, diarrhea, bright red blood per rectum, melena, or hematemesis Neurologic:  Endorses visual changes, endorses weakness, changes in mental status. All other systems reviewed and are otherwise negative except as noted above.   Physical Exam    VS:  BP (!) 136/58 (BP Location: Left Arm, Patient Position: Sitting, Cuff Size: Normal)   Pulse 63   Ht _0  (1.727 m)   Wt 222 lb 12.8 oz (101.1 kg)   SpO2 96%   BMI 33.88 kg/m  , BMI Body mass index is 33.88 kg/m.     GEN: Well nourished, well developed, in no acute distress. HEENT: normal. Neck: Supple, no JVD, carotid bruits, or masses. Cardiac: RRR,  II/VI  murmur, without rubs, or gallops. No clubbing, cyanosis, trace bilateral ankle edema.  Radials 2+/PT 1+ and equal bilaterally.  Respiratory:  Respirations regular and unlabored, clear to auscultation bilaterally. GI: Soft, nontender, nondistended, BS + x 4. MS: no deformity or atrophy. Skin: warm and dry, no rash. Neuro:  Strength and sensation are intact. Psych: Normal affect.  Accessory Clinical Findings    ECG personally reviewed by me today-sinus rhythm with sinus arrhythmia, first-degree AV block, unifocal PVCs with artifact also noted- No acute changes  Lab Results  Component Value Date   WBC 6.4 01/23/2022   HGB 13.9 01/23/2022   HCT 43.4 01/23/2022   MCV 105.1 (H) 01/23/2022   PLT 210 01/23/2022   Lab Results  Component Value Date   CREATININE 1.41 (H) 01/23/2022   BUN 37 (H) 01/23/2022   NA 140 01/23/2022   K 3.6 01/23/2022   CL 104 01/23/2022   CO2 28 01/23/2022   Lab Results  Component Value Date   ALT 19 01/23/2022   AST 22 01/23/2022   GGT 74 (H) 01/08/2018   ALKPHOS 60 01/23/2022   BILITOT 1.0 01/23/2022   Lab Results  Component Value Date   CHOL 139 12/11/2021   HDL 75 12/11/2021   LDLCALC 43 12/11/2021   TRIG 120 12/11/2021   CHOLHDL 1.9 12/27/2016    Lab Results  Component Value Date   HGBA1C 8.2 (A) 12/10/2021    Assessment & Plan   1.  Abnormal EKG with bradycardia and PVCs.  He was recently evaluated in the emergency department after calling EMS.  Once found by EMS his heart rate was in the 40s and his blood pressure was in the 19F systolic and he was transported to the [facility] emergency department.  He was given 500 mL of normal saline and route to the emergency department and upon arrival his blood pressure and heart rate had improved.  So he is being placed on a Holter monitor for 2 weeks after return of his Holter monitor will likely need to continue with close follow-up with EP for treatment of symptomatic PVCs.  2.  Coronary artery  disease status post myocardial infarction in 1989 with PTCA at the time but subsequent cath revealing an occluded vessel with good collaterals.  He has been medically managed since that time.  Has atypical complaints of worsening shortness of breath, dyspnea on exertion and weakness could be anginal equivalents.  With his EKG with PVCs will look for ischemic [place].  He has been scheduled for a [employer].  He continues on aspirin, low-dose beta-blocker, and Vytorin.  3.  Near syncope  with continued weakness and blurry vision recently evaluated in the emergency department.  He has been scheduled for a carotid duplex and CO XT monitor.  4.  Essential hypertension with blood pressure today 136/58.  Patient notes this is typical readings for him.  He did say that when EMS arrived at his home systolically he was in the 60s and had received fluid.  He is continued on valsartan 160 mg daily, Toprol 12.5 mg twice daily, and encouraged to continue to monitor his pressures at home.  5.  Hyperlipidemia on Vytorin with LDL of 43 12/11/21 which remains at goal less than 70. Continues to managed by his PCP.  6.  Peripheral arterial disease with recent right ABI of 1.01 and left 0.5 by (previous ABI's right = 1.20 on the left= 1.05). Continues to be followed by [facility].  7.  Type 2  diabetes with last hemoglobin A1c of 8.2 on 12/10/2021.  He is continued on an insulin and jardiance . This continues to be managed by his PCP.  8.  Obstructive sleep apnea continues to be compliant with CPAP.  9.  Patient return to clinic to see MD/APP after testing is completed or sooner if needed.  All this information was also conveyed to the patient's daughter Judeen Hammans via telephone during his visit with he and his wife present.  Makalia Bare, NP 01/29/2022, 10:41 AM

## 2022-01-28 ENCOUNTER — Ambulatory Visit (INDEPENDENT_AMBULATORY_CARE_PROVIDER_SITE_OTHER): Payer: Medicare Other

## 2022-01-28 ENCOUNTER — Ambulatory Visit: Payer: Medicare Other | Attending: Cardiovascular Disease | Admitting: Cardiology

## 2022-01-28 ENCOUNTER — Encounter: Payer: Self-pay | Admitting: Cardiology

## 2022-01-28 VITALS — BP 136/58 | HR 63 | Ht 68.0 in | Wt 222.8 lb

## 2022-01-28 DIAGNOSIS — E785 Hyperlipidemia, unspecified: Secondary | ICD-10-CM

## 2022-01-28 DIAGNOSIS — R55 Syncope and collapse: Secondary | ICD-10-CM

## 2022-01-28 DIAGNOSIS — R0609 Other forms of dyspnea: Secondary | ICD-10-CM | POA: Diagnosis not present

## 2022-01-28 DIAGNOSIS — G4733 Obstructive sleep apnea (adult) (pediatric): Secondary | ICD-10-CM | POA: Diagnosis not present

## 2022-01-28 DIAGNOSIS — R072 Precordial pain: Secondary | ICD-10-CM | POA: Diagnosis not present

## 2022-01-28 DIAGNOSIS — R001 Bradycardia, unspecified: Secondary | ICD-10-CM | POA: Diagnosis not present

## 2022-01-28 DIAGNOSIS — I739 Peripheral vascular disease, unspecified: Secondary | ICD-10-CM

## 2022-01-28 DIAGNOSIS — I70213 Atherosclerosis of native arteries of extremities with intermittent claudication, bilateral legs: Secondary | ICD-10-CM

## 2022-01-28 DIAGNOSIS — I493 Ventricular premature depolarization: Secondary | ICD-10-CM

## 2022-01-28 DIAGNOSIS — I2510 Atherosclerotic heart disease of native coronary artery without angina pectoris: Secondary | ICD-10-CM

## 2022-01-28 DIAGNOSIS — I10 Essential (primary) hypertension: Secondary | ICD-10-CM | POA: Diagnosis not present

## 2022-01-28 DIAGNOSIS — E1165 Type 2 diabetes mellitus with hyperglycemia: Secondary | ICD-10-CM | POA: Diagnosis not present

## 2022-01-28 NOTE — Patient Instructions (Addendum)
Medication Instructions:  No changes at this time.   *If you need a refill on your cardiac medications before your next appointment, please call your pharmacy*   Lab Work: None  If you have labs (blood work) drawn today and your tests are completely normal, you will receive your results only by: [place] (if you have MyChart) OR A paper copy in the mail If you have any lab test that is abnormal or we need to change your treatment, we will call you to review the results.   Testing/Procedures: Your physician has requested that you have a carotid duplex. This test is an ultrasound of the carotid arteries in your neck. It looks at blood flow through these arteries that supply the brain with blood. Allow one hour for this exam. There are no restrictions or special instructions.   [place]  Your caregiver has ordered a Stress Test with nuclear imaging. The purpose of this test is to evaluate the blood supply to your heart muscle. This procedure is referred to as a "Non-Invasive Stress Test." This is because other than having an IV started in your vein, nothing is inserted or "invades" your body. Cardiac stress tests are done to find areas of poor blood flow to the heart by determining the extent of coronary artery disease (CAD). Some patients exercise on a treadmill, which naturally increases the blood flow to your heart, while others who are  unable to walk on a treadmill due to physical limitations have a pharmacologic/chemical stress agent called Lexiscan . This medicine will mimic walking on a treadmill by temporarily increasing your coronary blood flow.   Please note: these test may take anywhere between 2-4 hours to complete  PLEASE REPORT TO [place] AT THE FIRST DESK WILL DIRECT YOU WHERE TO GO  Date of Procedure:_____________________________________  Arrival Time for Procedure:______________________________  Instructions regarding  medication:   _XX___ : Hold diabetes medication morning of procedure. The night before take 1/2 dose of your Novolog and hold your Basaglar.  _XX___:  Hold Metformin the night before and the morning of your procedure.   _XX___:  Dennis Bast may take all of your medications with small sip of water.   PLEASE NOTIFY THE OFFICE AT LEAST 51 HOURS IN ADVANCE IF YOU ARE UNABLE TO KEEP YOUR APPOINTMENT.  [phone] AND  PLEASE NOTIFY NUCLEAR MEDICINE AT [facility] AT LEAST 24 HOURS IN ADVANCE IF YOU ARE UNABLE TO KEEP YOUR APPOINTMENT. [phone]  How to prepare for your Myoview test:  Do not eat or drink after midnight No caffeine for 24 hours prior to test No smoking 24 hours prior to test. Your medication may be taken with water.  If your doctor stopped a medication because of this test, do not take that medication. Ladies, please do not wear dresses.  Skirts or pants are appropriate. Please wear a short sleeve shirt. No perfume, cologne or lotion. Wear comfortable walking shoes. No heels!   Your physician has recommended that you wear a Zio monitor for 2 weeks.   This monitor is a medical device that records the heart's electrical activity. Doctors most often use these monitors to diagnose arrhythmias. Arrhythmias are problems with the speed or rhythm of the heartbeat. The monitor is a small device applied to your chest. You can wear one while you do your normal daily activities. While wearing this monitor if you have any symptoms to push the button and record what you felt. Once you have  worn this monitor for the period of time provider prescribed (Usually 14 days), you will return the monitor device in the postage paid box. Once it is returned they will download the data collected and provide [place] with a report which the provider will then review and we will call you with those results. Important tips:  Avoid showering during the first 24 hours of wearing the monitor. Avoid excessive sweating to help  maximize wear time. Do not submerge the device, no hot tubs, and no swimming pools. Keep any lotions or oils away from the patch. After 24 hours you may shower with the patch on. Take brief showers with your back facing the shower head.  Do not remove patch once it has been placed because that will interrupt data and decrease adhesive wear time. Push the button when you have any symptoms and write down what you were feeling. Once you have completed wearing your monitor, remove and place into box which has postage paid and place in your outgoing mailbox.  If for some reason you have misplaced your box then call our office and we can provide another box and/or mail it off for you.      Follow-Up: At [facility], you and your health needs are our priority.  As part of our continuing mission to provide you with exceptional heart care, we have created designated Provider Care Teams.  These Care Teams include your primary Cardiologist (physician) and Advanced Practice Providers (APPs -  Physician Assistants and Nurse Practitioners) who all work together to provide you with the care you need, when you need it.   Your next appointment:   6 week(s)  The format for your next appointment:   In Person  Provider:   [name], MD or Gerrie Nordmann, NP      Important Information About [place]

## 2022-01-29 ENCOUNTER — Telehealth: Payer: Self-pay | Admitting: Cardiology

## 2022-01-29 NOTE — Telephone Encounter (Signed)
Returned the call to the patient. The patient has been advised to wait until after the stress test to place the monitor.  ZIO XT- Long Term Monitor Instructions  Your physician has requested you wear a ZIO patch monitor for 14 days.  This is a single patch monitor. [employer] supplies one patch monitor per enrollment. Additional stickers are not available. Please do not apply patch if you will be having a Nuclear Stress Test,  Echocardiogram, Cardiac CT, MRI, or Chest Xray during the period you would be wearing the  monitor. The patch cannot be worn during these tests. You cannot remove and re-apply the  ZIO XT patch monitor.  Your ZIO patch monitor will be mailed 3 day USPS to your address on file. It may take 3-5 days  to receive your monitor after you have been enrolled.  Once you have received your monitor, please review the enclosed instructions. Your monitor  has already been registered assigning a specific monitor serial # to you.

## 2022-01-29 NOTE — Telephone Encounter (Signed)
Pt received his heart monitor, he wants to know if its okay for him to put it on before his tests next week or should he wait after

## 2022-02-01 ENCOUNTER — Telehealth: Payer: Self-pay | Admitting: Cardiovascular Disease

## 2022-02-01 NOTE — Telephone Encounter (Signed)
Attempted to call the patient. No answer- I left a message to please call back, but advised I do not see where one of our nurses had tried to call him today. I asked that he call back so we can try to clarify.

## 2022-02-01 NOTE — Telephone Encounter (Signed)
Patient returned RN's call. 

## 2022-02-02 ENCOUNTER — Other Ambulatory Visit: Payer: Self-pay | Admitting: Physician Assistant

## 2022-02-02 NOTE — Telephone Encounter (Signed)
I spoke with the patient. I advised him that I do not see any messages in his chart that one of our nurses had tried to reach out to him yesterday. Per the patient, no message was left, but our phone number was on his caller ID.   I advised I see that Lattie Haw, RN spoke with him on Friday regarding his heart monitor. I inquired if he had the instructions for his stress test tomorrow and he advised that he did.   The patient is aware that I see nothing further that someone was calling him about. The patient voices understanding and was appreciative for the call back.

## 2022-02-03 ENCOUNTER — Ambulatory Visit: Payer: Medicare Other

## 2022-02-04 ENCOUNTER — Ambulatory Visit: Payer: Medicare Other | Attending: Cardiovascular Disease

## 2022-02-04 DIAGNOSIS — R55 Syncope and collapse: Secondary | ICD-10-CM

## 2022-02-11 ENCOUNTER — Ambulatory Visit
Admission: RE | Admit: 2022-02-11 | Discharge: 2022-02-11 | Disposition: A | Discharge status: Home / Self Care | Payer: Medicare Other | Source: Ambulatory Visit | Attending: Cardiovascular Disease | Admitting: Cardiovascular Disease

## 2022-02-11 ENCOUNTER — Other Ambulatory Visit: Payer: Self-pay | Admitting: Internal Medicine

## 2022-02-11 DIAGNOSIS — R072 Precordial pain: Secondary | ICD-10-CM | POA: Diagnosis not present

## 2022-02-11 LAB — NM MYOCAR MULTI W/SPECT W/WALL MOTION / EF
LV dias vol: 108 mL (ref 62–150)
LV sys vol: 42 mL
Nuc Stress EF: 61 %
Peak HR: 82 {beats}/min
Rest HR: 61 {beats}/min
Rest Nuclear Isotope Dose: 9.6 mCi
SDS: 1
SRS: 2
SSS: 2
ST Depression (mm): 0 mm
Stress Nuclear Isotope Dose: 30.5 mCi
TID: 0.93

## 2022-02-11 MED ORDER — TECHNETIUM TC 99M TETROFOSMIN IV KIT
30.0000 | PACK | Freq: Once | INTRAVENOUS | Status: AC | PRN
  Administered 2022-02-11: 30.45 via INTRAVENOUS

## 2022-02-11 MED ORDER — REGADENOSON 0.4 MG/5ML IV SOLN
0.4000 mg | Freq: Once | INTRAVENOUS | Status: AC
  Administered 2022-02-11: 0.4 mg via INTRAVENOUS

## 2022-02-11 MED ORDER — TECHNETIUM TC 99M TETROFOSMIN IV KIT
10.0000 | PACK | Freq: Once | INTRAVENOUS | Status: AC | PRN
  Administered 2022-02-11: 9.6 via INTRAVENOUS

## 2022-02-16 NOTE — Progress Notes (Signed)
Please make sure the patient is aware of his testing results.

## 2022-02-18 DIAGNOSIS — R55 Syncope and collapse: Secondary | ICD-10-CM | POA: Diagnosis not present

## 2022-03-09 DIAGNOSIS — R55 Syncope and collapse: Secondary | ICD-10-CM | POA: Diagnosis not present

## 2022-03-16 ENCOUNTER — Other Ambulatory Visit: Payer: Self-pay | Admitting: Physician Assistant

## 2022-03-16 ENCOUNTER — Other Ambulatory Visit: Payer: Self-pay | Admitting: Internal Medicine

## 2022-03-18 ENCOUNTER — Ambulatory Visit: Payer: Medicare Other | Admitting: Cardiovascular Disease

## 2022-03-19 ENCOUNTER — Encounter: Payer: Self-pay | Admitting: Cardiovascular Disease

## 2022-03-19 ENCOUNTER — Ambulatory Visit: Payer: Medicare Other | Attending: Cardiovascular Disease | Admitting: Cardiovascular Disease

## 2022-03-19 VITALS — BP 118/58 | HR 60 | Ht 67.0 in | Wt 223.2 lb

## 2022-03-19 DIAGNOSIS — I493 Ventricular premature depolarization: Secondary | ICD-10-CM | POA: Diagnosis not present

## 2022-03-19 DIAGNOSIS — E785 Hyperlipidemia, unspecified: Secondary | ICD-10-CM | POA: Diagnosis not present

## 2022-03-19 DIAGNOSIS — I2510 Atherosclerotic heart disease of native coronary artery without angina pectoris: Secondary | ICD-10-CM

## 2022-03-19 DIAGNOSIS — I10 Essential (primary) hypertension: Secondary | ICD-10-CM

## 2022-03-19 DIAGNOSIS — R001 Bradycardia, unspecified: Secondary | ICD-10-CM

## 2022-03-19 NOTE — Patient Instructions (Signed)
Medication Instructions:  No changes *If you need a refill on your cardiac medications before your next appointment, please call your pharmacy*   Lab Work: None ordered If you have labs (blood work) drawn today and your tests are completely normal, you will receive your results only by: [place] (if you have MyChart) OR A paper copy in the mail If you have any lab test that is abnormal or we need to change your treatment, we will call you to review the results.   Testing/Procedures: None ordered   Follow-Up: At [facility], you and your health needs are our priority.  As part of our continuing mission to provide you with exceptional heart care, we have created designated Provider Care Teams.  These Care Teams include your primary Cardiologist (physician) and Advanced Practice Providers (APPs -  Physician Assistants and Nurse Practitioners) who all work together to provide you with the care you need, when you need it.  We recommend signing up for the patient portal called "MyChart".  Sign up information is provided on this After Visit Summary.  MyChart is used to connect with patients for Virtual Visits (Telemedicine).  Patients are able to view lab/test results, encounter notes, upcoming appointments, etc.  Non-urgent messages can be sent to your provider as well.   To learn more about what you can do with MyChart, go to [email or web address].    Your next appointment:   6 month(s)  Provider:   You may see [name], MD or one of the following Advanced Practice Providers on your designated Care Team:   Murray Hodgkins, NP Christell Faith, PA-C [place] Kathlen Mody, [place] Gerrie Nordmann, NP    Follow up with Dr. Quentin Ore in February

## 2022-03-19 NOTE — Progress Notes (Signed)
Cardiology Office Note   Date:  03/19/2022   ID:  Allen [name], DOB 05-10-1940, MRN [id number]  PCP:  [name], PA-C  Cardiologist:   [name], MD   Chief Complaint  Patient presents with   Other    F/u testing pt would like to discuss metoprolol. Meds reviewed verbally with pt.      History of Present Illness: Allen [name] is a 82 y.o. male who is here today for follow-up visit regarding coronary artery disease, PVCs and bradycardia.    The patient has known history of coronary artery disease with previous myocardial infarction in 1989.  He was treated with angioplasty at [facility] but then had a repeat cardiac catheterization 6 months later that showed occluded vessel with collaterals.  He was treated medically and since then has not had any ischemic cardiac events.  He has prolonged history of diabetes mellitus in addition to hypertension, hyperlipidemia, obesity and sleep apnea on CPAP.  He also has peripheral arterial disease but mostly asymptomatic from that.  He was hospitalized in November of 2019 with sepsis due to cholecystitis.  He underwent cholecystectomy and was treated with antibiotics.  He was noted there to be bradycardic at night with lowest heart rate of 39 bpm.  The dose of Toprol was decreased in half.    He was hospitalized in May 2022 with fatigue and reported bradycardia with a heart rate of 30 bpm but his heart rate was 59 bpm in the ED with PVCs.  Echocardiogram in July 2022 showed normal LV systolic function with mild left ventricular hypertrophy with calcified aortic valve without significant stenosis.  He was seen by Dr. Quentin Ore in August 2022 that his symptoms at that time were stable and medical therapy was recommended.  He went to the emergency room in December with weakness and presyncope when EMS arrived they reported a systolic blood pressure of 60 with heart rate in the 40s.  He responded to normal saline bolus.  His vital signs were stable in  the ED.  He denies chest pain or worsening dyspnea at this time.  He continues to report some fatigue but no further episodes of dizziness.  He underwent a Lexiscan Myoview which showed evidence of small inferior infarct without significant ischemia. 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  Minimum heart rate was 43 bpm recorded in the afternoon.      Past Medical History:  Diagnosis Date   Coronary artery disease    a. 1989 s/p MI w/ PTCA to unknown vessel. Reports cath 6 months later with occluded vessel and collaterals--> medically managed since..   Diabetes mellitus without complication ([place])    Hyperlipidemia LDL goal <70    Hypertension    a. 02/2020 nl renal artery duplex.   MI (myocardial infarction) ([place])    PAD (peripheral artery disease) ([place])    a. 12/2019 ABIs: R 1.02, L 0.56-->unchanged.   Sleep apnea     Past Surgical History:  Procedure Laterality Date   [place]   GALLBLADDER SURGERY     LEG SURGERY Right    stent placement     Current Outpatient Medications  Medication Sig Dispense Refill   aspirin 81 MG chewable tablet Chew by mouth daily.     b complex vitamins tablet Take 1 tablet by mouth daily.     B-D ULTRA-FINE 33 LANCETS MISC Use 1 Lancet 3 (three)  times daily.     cetirizine (ZYRTEC) 10 MG tablet Take 10 mg by mouth daily.     Cholecalciferol (VITAMIN D3) 1000 units CAPS Take by mouth.     Continuous Blood Gluc Sensor (FREESTYLE LIBRE 14 DAY SENSOR) MISC Use 1 kit every 14 (fourteen) days 2 each 11   Continuous Blood Gluc Sensor (FREESTYLE LIBRE SENSOR SYSTEM) MISC Use 3 each every 10 (ten) days.     empagliflozin (JARDIANCE) 25 MG TABS tablet Take 1 tablet (25 mg total) by mouth daily before breakfast. 90 tablet 3   ezetimibe-simvastatin (VYTORIN) 10-40 MG tablet TAKE 1 TABLET BY MOUTH DAILY 90 tablet 1   folic acid (FOLVITE) 161 MCG tablet Take 400 mcg by mouth daily.      Garlic 0960 MG CAPS Take by mouth.     glucose blood test strip Check blood sugar 5x a day as directed     insulin aspart (NOVOLOG) 100 UNIT/ML FlexPen INJECT 10 UNITS UNDER SKIN TWICE DAILY BEFORE MORNING AND EVENING MEAL FOR DIABETES.DISCARD PEN 28 DAYS AFTER OPENING PLUS SLIDING SCALE     Insulin Glargine (BASAGLAR KWIKPEN) 100 UNIT/ML ADMINISTER 26 TO 40 UNITS UNDER THE SKIN EVERY DAY 15 mL 3   Insulin Pen Needle 32G X 4 MM MISC USE AS DIRECTED FOUR TIMES A DAY WITH LEVEMIR FLEXTOUCH AND HUMALOG PEN.  DX CODE E11.29 100 each 3   Insulin Syringe-Needle U-100 (INSULIN SYRINGE 1CC/31GX5/16") 31G X 5/16" 1 ML MISC Use as directed with insulin 100 each 3   Insulin Syringes, Disposable, U-100 1 ML MISC by Does not apply route.     levothyroxine (SYNTHROID) 75 MCG tablet TAKE 1 TABLET BY MOUTH DAILY BEFORE BREAKFAST 90 tablet 3   metFORMIN (GLUCOPHAGE) 500 MG tablet TAKE 1 TABLET(500 MG) BY MOUTH TWICE DAILY WITH A MEAL 180 tablet 1   metoprolol succinate (TOPROL-XL) 25 MG 24 hr tablet TAKE 1 TABLET(25 MG) BY MOUTH DAILY (Patient taking differently: Take 12.5 mg by mouth daily.) 90 tablet 1   Multiple Vitamin (MULTIVITAMIN WITH MINERALS) TABS tablet Take 1 tablet by mouth daily.     nystatin ointment (MYCOSTATIN) Apply 1 application topically 3 (three) times daily. 30 g 2   traMADol (ULTRAM) 50 MG tablet Take 100 mg by mouth daily.      valsartan (DIOVAN) 160 MG tablet TAKE 1 TABLET BY MOUTH DAILY 90 tablet 3   vitamin B-12 (CYANOCOBALAMIN) 1000 MCG tablet Take 1,000 mcg by mouth daily.     No current facility-administered medications for this visit.    Allergies:   Iodinated contrast media, Iohexol, Duloxetine, Gabapentin, Penicillins, and Other    Social History:  The patient  reports that he has quit smoking. His smoking use included cigarettes. He has a 96.00 pack-year smoking history. He has never used smokeless tobacco. He reports that he does not drink alcohol and does not use drugs.    Family History:  The patient's family history includes Brain cancer in his mother; Diabetes in his mother; Prostate cancer in his brother; Throat cancer in his father.    ROS:  Please see the history of present illness.   Otherwise, review of systems are positive for none.   All other systems are reviewed and negative.    PHYSICAL EXAM: VS:  BP (!) 118/58 (BP Location: Left Arm, Patient Position: Sitting, Cuff Size: Large)   Pulse 60   Ht 5\' 7"  (1.702 m)   Wt 223 lb 4 oz (101.3 kg)   SpO2  98%   BMI 34.97 kg/m  , BMI Body mass index is 34.97 kg/m. GEN: Well nourished, well developed, in no acute distress  HEENT: normal  Neck: no JVD, carotid bruits, or masses Cardiac: RRR; no  rubs, or gallops,no edema .  2 out of 6 systolic murmur in the aortic area. Respiratory:  clear to auscultation bilaterally, normal work of breathing GI: soft, nontender, nondistended, + BS MS: no deformity or atrophy  Skin: warm and dry, no rash Neuro:  Strength and sensation are intact Psych: euthymic mood, full affect   EKG:  EKG is ordered today. The ekg ordered today demonstrates normal sinus rhythm with first-degree AV block, 2 PVCs and prior old inferior infarct.  Recent Labs: 12/11/2021: TSH 2.370 01/23/2022: ALT 19; BUN 37; Creatinine, Ser 1.41; Hemoglobin 13.9; Platelets 210; Potassium 3.6; Sodium 140    Lipid Panel    Component Value Date/Time   CHOL 139 12/11/2021 1150   TRIG 120 12/11/2021 1150   HDL 75 12/11/2021 1150   CHOLHDL 1.9 12/27/2016 1445   VLDL 18 12/27/2016 1445   LDLCALC 43 12/11/2021 1150      Wt Readings from Last 3 Encounters:  03/19/22 223 lb 4 oz (101.3 kg)  01/28/22 222 lb 12.8 oz (101.1 kg)  01/23/22 220 lb (99.8 kg)         02/16/2018    2:20 PM  PAD Screen  Previous PAD dx? Yes  Previous surgical procedure? Yes  Pain with walking? No  Feet/toe relief with dangling? No  Painful, non-healing ulcers? No  Extremities discolored? No       ASSESSMENT AND PLAN:  1.  Coronary artery disease involving native coronary arteries without angina: no anginal symptoms at the present time.  Recent [employer] showed evidence of prior inferior infarct without ischemia.  This is consistent with his history.  Continue medical therapy.  2.  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  Fortunately, his ejection fraction is normal and he is not very symptomatic. However, the high burden of PVC might require treatment and thus he will be seeing Dr. Quentin Ore for follow-up to discuss.  Options include permanent pacemaker placement in order to be able to uptitrate Toprol or at least considering a loop recorder to try to correlate with his random episodes of dizziness and presyncope.  3.  Essential hypertension: Blood pressures controlled on current medications.  4.  Hyperlipidemia: Currently on Vytorin.  Most recent LDL was 43.    Disposition:   Follow-up with Dr. Quentin Ore as planned.  Follow-up with me in 6 months.  Signed,  [name], MD  03/19/2022 11:11 AM    [place]

## 2022-04-13 NOTE — Progress Notes (Unsigned)
Electrophysiology Office Follow up Visit Note:    Date:  04/14/2022   ID:  Allen [name], DOB [birth year], MRN [id number]  PCP:  [name], PA-C  [facility] Cardiologist:  [name], MD  [facility] Electrophysiologist:  Vickie Epley, MD    Interval History:    Allen [name] is a 82 y.o. male who presents for a follow up visit.   Last seen 09/17/2020 for PVC's. Prior Zio showed a PVC ablation of 21%. He was asymptomatic at the last appointment. He has normal LV function.   Today he is with his wife in clinic and he tells me that when he passed out in December he was eating pizza at home.  He had just put his dog in a car and injured his right shoulder and was in intense pain.  The pain continued while he was eating.  He describes it as severe.  He felt like the pizza got stuck when he swallowed it and he began to feel lightheaded.  The lightheadedness worsened until he lost consciousness.  When EMS arrived he was significantly hypotensive with systolics in the 0000000.  Those improved significantly/quickly when they laid him flat on the ground.  His heart rates were in the 40s and 50s during this time.    Past medical, surgical, social and family history were reviewed.  ROS:   Please see the history of present illness.    All other systems reviewed and are negative.  EKGs/Labs/Other Studies Reviewed:    The following studies were reviewed today:  03/09/2022 Zio Patient had a min HR of 30 bpm, max HR of 125 bpm, and avg HR of 55 bpm. Predominant underlying rhythm was Sinus Rhythm. First Degree AV Block was present. 1 run of Supraventricular Tachycardia occurred lasting 34.3 secs with a max rate of 125 bpm (avg  112 bpm). Episode of Supraventricular Tachycardia may be possible Atrial Tachycardia with variable block. Second Degree AV Block-Mobitz I (Wenckebach) was present.  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      Physical Exam:    VS:  BP 110/68   Pulse 62   Ht  '5\' 8"'$  (1.727 m)   Wt 223 lb 9.6 oz (101.4 kg)   SpO2 98%   BMI 34.00 kg/m     Wt Readings from Last 3 Encounters:  04/14/22 223 lb 9.6 oz (101.4 kg)  03/19/22 223 lb 4 oz (101.3 kg)  01/28/22 222 lb 12.8 oz (101.1 kg)     GEN:  Well nourished, well developed in no acute distress CARDIAC: Irregular rhythm, no murmurs, rubs, gallops RESPIRATORY:  Clear to auscultation without rales, wheezing or rhonchi       ASSESSMENT:    1. PVC's (premature ventricular contractions)   2. Coronary artery disease involving native coronary artery of native heart without angina pectoris    PLAN:    In order of problems listed above:  #Syncope Based on his history I suspect his syncope is vasovagal.  I do not have high suspicion that his syncope is arrhythmic in origin.  He has PVCs but these are chronic and asymptomatic.  The most recent passing out episode seems to be triggered by intense pain in his right shoulder and potentially with him eating pizza and having it feel like it got stuck in the esophagus.  We discussed the pathophysiology of vasovagal syncope in detail during today's clinic appointment.  We discussed the lack of available treatment options for vasovagal syncope.  I  encouraged him to stay hydrated.  I have encouraged him to get to the ground immediately should he experience any prodromal symptoms.  We discussed loop recorder monitoring as an added layer of security to exclude arrhythmic causes of syncope.  He is very interested in proceeding with loop recorder monitoring which I think is very reasonable.  I discussed the loop recorder procedure in detail including the risks and monthly monitoring cost and he wishes to proceed.  We will get this scheduled in the upcoming few weeks.  #PVC's Asymptomatic. Normal EF. Continue metoprolol.  Discussed treatment options for PVC's. Given his normal EF and asymptomatic nature of the PVC's, I have recommended continued conservative management.     #CAD No ischemic symptoms today.    Follow-up in the next few weeks for loop recorder implant.    Signed, Lars Mage, MD, [facility], [facility] 04/14/2022 11:57 AM    Electrophysiology [facility]

## 2022-04-14 ENCOUNTER — Encounter: Payer: Self-pay | Admitting: Cardiology

## 2022-04-14 ENCOUNTER — Ambulatory Visit: Payer: Medicare Other | Attending: Cardiology | Admitting: Cardiology

## 2022-04-14 VITALS — BP 110/68 | HR 62 | Ht 68.0 in | Wt 223.6 lb

## 2022-04-14 DIAGNOSIS — I493 Ventricular premature depolarization: Secondary | ICD-10-CM | POA: Diagnosis not present

## 2022-04-14 DIAGNOSIS — I2510 Atherosclerotic heart disease of native coronary artery without angina pectoris: Secondary | ICD-10-CM | POA: Diagnosis not present

## 2022-04-14 NOTE — Patient Instructions (Signed)
Medication Instructions:  Your physician recommends that you continue on your current medications as directed. Please refer to the Current Medication list given to you today.  *If you need a refill on your cardiac medications before your next appointment, please call your pharmacy  Testing/Procedures: You will have a loop recorder implanted at your next office visit. There are no restrictions or special instructions for this appointment.   Follow-Up: At [facility], you and your health needs are our priority.  As part of our continuing mission to provide you with exceptional heart care, we have created designated Provider Care Teams.  These Care Teams include your primary Cardiologist (physician) and Advanced Practice Providers (APPs -  Physician Assistants and Nurse Practitioners) who all work together to provide you with the care you need, when you need it.  Your next appointment:   Next available  Provider:   Lars Mage, MD

## 2022-04-30 ENCOUNTER — Other Ambulatory Visit: Payer: Self-pay | Admitting: Physician Assistant

## 2022-05-25 NOTE — Progress Notes (Unsigned)
  Electrophysiology Office Follow up Visit Note:    Date:  05/25/2022   ID:  Allen [name], DOB April 22, 1940, MRN [id number]  PCP:  Carlean Jews, PA-C  [facility] Cardiologist:  Lorine Bears, MD  [facility] Electrophysiologist:  Lanier Prude, MD    Interval History:    Allen [name] is a 82 y.o. male who presents for a follow up visit.   I last saw him 04/14/2022 for syncope and PVC's. We planned to follow up today for loop implant for ongoingo rhythm surveillance. He has been taking his metoprolol for the PVC's.        Past medical, surgical, social and family history were reviewed.  ROS:   Please see the history of present illness.    All other systems reviewed and are negative.  EKGs/Labs/Other Studies Reviewed:    The following studies were reviewed today:   Physical Exam:    VS:  There were no vitals taken for this visit.    Wt Readings from Last 3 Encounters:  04/14/22 223 lb 9.6 oz (101.4 kg)  03/19/22 223 lb 4 oz (101.3 kg)  01/28/22 222 lb 12.8 oz (101.1 kg)     GEN: *** Well nourished, well developed in no acute distress CARDIAC: ***RRR, no murmurs, rubs, gallops RESPIRATORY:  Clear to auscultation without rales, wheezing or rhonchi       ASSESSMENT:    No diagnosis found. PLAN:    In order of problems listed above:  #Syncope Unclear exact cause. Differential would include vasovagal vs arrhythmic. Plan for ILR today. Procedure discussed including risks and monthly monitoring costs and he wishes to proceed.  #Hypertension *** goal today.  Recommend checking blood pressures 1-2 times per week at home and recording the values.  Recommend bringing these recordings to the primary care physician.    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 Implantable loop recorder implantation    INTRODUCTION:  Allen [name] presents with a history of {LOOPDIAGNOSESlowercase:27738::"cryptogenic stroke"} The costs of loop recorder monitoring have been discussed with the patient.    DESCRIPTION OF PROCEDURE:  Informed written consent was obtained.  The patient required no sedation for the procedure today.  Mapping over the patient's chest was performed to identify the area where electrograms were most prominent for ILR recording.  This area was found to be the left parasternal region over the 4th intercostal space. The patients left chest was therefore prepped and draped in the usual sterile fashion. The skin overlying the left parasternal region was infiltrated with lidocaine for local analgesia.  A 0.5-cm incision was made over the left parasternal region over the 3rd intercostal space.  A subcutaneous ILR pocket was fashioned using a combination of sharp and blunt dissection.  A {LOOPLIST:27736} implantable loop recorder was then placed into the pocket  R waves were very prominent and measured >0.57mV.  Steri- Strips and a sterile dressing were then applied.  There were no early apparent complications.     CONCLUSIONS:   1. Successful implantation of a implantable loop recorder for {LOOPDIAGNOSES:27737::"Cryptogenic stroke"}  2. No early apparent complications.   Allen [name] T. Lalla Brothers, MD, [facility], [facility] Cardiac Electrophysiology

## 2022-05-26 ENCOUNTER — Encounter: Payer: Self-pay | Admitting: Cardiology

## 2022-05-26 ENCOUNTER — Ambulatory Visit: Payer: Medicare Other | Attending: Cardiology | Admitting: Cardiology

## 2022-05-26 VITALS — BP 138/64 | HR 63

## 2022-05-26 DIAGNOSIS — R55 Syncope and collapse: Secondary | ICD-10-CM | POA: Insufficient documentation

## 2022-05-26 DIAGNOSIS — I10 Essential (primary) hypertension: Secondary | ICD-10-CM | POA: Diagnosis not present

## 2022-05-26 DIAGNOSIS — I493 Ventricular premature depolarization: Secondary | ICD-10-CM | POA: Insufficient documentation

## 2022-05-26 NOTE — Patient Instructions (Signed)
Medication Instructions:  Your physician recommends that you continue on your current medications as directed. Please refer to the Current Medication list given to you today.  Labwork: None ordered.  Testing/Procedures: None ordered.  Follow-Up:  Your physician wants you to follow-up in: 6 months with Levy Sjogren, NP.  You will receive a reminder letter in the mail two months in advance. If you don't receive a letter, please call our office to schedule the follow-up appointment.    Implantable Loop Recorder Placement, Care After This sheet gives you information about how to care for yourself after your procedure. Your health care provider may also give you more specific instructions. If you have problems or questions, contact your health care provider. What can I expect after the procedure? After the procedure, it is common to have: Soreness or discomfort near the incision. Some swelling or bruising near the incision.  Follow these instructions at home: Incision care  Monitor your cardiac device site for redness, swelling, and drainage. Call the device clinic at [phone] if you experience these symptoms or fever/chills.  Keep the large square bandage on your site for 24 hours and then you may remove it yourself. Keep the steri-strips underneath in place.   You may shower after 72 hours / 3 days from your procedure with the steri-strips in place. They will usually fall off on their own, or may be removed after 10 days. Pat dry.   Avoid lotions, ointments, or perfumes over your incision until it is well-healed.  Please do not submerge in water until your site is completely healed.   Your device is MRI compatible.   Remote monitoring is used to monitor your cardiac device from home. This monitoring is scheduled every month by our office. It allows [place] to keep an eye on the function of your device to ensure it is working properly.  If your wound site starts to bleed apply  pressure.    For help with the monitor please call [employer] Monitor Support Specialist directly at [phone].    If you have any questions/concerns please call the device clinic at [phone].  Activity  Return to your normal activities.  General instructions Follow instructions from your health care provider about how to manage your implantable loop recorder and transmit the information. Learn how to activate a recording if this is necessary for your type of device. You may go through a metal detection gate, and you may let someone hold a metal detector over your chest. Show your ID card if needed. Do not have an MRI unless you check with your health care provider first. Take over-the-counter and prescription medicines only as told by your health care provider. Keep all follow-up visits as told by your health care provider. This is important. Contact a health care provider if: You have redness, swelling, or pain around your incision. You have a fever. You have pain that is not relieved by your pain medicine. You have triggered your device because of fainting (syncope) or because of a heartbeat that feels like it is racing, slow, fluttering, or skipping (palpitations). Get help right away if you have: Chest pain. Difficulty breathing. Summary After the procedure, it is common to have soreness or discomfort near the incision. Change your dressing as told by your health care provider. Follow instructions from your health care provider about how to manage your implantable loop recorder and transmit the information. Keep all follow-up visits as told by your health care provider. This is important. This  information is not intended to replace advice given to you by your health care provider. Make sure you discuss any questions you have with your health care provider. Document Released: 01/13/2015 Document Revised: 03/19/2017 Document Reviewed: 03/19/2017 Elsevier Patient Education   2020 [employer].

## 2022-06-10 ENCOUNTER — Ambulatory Visit: Payer: Medicare Other | Admitting: Physician Assistant

## 2022-06-15 ENCOUNTER — Ambulatory Visit (INDEPENDENT_AMBULATORY_CARE_PROVIDER_SITE_OTHER): Payer: Medicare Other | Admitting: Nurse Practitioner

## 2022-06-15 ENCOUNTER — Telehealth: Payer: Self-pay | Admitting: Physician Assistant

## 2022-06-15 ENCOUNTER — Encounter: Payer: Self-pay | Admitting: Nurse Practitioner

## 2022-06-15 VITALS — BP 143/67 | HR 79 | Temp 98.2°F | Resp 16 | Ht 68.0 in | Wt 222.4 lb

## 2022-06-15 DIAGNOSIS — Z794 Long term (current) use of insulin: Secondary | ICD-10-CM

## 2022-06-15 DIAGNOSIS — G4733 Obstructive sleep apnea (adult) (pediatric): Secondary | ICD-10-CM | POA: Diagnosis not present

## 2022-06-15 DIAGNOSIS — B351 Tinea unguium: Secondary | ICD-10-CM

## 2022-06-15 DIAGNOSIS — E1159 Type 2 diabetes mellitus with other circulatory complications: Secondary | ICD-10-CM | POA: Diagnosis not present

## 2022-06-15 DIAGNOSIS — L03115 Cellulitis of right lower limb: Secondary | ICD-10-CM | POA: Diagnosis not present

## 2022-06-15 MED ORDER — MUPIROCIN 2 % EX OINT: 1.0000 | TOPICAL_OINTMENT | Freq: Every day | CUTANEOUS | 2 refills | Status: DC

## 2022-06-15 MED ORDER — DOXYCYCLINE HYCLATE 100 MG PO TABS: 100.0000 mg | ORAL_TABLET | Freq: Two times a day (BID) | ORAL | 0 refills | Status: AC

## 2022-06-15 NOTE — Telephone Encounter (Signed)
Podiatry referral sent via [facility] to Dr. Al Corpus with [name]

## 2022-06-15 NOTE — Progress Notes (Signed)
[facility] [address] [place], [place] [address]  Internal MEDICINE  Office Visit Note  Patient Name: Allen [name]  [id number]  [id number]  Date of Service: 06/15/2022  Chief Complaint  Patient presents with   Follow-up   Sleep Apnea    HPI Dennard presents for a follow up visit for OSA on CPAP  82 years old possibly.  Does not need any supplies right now.    CPAP download 95 percentile pressure 9 95th percentile leak 34.7 apnea index 1.4 /hr apnea-hypopnea index  3.4 /hr total days used  >4 hr 90 days total days used <4 hr 0 days Total compliance 100 percent   He is doing great no problems or questions at this time  Pt was seen by Tresa Endo  RRT/RCP  from [facility]   ---also has a mild infection of his right foot near the base of his 1st and 2nd toes. Has been applying neosporin but the infection is not improving.   Current Medication: Outpatient Encounter Medications as of 06/15/2022  Medication Sig   aspirin 81 MG chewable tablet Chew by mouth daily.   b complex vitamins tablet Take 1 tablet by mouth daily.   B-D ULTRA-FINE 33 LANCETS MISC Use 1 Lancet 3 (three) times daily.   cetirizine (ZYRTEC) 10 MG tablet Take 10 mg by mouth daily.   Cholecalciferol (VITAMIN D3) 1000 units CAPS Take by mouth.   Continuous Blood Gluc Sensor (FREESTYLE LIBRE 14 DAY SENSOR) MISC Use 1 kit every 14 (fourteen) days   Continuous Blood Gluc Sensor (FREESTYLE LIBRE SENSOR SYSTEM) MISC Use 3 each every 10 (ten) days.   doxycycline (VIBRA-TABS) 100 MG tablet Take 1 tablet (100 mg total) by mouth 2 (two) times daily for 7 days. Take with food   empagliflozin (JARDIANCE) 25 MG TABS tablet Take 1 tablet (25 mg total) by mouth daily before breakfast.   ezetimibe-simvastatin (VYTORIN) 10-40 MG tablet TAKE 1 TABLET BY MOUTH DAILY   folic acid (FOLVITE) 400 MCG tablet Take 400 mcg by mouth daily.   Garlic 1000 MG CAPS Take by mouth.   glucose blood test strip Check blood sugar 5x a day as  directed   insulin aspart (NOVOLOG) 100 UNIT/ML FlexPen INJECT 10 UNITS UNDER SKIN TWICE DAILY BEFORE MORNING AND EVENING MEAL FOR DIABETES.DISCARD PEN 28 DAYS AFTER OPENING PLUS SLIDING SCALE   Insulin Glargine (BASAGLAR KWIKPEN) 100 UNIT/ML ADMINISTER 26 TO 40 UNITS UNDER THE SKIN EVERY DAY   Insulin Pen Needle 32G X 4 MM MISC USE AS DIRECTED FOUR TIMES A DAY WITH LEVEMIR FLEXTOUCH AND HUMALOG PEN.  DX CODE E11.29   Insulin Syringe-Needle U-100 (INSULIN SYRINGE 1CC/31GX5/16") 31G X 5/16" 1 ML MISC Use as directed with insulin   Insulin Syringes, Disposable, U-100 1 ML MISC by Does not apply route.   levothyroxine (SYNTHROID) 75 MCG tablet TAKE 1 TABLET BY MOUTH DAILY BEFORE BREAKFAST   metFORMIN (GLUCOPHAGE) 500 MG tablet TAKE 1 TABLET(500 MG) BY MOUTH TWICE DAILY WITH A MEAL   metoprolol succinate (TOPROL-XL) 25 MG 24 hr tablet TAKE 1 TABLET(25 MG) BY MOUTH DAILY (Patient taking differently: Take 12.5 mg by mouth daily.)   Multiple Vitamin (MULTIVITAMIN WITH MINERALS) TABS tablet Take 1 tablet by mouth daily.   mupirocin ointment (BACTROBAN) 2 % Apply 1 Application topically daily. To affected area until healed   nystatin ointment (MYCOSTATIN) Apply 1 application topically 3 (three) times daily.   traMADol (ULTRAM) 50 MG tablet Take 100 mg by mouth daily.  valsartan (DIOVAN) 160 MG tablet TAKE 1 TABLET BY MOUTH DAILY   vitamin B-12 (CYANOCOBALAMIN) 1000 MCG tablet Take 1,000 mcg by mouth daily.   No facility-administered encounter medications on file as of 06/15/2022.    Surgical History: Past Surgical History:  Procedure Laterality Date   CARDIAC CATHETERIZATION  1989   CARDIAC CATHETERIZATION  1990   GALLBLADDER SURGERY     LEG SURGERY Right    stent placement    Medical History: Past Medical History:  Diagnosis Date   Coronary artery disease    a. 1989 s/p MI w/ PTCA to unknown vessel. Reports cath 6 months later with occluded vessel and collaterals--> medically managed  since..   Diabetes mellitus without complication (HCC)    Hyperlipidemia LDL goal <70    Hypertension    a. 02/2020 nl renal artery duplex.   MI (myocardial infarction) (HCC)    PAD (peripheral artery disease) (HCC)    a. 12/2019 ABIs: R 1.02, L 0.56-->unchanged.   Sleep apnea     Family History: Family History  Problem Relation Age of Onset   Diabetes Mother    Brain cancer Mother    Throat cancer Father    Prostate cancer Brother     Social History   Socioeconomic History   Marital status: Married    Spouse name: Not on file   Number of children: Not on file   Years of education: Not on file   Highest education level: Not on file  Occupational History   Not on file  Tobacco Use   Smoking status: Former    Packs/day: 3.00    Years: 32.00    Additional pack years: 0.00    Total pack years: 96.00    Types: Cigarettes   Smokeless tobacco: Never  Vaping Use   Vaping Use: Never used  Substance and Sexual Activity   Alcohol use: No   Drug use: No   Sexual activity: Not on file  Other Topics Concern   Not on file  Social History Narrative   Not on file   Social Determinants of Health   Financial Resource Strain: Not on file  Food Insecurity: Not on file  Transportation Needs: Not on file  Physical Activity: Not on file  Stress: Not on file  Social Connections: Not on file  Intimate Partner Violence: Not on file      Review of Systems  Constitutional: Negative.   Respiratory: Negative.  Negative for cough, chest tightness, shortness of breath and wheezing.   Cardiovascular: Negative.  Negative for chest pain and palpitations.  Skin:  Positive for color change (feet are dark) and wound.  Neurological: Negative.     Vital Signs: BP (!) 143/67   Pulse 79   Temp 98.2 F (36.8 C)   Resp 16   Ht 5\' 8"  (1.727 m)   Wt 222 lb 6.4 oz (100.9 kg)   SpO2 96%   BMI 33.82 kg/m    Physical Exam Vitals reviewed.  Constitutional:      General: He is not in  acute distress.    Appearance: Normal appearance. He is obese. He is not ill-appearing.  HENT:     Head: Normocephalic.  Eyes:     Pupils: Pupils are equal, round, and reactive to light.  Cardiovascular:     Rate and Rhythm: Normal rate and regular rhythm.  Pulmonary:     Effort: Pulmonary effort is normal. No respiratory distress.  Skin:      Neurological:  Mental Status: He is alert and oriented to person, place, and time.  Psychiatric:        Mood and Affect: Mood normal.        Behavior: Behavior normal.        Assessment/Plan: 1. OSA on CPAP No issues. 100% compliance. Follows up yearly. Does not need any supplies.   2. Cellulitis of right foot Mupirocin prescribed to apply to the superficial wound. Doxycycline prescribed for 7 day course - mupirocin ointment (BACTROBAN) 2 %; Apply 1 Application topically daily. To affected area until healed  Dispense: 30 g; Refill: 2 - doxycycline (VIBRA-TABS) 100 MG tablet; Take 1 tablet (100 mg total) by mouth 2 (two) times daily for 7 days. Take with food  Dispense: 14 tablet; Refill: 0  3. Type 2 diabetes mellitus with other circulatory complication, with long-term current use of insulin (HCC) Referred to podiatry - Ambulatory referral to Podiatry  4. Onychomycosis Referred to podiatry - Ambulatory referral to Podiatry   General Counseling: Cranston Neighbor understanding of the findings of todays visit and agrees with plan of treatment. I have discussed any further diagnostic evaluation that may be needed or ordered today. We also reviewed his medications today. he has been encouraged to call the office with any questions or concerns that should arise related to todays visit.    Orders Placed This Encounter  Procedures   Ambulatory referral to Podiatry    Meds ordered this encounter  Medications   mupirocin ointment (BACTROBAN) 2 %    Sig: Apply 1 Application topically daily. To affected area until healed     Dispense:  30 g    Refill:  2   doxycycline (VIBRA-TABS) 100 MG tablet    Sig: Take 1 tablet (100 mg total) by mouth 2 (two) times daily for 7 days. Take with food    Dispense:  14 tablet    Refill:  0    Return in about 1 year (around 06/15/2023) for F/U, pulmonary/sleep with DSK or Norvin Ohlin. otherwise as normal/as needed with PCP.   Total time spent:30 Minutes Time spent includes review of chart, medications, test results, and follow up plan with the patient.   [place] Controlled Substance Database was reviewed by me.  This patient was seen by Sallyanne Kuster, FNP-C in collaboration with Dr. Beverely Risen as a part of collaborative care agreement.   Taro Hidrogo R. Tedd Sias, MSN, FNP-C Internal medicine

## 2022-06-16 ENCOUNTER — Telehealth: Payer: Self-pay | Admitting: Physician Assistant

## 2022-06-16 NOTE — Telephone Encounter (Signed)
395 pages of MR printed for patient. He will p/u after 06/17/22 appointment. At [facility]

## 2022-06-17 ENCOUNTER — Ambulatory Visit (INDEPENDENT_AMBULATORY_CARE_PROVIDER_SITE_OTHER): Payer: Medicare Other | Admitting: Physician Assistant

## 2022-06-17 ENCOUNTER — Telehealth: Payer: Self-pay | Admitting: Physician Assistant

## 2022-06-17 VITALS — BP 123/60 | HR 74 | Temp 98.0°F | Resp 16 | Ht 68.0 in | Wt 221.0 lb

## 2022-06-17 DIAGNOSIS — E1159 Type 2 diabetes mellitus with other circulatory complications: Secondary | ICD-10-CM

## 2022-06-17 DIAGNOSIS — Z794 Long term (current) use of insulin: Secondary | ICD-10-CM | POA: Diagnosis not present

## 2022-06-17 DIAGNOSIS — S4991XD Unspecified injury of right shoulder and upper arm, subsequent encounter: Secondary | ICD-10-CM

## 2022-06-17 DIAGNOSIS — S41111A Laceration without foreign body of right upper arm, initial encounter: Secondary | ICD-10-CM

## 2022-06-17 DIAGNOSIS — I10 Essential (primary) hypertension: Secondary | ICD-10-CM | POA: Diagnosis not present

## 2022-06-17 NOTE — Telephone Encounter (Signed)
Awaiting 06/17/22 office notes for Orthopedic referral-[name]

## 2022-06-17 NOTE — Progress Notes (Signed)
[facility] [address] [place], [place] [address]  Internal MEDICINE  Office Visit Note  Patient Name: Allen [name]  [id number]  [id number]  Date of Service: 06/23/2022  Chief Complaint  Patient presents with   Follow-up   Diabetes   Hyperlipidemia   Hypertension    HPI Pt is here for routine follow up -Did have a fall about 4-5 days ago and hit right arm on the furnace and cut it, but is improving -Does have rollator and wheelchair and canes at home, uses single point cane normally. Fall occurred by tripping on lip of tile into bathroom while turning to talk with his wife and not watching his step.  States he did not lose consciousness and really only his arm that was injured. -He was seen in office a few days ago for his cpap follow up and was also given an antibiotic for a toe infection which should also help arm though does not appear infected in office today. Was advised to update tdap at pharmacy -Last saw the [place] about 2 months ago, he is seeing their endocrinologist and they are now adjusting his meds for diabetes control -uses cpap nightly  -Seeing cardiology regularly and has a loop recorder now. States they aren't sure what is going on but he had syncope and low BP a few months ago and they are watching now -Strained right shoulder picking up dog, has been using topical about 4 days and it helped, but can still feel it now. This was on the same day he went to ED. Unclear if related. He may go to ortho for further evaluation as reaching back is difficult.  Current Medication: Outpatient Encounter Medications as of 06/17/2022  Medication Sig   aspirin 81 MG chewable tablet Chew by mouth daily.   b complex vitamins tablet Take 1 tablet by mouth daily.   B-D ULTRA-FINE 33 LANCETS MISC Use 1 Lancet 3 (three) times daily.   cetirizine (ZYRTEC) 10 MG tablet Take 10 mg by mouth daily.   Cholecalciferol (VITAMIN D3) 1000 units CAPS Take by mouth.   Continuous Blood Gluc  Sensor (FREESTYLE LIBRE 14 DAY SENSOR) MISC Use 1 kit every 14 (fourteen) days   Continuous Blood Gluc Sensor (FREESTYLE LIBRE SENSOR SYSTEM) MISC Use 3 each every 10 (ten) days.   [EXPIRED] doxycycline (VIBRA-TABS) 100 MG tablet Take 1 tablet (100 mg total) by mouth 2 (two) times daily for 7 days. Take with food   empagliflozin (JARDIANCE) 25 MG TABS tablet Take 1 tablet (25 mg total) by mouth daily before breakfast.   ezetimibe-simvastatin (VYTORIN) 10-40 MG tablet TAKE 1 TABLET BY MOUTH DAILY   folic acid (FOLVITE) 400 MCG tablet Take 400 mcg by mouth daily.   Garlic 1000 MG CAPS Take by mouth.   glucose blood test strip Check blood sugar 5x a day as directed   insulin aspart (NOVOLOG) 100 UNIT/ML FlexPen INJECT 10 UNITS UNDER SKIN TWICE DAILY BEFORE MORNING AND EVENING MEAL FOR DIABETES.DISCARD PEN 28 DAYS AFTER OPENING PLUS SLIDING SCALE   Insulin Glargine (BASAGLAR KWIKPEN) 100 UNIT/ML ADMINISTER 26 TO 40 UNITS UNDER THE SKIN EVERY DAY   Insulin Pen Needle 32G X 4 MM MISC USE AS DIRECTED FOUR TIMES A DAY WITH LEVEMIR FLEXTOUCH AND HUMALOG PEN.  DX CODE E11.29   Insulin Syringe-Needle U-100 (INSULIN SYRINGE 1CC/31GX5/16") 31G X 5/16" 1 ML MISC Use as directed with insulin   Insulin Syringes, Disposable, U-100 1 ML MISC by Does not apply route.  levothyroxine (SYNTHROID) 75 MCG tablet TAKE 1 TABLET BY MOUTH DAILY BEFORE BREAKFAST   metFORMIN (GLUCOPHAGE) 500 MG tablet TAKE 1 TABLET(500 MG) BY MOUTH TWICE DAILY WITH A MEAL   metoprolol succinate (TOPROL-XL) 25 MG 24 hr tablet TAKE 1 TABLET(25 MG) BY MOUTH DAILY (Patient taking differently: Take 12.5 mg by mouth daily.)   Multiple Vitamin (MULTIVITAMIN WITH MINERALS) TABS tablet Take 1 tablet by mouth daily.   mupirocin ointment (BACTROBAN) 2 % Apply 1 Application topically daily. To affected area until healed   nystatin ointment (MYCOSTATIN) Apply 1 application topically 3 (three) times daily.   traMADol (ULTRAM) 50 MG tablet Take 100 mg by  mouth daily.    valsartan (DIOVAN) 160 MG tablet TAKE 1 TABLET BY MOUTH DAILY   vitamin B-12 (CYANOCOBALAMIN) 1000 MCG tablet Take 1,000 mcg by mouth daily.   No facility-administered encounter medications on file as of 06/17/2022.    Surgical History: Past Surgical History:  Procedure Laterality Date   CARDIAC CATHETERIZATION  1989   CARDIAC CATHETERIZATION  1990   GALLBLADDER SURGERY     LEG SURGERY Right    stent placement    Medical History: Past Medical History:  Diagnosis Date   Coronary artery disease    a. 1989 s/p MI w/ PTCA to unknown vessel. Reports cath 6 months later with occluded vessel and collaterals--> medically managed since..   Diabetes mellitus without complication (HCC)    Hyperlipidemia LDL goal <70    Hypertension    a. 02/2020 nl renal artery duplex.   MI (myocardial infarction) (HCC)    PAD (peripheral artery disease) (HCC)    a. 12/2019 ABIs: R 1.02, L 0.56-->unchanged.   Sleep apnea     Family History: Family History  Problem Relation Age of Onset   Diabetes Mother    Brain cancer Mother    Throat cancer Father    Prostate cancer Brother     Social History   Socioeconomic History   Marital status: Married    Spouse name: Not on file   Number of children: Not on file   Years of education: Not on file   Highest education level: Not on file  Occupational History   Not on file  Tobacco Use   Smoking status: Former    Packs/day: 3.00    Years: 32.00    Additional pack years: 0.00    Total pack years: 96.00    Types: Cigarettes   Smokeless tobacco: Never  Vaping Use   Vaping Use: Never used  Substance and Sexual Activity   Alcohol use: No   Drug use: No   Sexual activity: Not on file  Other Topics Concern   Not on file  Social History Narrative   Not on file   Social Determinants of Health   Financial Resource Strain: Not on file  Food Insecurity: Not on file  Transportation Needs: Not on file  Physical Activity: Not on file   Stress: Not on file  Social Connections: Not on file  Intimate Partner Violence: Not on file      Review of Systems  Constitutional:  Negative for chills, fatigue and unexpected weight change.  HENT:  Negative for congestion, postnasal drip, rhinorrhea, sneezing and sore throat.   Eyes:  Negative for redness.  Respiratory:  Negative for cough, chest tightness and shortness of breath.   Cardiovascular:  Negative for chest pain and palpitations.  Gastrointestinal:  Negative for abdominal pain, constipation, diarrhea, nausea and vomiting.  Genitourinary:  Negative for dysuria and frequency.  Musculoskeletal:  Positive for arthralgias and gait problem. Negative for back pain, joint swelling and neck pain.  Skin:  Positive for wound. Negative for rash.  Neurological:  Negative for tremors and numbness.  Hematological:  Negative for adenopathy. Does not bruise/bleed easily.  Psychiatric/Behavioral:  Negative for behavioral problems (Depression), sleep disturbance and suicidal ideas. The patient is not nervous/anxious.     Vital Signs: BP 123/60   Pulse 74   Temp 98 F (36.7 C)   Resp 16   Ht 5\' 8"  (1.727 m)   Wt 221 lb (100.2 kg)   SpO2 97%   BMI 33.60 kg/m    Physical Exam Vitals and nursing note reviewed.  Constitutional:      Appearance: Normal appearance. He is obese.  HENT:     Head: Normocephalic and atraumatic.     Nose: Nose normal.     Mouth/Throat:     Mouth: Mucous membranes are moist.     Pharynx: No posterior oropharyngeal erythema.  Eyes:     Extraocular Movements: Extraocular movements intact.     Pupils: Pupils are equal, round, and reactive to light.  Cardiovascular:     Rate and Rhythm: Normal rate and regular rhythm.     Pulses: Normal pulses.     Heart sounds: Normal heart sounds.  Pulmonary:     Effort: Pulmonary effort is normal.     Breath sounds: Normal breath sounds.  Musculoskeletal:        General: Tenderness and signs of injury present.  No deformity.     Comments: Right shoulder with reduced ROM and pain with reaching back  Skin:    General: Skin is warm and dry.     Findings: Lesion present.     Comments: Right arm laceration with scabbing, no evidence of infection at this time  Neurological:     General: No focal deficit present.     Mental Status: He is alert.  Psychiatric:        Mood and Affect: Mood normal.        Behavior: Behavior normal.        Assessment/Plan: 1. Essential hypertension Well controlled, continue current medications  2. Injury of right shoulder, subsequent encounter Will refer to ortho due to continued right shoulder pain after injury with limited ROM - Ambulatory referral to Orthopedics  3. Laceration of right upper extremity, initial encounter Continue to keep clean and monitor for signs of infection and call office if worsening. Patient is on ABX for toe already and will complete course  4. Type 2 diabetes mellitus with other circulatory complication, with long-term current use of insulin (HCC) Followed by endocrinology   General Counseling: Cranston Neighbor understanding of the findings of todays visit and agrees with plan of treatment. I have discussed any further diagnostic evaluation that may be needed or ordered today. We also reviewed his medications today. he has been encouraged to call the office with any questions or concerns that should arise related to todays visit.    Orders Placed This Encounter  Procedures   Ambulatory referral to Orthopedics    No orders of the defined types were placed in this encounter.   This patient was seen by Lynn Ito, PA-C in collaboration with Dr. Beverely Risen as a part of collaborative care agreement.   Total time spent:30 Minutes Time spent includes review of chart, medications, test results, and follow up plan with the patient.  Dr Lavera Guise Internal medicine

## 2022-06-18 ENCOUNTER — Ambulatory Visit: Payer: Medicare Other | Admitting: Physician Assistant

## 2022-06-22 ENCOUNTER — Ambulatory Visit: Payer: Medicare Other | Admitting: Internal Medicine

## 2022-06-24 ENCOUNTER — Telehealth: Payer: Self-pay | Admitting: Physician Assistant

## 2022-06-24 NOTE — Telephone Encounter (Signed)
Orthopedic referral sent via [facility] to [name] 

## 2022-06-25 ENCOUNTER — Telehealth: Payer: Self-pay | Admitting: Physician Assistant

## 2022-06-25 NOTE — Telephone Encounter (Signed)
Orthopedic appointment>> 06/30/2022 @ [name]

## 2022-06-28 ENCOUNTER — Ambulatory Visit (INDEPENDENT_AMBULATORY_CARE_PROVIDER_SITE_OTHER): Payer: Medicare Other

## 2022-06-28 DIAGNOSIS — Z79899 Other long term (current) drug therapy: Secondary | ICD-10-CM | POA: Diagnosis not present

## 2022-06-28 DIAGNOSIS — R55 Syncope and collapse: Secondary | ICD-10-CM | POA: Diagnosis not present

## 2022-06-28 DIAGNOSIS — M5136 Other intervertebral disc degeneration, lumbar region: Secondary | ICD-10-CM | POA: Diagnosis not present

## 2022-06-28 DIAGNOSIS — M48062 Spinal stenosis, lumbar region with neurogenic claudication: Secondary | ICD-10-CM | POA: Diagnosis not present

## 2022-06-28 DIAGNOSIS — M5416 Radiculopathy, lumbar region: Secondary | ICD-10-CM | POA: Diagnosis not present

## 2022-06-30 DIAGNOSIS — M24811 Other specific joint derangements of right shoulder, not elsewhere classified: Secondary | ICD-10-CM | POA: Diagnosis not present

## 2022-06-30 DIAGNOSIS — M19011 Primary osteoarthritis, right shoulder: Secondary | ICD-10-CM | POA: Diagnosis not present

## 2022-06-30 DIAGNOSIS — M25511 Pain in right shoulder: Secondary | ICD-10-CM | POA: Diagnosis not present

## 2022-07-05 LAB — CUP PACEART REMOTE DEVICE CHECK
Date Time Interrogation Session: 20240520080300
Implantable Pulse Generator Implant Date: 20240410
Pulse Gen Serial Number: 109944

## 2022-07-07 ENCOUNTER — Ambulatory Visit: Payer: Medicare Other | Admitting: Podiatry

## 2022-07-23 NOTE — Progress Notes (Signed)
[facility] LOOP RECORDER  

## 2022-07-29 ENCOUNTER — Ambulatory Visit (INDEPENDENT_AMBULATORY_CARE_PROVIDER_SITE_OTHER): Payer: Medicare Other

## 2022-07-29 DIAGNOSIS — R55 Syncope and collapse: Secondary | ICD-10-CM

## 2022-08-04 LAB — CUP PACEART REMOTE DEVICE CHECK
Date Time Interrogation Session: 20240617142200
Implantable Pulse Generator Implant Date: 20240410
Pulse Gen Serial Number: 109944

## 2022-08-09 ENCOUNTER — Other Ambulatory Visit: Payer: Self-pay | Admitting: Physician Assistant

## 2022-08-11 ENCOUNTER — Ambulatory Visit (INDEPENDENT_AMBULATORY_CARE_PROVIDER_SITE_OTHER): Payer: Medicare Other | Admitting: Podiatry

## 2022-08-11 ENCOUNTER — Encounter: Payer: Self-pay | Admitting: Podiatry

## 2022-08-11 DIAGNOSIS — B351 Tinea unguium: Secondary | ICD-10-CM | POA: Diagnosis not present

## 2022-08-11 DIAGNOSIS — Z7729 Contact with and (suspected ) exposure to other hazardous substances: Secondary | ICD-10-CM | POA: Insufficient documentation

## 2022-08-11 DIAGNOSIS — E1159 Type 2 diabetes mellitus with other circulatory complications: Secondary | ICD-10-CM | POA: Diagnosis not present

## 2022-08-11 DIAGNOSIS — M79676 Pain in unspecified toe(s): Secondary | ICD-10-CM

## 2022-08-11 DIAGNOSIS — E1142 Type 2 diabetes mellitus with diabetic polyneuropathy: Secondary | ICD-10-CM | POA: Insufficient documentation

## 2022-08-11 DIAGNOSIS — G4733 Obstructive sleep apnea (adult) (pediatric): Secondary | ICD-10-CM | POA: Insufficient documentation

## 2022-08-11 DIAGNOSIS — Z794 Long term (current) use of insulin: Secondary | ICD-10-CM | POA: Diagnosis not present

## 2022-08-11 DIAGNOSIS — H52229 Regular astigmatism, unspecified eye: Secondary | ICD-10-CM | POA: Insufficient documentation

## 2022-08-13 NOTE — Progress Notes (Signed)
Subjective:  Patient ID: Allen [name], male    DOB: 07-Dec-1940,  MRN: [id number] HPI Chief Complaint  Patient presents with   Diabetes    Foot exam - last A1c was 8.2, would like toenails trimmed, also has circulation issues, getting ready to have stent in left leg, right has already been done   New Patient (Initial Visit)    82 y.o. male presents with the above complaint.   ROS: Denies fever chills nausea vomit muscle aches pains calf pain back pain chest pain shortness of breath.  Past Medical History:  Diagnosis Date   Coronary artery disease    a. 1989 s/p MI w/ PTCA to unknown vessel. Reports cath 6 months later with occluded vessel and collaterals--> medically managed since..   Diabetes mellitus without complication (HCC)    Hyperlipidemia LDL goal <70    Hypertension    a. 02/2020 nl renal artery duplex.   MI (myocardial infarction) (HCC)    PAD (peripheral artery disease) (HCC)    a. 12/2019 ABIs: R 1.02, L 0.56-->unchanged.   Sleep apnea    Past Surgical History:  Procedure Laterality Date   CARDIAC CATHETERIZATION  1989   CARDIAC CATHETERIZATION  1990   GALLBLADDER SURGERY     LEG SURGERY Right    stent placement    Current Outpatient Medications:    meloxicam (MOBIC) 15 MG tablet, Take 15 mg by mouth daily., Disp: , Rfl:    aspirin 81 MG chewable tablet, Chew by mouth daily., Disp: , Rfl:    b complex vitamins tablet, Take 1 tablet by mouth daily., Disp: , Rfl:    B-D ULTRA-FINE 33 LANCETS MISC, Use 1 Lancet 3 (three) times daily., Disp: , Rfl:    cetirizine (ZYRTEC) 10 MG tablet, Take 10 mg by mouth daily., Disp: , Rfl:    Cholecalciferol (VITAMIN D3) 1000 units CAPS, Take by mouth., Disp: , Rfl:    Continuous Blood Gluc Sensor (FREESTYLE LIBRE 14 DAY SENSOR) MISC, Use 1 kit every 14 (fourteen) days, Disp: 2 each, Rfl: 11   Continuous Blood Gluc Sensor (FREESTYLE LIBRE SENSOR SYSTEM) MISC, Use 3 each every 10 (ten) days., Disp: , Rfl:    empagliflozin (JARDIANCE)  25 MG TABS tablet, Take 1 tablet (25 mg total) by mouth daily before breakfast., Disp: 90 tablet, Rfl: 3   ezetimibe-simvastatin (VYTORIN) 10-40 MG tablet, TAKE 1 TABLET BY MOUTH DAILY, Disp: 90 tablet, Rfl: 1   folic acid (FOLVITE) 400 MCG tablet, Take 400 mcg by mouth daily., Disp: , Rfl:    Garlic 1000 MG CAPS, Take by mouth., Disp: , Rfl:    glucose blood test strip, Check blood sugar 5x a day as directed, Disp: , Rfl:    insulin aspart (NOVOLOG) 100 UNIT/ML FlexPen, INJECT 10 UNITS UNDER SKIN TWICE DAILY BEFORE MORNING AND EVENING MEAL FOR DIABETES.DISCARD PEN 28 DAYS AFTER OPENING PLUS SLIDING SCALE, Disp: , Rfl:    Insulin Glargine (BASAGLAR KWIKPEN) 100 UNIT/ML, ADMINISTER 26 TO 40 UNITS UNDER THE SKIN EVERY DAY, Disp: 15 mL, Rfl: 3   Insulin Pen Needle 32G X 4 MM MISC, USE AS DIRECTED FOUR TIMES A DAY WITH LEVEMIR FLEXTOUCH AND HUMALOG PEN.  DX CODE E11.29, Disp: 100 each, Rfl: 3   Insulin Syringe-Needle U-100 (INSULIN SYRINGE 1CC/31GX5/16") 31G X 5/16" 1 ML MISC, Use as directed with insulin, Disp: 100 each, Rfl: 3   Insulin Syringes, Disposable, U-100 1 ML MISC, by Does not apply route., Disp: , Rfl:    levothyroxine (  SYNTHROID) 75 MCG tablet, TAKE 1 TABLET BY MOUTH DAILY BEFORE BREAKFAST, Disp: 90 tablet, Rfl: 3   metFORMIN (GLUCOPHAGE) 500 MG tablet, TAKE 1 TABLET(500 MG) BY MOUTH TWICE DAILY WITH A MEAL, Disp: 180 tablet, Rfl: 1   metoprolol succinate (TOPROL-XL) 25 MG 24 hr tablet, TAKE 1 TABLET(25 MG) BY MOUTH DAILY (Patient taking differently: Take 12.5 mg by mouth daily.), Disp: 90 tablet, Rfl: 1   Multiple Vitamin (MULTIVITAMIN WITH MINERALS) TABS tablet, Take 1 tablet by mouth daily., Disp: , Rfl:    mupirocin ointment (BACTROBAN) 2 %, Apply 1 Application topically daily. To affected area until healed, Disp: 30 g, Rfl: 2   nystatin ointment (MYCOSTATIN), Apply 1 application topically 3 (three) times daily., Disp: 30 g, Rfl: 2   traMADol (ULTRAM) 50 MG tablet, Take 100 mg by mouth  daily. , Disp: , Rfl:    valsartan (DIOVAN) 160 MG tablet, TAKE 1 TABLET BY MOUTH DAILY, Disp: 90 tablet, Rfl: 3   vitamin B-12 (CYANOCOBALAMIN) 1000 MCG tablet, Take 1,000 mcg by mouth daily., Disp: , Rfl:   Allergies  Allergen Reactions   Iodinated Contrast Media Anaphylaxis and Rash    TOLERATED CT CONTRAST ON 01/07/2018 WITH NO ISSUES AFTER 10 MINUTE PRETREATMENT X-Ray contrast dyes- anaphylaxis & rash X-Ray contrast dyes- anaphylaxis & rash  X-Ray contrast dyes- anaphylaxis & rash TOLERATED CT CONTRAST ON 01/07/2018 WITH NO ISSUES AFTER 10 MINUTE PRETREATMENT X-Ray contrast dyes- anaphylaxis & rash X-Ray contrast dyes- anaphylaxis & rash   Iohexol Hives and Shortness Of Breath    Pt states that he had IV dye for a kidney x-ray years ago and after injection of the dye he had hives all over his body, throat swelling, and SOB. Pt states that he had IV dye for a kidney x-ray years ago and after injection of the dye he had hives all over his body, throat swelling, and SOB. Pt states that he had IV dye for a kidney x-ray years ago and after injection of the dye he had hives all over his body, throat swelling, and SOB.   Duloxetine Other (See Comments)    Sick to stomach and chills   Gabapentin Other (See Comments)   Penicillins Other (See Comments)   Other Rash   Review of Systems Objective:  There were no vitals filed for this visit.  General: Well developed, nourished, in no acute distress, alert and oriented x3   Dermatological: Skin is warm, dry and supple bilateral. Nails x  thick yellow dystrophic clinically mycotic; remaining integument appears unremarkable at this time. There are no open sores, no preulcerative lesions, no rash or signs of infection present.  Vascular: Dorsalis Pedis artery and Posterior Tibial artery pedal pulses are 1/4 bilateral with immedate capillary fill time. Pedal hair growth present. No varicosities and mild lower extremity edema present bilateral.    Neruologic: Grossly intact via light touch bilateral. Vibratory intact via tuning fork bilateral. Protective threshold with Semmes Wienstein monofilament severely diminished to all pedal sites bilateral. Patellar and Achilles deep tendon reflexes 2+ bilateral. No Babinski or clonus noted bilateral.   Musculoskeletal: No gross boney pedal deformities bilateral. No pain, crepitus, or limitation noted with foot and ankle range of motion bilateral. Muscular strength 5/5 in all groups tested bilateral.  Gait: Unassisted, Nonantalgic.    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  Currently no open lesions or wounds.  Onychomycosis.  Plan: Discussed  etiology pathology conservative surgical therapies discussed appropriate protocol for diabetes and diabetic foot exams.  Discussed nail debridement and complications associated with diabetic peripheral neuropathy.  Debrided nails bilaterally.     Taelor Moncada T. [place], [place]

## 2022-08-23 NOTE — Progress Notes (Signed)
Carelink Summary Report / Loop Recorder 

## 2022-08-25 DIAGNOSIS — H26491 Other secondary cataract, right eye: Secondary | ICD-10-CM | POA: Diagnosis not present

## 2022-08-25 DIAGNOSIS — E11319 Type 2 diabetes mellitus with unspecified diabetic retinopathy without macular edema: Secondary | ICD-10-CM | POA: Diagnosis not present

## 2022-08-25 DIAGNOSIS — H2640 Unspecified secondary cataract: Secondary | ICD-10-CM | POA: Diagnosis not present

## 2022-08-25 DIAGNOSIS — E113393 Type 2 diabetes mellitus with moderate nonproliferative diabetic retinopathy without macular edema, bilateral: Secondary | ICD-10-CM | POA: Diagnosis not present

## 2022-08-31 ENCOUNTER — Other Ambulatory Visit: Payer: Self-pay | Admitting: Physician Assistant

## 2022-10-27 ENCOUNTER — Other Ambulatory Visit: Payer: Self-pay | Admitting: Internal Medicine

## 2022-10-27 DIAGNOSIS — E1122 Type 2 diabetes mellitus with diabetic chronic kidney disease: Secondary | ICD-10-CM

## 2022-11-02 ENCOUNTER — Telehealth: Payer: Self-pay | Admitting: Cardiovascular Disease

## 2022-11-02 ENCOUNTER — Other Ambulatory Visit: Payer: Self-pay | Admitting: Physician Assistant

## 2022-11-02 ENCOUNTER — Other Ambulatory Visit: Payer: Self-pay

## 2022-11-02 ENCOUNTER — Telehealth: Payer: Self-pay | Admitting: Physician Assistant

## 2022-11-02 MED ORDER — METOPROLOL SUCCINATE ER 25 MG PO TB24: 12.5000 mg | ORAL_TABLET | Freq: Every day | ORAL | 1 refills | Status: DC

## 2022-11-02 NOTE — Telephone Encounter (Signed)
Lmom to pt call us about metoprolol dosing and how many tab taking a day

## 2022-11-02 NOTE — Telephone Encounter (Signed)
Called pt to inform him that he needed to contact his PCP for a refill for his medication metoprolol. Pt's PCP follows pt for this medication. I advised the pt that if he has any other problems, questions or concerns, to give our office a call back. Pt verbalized understanding.

## 2022-11-02 NOTE — Telephone Encounter (Signed)
*  STAT* If patient is at the pharmacy, call can be transferred to refill team.   1. Which medications need to be refilled? (please list name of each medication and dose if known) metoprolol succinate (TOPROL-XL) 25 MG 24 hr tablet   2. Which pharmacy/location (including street and city if local pharmacy) is medication to be sent to? [id number] - [place], [place] - [address] AT [address] OF [address]    3. Do they need a 30 day or 90 day supply? 90 day

## 2022-11-03 ENCOUNTER — Other Ambulatory Visit: Payer: Self-pay | Admitting: Physician Assistant

## 2022-11-15 ENCOUNTER — Ambulatory Visit (INDEPENDENT_AMBULATORY_CARE_PROVIDER_SITE_OTHER): Payer: Medicare Other

## 2022-11-15 DIAGNOSIS — R55 Syncope and collapse: Secondary | ICD-10-CM

## 2022-11-16 LAB — CUP PACEART REMOTE DEVICE CHECK
Date Time Interrogation Session: 20240930131800
Implantable Pulse Generator Implant Date: 20240410
Pulse Gen Serial Number: 109944

## 2022-11-20 ENCOUNTER — Other Ambulatory Visit: Payer: Self-pay | Admitting: Physician Assistant

## 2022-11-22 ENCOUNTER — Ambulatory Visit: Payer: Medicare Other | Admitting: Podiatry

## 2022-11-24 DIAGNOSIS — M5417 Radiculopathy, lumbosacral region: Secondary | ICD-10-CM | POA: Diagnosis not present

## 2022-11-24 DIAGNOSIS — M4306 Spondylolysis, lumbar region: Secondary | ICD-10-CM | POA: Diagnosis not present

## 2022-11-24 DIAGNOSIS — M5416 Radiculopathy, lumbar region: Secondary | ICD-10-CM | POA: Diagnosis not present

## 2022-11-24 DIAGNOSIS — M9903 Segmental and somatic dysfunction of lumbar region: Secondary | ICD-10-CM | POA: Diagnosis not present

## 2022-11-25 DIAGNOSIS — M9903 Segmental and somatic dysfunction of lumbar region: Secondary | ICD-10-CM | POA: Diagnosis not present

## 2022-11-25 DIAGNOSIS — M4306 Spondylolysis, lumbar region: Secondary | ICD-10-CM | POA: Diagnosis not present

## 2022-11-25 DIAGNOSIS — M5416 Radiculopathy, lumbar region: Secondary | ICD-10-CM | POA: Diagnosis not present

## 2022-11-25 DIAGNOSIS — M5417 Radiculopathy, lumbosacral region: Secondary | ICD-10-CM | POA: Diagnosis not present

## 2022-11-26 ENCOUNTER — Telehealth: Payer: Self-pay

## 2022-11-26 DIAGNOSIS — M9903 Segmental and somatic dysfunction of lumbar region: Secondary | ICD-10-CM | POA: Diagnosis not present

## 2022-11-26 DIAGNOSIS — M5416 Radiculopathy, lumbar region: Secondary | ICD-10-CM | POA: Diagnosis not present

## 2022-11-26 DIAGNOSIS — M4306 Spondylolysis, lumbar region: Secondary | ICD-10-CM | POA: Diagnosis not present

## 2022-11-26 DIAGNOSIS — M5417 Radiculopathy, lumbosacral region: Secondary | ICD-10-CM | POA: Diagnosis not present

## 2022-11-26 NOTE — Telephone Encounter (Signed)
ILR alert for pause Event occurred 10/10 @ 14:03, 4.3sec per device, heart block Route to triage LA, CVRS  ILR FOR SYNCOPE:  Daytime pause Patient states he was at the [facility] for MD visit at the time of event yesterday.  He says at this time they were checking his bp which momentarily dropped to 60's/40s per his report then by end of visit was back up to 140's/60's. States his [facility] made no mention or changes regarding this event during visit.   He did not have any symptoms during this event and feels fine at present.  No changes to diet, health or meds otherwise.   Forwarding to Dr. Lalla Brothers as this appears patient's first daytime pause event since implant.  Patient knows if anything further we will call him back after physician reviews.   He has ER precautions if any concerning symptoms including syncope/collapse.

## 2022-11-29 DIAGNOSIS — M5416 Radiculopathy, lumbar region: Secondary | ICD-10-CM | POA: Diagnosis not present

## 2022-11-29 DIAGNOSIS — M4306 Spondylolysis, lumbar region: Secondary | ICD-10-CM | POA: Diagnosis not present

## 2022-11-29 DIAGNOSIS — M9903 Segmental and somatic dysfunction of lumbar region: Secondary | ICD-10-CM | POA: Diagnosis not present

## 2022-11-29 DIAGNOSIS — M5417 Radiculopathy, lumbosacral region: Secondary | ICD-10-CM | POA: Diagnosis not present

## 2022-11-29 NOTE — Progress Notes (Signed)
[place] LOOP RECORDER

## 2022-11-30 DIAGNOSIS — M5416 Radiculopathy, lumbar region: Secondary | ICD-10-CM | POA: Diagnosis not present

## 2022-11-30 DIAGNOSIS — M9903 Segmental and somatic dysfunction of lumbar region: Secondary | ICD-10-CM | POA: Diagnosis not present

## 2022-11-30 DIAGNOSIS — M5417 Radiculopathy, lumbosacral region: Secondary | ICD-10-CM | POA: Diagnosis not present

## 2022-11-30 DIAGNOSIS — M4306 Spondylolysis, lumbar region: Secondary | ICD-10-CM | POA: Diagnosis not present

## 2022-12-03 DIAGNOSIS — M9903 Segmental and somatic dysfunction of lumbar region: Secondary | ICD-10-CM | POA: Diagnosis not present

## 2022-12-03 DIAGNOSIS — M5417 Radiculopathy, lumbosacral region: Secondary | ICD-10-CM | POA: Diagnosis not present

## 2022-12-03 DIAGNOSIS — M5416 Radiculopathy, lumbar region: Secondary | ICD-10-CM | POA: Diagnosis not present

## 2022-12-03 DIAGNOSIS — M4306 Spondylolysis, lumbar region: Secondary | ICD-10-CM | POA: Diagnosis not present

## 2022-12-06 DIAGNOSIS — M5417 Radiculopathy, lumbosacral region: Secondary | ICD-10-CM | POA: Diagnosis not present

## 2022-12-06 DIAGNOSIS — M5416 Radiculopathy, lumbar region: Secondary | ICD-10-CM | POA: Diagnosis not present

## 2022-12-06 DIAGNOSIS — M9903 Segmental and somatic dysfunction of lumbar region: Secondary | ICD-10-CM | POA: Diagnosis not present

## 2022-12-06 DIAGNOSIS — M4306 Spondylolysis, lumbar region: Secondary | ICD-10-CM | POA: Diagnosis not present

## 2022-12-08 DIAGNOSIS — M5416 Radiculopathy, lumbar region: Secondary | ICD-10-CM | POA: Diagnosis not present

## 2022-12-08 DIAGNOSIS — M4306 Spondylolysis, lumbar region: Secondary | ICD-10-CM | POA: Diagnosis not present

## 2022-12-08 DIAGNOSIS — M5417 Radiculopathy, lumbosacral region: Secondary | ICD-10-CM | POA: Diagnosis not present

## 2022-12-08 DIAGNOSIS — M9903 Segmental and somatic dysfunction of lumbar region: Secondary | ICD-10-CM | POA: Diagnosis not present

## 2022-12-10 DIAGNOSIS — M5416 Radiculopathy, lumbar region: Secondary | ICD-10-CM | POA: Diagnosis not present

## 2022-12-10 DIAGNOSIS — M9903 Segmental and somatic dysfunction of lumbar region: Secondary | ICD-10-CM | POA: Diagnosis not present

## 2022-12-10 DIAGNOSIS — M4306 Spondylolysis, lumbar region: Secondary | ICD-10-CM | POA: Diagnosis not present

## 2022-12-10 DIAGNOSIS — M5417 Radiculopathy, lumbosacral region: Secondary | ICD-10-CM | POA: Diagnosis not present

## 2022-12-13 DIAGNOSIS — M4306 Spondylolysis, lumbar region: Secondary | ICD-10-CM | POA: Diagnosis not present

## 2022-12-13 DIAGNOSIS — M9903 Segmental and somatic dysfunction of lumbar region: Secondary | ICD-10-CM | POA: Diagnosis not present

## 2022-12-13 DIAGNOSIS — M5417 Radiculopathy, lumbosacral region: Secondary | ICD-10-CM | POA: Diagnosis not present

## 2022-12-13 DIAGNOSIS — M5416 Radiculopathy, lumbar region: Secondary | ICD-10-CM | POA: Diagnosis not present

## 2022-12-15 DIAGNOSIS — M9903 Segmental and somatic dysfunction of lumbar region: Secondary | ICD-10-CM | POA: Diagnosis not present

## 2022-12-15 DIAGNOSIS — M4306 Spondylolysis, lumbar region: Secondary | ICD-10-CM | POA: Diagnosis not present

## 2022-12-15 DIAGNOSIS — M5417 Radiculopathy, lumbosacral region: Secondary | ICD-10-CM | POA: Diagnosis not present

## 2022-12-15 DIAGNOSIS — M5416 Radiculopathy, lumbar region: Secondary | ICD-10-CM | POA: Diagnosis not present

## 2022-12-16 ENCOUNTER — Ambulatory Visit: Payer: Medicare Other | Admitting: Physician Assistant

## 2022-12-16 ENCOUNTER — Other Ambulatory Visit: Payer: Self-pay | Admitting: Nurse Practitioner

## 2022-12-16 ENCOUNTER — Encounter: Payer: Self-pay | Admitting: Physician Assistant

## 2022-12-16 VITALS — BP 135/60 | HR 75 | Temp 98.3°F | Resp 16 | Ht 68.0 in | Wt 217.0 lb

## 2022-12-16 DIAGNOSIS — R6889 Other general symptoms and signs: Secondary | ICD-10-CM | POA: Diagnosis not present

## 2022-12-16 DIAGNOSIS — Z0000 Encounter for general adult medical examination without abnormal findings: Secondary | ICD-10-CM | POA: Diagnosis not present

## 2022-12-16 DIAGNOSIS — E038 Other specified hypothyroidism: Secondary | ICD-10-CM | POA: Diagnosis not present

## 2022-12-16 DIAGNOSIS — Z0001 Encounter for general adult medical examination with abnormal findings: Secondary | ICD-10-CM | POA: Diagnosis not present

## 2022-12-16 LAB — MICROALBUMIN, URINE

## 2022-12-16 NOTE — Progress Notes (Signed)
[facility] [address] [place], [place] [address]  Internal MEDICINE  Office Visit Note  Patient Name: Allen [name]  [id number]  [id number]  Date of Service: 12/16/2022  Chief Complaint  Patient presents with   Medicare Wellness   Diabetes   Hypertension   Hyperlipidemia    HPI Daquane presents for an annual well visit and physical exam.  Well-appearing 82 y.o. male  Eye exam and/or foot exam: had eye exam this past summer, foot exam in Nov Labs: had urine microalbumin, A1c, lipids, and metabolic panel done via the [place] already, but will need thyroid and CBC checked--given lab slip -still having left shoulder pain and is seeing ortho/chiropractor for this already -following with cardiology, has loop recorder in place.  -Endo starting him on GLP1 weekly, states unfortunately they are very busy and next appt is not until June currently, but his medications are being adjusted still     12/16/2022    2:03 PM 12/10/2021    2:11 PM 12/04/2020    2:17 PM  MMSE - Mini Mental State Exam  Orientation to time 4 5 5   Orientation to Place 5 5 5   Registration 3 3 3   Attention/ Calculation 5 5 5   Recall 3 3 3   Language- name 2 objects 2 2 2   Language- repeat 1 1 1   Language- follow 3 step command 3 3 3   Language- read & follow direction 1 1 1   Write a sentence 1 1 1   Copy design 1 1 1   Total score 29 30 30     Functional Status Survey: Is the patient deaf or have difficulty hearing?: Yes Does the patient have difficulty seeing, even when wearing glasses/contacts?: No Does the patient have difficulty concentrating, remembering, or making decisions?: No Does the patient have difficulty walking or climbing stairs?: No Does the patient have difficulty dressing or bathing?: No Does the patient have difficulty doing errands alone such as visiting a doctor's office or shopping?: No     08/17/2021   11:44 AM 12/10/2021    2:03 PM 01/23/2022    8:27 PM 06/17/2022   11:43 AM  12/16/2022    2:02 PM  Fall Risk  Falls in the past year? 1 0  1 0  Was there an injury with Fall? 0   1   Fall Risk Category Calculator 1   2   Fall Risk Category (Retired) Low      (RETIRED) Patient Fall Risk Level Low fall risk  Moderate fall risk    Patient at Risk for Falls Due to    History of fall(s)   Fall risk Follow up Falls evaluation completed   Falls evaluation completed        12/16/2022    2:02 PM  Depression screen PHQ 2/9  Decreased Interest 0  Down, Depressed, Hopeless 0  PHQ - 2 Score 0        No data to display            Current Medication: Outpatient Encounter Medications as of 12/16/2022  Medication Sig   aspirin 81 MG chewable tablet Chew by mouth daily.   b complex vitamins tablet Take 1 tablet by mouth daily.   B-D ULTRA-FINE 33 LANCETS MISC Use 1 Lancet 3 (three) times daily.   BD PEN NEEDLE NANO 2ND GEN 32G X 4 MM MISC USE AS DIRECTED FOUR TIMES DAILY WITH LEVEMIR FLEXTOUCH AND HUMALOG PEN   cetirizine (ZYRTEC) 10 MG tablet Take 10 mg  by mouth daily.   Cholecalciferol (VITAMIN D3) 1000 units CAPS Take by mouth.   Continuous Blood Gluc Sensor (FREESTYLE LIBRE 14 DAY SENSOR) MISC Use 1 kit every 14 (fourteen) days   Continuous Blood Gluc Sensor (FREESTYLE LIBRE SENSOR SYSTEM) MISC Use 3 each every 10 (ten) days.   empagliflozin (JARDIANCE) 25 MG TABS tablet Take 1 tablet (25 mg total) by mouth daily before breakfast.   ezetimibe-simvastatin (VYTORIN) 10-40 MG tablet TAKE 1 TABLET BY MOUTH DAILY   folic acid (FOLVITE) 400 MCG tablet Take 400 mcg by mouth daily.   Garlic 1000 MG CAPS Take by mouth.   glucose blood test strip Check blood sugar 5x a day as directed   insulin aspart (NOVOLOG) 100 UNIT/ML FlexPen INJECT 10 UNITS UNDER SKIN TWICE DAILY BEFORE MORNING AND EVENING MEAL FOR DIABETES.DISCARD PEN 28 DAYS AFTER OPENING PLUS SLIDING SCALE   Insulin Glargine (BASAGLAR KWIKPEN) 100 UNIT/ML ADMINISTER 26 TO 40 UNITS UNDER THE SKIN EVERY DAY    Insulin Syringe-Needle U-100 (INSULIN SYRINGE 1CC/31GX5/16") 31G X 5/16" 1 ML MISC Use as directed with insulin   Insulin Syringes, Disposable, U-100 1 ML MISC by Does not apply route.   levothyroxine (SYNTHROID) 75 MCG tablet TAKE 1 TABLET BY MOUTH DAILY BEFORE BREAKFAST   meloxicam (MOBIC) 15 MG tablet Take 15 mg by mouth daily.   metFORMIN (GLUCOPHAGE) 500 MG tablet TAKE 1 TABLET(500 MG) BY MOUTH TWICE DAILY WITH A MEAL   metoprolol succinate (TOPROL-XL) 25 MG 24 hr tablet Take 0.5 tablets (12.5 mg total) by mouth daily.   Multiple Vitamin (MULTIVITAMIN WITH MINERALS) TABS tablet Take 1 tablet by mouth daily.   mupirocin ointment (BACTROBAN) 2 % Apply 1 Application topically daily. To affected area until healed   nystatin ointment (MYCOSTATIN) Apply 1 application topically 3 (three) times daily.   traMADol (ULTRAM) 50 MG tablet Take 100 mg by mouth daily.    valsartan (DIOVAN) 160 MG tablet TAKE 1 TABLET BY MOUTH DAILY   vitamin B-12 (CYANOCOBALAMIN) 1000 MCG tablet Take 1,000 mcg by mouth daily.   No facility-administered encounter medications on file as of 12/16/2022.    Surgical History: Past Surgical History:  Procedure Laterality Date   CARDIAC CATHETERIZATION  1989   CARDIAC CATHETERIZATION  1990   GALLBLADDER SURGERY     LEG SURGERY Right    stent placement    Medical History: Past Medical History:  Diagnosis Date   Coronary artery disease    a. 1989 s/p MI w/ PTCA to unknown vessel. Reports cath 6 months later with occluded vessel and collaterals--> medically managed since..   Diabetes mellitus without complication (HCC)    Hyperlipidemia LDL goal <70    Hypertension    a. 02/2020 nl renal artery duplex.   MI (myocardial infarction) (HCC)    PAD (peripheral artery disease) (HCC)    a. 12/2019 ABIs: R 1.02, L 0.56-->unchanged.   Sleep apnea     Family History: Family History  Problem Relation Age of Onset   Diabetes Mother    Brain cancer Mother    Throat cancer  Father    Prostate cancer Brother     Social History   Socioeconomic History   Marital status: Married    Spouse name: Not on file   Number of children: Not on file   Years of education: Not on file   Highest education level: Not on file  Occupational History   Not on file  Tobacco Use   Smoking status: Former  Current packs/day: 3.00    Average packs/day: 3.0 packs/day for 32.0 years (96.0 ttl pk-yrs)    Types: Cigarettes   Smokeless tobacco: Never  Vaping Use   Vaping status: Never Used  Substance and Sexual Activity   Alcohol use: No   Drug use: No   Sexual activity: Not on file  Other Topics Concern   Not on file  Social History Narrative   Not on file   Social Determinants of Health   Financial Resource Strain: Not on file  Food Insecurity: Not on file  Transportation Needs: Not on file  Physical Activity: Not on file  Stress: Not on file  Social Connections: Not on file  Intimate Partner Violence: Not on file      Review of Systems  Constitutional:  Negative for chills, fatigue and unexpected weight change.  HENT:  Negative for congestion, postnasal drip, rhinorrhea, sneezing and sore throat.   Eyes:  Negative for redness.  Respiratory:  Negative for cough, chest tightness and shortness of breath.   Cardiovascular:  Negative for chest pain and palpitations.  Gastrointestinal:  Negative for abdominal pain, constipation, diarrhea, nausea and vomiting.  Genitourinary:  Negative for dysuria and frequency.  Musculoskeletal:  Positive for arthralgias and gait problem. Negative for back pain, joint swelling and neck pain.  Skin:  Negative for rash.  Neurological:  Negative for tremors and numbness.  Hematological:  Negative for adenopathy. Does not bruise/bleed easily.  Psychiatric/Behavioral:  Negative for behavioral problems (Depression), sleep disturbance and suicidal ideas. The patient is not nervous/anxious.     Vital Signs: BP 135/60   Pulse 75    Temp 98.3 F (36.8 C)   Resp 16   Ht 5\' 8"  (1.727 m)   Wt 217 lb (98.4 kg)   SpO2 97%   BMI 32.99 kg/m    Physical Exam Vitals and nursing note reviewed.  Constitutional:      Appearance: Normal appearance. He is obese.  HENT:     Head: Normocephalic and atraumatic.  Cardiovascular:     Rate and Rhythm: Normal rate and regular rhythm.  Pulmonary:     Effort: Pulmonary effort is normal.     Breath sounds: Normal breath sounds.  Skin:    General: Skin is warm and dry.  Neurological:     Mental Status: He is alert.  Psychiatric:        Mood and Affect: Mood normal.        Behavior: Behavior normal.        Assessment/Plan: 1. Encounter for annual wellness visit (AWV) in Medicare patient AWV performed, lab slip given for outstanding labs not done by [place] already. UTD on PHM    General Counseling: drewey holen understanding of the findings of todays visit and agrees with plan of treatment. I have discussed any further diagnostic evaluation that may be needed or ordered today. We also reviewed his medications today. he has been encouraged to call the office with any questions or concerns that should arise related to todays visit.    Orders Placed This Encounter  Procedures   Microalbumin, urine    No orders of the defined types were placed in this encounter.   Return in about 6 months (around 06/15/2023).   Total time spent:30 Minutes Time spent includes review of chart, medications, test results, and follow up plan with the patient.   [place] Controlled Substance Database was reviewed by me.  This patient was seen by Lynn Ito, PA-C in collaboration with  Dr. Beverely Risen as a part of collaborative care agreement.  Lynn Ito, PA-C Internal medicine

## 2022-12-17 LAB — CBC WITH DIFFERENTIAL/PLATELET
Basophils Absolute: 0.1 10*3/uL (ref 0.0–0.2)
Basos: 1 %
EOS (ABSOLUTE): 0.1 10*3/uL (ref 0.0–0.4)
Eos: 2 %
Hematocrit: 43.3 % (ref 37.5–51.0)
Hemoglobin: 14.2 g/dL (ref 13.0–17.7)
Immature Grans (Abs): 0 10*3/uL (ref 0.0–0.1)
Immature Granulocytes: 0 %
Lymphocytes Absolute: 1.2 10*3/uL (ref 0.7–3.1)
Lymphs: 24 %
MCH: 33.7 pg — ABNORMAL HIGH (ref 26.6–33.0)
MCHC: 32.8 g/dL (ref 31.5–35.7)
MCV: 103 fL — ABNORMAL HIGH (ref 79–97)
Monocytes Absolute: 0.4 10*3/uL (ref 0.1–0.9)
Monocytes: 7 %
Neutrophils Absolute: 3.2 10*3/uL (ref 1.4–7.0)
Neutrophils: 66 %
Platelets: 228 10*3/uL (ref 150–450)
RBC: 4.21 x10E6/uL (ref 4.14–5.80)
RDW: 11.8 % (ref 11.6–15.4)
WBC: 4.9 10*3/uL (ref 3.4–10.8)

## 2022-12-17 LAB — T4, FREE: Free T4: 1.55 ng/dL (ref 0.82–1.77)

## 2022-12-17 LAB — TSH: TSH: 2.32 u[IU]/mL (ref 0.450–4.500)

## 2022-12-20 DIAGNOSIS — M9903 Segmental and somatic dysfunction of lumbar region: Secondary | ICD-10-CM | POA: Diagnosis not present

## 2022-12-20 DIAGNOSIS — M5417 Radiculopathy, lumbosacral region: Secondary | ICD-10-CM | POA: Diagnosis not present

## 2022-12-20 DIAGNOSIS — M4306 Spondylolysis, lumbar region: Secondary | ICD-10-CM | POA: Diagnosis not present

## 2022-12-20 DIAGNOSIS — M5416 Radiculopathy, lumbar region: Secondary | ICD-10-CM | POA: Diagnosis not present

## 2022-12-22 ENCOUNTER — Encounter: Payer: Self-pay | Admitting: Nurse Practitioner

## 2022-12-22 ENCOUNTER — Ambulatory Visit (INDEPENDENT_AMBULATORY_CARE_PROVIDER_SITE_OTHER): Payer: Medicare Other | Admitting: Nurse Practitioner

## 2022-12-22 VITALS — BP 129/70 | HR 72 | Temp 98.4°F | Resp 16 | Ht 68.0 in | Wt 216.0 lb

## 2022-12-22 DIAGNOSIS — G4733 Obstructive sleep apnea (adult) (pediatric): Secondary | ICD-10-CM

## 2022-12-22 DIAGNOSIS — Z7189 Other specified counseling: Secondary | ICD-10-CM | POA: Diagnosis not present

## 2022-12-22 NOTE — Progress Notes (Unsigned)
[facility] [address] [place], [place] [address]  Internal MEDICINE  Office Visit Note  Patient Name: Allen [name]  [id number]  [id number]  Date of Service: 12/22/2022  Chief Complaint  Patient presents with   Follow-up    HPI Jomes presents for a follow-up visit for  CPAP download 95 percentile pressure 9 95th percentile leak 34.7 apnea index 1.4 /hr apnea-hypopnea index  3.4 /hr total days used  >4 hr 90 days total days used <4 hr 0 days Total compliance 100 percent  Has large leak when using nasal pillows, wants to switch nasal mask.   Current Medication: Outpatient Encounter Medications as of 12/22/2022  Medication Sig   aspirin 81 MG chewable tablet Chew by mouth daily.   b complex vitamins tablet Take 1 tablet by mouth daily.   B-D ULTRA-FINE 33 LANCETS MISC Use 1 Lancet 3 (three) times daily.   BD PEN NEEDLE NANO 2ND GEN 32G X 4 MM MISC USE AS DIRECTED FOUR TIMES DAILY WITH LEVEMIR FLEXTOUCH AND HUMALOG PEN   cetirizine (ZYRTEC) 10 MG tablet Take 10 mg by mouth daily.   Cholecalciferol (VITAMIN D3) 1000 units CAPS Take by mouth.   Continuous Blood Gluc Sensor (FREESTYLE LIBRE 14 DAY SENSOR) MISC Use 1 kit every 14 (fourteen) days   Continuous Blood Gluc Sensor (FREESTYLE LIBRE SENSOR SYSTEM) MISC Use 3 each every 10 (ten) days.   empagliflozin (JARDIANCE) 25 MG TABS tablet Take 1 tablet (25 mg total) by mouth daily before breakfast.   ezetimibe-simvastatin (VYTORIN) 10-40 MG tablet TAKE 1 TABLET BY MOUTH DAILY   folic acid (FOLVITE) 400 MCG tablet Take 400 mcg by mouth daily.   Garlic 1000 MG CAPS Take by mouth.   glucose blood test strip Check blood sugar 5x a day as directed   insulin aspart (NOVOLOG) 100 UNIT/ML FlexPen INJECT 10 UNITS UNDER SKIN TWICE DAILY BEFORE MORNING AND EVENING MEAL FOR DIABETES.DISCARD PEN 28 DAYS AFTER OPENING PLUS SLIDING SCALE   Insulin Glargine (BASAGLAR KWIKPEN) 100 UNIT/ML ADMINISTER 26 TO 40 UNITS UNDER THE SKIN EVERY  DAY   Insulin Syringe-Needle U-100 (INSULIN SYRINGE 1CC/31GX5/16") 31G X 5/16" 1 ML MISC Use as directed with insulin   Insulin Syringes, Disposable, U-100 1 ML MISC by Does not apply route.   levothyroxine (SYNTHROID) 75 MCG tablet TAKE 1 TABLET BY MOUTH DAILY BEFORE BREAKFAST   meloxicam (MOBIC) 15 MG tablet Take 15 mg by mouth daily.   metFORMIN (GLUCOPHAGE) 500 MG tablet TAKE 1 TABLET(500 MG) BY MOUTH TWICE DAILY WITH A MEAL   metoprolol succinate (TOPROL-XL) 25 MG 24 hr tablet Take 0.5 tablets (12.5 mg total) by mouth daily.   Multiple Vitamin (MULTIVITAMIN WITH MINERALS) TABS tablet Take 1 tablet by mouth daily.   mupirocin ointment (BACTROBAN) 2 % Apply 1 Application topically daily. To affected area until healed   nystatin ointment (MYCOSTATIN) Apply 1 application topically 3 (three) times daily.   traMADol (ULTRAM) 50 MG tablet Take 100 mg by mouth daily.    valsartan (DIOVAN) 160 MG tablet TAKE 1 TABLET BY MOUTH DAILY   vitamin B-12 (CYANOCOBALAMIN) 1000 MCG tablet Take 1,000 mcg by mouth daily.   No facility-administered encounter medications on file as of 12/22/2022.    Surgical History: Past Surgical History:  Procedure Laterality Date   CARDIAC CATHETERIZATION  1989   CARDIAC CATHETERIZATION  1990   GALLBLADDER SURGERY     LEG SURGERY Right    stent placement    Medical History: Past Medical  History:  Diagnosis Date   Coronary artery disease    a. 1989 s/p MI w/ PTCA to unknown vessel. Reports cath 6 months later with occluded vessel and collaterals--> medically managed since..   Diabetes mellitus without complication (HCC)    Hyperlipidemia LDL goal <70    Hypertension    a. 02/2020 nl renal artery duplex.   MI (myocardial infarction) (HCC)    PAD (peripheral artery disease) (HCC)    a. 12/2019 ABIs: R 1.02, L 0.56-->unchanged.   Sleep apnea     Family History: Family History  Problem Relation Age of Onset   Diabetes Mother    Brain cancer Mother    Throat  cancer Father    Prostate cancer Brother     Social History   Socioeconomic History   Marital status: Married    Spouse name: Not on file   Number of children: Not on file   Years of education: Not on file   Highest education level: Not on file  Occupational History   Not on file  Tobacco Use   Smoking status: Former    Current packs/day: 3.00    Average packs/day: 3.0 packs/day for 32.0 years (96.0 ttl pk-yrs)    Types: Cigarettes   Smokeless tobacco: Never  Vaping Use   Vaping status: Never Used  Substance and Sexual Activity   Alcohol use: No   Drug use: No   Sexual activity: Not on file  Other Topics Concern   Not on file  Social History Narrative   Not on file   Social Determinants of Health   Financial Resource Strain: Not on file  Food Insecurity: Not on file  Transportation Needs: Not on file  Physical Activity: Not on file  Stress: Not on file  Social Connections: Not on file  Intimate Partner Violence: Not on file      Review of Systems  Vital Signs: BP 129/70   Pulse 72   Temp 98.4 F (36.9 C)   Resp 16   Ht 5\' 8"  (1.727 m)   Wt 216 lb (98 kg)   SpO2 95%   BMI 32.84 kg/m    Physical Exam     Assessment/Plan:   General Counseling: Avrey verbalizes understanding of the findings of todays visit and agrees with plan of treatment. I have discussed any further diagnostic evaluation that may be needed or ordered today. We also reviewed his medications today. he has been encouraged to call the office with any questions or concerns that should arise related to todays visit.    No orders of the defined types were placed in this encounter.   No orders of the defined types were placed in this encounter.   No follow-ups on file.   Total time spent:*** Minutes Time spent includes review of chart, medications, test results, and follow up plan with the patient.   [place] Controlled Substance Database was reviewed by me.  This patient was seen  by Sallyanne Kuster, FNP-C in collaboration with Dr. Beverely Risen as a part of collaborative care agreement.   Chesnee Floren R. Tedd Sias, MSN, FNP-C Internal medicine

## 2022-12-23 DIAGNOSIS — M9903 Segmental and somatic dysfunction of lumbar region: Secondary | ICD-10-CM | POA: Diagnosis not present

## 2022-12-23 DIAGNOSIS — M5417 Radiculopathy, lumbosacral region: Secondary | ICD-10-CM | POA: Diagnosis not present

## 2022-12-23 DIAGNOSIS — M5416 Radiculopathy, lumbar region: Secondary | ICD-10-CM | POA: Diagnosis not present

## 2022-12-23 DIAGNOSIS — M4306 Spondylolysis, lumbar region: Secondary | ICD-10-CM | POA: Diagnosis not present

## 2022-12-26 NOTE — Progress Notes (Unsigned)
Cardiology Office Note Date:  12/27/2022  Patient ID:  Allen [name], Allen [name] July 24, 1940, MRN [id number] PCP:  Carlean Jews, PA-C  Cardiologist:  Lorine Bears, MD Electrophysiologist: Lanier Prude, MD    Chief Complaint: ILR, syncope routine follow-up  History of Present Illness: Allen [name] is a 82 y.o. male with PMH notable for recurrent syncope, s/p ILR, PVCs, CAD, HTN, OSA on CPAP, T2DM; seen today for Lanier Prude, MD for routine electrophysiology followup.  He last saw Dr. Lalla Brothers 05/2022 for ILR implant to further eval recurrent syncope.  Device clinic was notified 10/11 of a daytime pause at approx 2pm, Dr. Lalla Brothers reviewed and thought it was vagally mediated.   On follow-up today, he feels well without complaints. No further syncopal episodes. He denies chest pain, chest pressure, palpitations, SOB, dizziness, or LH.   His wife joins for appointment, who also has an ILR for cryptogenic stroke.   Device Information: Bos Sci ILR, imp 05/2022; dx syncope  AAD History: None   Past Medical History:  Diagnosis Date   Coronary artery disease    a. 1989 s/p MI w/ PTCA to unknown vessel. Reports cath 6 months later with occluded vessel and collaterals--> medically managed since..   Diabetes mellitus without complication (HCC)    Hyperlipidemia LDL goal <70    Hypertension    a. 02/2020 nl renal artery duplex.   MI (myocardial infarction) (HCC)    PAD (peripheral artery disease) (HCC)    a. 12/2019 ABIs: R 1.02, L 0.56-->unchanged.   Sleep apnea     Past Surgical History:  Procedure Laterality Date   CARDIAC CATHETERIZATION  1989   CARDIAC CATHETERIZATION  1990   GALLBLADDER SURGERY     LEG SURGERY Right    stent placement    Current Outpatient Medications  Medication Instructions   aspirin 81 MG chewable tablet Oral, Daily   b complex vitamins tablet 1 tablet, Oral, Daily   B-D ULTRA-FINE 33 LANCETS MISC Use 1 Lancet 3 (three) times daily.   BD PEN  NEEDLE NANO 2ND GEN 32G X 4 MM MISC USE AS DIRECTED FOUR TIMES DAILY WITH LEVEMIR FLEXTOUCH AND HUMALOG PEN   cetirizine (ZYRTEC) 10 mg, Oral, Daily   Cholecalciferol (VITAMIN D3) 1000 units CAPS Oral   Continuous Blood Gluc Sensor (FREESTYLE LIBRE 14 DAY SENSOR) MISC Use 1 kit every 14 (fourteen) days   cyanocobalamin (VITAMIN B12) 1,000 mcg, Oral, Daily   empagliflozin (JARDIANCE) 25 mg, Oral, Daily before breakfast   ezetimibe-simvastatin (VYTORIN) 10-40 MG tablet 1 tablet, Oral, Daily   folic acid (FOLVITE) 400 mcg, Oral, Daily   Garlic 1000 MG CAPS Oral   glucose blood test strip Check blood sugar 5x a day as directed   insulin aspart (NOVOLOG) 100 UNIT/ML FlexPen INJECT 10 UNITS UNDER SKIN TWICE DAILY BEFORE MORNING AND EVENING MEAL FOR DIABETES.DISCARD PEN 28 DAYS AFTER OPENING PLUS SLIDING SCALE   Insulin Glargine (BASAGLAR KWIKPEN) 100 UNIT/ML ADMINISTER 26 TO 40 UNITS UNDER THE SKIN EVERY DAY   Insulin Syringe-Needle U-100 (INSULIN SYRINGE 1CC/31GX5/16") 31G X 5/16" 1 ML MISC Use as directed with insulin   Insulin Syringes, Disposable, U-100 1 ML MISC Does not apply   levothyroxine (SYNTHROID) 75 mcg, Oral, Daily before breakfast   meloxicam (MOBIC) 15 mg, Oral, Daily   metFORMIN (GLUCOPHAGE) 500 MG tablet TAKE 1 TABLET(500 MG) BY MOUTH TWICE DAILY WITH A MEAL   metoprolol succinate (TOPROL-XL) 12.5 mg, Oral, Daily   Multiple Vitamin (MULTIVITAMIN WITH MINERALS)  TABS tablet 1 tablet, Oral, Daily   mupirocin ointment (BACTROBAN) 2 % 1 Application, Topical, Daily, To affected area until healed   nystatin ointment (MYCOSTATIN) 1 application , Topical, 3 times daily   traMADol (ULTRAM) 100 mg, Oral, Daily   valsartan (DIOVAN) 160 mg, Oral, Daily    Social History:  The patient  reports that he has quit smoking. His smoking use included cigarettes. He has a 96 pack-year smoking history. He has never used smokeless tobacco. He reports that he does not drink alcohol and does not use  drugs.   Family History:   The patient's family history includes Brain cancer in his mother; Diabetes in his mother; Prostate cancer in his brother; Throat cancer in his father.  ROS:  Please see the history of present illness. All other systems are reviewed and otherwise negative.   PHYSICAL EXAM:  VS:  BP (!) 138/50 (BP Location: Left Arm, Patient Position: Sitting, Cuff Size: Normal)   Pulse 67   Ht 5\' 8"  (1.727 m)   Wt 215 lb 6 oz (97.7 kg)   SpO2 97%   BMI 32.75 kg/m  BMI: Body mass index is 32.75 kg/m.  GEN- The patient is well appearing, alert and oriented x 3 today.   Lungs- Clear to ausculation bilaterally, normal work of breathing.  Heart- Regular rate and rhythm with ectopy, no murmurs, rubs or gallops Extremities- Trace peripheral edema, warm, dry Skin-  ILR insertion site well-healed  Device interrogation done today and reviewed by myself:  Battery good One CHB event, 3 seconds on 10/10 at 2pm 25% PVC burden   EKG is ordered. Personal review of EKG from today shows:    EKG Interpretation Date/Time:  Monday December 27 2022 14:23:00 EST Ventricular Rate:  67 PR Interval:  210 QRS Duration:  92 QT Interval:  368 QTC Calculation: 388 R Axis:   18  Text Interpretation: Sinus rhythm with 1st degree A-V block with occasional Premature ventricular complexes Confirmed by Sherie Don [phone]) on 12/27/2022 3:31:05 PM    Recent Labs: 01/23/2022: ALT 19; BUN 37; Creatinine, Ser 1.41; Potassium 3.6; Sodium 140 12/16/2022: Hemoglobin 14.2; Platelets 228; TSH 2.320  No results found for requested labs within last 365 days.   CrCl cannot be calculated (Patient's most recent lab result is older than the maximum 21 days allowed.).   Wt Readings from Last 3 Encounters:  12/27/22 215 lb 6 oz (97.7 kg)  12/22/22 216 lb (98 kg)  12/16/22 217 lb (98.4 kg)     Additional studies reviewed include: Previous EP, cardiology notes.   Long term monitor, 03/09/2022 Patient had  a min HR of 30 bpm, max HR of 125 bpm, and avg HR of 55 bpm. Predominant underlying rhythm was Sinus Rhythm. First Degree AV Block was present. 1 run of Supraventricular Tachycardia occurred lasting 34.3 secs with a max rate of 125 bpm (avg  112 bpm). Episode of Supraventricular Tachycardia may be possible Atrial Tachycardia with variable block. Second Degree AV Block-Mobitz I (Wenckebach) was present.  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 The study is low risk.   No ST deviation was noted.   LV perfusion is abnormal. There is no evidence of ischemia. There is evidence of infarction. Defect 1: There is a small defect with severe reduction in uptake present in the basal inferior location(s) that  is fixed. There is abnormal wall motion in the defect area. Consistent with infarction.   Left ventricular function is normal. End diastolic cavity size is normal. End systolic cavity size is normal.   There is evidence of prior small infarct in the basal inferior wall without ischemia.   CT attenuation images showed moderate aortic and coronary artery calcifications.  TTE, 09/02/2020  1. Left ventricular ejection fraction, by estimation, is 55 to 60%. The left ventricle has normal function. The left ventricle has no regional wall motion abnormalities. There is mild left ventricular hypertrophy. Left ventricular diastolic parameters are consistent with Grade I diastolic dysfunction (impaired relaxation).   2. Right ventricular systolic function is normal. The right ventricular size is normal.   3. The mitral valve is normal in structure. No evidence of mitral valve regurgitation.   4. The aortic valve was not well visualized. Aortic valve regurgitation is not visualized. Mild to moderate aortic valve sclerosis/calcification is present, without any evidence of aortic  stenosis.   5. The inferior vena cava is normal in size with greater than 50% respiratory variability, suggesting right atrial pressure of 3 mmHg.       ASSESSMENT AND PLAN:  #) Syncope #) ILR in situ No further syncopal episodes Continue to monitor via remotes  #) PVC 25% PVC burden via ILR Patient asymptomatic Continue 12.5mg  toprol daily Update echo to confirm preserved LVEF      Current medicines are reviewed at length with the patient today.   The patient does not have concerns regarding his medicines.  The following changes were made today:  none  Labs/ tests ordered today include:  Orders Placed This Encounter  Procedures   EKG 12-Lead   ECHOCARDIOGRAM COMPLETE     Disposition: Follow up with Dr. Lalla Brothers or EP APP  1 week after echo    Signed, Sherie Don, NP  12/27/22  3:31 PM  Electrophysiology [facility]

## 2022-12-27 ENCOUNTER — Encounter: Payer: Self-pay | Admitting: Cardiology

## 2022-12-27 ENCOUNTER — Ambulatory Visit: Payer: Medicare Other | Attending: Cardiology | Admitting: Cardiology

## 2022-12-27 VITALS — BP 138/50 | HR 67 | Ht 68.0 in | Wt 215.4 lb

## 2022-12-27 DIAGNOSIS — I10 Essential (primary) hypertension: Secondary | ICD-10-CM | POA: Insufficient documentation

## 2022-12-27 DIAGNOSIS — R55 Syncope and collapse: Secondary | ICD-10-CM | POA: Diagnosis not present

## 2022-12-27 DIAGNOSIS — I493 Ventricular premature depolarization: Secondary | ICD-10-CM | POA: Diagnosis not present

## 2022-12-27 DIAGNOSIS — M5417 Radiculopathy, lumbosacral region: Secondary | ICD-10-CM | POA: Diagnosis not present

## 2022-12-27 DIAGNOSIS — M4306 Spondylolysis, lumbar region: Secondary | ICD-10-CM | POA: Diagnosis not present

## 2022-12-27 DIAGNOSIS — M5416 Radiculopathy, lumbar region: Secondary | ICD-10-CM | POA: Diagnosis not present

## 2022-12-27 DIAGNOSIS — M9903 Segmental and somatic dysfunction of lumbar region: Secondary | ICD-10-CM | POA: Diagnosis not present

## 2022-12-27 NOTE — Patient Instructions (Signed)
Medication Instructions:  The current medical regimen is effective;  continue present plan and medications.  *If you need a refill on your cardiac medications before your next appointment, please call your pharmacy*   Testing/Procedures: Your physician has requested that you have an echocardiogram. Echocardiography is a painless test that uses sound waves to create images of your heart. It provides your doctor with information about the size and shape of your heart and how well your heart's chambers and valves are working.   You may receive an ultrasound enhancing agent through an IV if needed to better visualize your heart during the echo. This procedure takes approximately one hour.  There are no restrictions for this procedure.  This will take place at [address] [facility]) [address], [place] [address]  Please note: We ask at that you not bring children with you during ultrasound (echo/ vascular) testing. Due to room size and safety concerns, children are not allowed in the ultrasound rooms during exams. Our front office staff cannot provide observation of children in our lobby area while testing is being conducted. An adult accompanying a patient to their appointment will only be allowed in the ultrasound room at the discretion of the ultrasound technician under special circumstances. We apologize for any inconvenience.    Follow-Up: At [facility], you and your health needs are our priority.  As part of our continuing mission to provide you with exceptional heart care, we have created designated Provider Care Teams.  These Care Teams include your primary Cardiologist (physician) and Advanced Practice Providers (APPs -  Physician Assistants and Nurse Practitioners) who all work together to provide you with the care you need, when you need it.  We recommend signing up for the patient portal called "MyChart".  Sign up information is provided on this After Visit  Summary.  MyChart is used to connect with patients for Virtual Visits (Telemedicine).  Patients are able to view lab/test results, encounter notes, upcoming appointments, etc.  Non-urgent messages can be sent to your provider as well.   To learn more about what you can do with MyChart, go to [email or web address].    Your next appointment:   1 week post ECHO with Dr.Lambert OR Sherie Don, NP

## 2022-12-29 DIAGNOSIS — Z79899 Other long term (current) drug therapy: Secondary | ICD-10-CM | POA: Diagnosis not present

## 2022-12-29 DIAGNOSIS — M48062 Spinal stenosis, lumbar region with neurogenic claudication: Secondary | ICD-10-CM | POA: Diagnosis not present

## 2022-12-29 DIAGNOSIS — M5416 Radiculopathy, lumbar region: Secondary | ICD-10-CM | POA: Diagnosis not present

## 2022-12-30 ENCOUNTER — Ambulatory Visit: Payer: Medicare Other | Attending: Cardiology

## 2022-12-30 ENCOUNTER — Encounter: Payer: Self-pay | Admitting: Podiatry

## 2022-12-30 ENCOUNTER — Ambulatory Visit (INDEPENDENT_AMBULATORY_CARE_PROVIDER_SITE_OTHER): Payer: Medicare Other | Admitting: Podiatry

## 2022-12-30 VITALS — Ht 68.0 in | Wt 215.4 lb

## 2022-12-30 DIAGNOSIS — E119 Type 2 diabetes mellitus without complications: Secondary | ICD-10-CM

## 2022-12-30 DIAGNOSIS — Z794 Long term (current) use of insulin: Secondary | ICD-10-CM | POA: Diagnosis not present

## 2022-12-30 DIAGNOSIS — R55 Syncope and collapse: Secondary | ICD-10-CM | POA: Diagnosis not present

## 2022-12-30 DIAGNOSIS — I8390 Asymptomatic varicose veins of unspecified lower extremity: Secondary | ICD-10-CM | POA: Diagnosis not present

## 2022-12-30 DIAGNOSIS — I493 Ventricular premature depolarization: Secondary | ICD-10-CM | POA: Insufficient documentation

## 2022-12-30 DIAGNOSIS — M79676 Pain in unspecified toe(s): Secondary | ICD-10-CM | POA: Diagnosis not present

## 2022-12-30 DIAGNOSIS — B351 Tinea unguium: Secondary | ICD-10-CM | POA: Diagnosis not present

## 2022-12-30 DIAGNOSIS — E1159 Type 2 diabetes mellitus with other circulatory complications: Secondary | ICD-10-CM

## 2022-12-30 DIAGNOSIS — Z0189 Encounter for other specified special examinations: Secondary | ICD-10-CM

## 2022-12-31 DIAGNOSIS — M5417 Radiculopathy, lumbosacral region: Secondary | ICD-10-CM | POA: Diagnosis not present

## 2022-12-31 DIAGNOSIS — M4306 Spondylolysis, lumbar region: Secondary | ICD-10-CM | POA: Diagnosis not present

## 2022-12-31 DIAGNOSIS — M5416 Radiculopathy, lumbar region: Secondary | ICD-10-CM | POA: Diagnosis not present

## 2022-12-31 DIAGNOSIS — M9903 Segmental and somatic dysfunction of lumbar region: Secondary | ICD-10-CM | POA: Diagnosis not present

## 2022-12-31 LAB — ECHOCARDIOGRAM COMPLETE
Area-P 1/2: 3.27 cm2
S' Lateral: 3.7 cm

## 2023-01-03 DIAGNOSIS — M5416 Radiculopathy, lumbar region: Secondary | ICD-10-CM | POA: Diagnosis not present

## 2023-01-03 DIAGNOSIS — M4306 Spondylolysis, lumbar region: Secondary | ICD-10-CM | POA: Diagnosis not present

## 2023-01-03 DIAGNOSIS — M5417 Radiculopathy, lumbosacral region: Secondary | ICD-10-CM | POA: Diagnosis not present

## 2023-01-03 DIAGNOSIS — M9903 Segmental and somatic dysfunction of lumbar region: Secondary | ICD-10-CM | POA: Diagnosis not present

## 2023-01-03 NOTE — Progress Notes (Unsigned)
ANNUAL DIABETIC FOOT EXAM  Subjective: Allen [name] presents today for annual diabetic foot exam.  Chief Complaint  Patient presents with   Nail Problem    Pt is here for [facility], last A1C was 8.5 and PCP is Dr Lewis Moccasin and LOV was last week.   Patient confirms h/o diabetes.  Patient denies any h/o foot wounds.  Patient has been diagnosed with neuropathy.  McDonough, Salomon Fick, PA-C is patient's PCP.  Past Medical History:  Diagnosis Date   Coronary artery disease    a. 1989 s/p MI w/ PTCA to unknown vessel. Reports cath 6 months later with occluded vessel and collaterals--> medically managed since..   Diabetes mellitus without complication (HCC)    Hyperlipidemia LDL goal <70    Hypertension    a. 02/2020 nl renal artery duplex.   MI (myocardial infarction) (HCC)    PAD (peripheral artery disease) (HCC)    a. 12/2019 ABIs: R 1.02, L 0.56-->unchanged.   Sleep apnea    Patient Active Problem List   Diagnosis Date Noted   Diabetic peripheral neuropathy (HCC) 08/11/2022   Exposure to potentially hazardous substance 08/11/2022   Obstructive sleep apnea syndrome 08/11/2022   Regular astigmatism 08/11/2022   Atherosclerosis of native arteries of extremity with intermittent claudication (HCC) 01/03/2022   Need for shingles vaccine 01/22/2019   Need for vaccination against Streptococcus pneumoniae using pneumococcal conjugate vaccine 7 01/22/2019   Encounter for general adult medical examination with abnormal findings 01/18/2018   Abnormal liver function 01/18/2018   S/P laparoscopic cholecystectomy 01/18/2018   Bradycardia 01/18/2018   Cutaneous candidiasis 01/18/2018   Oral candidiasis 01/18/2018   Septic shock (HCC) 01/08/2018   Ascending cholangitis 01/08/2018   CAD (coronary artery disease) 01/08/2018   Sepsis (HCC) 01/08/2018   Insect bite of right lower leg 09/25/2017   Atopic dermatitis 09/25/2017   PAD (peripheral artery disease) (HCC) 11/24/2015   Type 2 diabetes  mellitus with hyperglycemia, without long-term current use of insulin (HCC) 11/24/2015   Hyperlipidemia 11/24/2015   Essential hypertension 11/24/2015   PVD (peripheral vascular disease) (HCC) 10/29/2015   DDD (degenerative disc disease), lumbar 01/30/2015   Lumbar radiculitis 01/30/2015   Osteoarthritis of spine with radiculopathy, 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   Difficulty walking 08/22/2014   History of coronary artery disease 06/14/2014   Ataxia 09/10/2013   Back pain 09/10/2013   Morbid obesity due to excess calories (HCC) 09/10/2013   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   Deficiency of other specified B group vitamins 08/28/2013   Hemorrhage of rectum and anus 05/24/2013   Past Surgical History:  Procedure Laterality Date   CARDIAC CATHETERIZATION  1989   CARDIAC CATHETERIZATION  1990   GALLBLADDER SURGERY     LEG SURGERY Right    stent placement   Current Outpatient Medications on File Prior to Visit  Medication Sig Dispense Refill   aspirin 81 MG chewable tablet Chew by mouth daily.     b complex vitamins tablet Take 1 tablet by mouth daily.     B-D ULTRA-FINE 33 LANCETS MISC Use 1 Lancet 3 (three) times daily.     BD PEN NEEDLE NANO 2ND GEN 32G X 4 MM MISC USE AS DIRECTED FOUR TIMES DAILY WITH LEVEMIR FLEXTOUCH AND HUMALOG PEN 100 each 3   cetirizine (ZYRTEC) 10 MG tablet Take 10 mg by mouth daily.     Cholecalciferol (VITAMIN D3) 1000 units CAPS Take by mouth.     Continuous Blood Gluc Sensor (FREESTYLE LIBRE 14 DAY SENSOR) MISC Use  1 kit every 14 (fourteen) days 2 each 11   empagliflozin (JARDIANCE) 25 MG TABS tablet Take 1 tablet (25 mg total) by mouth daily before breakfast. 90 tablet 3   ezetimibe-simvastatin (VYTORIN) 10-40 MG tablet TAKE 1 TABLET BY MOUTH DAILY 90 tablet 1   folic acid (FOLVITE) 400 MCG tablet Take 400 mcg by mouth daily.     Garlic 1000 MG CAPS Take by mouth.     glucose blood test strip Check blood sugar 5x a day as directed     insulin aspart  (NOVOLOG) 100 UNIT/ML FlexPen INJECT 10 UNITS UNDER SKIN TWICE DAILY BEFORE MORNING AND EVENING MEAL FOR DIABETES.DISCARD PEN 28 DAYS AFTER OPENING PLUS SLIDING SCALE     Insulin Glargine (BASAGLAR KWIKPEN) 100 UNIT/ML ADMINISTER 26 TO 40 UNITS UNDER THE SKIN EVERY DAY 15 mL 3   Insulin Syringe-Needle U-100 (INSULIN SYRINGE 1CC/31GX5/16") 31G X 5/16" 1 ML MISC Use as directed with insulin 100 each 3   Insulin Syringes, Disposable, U-100 1 ML MISC by Does not apply route.     levothyroxine (SYNTHROID) 75 MCG tablet TAKE 1 TABLET BY MOUTH DAILY BEFORE BREAKFAST 90 tablet 3   meloxicam (MOBIC) 15 MG tablet Take 15 mg by mouth daily.     metFORMIN (GLUCOPHAGE) 500 MG tablet TAKE 1 TABLET(500 MG) BY MOUTH TWICE DAILY WITH A MEAL 180 tablet 1   metoprolol succinate (TOPROL-XL) 25 MG 24 hr tablet Take 0.5 tablets (12.5 mg total) by mouth daily. 90 tablet 1   Multiple Vitamin (MULTIVITAMIN WITH MINERALS) TABS tablet Take 1 tablet by mouth daily.     mupirocin ointment (BACTROBAN) 2 % Apply 1 Application topically daily. To affected area until healed 30 g 2   nystatin ointment (MYCOSTATIN) Apply 1 application topically 3 (three) times daily. 30 g 2   traMADol (ULTRAM) 50 MG tablet Take 100 mg by mouth daily.      valsartan (DIOVAN) 160 MG tablet TAKE 1 TABLET BY MOUTH DAILY 90 tablet 3   vitamin B-12 (CYANOCOBALAMIN) 1000 MCG tablet Take 1,000 mcg by mouth daily.     No current facility-administered medications on file prior to visit.    Allergies  Allergen Reactions   Iodinated Contrast Media Anaphylaxis and Rash    TOLERATED CT CONTRAST ON 01/07/2018 WITH NO ISSUES AFTER 10 MINUTE PRETREATMENT X-Ray contrast dyes- anaphylaxis & rash X-Ray contrast dyes- anaphylaxis & rash  X-Ray contrast dyes- anaphylaxis & rash TOLERATED CT CONTRAST ON 01/07/2018 WITH NO ISSUES AFTER 10 MINUTE PRETREATMENT X-Ray contrast dyes- anaphylaxis & rash X-Ray contrast dyes- anaphylaxis & rash   Iohexol Hives and  Shortness Of Breath    Pt states that he had IV dye for a kidney x-ray years ago and after injection of the dye he had hives all over his body, throat swelling, and SOB. Pt states that he had IV dye for a kidney x-ray years ago and after injection of the dye he had hives all over his body, throat swelling, and SOB. Pt states that he had IV dye for a kidney x-ray years ago and after injection of the dye he had hives all over his body, throat swelling, and SOB.   Liraglutide Nausea And Vomiting   Duloxetine Other (See Comments)    Sick to stomach and chills   Gabapentin Other (See Comments)   Penicillins Other (See Comments)   Other Rash   Social History   Occupational History   Not on file  Tobacco Use   Smoking  status: Former    Current packs/day: 3.00    Average packs/day: 3.0 packs/day for 32.0 years (96.0 ttl pk-yrs)    Types: Cigarettes   Smokeless tobacco: Never  Vaping Use   Vaping status: Never Used  Substance and Sexual Activity   Alcohol use: No   Drug use: No   Sexual activity: Not on file   Family History  Problem Relation Age of Onset   Diabetes Mother    Brain cancer Mother    Throat cancer Father    Prostate cancer Brother    Immunization History  Administered Date(s) Administered   Fluad Quad(high Dose 65+) 11/29/2018   Influenza Inj Mdck Quad Pf 11/15/2021   Influenza, High Dose Seasonal PF 11/30/2016, 11/09/2017, 11/25/2021   Influenza-Unspecified 11/24/2011, 11/23/2012, 12/14/2013, 10/18/2014, 01/02/2015, 12/17/2015, 11/30/2016, 09/15/2017, 10/15/2017, 10/17/2018, 11/21/2019, 11/24/2019, 12/04/2020   PFIZER(Purple Top)SARS-COV-2 Vaccination 03/04/2019, 03/08/2019, 03/25/2019, 03/29/2019, 11/26/2019   Pneumococcal Conjugate-13 12/17/2015   Pneumococcal Polysaccharide-23 12/31/2005, 12/10/2019   Tdap 01/20/2016   Zoster Recombinant(Shingrix) 12/10/2019, 02/27/2020   Zoster, Live 12/29/2006     Review of Systems: Negative except as noted in the HPI.    Objective: There were no vitals filed for this visit.  Allen [name] is a pleasant 82 y.o. male in NAD. AAO X 3. {Perform Simple Foot Exam  Perform Detailed exam:1} Title   Diabetic Foot Exam - detailed Date & Time: 12/30/2022  4:15 PM Diabetic Foot exam was performed with the following findings: Yes  Visual Foot Exam completed.: Yes   Pulse Foot Exam completed.: Yes      Sensory Foot Exam Completed.: Yes Semmes-Weinstein Monofilament Test "+" means "has sensation" and "-" means "no sensation"  R Foot Test Control: Neg L Foot Test Control: Neg   R Site 1-Great Toe: Neg L Site 1-Great Toe: Neg   R Site 4: Neg L Site 4: Neg   R site 5: Neg L Site 5: Neg  R Site 6: Neg L Site 6: Neg     Image components are not supported.   Image components are not supported. Image components are not supported.  [place] Comments     Lab Results  Component Value Date   HGBA1C 8.2 (A) 12/10/2021   No results found. ADA Risk Categorization: Low Risk :  Patient has all of the following: Intact protective sensation No prior foot ulcer  No severe deformity Pedal pulses present  High Risk  Patient has one or more of the following: Loss of protective sensation Absent pedal pulses Severe Foot deformity History of foot ulcer  Assessment: No diagnosis found.   Plan: No orders of the defined types were placed in this encounter.   No orders of the defined types were placed in this encounter.   None  -Patient/POA to call should there be question/concern in the interim. Return in about 3 months (around 04/01/2023).  Freddie Breech, DPM      [place] LOCATION: [address], [place] [address]                   Office [phone]   [facility] LOCATION: [address] [place], [place] [address] Office [phone]

## 2023-01-05 DIAGNOSIS — M9903 Segmental and somatic dysfunction of lumbar region: Secondary | ICD-10-CM | POA: Diagnosis not present

## 2023-01-05 DIAGNOSIS — M5416 Radiculopathy, lumbar region: Secondary | ICD-10-CM | POA: Diagnosis not present

## 2023-01-05 DIAGNOSIS — M4306 Spondylolysis, lumbar region: Secondary | ICD-10-CM | POA: Diagnosis not present

## 2023-01-05 DIAGNOSIS — M5417 Radiculopathy, lumbosacral region: Secondary | ICD-10-CM | POA: Diagnosis not present

## 2023-01-05 NOTE — Progress Notes (Signed)
MRN : [id number]  Allen [name] is a 82 y.o. (18-Jan-1941) male who presents with chief complaint of check circulation.  History of Present Illness:   The patient returns to the office for followup and review of the noninvasive studies. There have been no interval changes in lower extremity symptoms. No interval shortening of the patient's claudication distance or development of rest pain symptoms. No new ulcers or wounds have occurred since the last visit.   There have been no significant changes to the patient's overall health care.   The patient denies amaurosis fugax or recent TIA symptoms. There are no recent neurological changes noted. The patient denies history of DVT, PE or superficial thrombophlebitis. The patient denies recent episodes of angina or shortness of breath.    ABI Rt=1.01 and Lt=0.56  (previous ABI's Rt=1.01 and Lt=0.55)  No outpatient medications have been marked as taking for the 01/06/23 encounter (Appointment) with Gilda Crease, Latina Craver, MD.    Past Medical History:  Diagnosis Date   Coronary artery disease    a. 1989 s/p MI w/ PTCA to unknown vessel. Reports cath 6 months later with occluded vessel and collaterals--> medically managed since..   Diabetes mellitus without complication (HCC)    Hyperlipidemia LDL goal <70    Hypertension    a. 02/2020 nl renal artery duplex.   MI (myocardial infarction) (HCC)    PAD (peripheral artery disease) (HCC)    a. 12/2019 ABIs: R 1.02, L 0.56-->unchanged.   Sleep apnea     Past Surgical History:  Procedure Laterality Date   CARDIAC CATHETERIZATION  1989   CARDIAC CATHETERIZATION  1990   GALLBLADDER SURGERY     LEG SURGERY Right    stent placement    Social History Social History   Tobacco Use   Smoking status: Former    Current packs/day: 3.00    Average packs/day: 3.0 packs/day for 32.0 years (96.0 ttl pk-yrs)    Types: Cigarettes    Smokeless tobacco: Never  Vaping Use   Vaping status: Never Used  Substance Use Topics   Alcohol use: No   Drug use: No    Family History Family History  Problem Relation Age of Onset   Diabetes Mother    Brain cancer Mother    Throat cancer Father    Prostate cancer Brother     Allergies  Allergen Reactions   Iodinated Contrast Media Anaphylaxis and Rash    TOLERATED CT CONTRAST ON 01/07/2018 WITH NO ISSUES AFTER 10 MINUTE PRETREATMENT X-Ray contrast dyes- anaphylaxis & rash X-Ray contrast dyes- anaphylaxis & rash  X-Ray contrast dyes- anaphylaxis & rash TOLERATED CT CONTRAST ON 01/07/2018 WITH NO ISSUES AFTER 10 MINUTE PRETREATMENT X-Ray contrast dyes- anaphylaxis & rash X-Ray contrast dyes- anaphylaxis & rash   Iohexol Hives and Shortness Of Breath    Pt states that he had IV dye for a kidney x-ray years ago and after injection of the dye he had hives all over his body, throat swelling, and SOB. Pt states that he had IV dye for a kidney x-ray years ago and after injection of the  dye he had hives all over his body, throat swelling, and SOB. Pt states that he had IV dye for a kidney x-ray years ago and after injection of the dye he had hives all over his body, throat swelling, and SOB.   Liraglutide Nausea And Vomiting   Duloxetine Other (See Comments)    Sick to stomach and chills   Gabapentin Other (See Comments)   Penicillins Other (See Comments)   Other Rash     REVIEW OF SYSTEMS (Negative unless checked)  Constitutional: [] Weight loss  [] Fever  [] Chills Cardiac: [] Chest pain   [] Chest pressure   [] Palpitations   [] Shortness of breath when laying flat   [] Shortness of breath with exertion. Vascular:  [x] Pain in legs with walking   [] Pain in legs at rest  [] History of DVT   [] Phlebitis   [] Swelling in legs   [] Varicose veins   [] Non-healing ulcers Pulmonary:   [] Uses home oxygen   [] Productive cough   [] Hemoptysis   [] Wheeze  [] COPD   [] Asthma Neurologic:   [] Dizziness   [] Seizures   [] History of stroke   [] History of TIA  [] Aphasia   [] Vissual changes   [] Weakness or numbness in arm   [] Weakness or numbness in leg Musculoskeletal:   [] Joint swelling   [] Joint pain   [] Low back pain Hematologic:  [] Easy bruising  [] Easy bleeding   [] Hypercoagulable state   [] Anemic Gastrointestinal:  [] Diarrhea   [] Vomiting  [] Gastroesophageal reflux/heartburn   [] Difficulty swallowing. Genitourinary:  [] Chronic kidney disease   [] Difficult urination  [] Frequent urination   [] Blood in urine Skin:  [] Rashes   [] Ulcers  Psychological:  [] History of anxiety   []  History of major depression.  Physical Examination  There were no vitals filed for this visit. There is no height or weight on file to calculate BMI. Gen: WD/WN, NAD Head: [place]/AT, No temporalis wasting.  Ear/Nose/Throat: Hearing grossly intact, nares w/o erythema or drainage Eyes: PER, EOMI, sclera nonicteric.  Neck: Supple, no masses.  No bruit or JVD.  Pulmonary:  Good air movement, no audible wheezing, no use of accessory muscles.  Cardiac: RRR, normal S1, S2, no Murmurs. Vascular:  mild trophic changes, no open wounds Vessel Right Left  Radial Palpable Palpable  PT Not Palpable Not Palpable  DP Trace Palpable Not Palpable  Gastrointestinal: soft, non-distended. No guarding/no peritoneal signs.  Musculoskeletal: M/S 5/5 throughout.  No visible deformity.  Neurologic: CN 2-12 intact. Pain and light touch intact in extremities.  Symmetrical.  Speech is fluent. Motor exam as listed above. Psychiatric: Judgment intact, Mood & affect appropriate for pt's clinical situation. Dermatologic: No rashes or ulcers noted.  No changes consistent with cellulitis.   CBC Lab Results  Component Value Date   WBC 4.9 12/16/2022   HGB 14.2 12/16/2022   HCT 43.3 12/16/2022   MCV 103 (H) 12/16/2022   PLT 228 12/16/2022    BMET    Component Value Date/Time   NA 140 01/23/2022 1953   NA 139 12/11/2021 1150    NA 132 (L) 10/04/2011 0756   K 3.6 01/23/2022 1953   K 4.3 10/04/2011 0756   CL 104 01/23/2022 1953   CL 95 (L) 10/04/2011 0756   CO2 28 01/23/2022 1953   CO2 29 10/04/2011 0756   GLUCOSE 108 (H) 01/23/2022 1953   GLUCOSE 182 (H) 10/04/2011 0756   BUN 37 (H) 01/23/2022 1953   BUN 32 (H) 12/11/2021 1150   BUN 23 (H) 10/04/2011 0756   CREATININE 1.41 (H)  01/23/2022 1953   CREATININE 0.97 10/04/2011 0756   CALCIUM 9.4 01/23/2022 1953   CALCIUM 9.5 10/04/2011 0756   GFRNONAA 51 (L) 01/23/2022 1953   GFRAA 59 (L) 02/18/2020 0846   CrCl cannot be calculated (Patient's most recent lab result is older than the maximum 21 days allowed.).  COAG No results found for: "INR", "PROTIME"  Radiology ECHOCARDIOGRAM COMPLETE  Result Date: 12/31/2022    ECHOCARDIOGRAM REPORT   Patient Name:   Allen [name] Date of Exam: 12/30/2022 Medical Rec #:  [id number]  Height:       68.0 in Accession #:    [id number] Weight:       215.4 lb Date of Birth:  September 10, 1940   BSA:          2.109 m Patient Age:    82 years   BP:           138/50 mmHg Patient Gender: M          HR:           68 bpm. Exam Location:  [place] Procedure: 2D Echo, Cardiac Doppler, Color Doppler and Strain Analysis Indications:     I49.3 PVC  History:         Patient has prior history of Echocardiogram examinations, most                  recent 09/02/2020. Arrythmias:Bradycardia; Risk Factors:Former                  Smoker, Hypertension, Diabetes and Dyslipidemia.  Sonographer:     Ilda Mori MHA, BS, RDCS Referring Phys:  [id number] [facility] RIDDLE Diagnosing Phys: Julien Nordmann MD IMPRESSIONS  1. Left ventricular ejection fraction, by estimation, is 55 to 60%. The left ventricle has normal function. The left ventricle has no regional wall motion abnormalities. There is mild left ventricular hypertrophy. Left ventricular diastolic parameters are consistent with Grade I diastolic dysfunction (impaired relaxation). The average left ventricular global  longitudinal strain is -16.1 %.  2. Right ventricular systolic function is normal. The right ventricular size is normal.  3. The mitral valve is normal in structure. Mild mitral valve regurgitation. No evidence of mitral stenosis.  4. The aortic valve has an indeterminant number of cusps. There is moderate calcification of the aortic valve. Aortic valve regurgitation is not visualized. Valve gradient measurements not obtained.  5. The inferior vena cava is normal in size with greater than 50% respiratory variability, suggesting right atrial pressure of 3 mmHg. FINDINGS  Left Ventricle: Left ventricular ejection fraction, by estimation, is 55 to 60%. The left ventricle has normal function. The left ventricle has no regional wall motion abnormalities. The average left ventricular global longitudinal strain is -16.1 %. The left ventricular internal cavity size was normal in size. There is mild left ventricular hypertrophy. Left ventricular diastolic parameters are consistent with Grade I diastolic dysfunction (impaired relaxation). Right Ventricle: The right ventricular size is normal. No increase in right ventricular wall thickness. Right ventricular systolic function is normal. Left Atrium: Left atrial size was normal in size. Right Atrium: Right atrial size was normal in size. Pericardium: There is no evidence of pericardial effusion. Mitral Valve: The mitral valve is normal in structure. Mild mitral valve regurgitation. No evidence of mitral valve stenosis. Tricuspid Valve: The tricuspid valve is normal in structure. Tricuspid valve regurgitation is not demonstrated. No evidence of tricuspid stenosis. Aortic Valve: The aortic valve has an indeterminant number of cusps. There is moderate calcification of  the aortic valve. Aortic valve regurgitation is not visualized. Pulmonic Valve: The pulmonic valve was normal in structure. Pulmonic valve regurgitation is not visualized. No evidence of pulmonic stenosis. Aorta:  The aortic root is normal in size and structure. Venous: The inferior vena cava is normal in size with greater than 50% respiratory variability, suggesting right atrial pressure of 3 mmHg. IAS/Shunts: No atrial level shunt detected by color flow Doppler.  LEFT VENTRICLE PLAX 2D LVIDd:         5.00 cm   Diastology LVIDs:         3.70 cm   LV e' medial:    5.87 cm/s LV PW:         0.90 cm   LV E/e' medial:  11.3 LV IVS:        1.10 cm   LV e' lateral:   7.29 cm/s LVOT diam:     2.40 cm   LV E/e' lateral: 9.1 LVOT Area:     4.52 cm                          2D Longitudinal Strain                          2D Strain GLS Avg:     -16.1 % RIGHT VENTRICLE RV S prime:     16.40 cm/s TAPSE (M-mode): 2.3 cm LEFT ATRIUM           Index        RIGHT ATRIUM           Index LA diam:      3.70 cm 1.75 cm/m   RA Area:     14.40 cm LA Vol (A2C): 27.8 ml 13.18 ml/m  RA Volume:   27.60 ml  13.09 ml/m LA Vol (A4C): 48.4 ml 22.95 ml/m   AORTA Ao Root diam: 3.40 cm MITRAL VALVE MV Area (PHT): 3.27 cm    SHUNTS MV Decel Time: 232 msec    Systemic Diam: 2.40 cm MV E velocity: 66.50 cm/s MV A velocity: 99.00 cm/s MV E/A ratio:  0.67 Julien Nordmann MD Electronically signed by Julien Nordmann MD Signature Date/Time: 12/31/2022/2:50:01 PM    Final (Updated)      Assessment/Plan 1. Atherosclerosis of native artery of both lower extremities with intermittent claudication (HCC)  Recommend:  The patient has evidence of atherosclerosis of the lower extremities with claudication.  The patient does not voice lifestyle limiting changes at this point in time.  Noninvasive studies do not suggest clinically significant change.  No invasive studies, angiography or surgery at this time The patient should continue walking and begin a more formal exercise program.  The patient should continue antiplatelet therapy and aggressive treatment of the lipid abnormalities  No changes in the patient's medications at this time  Continued  surveillance is indicated as atherosclerosis is likely to progress with time.    The patient will continue follow up with noninvasive studies as ordered.  - VAS [place] ABI WITH/WO TBI; Future  2. Coronary artery disease involving native coronary artery of native heart with angina pectoris (HCC) Continue cardiac and antihypertensive medications as already ordered and reviewed, no changes at this time.  Continue statin as ordered and reviewed, no changes at this time  Nitrates PRN for chest pain  3. Essential hypertension Continue antihypertensive medications as already ordered, these medications have been reviewed and there are no  changes at this time.  4. Type 2 diabetes mellitus with hyperglycemia, without long-term current use of insulin (HCC) Continue hypoglycemic medications as already ordered, these medications have been reviewed and there are no changes at this time.  Hgb A1C to be monitored as already arranged by primary service  5. Hyperlipidemia, unspecified hyperlipidemia type Continue statin as ordered and reviewed, no changes at this time    Levora Dredge, MD  01/05/2023 7:48 AM

## 2023-01-06 ENCOUNTER — Ambulatory Visit (INDEPENDENT_AMBULATORY_CARE_PROVIDER_SITE_OTHER): Payer: Medicare Other | Admitting: Vascular Surgery

## 2023-01-06 ENCOUNTER — Ambulatory Visit (INDEPENDENT_AMBULATORY_CARE_PROVIDER_SITE_OTHER): Payer: Medicare Other

## 2023-01-06 ENCOUNTER — Encounter (INDEPENDENT_AMBULATORY_CARE_PROVIDER_SITE_OTHER): Payer: Self-pay | Admitting: Vascular Surgery

## 2023-01-06 VITALS — BP 149/85 | HR 69 | Resp 18 | Ht 68.0 in | Wt 215.6 lb

## 2023-01-06 DIAGNOSIS — E785 Hyperlipidemia, unspecified: Secondary | ICD-10-CM | POA: Diagnosis not present

## 2023-01-06 DIAGNOSIS — I70213 Atherosclerosis of native arteries of extremities with intermittent claudication, bilateral legs: Secondary | ICD-10-CM

## 2023-01-06 DIAGNOSIS — I25119 Atherosclerotic heart disease of native coronary artery with unspecified angina pectoris: Secondary | ICD-10-CM

## 2023-01-06 DIAGNOSIS — E1165 Type 2 diabetes mellitus with hyperglycemia: Secondary | ICD-10-CM | POA: Diagnosis not present

## 2023-01-06 DIAGNOSIS — I10 Essential (primary) hypertension: Secondary | ICD-10-CM

## 2023-01-06 LAB — VAS US ABI WITH/WO TBI
Left ABI: 0.56
Right ABI: 1.01

## 2023-01-09 NOTE — Progress Notes (Unsigned)
Cardiology Office Note Date:  01/10/2023  Patient ID:  Allen [name], Allen [name] 1940/04/11, MRN [id number] PCP:  Carlean Jews, PA-C  Cardiologist:  Lorine Bears, MD Electrophysiologist: Lanier Prude, MD    Chief Complaint: ILR, syncope routine follow-up  History of Present Illness: Allen [name] is a 82 y.o. male with PMH notable for recurrent syncope, s/p ILR, PVCs, CAD, HTN, OSA on CPAP, T2DM; seen today for Lanier Prude, MD for routine electrophysiology followup.  He last saw Dr. Lalla Brothers 05/2022 for ILR implant to further eval recurrent syncope.  Device clinic was notified 10/11 of a daytime pause at approx 2pm, Dr. Lalla Brothers reviewed and thought it was vagally mediated.  I saw him earlier this month for routine follow-up, frequent PVCs noted on EKG and via ILR. He was asymptomatic, recommended updated TTE to eval, which shows preserved LVEF.   On follow-up today, he feels well without cardiac complaints. His main complaint is ongoing back pain. No further syncopal episodes. He denies chest pain, chest pressure, palpitations, SOB, dizziness, or LH.   His wife joins for appointment, who also has an ILR for cryptogenic stroke.   Device Information: Bos Sci ILR, imp 05/2022; dx syncope  AAD History: None    ROS:  Please see the history of present illness. All other systems are reviewed and otherwise negative.   PHYSICAL EXAM:  VS:  BP (!) 132/58 (BP Location: Left Arm, Patient Position: Sitting, Cuff Size: Normal)   Pulse 65   Ht 5\' 8"  (1.727 m)   Wt 212 lb 12.8 oz (96.5 kg)   SpO2 96%   BMI 32.36 kg/m  BMI: Body mass index is 32.36 kg/m.  GEN- The patient is well appearing, alert and oriented x 3 today.   Lungs- Clear to ausculation bilaterally, normal work of breathing.  Heart- Regular rate and rhythm with ectopy, no murmurs, rubs or gallops Extremities- No peripheral edema, warm, dry Skin-  ILR insertion site well-healed  Device interrogation reviewed by myself:   Battery good One CHB event, 3 seconds on 10/10 at 2pm 25% PVC burden   EKG is not ordered. Personal review of EKG from  12/27/2022  shows:  NSR with occasional PVC, rate 67bpm       Recent Labs: 01/23/2022: ALT 19; BUN 37; Creatinine, Ser 1.41; Potassium 3.6; Sodium 140 12/16/2022: Hemoglobin 14.2; Platelets 228; TSH 2.320  No results found for requested labs within last 365 days.   CrCl cannot be calculated (Patient's most recent lab result is older than the maximum 21 days allowed.).   Wt Readings from Last 3 Encounters:  01/10/23 212 lb 12.8 oz (96.5 kg)  01/06/23 215 lb 9.6 oz (97.8 kg)  12/30/22 215 lb 6.1 oz (97.7 kg)     Additional studies reviewed include: Previous EP, cardiology notes.   TTE, 12/30/2022  1. Left ventricular ejection fraction, by estimation, is 55 to 60%. The left ventricle has normal function. The left ventricle has no regional wall motion abnormalities. There is mild left ventricular hypertrophy. Left ventricular diastolic parameters are consistent with Grade I diastolic dysfunction (impaired relaxation). The average left ventricular global longitudinal strain is -16.1 %.   2. Right ventricular systolic function is normal. The right ventricular size is normal.   3. The mitral valve is normal in structure. Mild mitral valve regurgitation. No evidence of mitral stenosis.   4. The aortic valve has an indeterminant number of cusps. There is moderate calcification of the aortic valve. Aortic valve regurgitation is  not visualized. Valve gradient measurements not obtained.   5. The inferior vena cava is normal in size with greater than 50% respiratory variability, suggesting right atrial pressure of 3 mmHg.   Long term monitor, 03/09/2022 Patient had a min HR of 30 bpm, max HR of 125 bpm, and avg HR of 55 bpm. Predominant underlying rhythm was Sinus Rhythm. First Degree AV Block was present. 1 run of Supraventricular Tachycardia occurred lasting 34.3 secs with a  max rate of 125 bpm (avg  112 bpm). Episode of Supraventricular Tachycardia may be possible Atrial Tachycardia with variable block. Second Degree AV Block-Mobitz I (Wenckebach) was present.  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 The study is low risk.   No ST deviation was noted.   LV perfusion is abnormal. There is no evidence of ischemia. There is evidence of infarction. Defect 1: There is a small defect with severe reduction in uptake present in the basal inferior location(s) that is fixed. There is abnormal wall motion in the defect area. Consistent with infarction.   Left ventricular function is normal. End diastolic cavity size is normal. End systolic cavity size is normal.   There is evidence of prior small infarct in the basal inferior wall without ischemia.   CT attenuation images showed moderate aortic and coronary artery calcifications.  TTE, 09/02/2020  1. Left ventricular ejection fraction, by estimation, is 55 to 60%. The left ventricle has normal function. The left ventricle has no regional wall motion abnormalities. There is mild left ventricular hypertrophy. Left ventricular diastolic parameters are consistent with Grade I diastolic dysfunction (impaired relaxation).   2. Right ventricular systolic function is normal. The right ventricular size is normal.   3. The mitral valve is normal in structure. No evidence of mitral valve regurgitation.   4. The aortic valve was not well visualized. Aortic valve regurgitation is not visualized. Mild to moderate aortic valve sclerosis/calcification is present, without any evidence of aortic stenosis.   5. The inferior vena cava is normal in size with greater than 50% respiratory variability, suggesting right atrial pressure of 3 mmHg.       ASSESSMENT AND PLAN:  #) Syncope #) ILR in situ No further  syncopal episodes Continue to monitor via remotes  #) PVC 25% PVC burden via ILR TTE with preserved LVEF Patient asymptomatic of PVCs Continue 12.5mg  toprol daily       Current medicines are reviewed at length with the patient today.   The patient does not have concerns regarding his medicines.  The following changes were made today:  none  Labs/ tests ordered today include:  No orders of the defined types were placed in this encounter.    Disposition: Follow up with Dr. Lalla Brothers or EP APP in 6 months   Signed, Sherie Don, NP  01/10/23  12:27 PM  Electrophysiology [facility]

## 2023-01-10 ENCOUNTER — Encounter: Payer: Self-pay | Admitting: Cardiology

## 2023-01-10 ENCOUNTER — Ambulatory Visit: Payer: Medicare Other | Attending: Cardiology | Admitting: Cardiology

## 2023-01-10 ENCOUNTER — Ambulatory Visit: Payer: Medicare Other

## 2023-01-10 VITALS — BP 132/58 | HR 65 | Ht 68.0 in | Wt 212.8 lb

## 2023-01-10 DIAGNOSIS — I493 Ventricular premature depolarization: Secondary | ICD-10-CM | POA: Insufficient documentation

## 2023-01-10 DIAGNOSIS — R55 Syncope and collapse: Secondary | ICD-10-CM

## 2023-01-10 NOTE — Patient Instructions (Signed)
Medication Instructions:  Your Physician recommend you continue on your current medication as directed.    *If you need a refill on your cardiac medications before your next appointment, please call your pharmacy*   Lab Work: None ordered.  If you have labs (blood work) drawn today and your tests are completely normal, you will receive your results only by: MyChart Message (if you have MyChart) OR A paper copy in the mail If you have any lab test that is abnormal or we need to change your treatment, we will call you to review the results.   Testing/Procedures: None ordered.   Follow-Up: At [facility], you and your health needs are our priority.  As part of our continuing mission to provide you with exceptional heart care, we have created designated Provider Care Teams.  These Care Teams include your primary Cardiologist (physician) and Advanced Practice Providers (APPs -  Physician Assistants and Nurse Practitioners) who all work together to provide you with the care you need, when you need it.  We recommend signing up for the patient portal called "MyChart".  Sign up information is provided on this After Visit Summary.  MyChart is used to connect with patients for Virtual Visits (Telemedicine).  Patients are able to view lab/test results, encounter notes, upcoming appointments, etc.  Non-urgent messages can be sent to your provider as well.   To learn more about what you can do with MyChart, go to [email or web address].    Your next appointment:   6 month(s)  Provider:   Steffanie Dunn, MD or Sherie Don, NP

## 2023-01-11 ENCOUNTER — Telehealth: Payer: Self-pay | Admitting: Physician Assistant

## 2023-01-11 ENCOUNTER — Encounter: Payer: Self-pay | Admitting: Emergency Medicine

## 2023-01-11 ENCOUNTER — Other Ambulatory Visit: Payer: Self-pay

## 2023-01-11 ENCOUNTER — Emergency Department
Admission: EM | Admit: 2023-01-11 | Discharge: 2023-01-11 | Disposition: A | Discharge status: Home / Self Care | Payer: Medicare Other | Attending: Emergency Medicine | Admitting: Emergency Medicine

## 2023-01-11 DIAGNOSIS — R7989 Other specified abnormal findings of blood chemistry: Secondary | ICD-10-CM | POA: Insufficient documentation

## 2023-01-11 DIAGNOSIS — R109 Unspecified abdominal pain: Secondary | ICD-10-CM | POA: Diagnosis not present

## 2023-01-11 DIAGNOSIS — R0902 Hypoxemia: Secondary | ICD-10-CM | POA: Diagnosis not present

## 2023-01-11 DIAGNOSIS — Z1152 Encounter for screening for COVID-19: Secondary | ICD-10-CM | POA: Insufficient documentation

## 2023-01-11 DIAGNOSIS — R112 Nausea with vomiting, unspecified: Secondary | ICD-10-CM | POA: Diagnosis not present

## 2023-01-11 DIAGNOSIS — R197 Diarrhea, unspecified: Secondary | ICD-10-CM | POA: Diagnosis not present

## 2023-01-11 DIAGNOSIS — I959 Hypotension, unspecified: Secondary | ICD-10-CM | POA: Diagnosis not present

## 2023-01-11 DIAGNOSIS — I499 Cardiac arrhythmia, unspecified: Secondary | ICD-10-CM | POA: Diagnosis not present

## 2023-01-11 DIAGNOSIS — R1111 Vomiting without nausea: Secondary | ICD-10-CM | POA: Diagnosis not present

## 2023-01-11 LAB — CBC WITH DIFFERENTIAL/PLATELET
Abs Immature Granulocytes: 0.06 10*3/uL (ref 0.00–0.07)
Basophils Absolute: 0 10*3/uL (ref 0.0–0.1)
Basophils Relative: 0 %
Eosinophils Absolute: 0.1 10*3/uL (ref 0.0–0.5)
Eosinophils Relative: 1 %
HCT: 42.9 % (ref 39.0–52.0)
Hemoglobin: 14.2 g/dL (ref 13.0–17.0)
Immature Granulocytes: 1 %
Lymphocytes Relative: 14 %
Lymphs Abs: 1.4 10*3/uL (ref 0.7–4.0)
MCH: 34 pg (ref 26.0–34.0)
MCHC: 33.1 g/dL (ref 30.0–36.0)
MCV: 102.6 fL — ABNORMAL HIGH (ref 80.0–100.0)
Monocytes Absolute: 0.6 10*3/uL (ref 0.1–1.0)
Monocytes Relative: 6 %
Neutro Abs: 7.9 10*3/uL — ABNORMAL HIGH (ref 1.7–7.7)
Neutrophils Relative %: 78 %
Platelets: 213 10*3/uL (ref 150–400)
RBC: 4.18 MIL/uL — ABNORMAL LOW (ref 4.22–5.81)
RDW: 12.6 % (ref 11.5–15.5)
WBC: 10 10*3/uL (ref 4.0–10.5)
nRBC: 0 % (ref 0.0–0.2)

## 2023-01-11 LAB — RESP PANEL BY RT-PCR (RSV, FLU A&B, COVID)  RVPGX2
Influenza A by PCR: NEGATIVE
Influenza B by PCR: NEGATIVE
Resp Syncytial Virus by PCR: NEGATIVE
SARS Coronavirus 2 by RT PCR: NEGATIVE

## 2023-01-11 LAB — COMPREHENSIVE METABOLIC PANEL
ALT: 20 U/L (ref 0–44)
AST: 23 U/L (ref 15–41)
Albumin: 4.2 g/dL (ref 3.5–5.0)
Alkaline Phosphatase: 62 U/L (ref 38–126)
Anion gap: 15 (ref 5–15)
BUN: 37 mg/dL — ABNORMAL HIGH (ref 8–23)
CO2: 27 mmol/L (ref 22–32)
Calcium: 9.5 mg/dL (ref 8.9–10.3)
Chloride: 96 mmol/L — ABNORMAL LOW (ref 98–111)
Creatinine, Ser: 1.73 mg/dL — ABNORMAL HIGH (ref 0.61–1.24)
GFR, Estimated: 39 mL/min — ABNORMAL LOW (ref >60)
Glucose, Bld: 217 mg/dL — ABNORMAL HIGH (ref 70–99)
Potassium: 3.8 mmol/L (ref 3.5–5.1)
Sodium: 138 mmol/L (ref 135–145)
Total Bilirubin: 0.9 mg/dL (ref <1.2)
Total Protein: 7.4 g/dL (ref 6.5–8.1)

## 2023-01-11 LAB — CUP PACEART REMOTE DEVICE CHECK
Date Time Interrogation Session: 20241125132200
Pulse Gen Serial Number: 109944

## 2023-01-11 LAB — LIPASE, BLOOD: Lipase: 25 U/L (ref 11–51)

## 2023-01-11 MED ORDER — ONDANSETRON HCL 4 MG PO TABS: 4.0000 mg | ORAL_TABLET | Freq: Three times a day (TID) | ORAL | 0 refills | Status: DC | PRN

## 2023-01-11 MED ORDER — SODIUM CHLORIDE 0.9 % IV BOLUS
500.0000 mL | Freq: Once | INTRAVENOUS | Status: AC
  Administered 2023-01-11: 500 mL via INTRAVENOUS

## 2023-01-11 NOTE — ED Provider Notes (Signed)
[facility] Provider Note    Event Date/Time   First MD Initiated Contact with Patient 01/11/23 [phone]     (approximate)   History   Nausea, Emesis, and Diarrhea   HPI  Allen [name] is a 82 y.o. male who presents to the emergency department today because of concerns for nausea vomiting diarrhea.  The patient states that the symptoms started suddenly tonight.  That woke him from sleep.  He went to bed feeling okay.  He denies any nausea or vomiting yesterday.  Was able to eat normally.  He denies any recent illness.  After waking however he had multiple episodes of nonbloody vomiting and diarrhea.  He does complain of some slight abdominal pain after the vomiting.  Denies any fevers.     Physical Exam   Triage Vital Signs: ED Triage Vitals  Encounter Vitals Group     BP 01/11/23 0312 (!) 121/56     Systolic BP Percentile --      Diastolic BP Percentile --      Pulse Rate 01/11/23 0312 71     Resp 01/11/23 0312 20     Temp 01/11/23 0312 97.6 F (36.4 C)     Temp Source 01/11/23 0312 Oral     SpO2 01/11/23 0312 99 %     Weight 01/11/23 0316 213 lb (96.6 kg)     Height 01/11/23 0316 5\' 8"  (1.727 m)     Head Circumference --      Peak Flow --      Pain Score 01/11/23 0316 0     Pain Loc --      Pain Education --      Exclude from Growth Chart --     Most recent vital signs: Vitals:   01/11/23 0312  BP: (!) 121/56  Pulse: 71  Resp: 20  Temp: 97.6 F (36.4 C)  SpO2: 99%   General: Awake, alert, oriented. CV:  Good peripheral perfusion. Regular rate and rhythm. Resp:  Normal effort. Lungs clear. Abd:  No distention. Non tender.    ED Results / Procedures / Treatments   Labs (all labs ordered are listed, but only abnormal results are displayed) Labs Reviewed  CBC WITH DIFFERENTIAL/PLATELET - Abnormal; Notable for the following components:      Result Value   RBC 4.18 (*)    MCV 102.6 (*)    Neutro Abs 7.9 (*)    All other components  within normal limits  COMPREHENSIVE METABOLIC PANEL - Abnormal; Notable for the following components:   Chloride 96 (*)    Glucose, Bld 217 (*)    BUN 37 (*)    Creatinine, Ser 1.73 (*)    GFR, Estimated 39 (*)    All other components within normal limits  RESP PANEL BY RT-PCR (RSV, FLU A&B, COVID)  RVPGX2  LIPASE, BLOOD     EKG  None   RADIOLOGY None   PROCEDURES:  Critical Care performed: No   MEDICATIONS ORDERED IN ED: Medications - No data to display   IMPRESSION / MDM / ASSESSMENT AND PLAN / ED COURSE  I reviewed the triage vital signs and the nursing notes.                              Differential diagnosis includes, but is not limited to, viral illness, food poisoning, pancreatitis, colitis  Patient's presentation is most consistent with acute presentation with potential  threat to life or bodily function.  Patient presents emergency department today because of concerns for nausea vomiting and diarrhea that started suddenly tonight.  No abdominal tenderness on exam.  Blood work without concerning leukocytosis.  Slight elevation creatinine which suggest some dehydration.  Patient was given significant pain.  At this time I think given clinical improvement and reassuring blood work patient can be discharged.  Do not feel emergent we will plan on discharging with prescription for antibiotic.  Encouraged patient return for worsening symptoms.     FINAL CLINICAL IMPRESSION(S) / ED DIAGNOSES   Final diagnoses:  Nausea and vomiting, unspecified vomiting type      Note:  This document was prepared using Dragon voice recognition software and may include unintentional dictation errors.    Phineas Semen, MD 01/11/23 [phone]

## 2023-01-11 NOTE — Discharge Instructions (Signed)
Please seek medical attention for any high fevers, chest pain, shortness of breath, change in behavior, persistent vomiting, bloody stool or any other new or concerning symptoms.  

## 2023-01-11 NOTE — Telephone Encounter (Signed)
Lvm to schedule ED follow up-[name]

## 2023-01-11 NOTE — ED Notes (Signed)
Answered call light, pt asked for urinal, provided.

## 2023-01-16 ENCOUNTER — Encounter (INDEPENDENT_AMBULATORY_CARE_PROVIDER_SITE_OTHER): Payer: Self-pay | Admitting: Vascular Surgery

## 2023-02-07 NOTE — Progress Notes (Signed)
[place] [facility] Recorder 

## 2023-02-07 NOTE — Addendum Note (Signed)
Addended by: Geralyn Flash D on: 02/07/2023 10:29 AM   Modules accepted: Orders

## 2023-02-14 ENCOUNTER — Other Ambulatory Visit: Payer: Self-pay | Admitting: Physician Assistant

## 2023-02-14 ENCOUNTER — Ambulatory Visit (INDEPENDENT_AMBULATORY_CARE_PROVIDER_SITE_OTHER): Payer: Medicare Other

## 2023-02-14 DIAGNOSIS — R55 Syncope and collapse: Secondary | ICD-10-CM | POA: Diagnosis not present

## 2023-02-28 ENCOUNTER — Encounter: Payer: Self-pay | Admitting: Nurse Practitioner

## 2023-03-01 ENCOUNTER — Other Ambulatory Visit: Payer: Self-pay | Admitting: Physician Assistant

## 2023-04-04 ENCOUNTER — Ambulatory Visit (INDEPENDENT_AMBULATORY_CARE_PROVIDER_SITE_OTHER): Payer: Medicare Other | Admitting: Podiatry

## 2023-04-04 DIAGNOSIS — Z91199 Patient's noncompliance with other medical treatment and regimen due to unspecified reason: Secondary | ICD-10-CM

## 2023-04-11 NOTE — Progress Notes (Signed)
 1. No-show for appointment

## 2023-04-25 ENCOUNTER — Other Ambulatory Visit: Payer: Self-pay

## 2023-04-25 DIAGNOSIS — Z79899 Other long term (current) drug therapy: Secondary | ICD-10-CM

## 2023-04-25 NOTE — Patient Instructions (Addendum)
 Hi Mr. Levi,   As a reminder, please try to restart Ozempic at the lowest dose that is tolerated and discuss with your primary care provider before doing this.   To help with the nausea, vomiting, or diarrhea:  1) Try to eat smaller amounts of food and more frequent meals 2) Try to drink more fluids during the day 3) Avoid lying down after having a meal 4)  Provided that 30 min have passed since the last GLP-1 RA dose, eat foods able to ease the symptoms of nausea, such as crackers, apples, mint, ginger root or ginger-based drinks  Thank you for allowing pharmacy to be a part of this patient's care. Cephus Shelling, PharmD Clinical Pharmacist Cell: [phone]

## 2023-04-25 NOTE — Progress Notes (Addendum)
 04/25/2023 Name: Allen [name] MRN: [id number] DOB: 02/10/1941  No chief complaint on file.   Allen [name] is a 83 y.o. year old male who presented for a telephone visit.   They were referred to the pharmacist by a quality report for assistance in managing diabetes.    Subjective:  Care Team: Primary Care Provider: Jacques Mattock, PA-C ; Next Scheduled Visit: 06/16/2023  Medication Access/Adherence  Current Pharmacy:  [id number] [place], [place] - [address] AT [facility] [address] [place] [address] Phone: [phone] Fax: [phone]   Patient reports affordability concerns with their medications: No  He has VA benefits; expensive ones (diabetic medications) are through the [place] Patient reports access/transportation concerns to their pharmacy: No  Patient reports adherence concerns with their medications:  No  it is very rare he forgets to take his medication    Diabetes:  Current medications:  -Insulin  glargine 36 units daily per patient (prescription on file for 26-40 units daily; he reports dose increase about 4 months ago) - Novolog SSI: Average 45- 50 units daily (morning ~ 20 units and evening ~ 30-40 units)  Medications tried in the past: Ozempic (d/c 1 month ago d/t intolerance/emesis; provider is unaware); he was seen in the ED November 2024 for N/V/D that he attributes to  Ozempic - Metformin  500 mg twice daily (no renal adjustment at this time) - Jardiance  25 mg daily   Current glucose readings:  Reports VA A1c ~8.5% (about 4-5 months ago), per patient report He reports average AM: ~ low 200s  Using CGM meter; checking as many times daily as he feels needed -- Unable to verify blood glucose due to lack of access  Patient denies hypoglycemic s/sx including dizziness, shakiness, sweating. Patient reports hyperglycemic symptoms including polyuria, polydipsia, polyphagia, nocturia, neuropathy, blurred  vision.  Current meal patterns:  - Breakfast (typically skips): cornflakes, protein bar - Lunch: skip - Supper (largest meal of the day): Soup, salad, protein meat, veggies, potatoes, rice + beans - Snacks: nuts, Dove chocolates 3-4 pieces (7-8 when his blood glucose is low), mini ice cream bars w/chocolate, "Nabs" - Drinks: flavored white grape water  ** uses insulin  when he eats**  Current physical activity: Sedentary lifestyle due to back pain (ambulates with cane) but is interested in doing some exercise     Objective:  Lab Results  Component Value Date   HGBA1C 8.2 (A) 12/10/2021    Lab Results  Component Value Date   CREATININE 1.73 (H) 01/11/2023   BUN 37 (H) 01/11/2023   NA 138 01/11/2023   K 3.8 01/11/2023   CL 96 (L) 01/11/2023   CO2 27 01/11/2023    Lab Results  Component Value Date   CHOL 139 12/11/2021   HDL 75 12/11/2021   LDLCALC 43 12/11/2021   TRIG 120 12/11/2021   CHOLHDL 1.9 12/27/2016    Medications Reviewed Today     Reviewed by Amedeo Bailiff, RPH (Pharmacist) on 04/25/23 at 1035  Med List Status: <None>   Medication Order Taking? Sig Documenting Provider Last Dose Status Informant  aspirin 81 MG chewable tablet [id number] Yes Chew by mouth daily. [provider] Taking Active Self  b complex vitamins tablet [id number] Yes Take 1 tablet by mouth daily. [provider] Taking Active Self  B-D ULTRA-FINE 33 LANCETS MISC [id number] Yes Use 1 Lancet 3 (three) times daily. [provider] Taking Active  Med Note Amedeo Bailiff   Mon Apr 25, 2023 10:25 AM) Supply  BD PEN NEEDLE NANO 2ND GEN 32G X 4 MM MISC [id number] Yes USE AS DIRECTED FOUR TIMES DAILY WITH LEVEMIR [facility] AND HUMALOG  PEN Khan, Fozia M, MD Taking Active            Med Note Vallarie Gauze, Deaken Jurgens R   Mon Apr 25, 2023 10:25 AM) Supply  cetirizine  (ZYRTEC ) 10 MG tablet [id number] Yes Take 10 mg by mouth daily. [provider] Taking Active  Self  Cholecalciferol  (VITAMIN D3) 1000 units CAPS [id number] Yes Take by mouth daily. [provider] Taking Active   Continuous Blood Gluc Sensor (FREESTYLE LIBRE 14 DAY SENSOR) [place] [id number] Yes Use 1 kit every 14 (fourteen) days McDonough, Lillie Reining, PA-C Taking Active            Med Note Vallarie Gauze, [facility] R   Mon Apr 25, 2023 10:24 AM) Supply  empagliflozin  (JARDIANCE ) 25 MG TABS tablet [id number] Yes Take 1 tablet (25 mg total) by mouth daily before breakfast. Khan, Fozia M, MD Taking Active   ezetimibe -simvastatin  (VYTORIN ) 10-40 MG tablet [id number] Yes TAKE 1 TABLET BY MOUTH DAILY McDonough, Lauren K, PA-C Taking Active   folic acid  (FOLVITE ) 400 MCG tablet [id number] Yes Take 400 mcg by mouth daily. [provider] Taking Active Self  Garlic 1000 MG CAPS [id number] Yes Take by mouth.  Patient taking differently: Take by mouth. One tablet by mouth once daily   [provider] Taking Active   glucose blood test strip [id number] Yes Check blood sugar 5x a day as directed [provider] Taking Active            Med Note Vallarie Gauze, [facility] R   Mon Apr 25, 2023 10:24 AM) Supply  insulin  aspart (NOVOLOG) 100 UNIT/ML FlexPen [id number] Yes INJECT 10 UNITS UNDER SKIN TWICE DAILY BEFORE MORNING AND EVENING MEAL FOR DIABETES.DISCARD PEN 28 DAYS AFTER OPENING PLUS SLIDING SCALE  Patient taking differently: 2 (two) times daily. SSI: 45 - 50 units per day   [provider] Taking Active   Insulin  Glargine (BASAGLAR  KWIKPEN) 100 UNIT/ML [id number]  ADMINISTER 26 TO 40 UNITS UNDER THE SKIN EVERY DAY McDonough, Lauren K, PA-C  Active            Med Note Vallarie Gauze, [facility] R   Mon Apr 25, 2023 10:30 AM) Patient reports taking 36 units daily  levothyroxine  (SYNTHROID ) 75 MCG tablet [id number] Yes TAKE 1 TABLET BY MOUTH DAILY BEFORE BREAKFAST McDonough, Lauren K, PA-C Taking Active   meloxicam (MOBIC) 15 MG tablet [id number] Yes Take 15 mg by mouth daily. [provider] Taking Active   metFORMIN  (GLUCOPHAGE ) 500 MG tablet [id number] Yes TAKE 1 TABLET(500 MG) BY MOUTH TWICE DAILY WITH A MEAL McDonough, Lauren K, PA-C Taking Active   metoprolol  succinate (TOPROL -XL) 25 MG 24 hr tablet [id number] Yes Take 0.5 tablets (12.5 mg total) by mouth daily. McDonough, Lauren K, PA-C Taking Active   Multiple Vitamin (MULTIVITAMIN WITH MINERALS) TABS tablet [id number] Yes Take 1 tablet by mouth daily. [provider] Taking Active Self  mupirocin  ointment (BACTROBAN ) 2 % [id number] Yes Apply 1 Application topically daily. To affected area until healed Laurence Pons, NP Taking Active            Med Note Vallarie Gauze, [facility] R   Mon Apr 25, 2023 10:33 AM) Taking PRN  nystatin  ointment (MYCOSTATIN ) [id number] No Apply 1 application topically 3 (three) times daily.  Patient  not taking: Reported on 04/25/2023   Boscia, Heather E, NP Not Taking Active   ondansetron  (ZOFRAN ) 4 MG tablet [id number] No Take 1 tablet (4 mg total) by mouth every 8 (eight) hours as needed for nausea or vomiting.  Patient not taking: Reported on 04/25/2023   Marylynn Soho, MD Not Taking Consider Medication Status and Discontinue (Completed Course)            Med Note Amedeo Bailiff   Mon Apr 25, 2023 10:34 AM) Completed therapy  Semaglutide,0.25 or 0.5MG /DOS, 2 MG/3ML SOPN [id number] No Inject into the skin.  Patient not taking: Reported on 04/25/2023   [provider] Not Taking Active            Med Note Vallarie Gauze, [facility] R   Mon Apr 25, 2023 10:34 AM) He d/c'd himself d/t intolerance  traMADol (ULTRAM) 50 MG tablet [id number] Yes Take 100 mg by mouth daily.  [provider] Taking Active            Med Note Grier Leber, MARINA  C   Thu Jul 17, 2020 10:46 AM)    valsartan  (DIOVAN ) 160 MG tablet [id number] Yes TAKE 1 TABLET BY MOUTH DAILY McDonough, Lauren K, PA-C Taking Active   vitamin B-12 (CYANOCOBALAMIN ) 1000 MCG tablet [id number] Yes Take 1,000 mcg by mouth daily. [provider] Taking Active Self              Assessment/Plan:   Diabetes: - Currently uncontrolled. Last A1c 9.6% as of 12/16/2022. Per patient, he discontinued Ozempic due to intolerance but encouraged to try to resume the lowest possible tolerated dose.  - Reviewed long term cardiovascular and renal outcomes of uncontrolled blood sugar - Reviewed goal A1c, goal fasting, and goal 2 hour post prandial glucose - Reviewed dietary modifications including alternatives, portion sizes, and more frequent meals - Reviewed lifestyle modifications including: Discussed water aerobics (assisted with a disable chair to get into the water) - Recommend to adjust diet to help better control diabetes and consider Silver Sneakers for physical activity options  - Recommend to check glucose as many times as you have been - Continue current regimen. Patient is encouraged to resume Ozempic or alternative GLP1 agonist.     Time: 1 hr 4 min Follow Up Plan: 4-6 weeks  Thank you for allowing pharmacy to be a part of this patient's care. Alexandria Angel, PharmD Clinical Pharmacist Cell: [phone]

## 2023-05-06 ENCOUNTER — Telehealth: Payer: Self-pay | Admitting: Physician Assistant

## 2023-05-06 NOTE — Telephone Encounter (Signed)
 02/01/22-02/02/23 MR faxed to [facility]; [phone]. Indicated on fax no recent SWO-[name]

## 2023-05-11 ENCOUNTER — Telehealth: Payer: Self-pay | Admitting: Physician Assistant

## 2023-05-11 NOTE — Telephone Encounter (Signed)
 02/01/2022-05/04/2023 office notes pertaining to cpap faxed to [facility]; [phone]

## 2023-05-12 ENCOUNTER — Ambulatory Visit (INDEPENDENT_AMBULATORY_CARE_PROVIDER_SITE_OTHER)

## 2023-05-12 DIAGNOSIS — R001 Bradycardia, unspecified: Secondary | ICD-10-CM

## 2023-05-18 ENCOUNTER — Other Ambulatory Visit: Payer: Self-pay | Admitting: Physician Assistant

## 2023-05-18 LAB — CUP PACEART REMOTE DEVICE CHECK
Date Time Interrogation Session: 20250327205600
Pulse Gen Serial Number: 109944

## 2023-05-21 ENCOUNTER — Encounter: Payer: Self-pay | Admitting: Cardiology

## 2023-06-01 DIAGNOSIS — M5416 Radiculopathy, lumbar region: Secondary | ICD-10-CM | POA: Diagnosis not present

## 2023-06-01 DIAGNOSIS — M48062 Spinal stenosis, lumbar region with neurogenic claudication: Secondary | ICD-10-CM | POA: Diagnosis not present

## 2023-06-02 ENCOUNTER — Other Ambulatory Visit: Payer: Self-pay | Admitting: Family Medicine

## 2023-06-02 DIAGNOSIS — M5416 Radiculopathy, lumbar region: Secondary | ICD-10-CM

## 2023-06-09 ENCOUNTER — Telehealth: Payer: Self-pay

## 2023-06-09 DIAGNOSIS — E1165 Type 2 diabetes mellitus with hyperglycemia: Secondary | ICD-10-CM

## 2023-06-09 NOTE — Progress Notes (Signed)
   06/09/2023  Patient ID: Allen [name], male   DOB: [birth year], 83 y.o.   MRN: [id number]   Patient was identified as falling into the [place] Measure - Diabetes.  Attempted to contact patient for medication management/review. Left HIPAA compliant message for patient to return my call at their convenience.   Attempted for patient outreach. Will follow up with patient next month. Next PCP appointment on 06/16/2023.  Thank you for allowing pharmacy to be a part of this patient's care.  Alexandria Angel, PharmD Clinical Pharmacist Cell: [phone]

## 2023-06-15 ENCOUNTER — Ambulatory Visit
Admission: RE | Admit: 2023-06-15 | Discharge: 2023-06-15 | Disposition: A | Discharge status: Home / Self Care | Source: Ambulatory Visit | Attending: Family Medicine | Admitting: Family Medicine

## 2023-06-15 DIAGNOSIS — M5416 Radiculopathy, lumbar region: Secondary | ICD-10-CM

## 2023-06-15 DIAGNOSIS — M5126 Other intervertebral disc displacement, lumbar region: Secondary | ICD-10-CM | POA: Diagnosis not present

## 2023-06-15 DIAGNOSIS — M48061 Spinal stenosis, lumbar region without neurogenic claudication: Secondary | ICD-10-CM | POA: Diagnosis not present

## 2023-06-16 ENCOUNTER — Encounter

## 2023-06-16 ENCOUNTER — Ambulatory Visit: Payer: Medicare Other | Admitting: Physician Assistant

## 2023-06-22 NOTE — Progress Notes (Signed)
 Carelink Summary Report / Loop Recorder

## 2023-06-22 NOTE — Addendum Note (Signed)
 Addended by: Lott Rouleau A on: 06/22/2023 03:11 PM   Modules accepted: Orders

## 2023-06-27 ENCOUNTER — Ambulatory Visit: Admitting: Cardiology

## 2023-06-27 NOTE — Progress Notes (Unsigned)
 Cardiology Office Note:  .   Date:  06/27/2023  ID:  Allen [name], DOB May 22, 1940, MRN [id number] PCP: Allen Mattock, PA-C  [facility] Providers Cardiologist:  Allen Kirks, MD Electrophysiologist:  Allen Byes, MD { Click to update primary MD,subspecialty MD or APP then REFRESH:1}   History of Present Illness: .   Allen [name] is a 83 y.o. male with a past medical history of coronary artery disease status post MI (1989), hypertension, hyperlipidemia, type 2 diabetes, obesity, peripheral artery disease, and sleep apnea, who presents today for follow-up of his coronary artery disease.   History of coronary artery disease status post MI in 1989 treated with angioplasty at [facility].  Repeat catheterization approximately 6 months later showed an occluded vessel with collaterals and he has been medically managed since this time.  Hospitalized 06/2020 for symptomatic bradycardia.  During visit rates were recorded to be 30 bpm.  While in the emergency department he was found to have a heart rate of 59 bpm was completely asymptomatic and was found to have PVCs on EKG.  Echocardiogram completed in July 2022 revealed LVEF 55 to 2%, no RWMA, mildly LVH, G1 DD, moderate aortic valve sclerosis without significant aortic valve stenosis.  Evaluated by Dr. Marven [name] in 09/2020 for PVCs and bradycardia.  He continued to remain asymptomatic and the decision was made to continue with conservative management strategy.  He was evaluated in the [facility] emergency department with weakness and had an episode of blacking out.  When EMS arrived blood pressure was in the 60s and heart rate was in the 40s.  He was given 500 mL fluid bolus with improvement in heart rate and blood pressure.  He had returned to his baseline and was discharged home with cardiac follow-up.  He was evaluated in clinic 01/2022 where he continued to endorse worsening weakness, fatigue, and shortness of breath with dyspnea on exertion  occasional changes in his vision with blurry vision.  Lexiscan  Myoview  showed evidence of small inferior infarct without significant ischemia.  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  Minimum heart rate was 43 bpm recorded in the afternoon.   He was last seen in clinic 01/10/2023.  Previously in April 2024 he had an ILR implanted for further evaluation of recurrent syncope.  Device clinic was notified 10/11 at daytime falls approximately 2 PM.  He continued to have frequent PVCs noted on EKG and via ILR.  He remained asymptomatic.  I recommended an updated echocardiogram which showed preserved LVEF.  On his last follow-up he was feeling well without any cardiac or vascular complaints.  He had no further syncopal episodes was continued on Toprol  12.5 mg daily.  PVC burden was ~25% via ILR.  He returns to clinic today    ROS: 10 point review of systems has been reviewed and considered negative with exception was been listed in the HPI  Studies Reviewed: Allen [name]        TTE, 12/30/2022  1. Left ventricular ejection fraction, by estimation, is 55 to 60%. The left ventricle has normal function. The left ventricle has no regional wall motion abnormalities. There is mild left ventricular hypertrophy. Left ventricular diastolic parameters are consistent with Grade I diastolic dysfunction (impaired relaxation). The average left ventricular global longitudinal strain is -16.1 %.   2. Right ventricular systolic function is normal. The right ventricular size is normal.   3. The mitral valve is normal in structure. Mild mitral valve regurgitation. No evidence  of mitral stenosis.   4. The aortic valve has an indeterminant number of cusps. There is moderate calcification of the aortic valve. Aortic valve regurgitation is not visualized. Valve gradient measurements not obtained.   5. The inferior vena cava is normal in size with greater than 50% respiratory variability, suggesting right  atrial pressure of 3 mmHg.    Long term monitor, 03/09/2022 Patient had a min HR of 30 bpm, max HR of 125 bpm, and avg HR of 55 bpm. Predominant underlying rhythm was Sinus Rhythm. First Degree AV Block was present. 1 run of Supraventricular Tachycardia occurred lasting 34.3 secs with a max rate of 125 bpm (avg  112 bpm). Episode of Supraventricular Tachycardia may be possible Atrial Tachycardia with variable block. Second Degree AV Block-Mobitz I (Wenckebach) was present.  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 Minimal sinus heart rate of 43 bpm happened at 3:42 PM   NM myocardial, 02/11/2022   Findings are consistent with prior myocardial infarction. The study is low risk.   No ST deviation was noted.   LV perfusion is abnormal. There is no evidence of ischemia. There is evidence of infarction. Defect 1: There is a small defect with severe reduction in uptake present in the basal inferior location(s) that is fixed. There is abnormal wall motion in the defect area. Consistent with infarction.   Left ventricular function is normal. End diastolic cavity size is normal. End systolic cavity size is normal.   There is evidence of prior small infarct in the basal inferior wall without ischemia.   CT attenuation images showed moderate aortic and coronary artery calcifications.   TTE, 09/02/2020  1. Left ventricular ejection fraction, by estimation, is 55 to 60%. The left ventricle has normal function. The left ventricle has no regional wall motion abnormalities. There is mild left ventricular hypertrophy. Left ventricular diastolic parameters are consistent with Grade I diastolic dysfunction (impaired relaxation).   2. Right ventricular systolic function is normal. The right ventricular size is normal.   3. The mitral valve is normal in structure. No evidence of mitral valve regurgitation.   4. The aortic valve was not well visualized. Aortic valve regurgitation is not visualized. Mild to moderate aortic  valve sclerosis/calcification is present, without any evidence of aortic stenosis.   5. The inferior vena cava is normal in size with greater than 50% respiratory variability, suggesting right atrial pressure of 3 mmHg.  Risk Assessment/Calculations:     No BP recorded.  {Refresh Note OR Click here to enter BP  :1}***       Physical Exam:   VS:  There were no vitals taken for this visit.   Wt Readings from Last 3 Encounters:  01/11/23 213 lb (96.6 kg)  01/10/23 212 lb 12.8 oz (96.5 kg)  01/06/23 215 lb 9.6 oz (97.8 kg)    GEN: Well nourished, well developed in no acute distress NECK: No JVD; No carotid bruits CARDIAC: RRR, II/VI murmurs, rubs, gallops RESPIRATORY:  Clear to auscultation without rales, wheezing or rhonchi  ABDOMEN: Soft, non-tender, non-distended EXTREMITIES:  No edema; No deformity   ASSESSMENT AND PLAN: .   Coronary artery disease status post myocardial infarction in 1989 Primary hypertension Mixed hyperlipidemia Peripheral arterial disease Bradycardia with PVCs History of syncope Type 2 diabetes Obstructive sleep apnea    {Are you ordering a CV Procedure (e.g. stress test, cath, DCCV, TEE, etc)?   Press F2        :[id number]}  Dispo: ***  Signed,  Sharda Keddy, NP

## 2023-06-27 NOTE — Progress Notes (Deleted)
 Cardiology Office Note:  .   Date:  06/27/2023  ID:  Allen [name], DOB May 22, 1940, MRN [id number] PCP: Jacques Mattock, PA-C  [facility] Providers Cardiologist:  Antionette Kirks, MD Electrophysiologist:  Boyce Byes, MD { Click to update primary MD,subspecialty MD or APP then REFRESH:1}   History of Present Illness: .   Allen [name] is a 83 y.o. male with a past medical history of coronary artery disease status post MI (1989), hypertension, hyperlipidemia, type 2 diabetes, obesity, peripheral artery disease, and sleep apnea, who presents today for follow-up of his coronary artery disease.   History of coronary artery disease status post MI in 1989 treated with angioplasty at [facility].  Repeat catheterization approximately 6 months later showed an occluded vessel with collaterals and he has been medically managed since this time.  Hospitalized 06/2020 for symptomatic bradycardia.  During visit rates were recorded to be 30 bpm.  While in the emergency department he was found to have a heart rate of 59 bpm was completely asymptomatic and was found to have PVCs on EKG.  Echocardiogram completed in July 2022 revealed LVEF 55 to 2%, no RWMA, mildly LVH, G1 DD, moderate aortic valve sclerosis without significant aortic valve stenosis.  Evaluated by Dr. Marven Slimmer in 09/2020 for PVCs and bradycardia.  He continued to remain asymptomatic and the decision was made to continue with conservative management strategy.  He was evaluated in the [facility] emergency department with weakness and had an episode of blacking out.  When EMS arrived blood pressure was in the 60s and heart rate was in the 40s.  He was given 500 mL fluid bolus with improvement in heart rate and blood pressure.  He had returned to his baseline and was discharged home with cardiac follow-up.  He was evaluated in clinic 01/2022 where he continued to endorse worsening weakness, fatigue, and shortness of breath with dyspnea on exertion  occasional changes in his vision with blurry vision.  Lexiscan  Myoview  showed evidence of small inferior infarct without significant ischemia.  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  Minimum heart rate was 43 bpm recorded in the afternoon.   He was last seen in clinic 01/10/2023.  Previously in April 2024 he had an ILR implanted for further evaluation of recurrent syncope.  Device clinic was notified 10/11 at daytime falls approximately 2 PM.  He continued to have frequent PVCs noted on EKG and via ILR.  He remained asymptomatic.  I recommended an updated echocardiogram which showed preserved LVEF.  On his last follow-up he was feeling well without any cardiac or vascular complaints.  He had no further syncopal episodes was continued on Toprol  12.5 mg daily.  PVC burden was ~25% via ILR.  He returns to clinic today    ROS: 10 point review of systems has been reviewed and considered negative with exception was been listed in the HPI  Studies Reviewed: Aaron Aas        TTE, 12/30/2022  1. Left ventricular ejection fraction, by estimation, is 55 to 60%. The left ventricle has normal function. The left ventricle has no regional wall motion abnormalities. There is mild left ventricular hypertrophy. Left ventricular diastolic parameters are consistent with Grade I diastolic dysfunction (impaired relaxation). The average left ventricular global longitudinal strain is -16.1 %.   2. Right ventricular systolic function is normal. The right ventricular size is normal.   3. The mitral valve is normal in structure. Mild mitral valve regurgitation. No evidence  of mitral stenosis.   4. The aortic valve has an indeterminant number of cusps. There is moderate calcification of the aortic valve. Aortic valve regurgitation is not visualized. Valve gradient measurements not obtained.   5. The inferior vena cava is normal in size with greater than 50% respiratory variability, suggesting right  atrial pressure of 3 mmHg.    Long term monitor, 03/09/2022 Patient had a min HR of 30 bpm, max HR of 125 bpm, and avg HR of 55 bpm. Predominant underlying rhythm was Sinus Rhythm. First Degree AV Block was present. 1 run of Supraventricular Tachycardia occurred lasting 34.3 secs with a max rate of 125 bpm (avg  112 bpm). Episode of Supraventricular Tachycardia may be possible Atrial Tachycardia with variable block. Second Degree AV Block-Mobitz I (Wenckebach) was present.  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 Minimal sinus heart rate of 43 bpm happened at 3:42 PM   NM myocardial, 02/11/2022   Findings are consistent with prior myocardial infarction. The study is low risk.   No ST deviation was noted.   LV perfusion is abnormal. There is no evidence of ischemia. There is evidence of infarction. Defect 1: There is a small defect with severe reduction in uptake present in the basal inferior location(s) that is fixed. There is abnormal wall motion in the defect area. Consistent with infarction.   Left ventricular function is normal. End diastolic cavity size is normal. End systolic cavity size is normal.   There is evidence of prior small infarct in the basal inferior wall without ischemia.   CT attenuation images showed moderate aortic and coronary artery calcifications.   TTE, 09/02/2020  1. Left ventricular ejection fraction, by estimation, is 55 to 60%. The left ventricle has normal function. The left ventricle has no regional wall motion abnormalities. There is mild left ventricular hypertrophy. Left ventricular diastolic parameters are consistent with Grade I diastolic dysfunction (impaired relaxation).   2. Right ventricular systolic function is normal. The right ventricular size is normal.   3. The mitral valve is normal in structure. No evidence of mitral valve regurgitation.   4. The aortic valve was not well visualized. Aortic valve regurgitation is not visualized. Mild to moderate aortic  valve sclerosis/calcification is present, without any evidence of aortic stenosis.   5. The inferior vena cava is normal in size with greater than 50% respiratory variability, suggesting right atrial pressure of 3 mmHg.  Risk Assessment/Calculations:     No BP recorded.  {Refresh Note OR Click here to enter BP  :1}***       Physical Exam:   VS:  There were no vitals taken for this visit.   Wt Readings from Last 3 Encounters:  01/11/23 213 lb (96.6 kg)  01/10/23 212 lb 12.8 oz (96.5 kg)  01/06/23 215 lb 9.6 oz (97.8 kg)    GEN: Well nourished, well developed in no acute distress NECK: No JVD; No carotid bruits CARDIAC: RRR, II/VI murmurs, rubs, gallops RESPIRATORY:  Clear to auscultation without rales, wheezing or rhonchi  ABDOMEN: Soft, non-tender, non-distended EXTREMITIES:  No edema; No deformity   ASSESSMENT AND PLAN: .   Coronary artery disease status post myocardial infarction in 1989 Primary hypertension Mixed hyperlipidemia Peripheral arterial disease Bradycardia with PVCs History of syncope Type 2 diabetes Obstructive sleep apnea    {Are you ordering a CV Procedure (e.g. stress test, cath, DCCV, TEE, etc)?   Press F2        :[id number]}  Dispo: ***  Signed,  Sharda Keddy, NP

## 2023-06-28 ENCOUNTER — Ambulatory Visit: Attending: Cardiology | Admitting: Cardiology

## 2023-06-28 ENCOUNTER — Encounter: Payer: Self-pay | Admitting: Cardiology

## 2023-06-28 VITALS — BP 138/68 | HR 60 | Ht 68.0 in | Wt 203.2 lb

## 2023-06-28 DIAGNOSIS — I2510 Atherosclerotic heart disease of native coronary artery without angina pectoris: Secondary | ICD-10-CM

## 2023-06-28 DIAGNOSIS — E785 Hyperlipidemia, unspecified: Secondary | ICD-10-CM | POA: Diagnosis not present

## 2023-06-28 DIAGNOSIS — G4733 Obstructive sleep apnea (adult) (pediatric): Secondary | ICD-10-CM

## 2023-06-28 DIAGNOSIS — R001 Bradycardia, unspecified: Secondary | ICD-10-CM | POA: Diagnosis not present

## 2023-06-28 DIAGNOSIS — E1165 Type 2 diabetes mellitus with hyperglycemia: Secondary | ICD-10-CM | POA: Diagnosis not present

## 2023-06-28 DIAGNOSIS — I493 Ventricular premature depolarization: Secondary | ICD-10-CM

## 2023-06-28 DIAGNOSIS — I10 Essential (primary) hypertension: Secondary | ICD-10-CM

## 2023-06-28 DIAGNOSIS — I739 Peripheral vascular disease, unspecified: Secondary | ICD-10-CM | POA: Diagnosis not present

## 2023-06-28 NOTE — Patient Instructions (Signed)
 Medication Instructions:  Your Physician recommend you continue on your current medication as directed.    *If you need a refill on your cardiac medications before your next appointment, please call your pharmacy*  Lab Work: No labs ordered today  If you have labs (blood work) drawn today and your tests are completely normal, you will receive your results only by: MyChart Message (if you have MyChart) OR A paper copy in the mail If you have any lab test that is abnormal or we need to change your treatment, we will call you to review the results.  Testing/Procedures: No test ordered today   Follow-Up: At [facility], you and your health needs are our priority.  As part of our continuing mission to provide you with exceptional heart care, our providers are all part of one team.  This team includes your primary Cardiologist (physician) and Advanced Practice Providers or APPs (Physician Assistants and Nurse Practitioners) who all work together to provide you with the care you need, when you need it.  Your next appointment:   6 month(s)  Provider:   Antionette Kirks, MD

## 2023-06-29 DIAGNOSIS — M5416 Radiculopathy, lumbar region: Secondary | ICD-10-CM | POA: Diagnosis not present

## 2023-06-29 DIAGNOSIS — M48062 Spinal stenosis, lumbar region with neurogenic claudication: Secondary | ICD-10-CM | POA: Diagnosis not present

## 2023-06-30 ENCOUNTER — Encounter

## 2023-06-30 ENCOUNTER — Ambulatory Visit (INDEPENDENT_AMBULATORY_CARE_PROVIDER_SITE_OTHER)

## 2023-06-30 DIAGNOSIS — R001 Bradycardia, unspecified: Secondary | ICD-10-CM

## 2023-06-30 LAB — CUP PACEART REMOTE DEVICE CHECK
Date Time Interrogation Session: 20250515003200
Pulse Gen Serial Number: 109944

## 2023-07-03 ENCOUNTER — Ambulatory Visit: Payer: Self-pay | Admitting: Cardiology

## 2023-07-05 ENCOUNTER — Encounter (INDEPENDENT_AMBULATORY_CARE_PROVIDER_SITE_OTHER): Payer: Self-pay

## 2023-07-05 ENCOUNTER — Ambulatory Visit: Admitting: Physician Assistant

## 2023-07-06 ENCOUNTER — Encounter: Payer: Self-pay | Admitting: Cardiology

## 2023-07-06 ENCOUNTER — Ambulatory Visit: Attending: Cardiology | Admitting: Cardiology

## 2023-07-06 VITALS — BP 128/56 | HR 76 | Ht 68.0 in | Wt 200.6 lb

## 2023-07-06 DIAGNOSIS — R55 Syncope and collapse: Secondary | ICD-10-CM

## 2023-07-06 DIAGNOSIS — I10 Essential (primary) hypertension: Secondary | ICD-10-CM | POA: Diagnosis not present

## 2023-07-06 DIAGNOSIS — I493 Ventricular premature depolarization: Secondary | ICD-10-CM | POA: Diagnosis not present

## 2023-07-06 NOTE — Patient Instructions (Signed)

## 2023-07-06 NOTE — Progress Notes (Signed)
  Electrophysiology Office Follow up Visit Note:    Date:  07/06/2023   ID:  Allen [name], DOB 22-Oct-1940, MRN [id number]  PCP:  Jacques Mattock, PA-C  [facility] Cardiologist:  Antionette Kirks, MD  [facility] Electrophysiologist:  Boyce Byes, MD    Interval History:     Allen [name] is a 83 y.o. male who presents for a follow up visit.   The patient last saw Allen [name] January 10, 2023.  He has a history of frequent PVCs, recurrent syncope, loop recorder, coronary disease, hypertension, sleep apnea, diabetes.  At the last appointment with Allen [name], the patient was asymptomatic with his PVCs.  No atrial fibrillation had been detected.  At that appointment his burden of PVCs was 25%.  This downtrended to 17.5% on follow-up interrogation in today is 8.1%.  He doing well today.  He is with his wife who I previously met.  He tells me that his heart is doing well.  He continues to have some back pain for which he is starting physical therapy.  He is optimistic that the physical therapy may help.       Past medical, surgical, social and family history were reviewed.  ROS:   Please see the history of present illness.    All other systems reviewed and are negative.  EKGs/Labs/Other Studies Reviewed:    The following studies were reviewed today:  Loop recorder interrogations reviewed. Episodes of atrial fibrillation are actually sinus rhythm with PVCs.          Physical Exam:    VS:  BP (!) 128/56   Pulse 76   Ht 5\' 8"  (1.727 m)   Wt 200 lb 9.6 oz (91 kg)   SpO2 97%   BMI 30.50 kg/m     Wt Readings from Last 3 Encounters:  07/06/23 200 lb 9.6 oz (91 kg)  06/28/23 203 lb 3.2 oz (92.2 kg)  01/11/23 213 lb (96.6 kg)     GEN: no distress CARD: RRR, No MRG RESP: No IWOB. CTAB.      ASSESSMENT:    1. PVC's (premature ventricular contractions)   2. Essential hypertension   3. Syncope and collapse    PLAN:    In order of problems listed above:  #Frequent  PVCs Continue metoprolol   #Hypertension At goal today.  Recommend checking blood pressures 1-2 times per week at home and recording the values.  Recommend bringing these recordings to the primary care physician.  #Syncope No clear cause.  Continue loop recorder monitoring  Follow-up 1 year with EP APP   Signed, Harvie Liner, MD, [facility], [facility] 07/06/2023 3:41 PM    Electrophysiology [facility]

## 2023-07-13 ENCOUNTER — Ambulatory Visit: Admitting: Cardiology

## 2023-07-13 ENCOUNTER — Other Ambulatory Visit: Payer: Self-pay

## 2023-07-13 DIAGNOSIS — Z79899 Other long term (current) drug therapy: Secondary | ICD-10-CM

## 2023-07-13 NOTE — Progress Notes (Signed)
 07/13/2023 Name: Allen Allen [name] MRN: [id number] DOB: [birth year]  Chief Complaint  Patient presents with   Diabetes    [employer]     Allen Allen [name] is a 83 y.o. year old male who presented for a telephone visit.   They were referred to the pharmacist by a quality report for assistance in managing diabetes.    Subjective:  Allen Allen [name] is a 83 year old male veteran followed by [facility] providers with a PMH significant for diabetes, HTN, and CAD.   Care Team: Primary Care Provider: [place] Allen [name] ; Next Scheduled Visit: 12/22/2023 [facility] Endocrinologist; Next Scheduled Visit: 07/18/2023  Medication Access/Adherence  Current Pharmacy:  [id number] [place], [place] - [address] AT [facility] [address] [place] [address] Phone: [phone] Fax: [phone]   Patient reports affordability concerns with their medications: No  Patient reports access/transportation concerns to their pharmacy: No  Patient reports adherence concerns with their medications:  The patient reports missing a dose approximately once per week, despite using a pillbox. He occasionally takes the day's canister out when he is not home but forgets to take the medications. While he does not miss doses consistently every week, he acknowledges missing them frequently enough to notice an impact. He primarily misses morning doses.   Diabetes:  Current medications: insulin  glargine 32 units daily (taking what was prescribed instead of 36 units which he previously reported during my last encounter; he stated that he has always been taking 32 units), Jardiance  25 mg daily, metformin  500 mg twice daily, Novolog SSI 30-40 units once or twice daily (once daily only when he eats and has been trying twice daily for the last two weeks 2-3 times per week)   Current glucose readings:  - BG today 354 (fasting)  Using CGM meter; testing 4-5 times daily (but reports it makes  him "depressed")  Date of Download (last 7 days): per patient report Average Glucose: 286 mg/dL Glucose Management Indicator: N/A  Glucose Variability: N/A (goal <36%) Time in Goal:  - Time in range 70-180: N/A% - Time above range (250): 74% - Time below range: N/A%   Patient denies hypoglycemic s/sx including dizziness, shakiness, sweating. Patient reports hyperglycemic symptoms including polyuria, polydipsia, nocturia.  Current meal patterns: (typically 1 meal daily) - Breakfast- skips  - Lunch - Skips - Supper around 6 pm bacon and 2 eggs - Snacks ice cream/, fudge popsicles (about once daily after dinner) or "something like that", nabs, apples, watermelon - Drinks: carbonated flavored water (10 g carbs for each serving, per patient)  Current physical activity: No, due back pain; uses walker now rather than pain. He reports that he is in the process of trying to setup PT through [facility]. Encouraged patient call PT.    Hypertension:  Current medications: valsartan  160 mg daily, Toprol -XL 12.5 mg daily    Patient has a validated, automated, upper arm home BP cuff Current blood pressure readings readings:  130-140 BDPs: "down in the 60s" - he doesn't check on regular basis and reports that his BP is slightly lower than the BP at the office - checks BP 1-2 times per week   Patient denies hypotensive s/sx including dizziness, lightheadedness.  Patient denies hypertensive symptoms including headache, chest pain, shortness of breath    Hyperlipidemia/ASCVD Risk Reduction  Current lipid lowering medications: Vytorin  10-40 mg once daily   Antiplatelet regimen: aspirin 81 mg daily  The ASCVD Risk score (Arnett DK, et al., 2019) failed to calculate for the following reasons:   The 2019 ASCVD risk score is only valid for ages 64 to 29    Objective:  Lab Results  Component Value Date   HGBA1C 8.2 (A) 12/10/2021    Lab Results  Component Value Date   CREATININE 1.73  (H) 01/11/2023   Allen [name] 37 (H) 01/11/2023   NA 138 01/11/2023   K 3.8 01/11/2023   CL 96 (L) 01/11/2023   CO2 27 01/11/2023    Lab Results  Component Value Date   CHOL 139 12/11/2021   HDL 75 12/11/2021   LDLCALC 43 12/11/2021   TRIG 120 12/11/2021   CHOLHDL 1.9 12/27/2016    Medications Reviewed Today     Reviewed by Allen Allen [name], RPH (Pharmacist) on 07/13/23 at 1139  Med List Status: <None>   Medication Order Taking? Sig Documenting Provider Last Dose Status Informant  aspirin 81 MG chewable tablet [id number] Yes Chew by mouth daily. [provider] Taking Active Self  b complex vitamins tablet [id number] Yes Take 1 tablet by mouth daily. [provider] Taking Active Self  B-D ULTRA-FINE 33 LANCETS MISC [id number] Yes Use 1 Lancet 3 (three) times daily. [provider] Taking Active            Med Note Preston Brood Apr 25, 2023 10:25 AM) Supply  BD PEN NEEDLE NANO 2ND GEN 32G X 4 MM MISC [id number] Yes USE AS DIRECTED FOUR TIMES DAILY WITH LEVEMIR [facility] AND HUMALOG  PEN [name], Allen M, MD Taking Active            Med Note Vallarie Gauze, Hilja Kintzel R   Mon Apr 25, 2023 10:25 AM) Supply  cetirizine  (ZYRTEC ) 10 MG tablet [id number] Yes Take 10 mg by mouth daily. [provider] Taking Active Self  Cholecalciferol  (VITAMIN D3) 1000 units CAPS [id number] Yes Take by mouth daily. [provider] Taking Active   Continuous Blood Gluc Sensor (FREESTYLE LIBRE 14 DAY SENSOR) [place] [id number] Yes Use 1 kit every 14 (fourteen) days McDonough, Lillie Reining, PA-C Taking Active            Med Note Vallarie Gauze, [facility] R   Mon Apr 25, 2023 10:24 AM) Supply  empagliflozin  (JARDIANCE ) 25 MG TABS tablet [id number] Yes Take 1 tablet (25 mg total) by mouth daily before breakfast. [name], Allen M, MD Taking Active   ezetimibe -simvastatin  (VYTORIN ) 10-40 MG tablet [id number] Yes TAKE 1 TABLET BY MOUTH DAILY McDonough, Lauren K, PA-C Taking Active   folic acid  (FOLVITE )  400 MCG tablet [id number] Yes Take 400 mcg by mouth daily. [provider] Taking Active Self  Garlic 1000 MG CAPS [id number] Yes Take by mouth.  Patient taking differently: Take by mouth. One tablet by mouth once daily   [provider] Taking Active   glucose blood test strip [id number] Yes Check blood sugar 5x a day as directed [provider] Taking Active            Med Note Vallarie Gauze, [facility] R   Mon Apr 25, 2023 10:24 AM) Supply  insulin  aspart (NOVOLOG) 100 UNIT/ML FlexPen [id number] Yes INJECT 10 UNITS UNDER SKIN TWICE DAILY BEFORE MORNING AND EVENING MEAL FOR DIABETES.DISCARD PEN 28 DAYS AFTER OPENING PLUS SLIDING SCALE  Patient taking differently: 2 (two) times daily. SSI: 45 - 50 units per day   [provider] Taking Active  Med Note Alexandria Angel R   Wed Jul 13, 2023 11:34 AM) SSI 30- 40 units (reports mostly 40 units)  Insulin  Glargine (BASAGLAR  KWIKPEN) 100 UNIT/ML [id number] Yes ADMINISTER 26 TO 40 UNITS UNDER THE SKIN EVERY DAY McDonough, Lillie Reining, PA-C Taking Active            Med Note Allen Allen [name]   Wed Jul 13, 2023 11:34 AM) Patient reports taking 32 units daily  levothyroxine  (SYNTHROID ) 75 MCG tablet [id number] Yes TAKE 1 TABLET BY MOUTH DAILY BEFORE BREAKFAST McDonough, Lauren K, PA-C Taking Active   meloxicam (MOBIC) 15 MG tablet [id number] Yes Take 15 mg by mouth daily. [provider] Taking Active   metFORMIN  (GLUCOPHAGE ) 500 MG tablet [id number] Yes TAKE 1 TABLET(500 MG) BY MOUTH TWICE DAILY WITH A MEAL McDonough, Lauren K, PA-C Taking Active   metoprolol  succinate (TOPROL -XL) 25 MG 24 hr tablet [id number] Yes Take 0.5 tablets (12.5 mg total) by mouth daily. McDonough, Lauren K, PA-C Taking Active   Multiple Vitamin (MULTIVITAMIN WITH MINERALS) TABS tablet [id number] Yes Take 1 tablet by mouth daily. [provider] Taking Active Self  mupirocin  ointment (BACTROBAN ) 2 % [id number] No Apply 1 Application  topically daily. To affected area until healed  Patient not taking: Reported on 07/13/2023   Laurence Pons, NP Not Taking Active            Med Note Allen Allen [name]   Mon Apr 25, 2023 10:33 AM) Taking PRN  nystatin  ointment (MYCOSTATIN ) [id number] No Apply 1 application topically 3 (three) times daily.  Patient not taking: Reported on 07/13/2023   Sharyon Deis, NP Not Taking Active   Semaglutide,0.25 or 0.5MG /DOS, 2 MG/3ML SOPN [id number] No Inject into the skin.  Patient not taking: Reported on 07/13/2023   [provider] Not Taking Active            Med Note Alexandria Angel R   Wed Jul 13, 2023 11:36 AM) He cannot tolerate   traMADol (ULTRAM) 50 MG tablet [id number] Yes Take 100 mg by mouth daily.  [provider] Taking Active            Med Note Grier Leber, MARINA  C   Thu Jul 17, 2020 10:46 AM)    valsartan  (DIOVAN ) 160 MG tablet [id number] Yes TAKE 1 TABLET BY MOUTH DAILY McDonough, Lauren K, PA-C Taking Active   vitamin B-12 (CYANOCOBALAMIN ) 1000 MCG tablet [id number] Yes Take 1,000 mcg by mouth daily. [provider] Taking Active Self              Assessment/Plan:     Diabetes: - Currently uncontrolled. The patient stated that his last visit with [facility] provider was last year, which aligns with the most recent hemoglobin A1c available in Care Everywhere. However, no recent A1c is currently on record. We discussed monitoring blood glucose using LibreView, and the access code ([id number]) was shared with the patient, as he mentioned he does not frequently use MyChart. His diet was also reviewed and discussed during the visit.  - Reviewed long term cardiovascular and renal outcomes of uncontrolled blood sugar - Reviewed goal A1c, goal fasting, and goal 2 hour post prandial glucose - Reviewed dietary modifications including switch to fudge popsicles (lower glycemic index), well-balanced dinner & add breakfast (such as bacon, eggs), water enhancer (such  as MIO, mindful of dyes) that have no carbohydrates or lower carbohydrates - Reviewed lifestyle modifications including: Encouraged patient to contact PT  - Recommend to continue current  regimen and encouraged to eat more to use prandial insulin  regularly. Patient is followed by endocrinologist.  - Recommend to check glucose 2-4 times daily (fasting, post prandial, and random BG since he eats once daily)      Hypertension: - Currently stable - Reviewed long term cardiovascular and renal outcomes of uncontrolled blood pressure - Reviewed appropriate blood pressure monitoring technique and reviewed goal blood pressure. Recommended to check home blood pressure and heart rate 1-2 times per week (consistently) - Recommend to continue current regimen.       Hyperlipidemia/ASCVD Risk Reduction: - Currently controlled, per last office visit labs in 2023; no recent lipid panel in Care Everywhere - Reviewed long term complications of uncontrolled cholesterol - Recommend to continue current regimen. Defer to provider for updated lipid panel.    Medication Management:  Due to patient's age > 8 years old, it is recommended to reduce Zyrtec  to 5 mg daily. Patient made aware and stated he would "look at it". Will collaborate with PCP about dose adjustment.  - Dicussed using a medication alarm for morning and evening medications (oral/injectable) to help improve adherence and patient is agreeable. - Encouraged patient to contact PT to begin sessions for back pain    Follow Up Plan: Appointment w/Endo 07/18/2023, then 4-6 weeks with PharmD   Alexandria Angel, PharmD Clinical Pharmacist Cell: [phone]

## 2023-07-19 ENCOUNTER — Ambulatory Visit: Attending: Family Medicine

## 2023-07-19 DIAGNOSIS — R2681 Unsteadiness on feet: Secondary | ICD-10-CM | POA: Diagnosis not present

## 2023-07-19 DIAGNOSIS — M5442 Lumbago with sciatica, left side: Secondary | ICD-10-CM | POA: Insufficient documentation

## 2023-07-19 DIAGNOSIS — R262 Difficulty in walking, not elsewhere classified: Secondary | ICD-10-CM

## 2023-07-19 DIAGNOSIS — G8929 Other chronic pain: Secondary | ICD-10-CM | POA: Diagnosis not present

## 2023-07-19 NOTE — Therapy (Signed)
 OUTPATIENT PHYSICAL THERAPY THORACOLUMBAR EVALUATION   Patient Name: Allen [name] MRN: [id number] DOB:Oct 31, 1940, 83 y.o., male Today's Date: 07/19/2023  END OF SESSION:  PT End of Session - 07/19/23 1609     Visit Number 1    Number of Visits 16    Date for PT Re-Evaluation 09/13/23    Authorization Type Medicare A&B; [employer] supplemental    Authorization Time Period 07/19/23-09/13/23    Authorization - Visit Number 1    Authorization - Number of Visits 16    Progress Note Due on Visit 10    PT Start Time 1600    PT Stop Time 1630    PT Time Calculation (min) 30 min    Equipment Utilized During Treatment Gait belt    Activity Tolerance Patient tolerated treatment well;No increased pain    Behavior During Therapy WFL for tasks assessed/performed             Past Medical History:  Diagnosis Date   Coronary artery disease    a. 1989 s/p MI w/ PTCA to unknown vessel. Reports cath 6 months later with occluded vessel and collaterals--> medically managed since..   Diabetes mellitus without complication (HCC)    Hyperlipidemia LDL goal <70    Hypertension    a. 02/2020 nl renal artery duplex.   MI (myocardial infarction) (HCC)    PAD (peripheral artery disease) (HCC)    a. 12/2019 ABIs: R 1.02, L 0.56-->unchanged.   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   Regular astigmatism 08/11/2022   Atherosclerosis of native arteries of extremity with intermittent claudication (HCC) 01/03/2022   Need for shingles vaccine 01/22/2019   Need for vaccination against Streptococcus pneumoniae using pneumococcal conjugate vaccine 7  01/22/2019   Encounter for general adult medical examination with abnormal findings 01/18/2018   Abnormal liver function 01/18/2018   S/P laparoscopic cholecystectomy 01/18/2018   Bradycardia 01/18/2018   Cutaneous candidiasis 01/18/2018   Oral candidiasis 01/18/2018   Septic shock (HCC) 01/08/2018   Ascending cholangitis 01/08/2018   CAD (coronary artery disease) 01/08/2018   Sepsis (HCC) 01/08/2018   Insect bite of right lower leg 09/25/2017   Atopic dermatitis 09/25/2017   PAD (peripheral artery disease) (HCC) 11/24/2015   Type 2 diabetes mellitus with hyperglycemia, without long-term current use of insulin  (HCC) 11/24/2015   Hyperlipidemia 11/24/2015   Essential hypertension 11/24/2015   PVD (peripheral vascular disease) (HCC) 10/29/2015   DDD (degenerative disc disease), lumbar 01/30/2015   Lumbar radiculitis 01/30/2015   Osteoarthritis of spine with radiculopathy, 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   Difficulty walking 08/22/2014   History of coronary artery disease 06/14/2014   Ataxia 09/10/2013   Back pain 09/10/2013   Morbid obesity due to excess calories (HCC) 09/10/2013   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   Deficiency of other specified B group vitamins 08/28/2013   Hemorrhage of rectum and anus 05/24/2013    PCP: ***  REFERRING PROVIDER: ***  REFERRING DIAG: ***  Rationale for Evaluation and Treatment: {HABREHAB:27488}  THERAPY DIAG:  Chronic bilateral low back pain with left-sided sciatica  Difficulty in walking, not elsewhere classified  Unsteadiness on feet  ONSET DATE: ***  SUBJECTIVE:                                                                                                                                                                                           SUBJECTIVE STATEMENT: ***  PERTINENT HISTORY:  ***  PAIN:  Are you having pain? {OPRCPAIN:27236}  PRECAUTIONS: {Therapy precautions:24002}  RED FLAGS: {PT Red Flags:29287}   WEIGHT  BEARING RESTRICTIONS: {Yes ***/No:24003}  FALLS:  Has patient fallen in last 6 months? {fallsyesno:27318}  LIVING ENVIRONMENT: Lives with: {OPRC lives with:25569::"lives with their family"} Lives in: {Lives in:25570} Stairs: {opstairs:27293} Has following equipment at home: {Assistive devices:23999}  OCCUPATION: ***  PLOF: {PLOF:24004}  PATIENT GOALS: ***  NEXT MD VISIT: ***  OBJECTIVE:  Note: Objective measures were completed at Evaluation unless otherwise noted.  DIAGNOSTIC FINDINGS:  ***  PATIENT SURVEYS:  {rehab surveys:24030}  COGNITION: Overall cognitive status: {cognition:24006}     SENSATION: {sensation:27233}  MUSCLE LENGTH: Hamstrings: Right *** deg; Left *** deg Andy Bannister test: Right *** deg; Left *** deg  POSTURE: {posture:25561}  PALPATION: ***  LUMBAR ROM:   AROM eval  Flexion   Extension   Right lateral flexion   Left lateral flexion   Right rotation   Left rotation    (Blank rows = not tested)  LOWER EXTREMITY ROM:     {AROM/PROM:27142}  Right eval Left eval  Hip flexion    Hip extension    Hip abduction    Hip adduction    Hip internal rotation    Hip external rotation    Knee flexion    Knee extension    Ankle dorsiflexion    Ankle plantarflexion    Ankle inversion    Ankle eversion     (Blank rows = not tested)  LOWER EXTREMITY MMT:    MMT Right eval Left eval  Hip flexion    Hip extension    Hip abduction    Hip adduction    Hip internal rotation    Hip external rotation    Knee flexion    Knee extension    Ankle dorsiflexion    Ankle plantarflexion    Ankle inversion    Ankle eversion     (Blank rows = not tested)  LUMBAR SPECIAL TESTS:  {lumbar special test:25242}  FUNCTIONAL TESTS:  {Functional tests:24029}  GAIT: Distance walked: *** Assistive device utilized: {Assistive devices:23999} Level of assistance: {Levels of assistance:24026} Comments: ***  TREATMENT DATE: ***  PATIENT EDUCATION:  Education details: *** Person educated: {Person educated:25204} Education method: {Education Method:25205} Education comprehension: {Education Comprehension:25206}  HOME EXERCISE PROGRAM: ***  ASSESSMENT:  CLINICAL IMPRESSION: Patient is a *** y.o. *** who was seen today for physical therapy evaluation and treatment for ***.   OBJECTIVE IMPAIRMENTS: {opptimpairments:25111}.   ACTIVITY LIMITATIONS: {activitylimitations:27494}  PARTICIPATION LIMITATIONS: {participationrestrictions:25113}  PERSONAL FACTORS: {Personal factors:25162} are also affecting patient's functional outcome.   REHAB POTENTIAL: {rehabpotential:25112}  CLINICAL DECISION MAKING: {clinical decision making:25114}  EVALUATION COMPLEXITY: {Evaluation complexity:25115}   GOALS: Goals reviewed with patient? Yes  SHORT TERM GOALS: Target date: 08/18/23  TUG improvement by >5sec Baseline: 07/19/23: 22.45sec c 4WW Goal status: INITIAL  2.  5xSTS improved in seat height by >2 inches.  Baseline: 07/19/23: 22" required for hands free testing 16.7sec Goal status: INITIAL  3.  increase >170ft Baseline: scheduled for testing on visit 2 Goal status: INITIAL  LONG TERM GOALS: Target date: ***  *** Baseline:  Goal status: INITIAL  2.  *** Baseline:  Goal status: INITIAL  3.  *** Baseline:  Goal status: INITIAL  4.  *** Baseline:  Goal status: INITIAL  PLAN:  PT FREQUENCY: {rehab frequency:25116}  PT DURATION: {rehab duration:25117}  PLANNED INTERVENTIONS: {rehab planned interventions:25118::"97110-Therapeutic exercises","97530- Therapeutic [phone]- Neuromuscular re-education","97535- Self JXBJ","47829- Manual therapy"}   PLAN FOR NEXT SESSION: ***   Keysean Savino C, PT 07/19/2023, 4:17 PM

## 2023-07-21 ENCOUNTER — Encounter

## 2023-07-21 ENCOUNTER — Ambulatory Visit

## 2023-07-21 ENCOUNTER — Encounter: Payer: Self-pay | Admitting: Cardiology

## 2023-07-21 DIAGNOSIS — R2681 Unsteadiness on feet: Secondary | ICD-10-CM

## 2023-07-21 DIAGNOSIS — R262 Difficulty in walking, not elsewhere classified: Secondary | ICD-10-CM

## 2023-07-21 DIAGNOSIS — G8929 Other chronic pain: Secondary | ICD-10-CM

## 2023-07-21 DIAGNOSIS — M5442 Lumbago with sciatica, left side: Secondary | ICD-10-CM | POA: Diagnosis not present

## 2023-07-21 NOTE — Therapy (Signed)
 OUTPATIENT PHYSICAL THERAPY TREATMENT  Patient Name: Cordero Surette MRN: [id number] DOB:07/10/1940, 83 y.o., male Today's Date: 07/21/2023  END OF SESSION:  PT End of Session - 07/21/23 1606     Visit Number 2    Number of Visits 16    Date for PT Re-Evaluation 09/13/23    Authorization Type Medicare A&B; [employer] supplemental    Authorization Time Period 07/19/23-09/13/23    Authorization - Visit Number 2    Authorization - Number of Visits 16    Progress Note Due on Visit 10    PT Start Time 1600    PT Stop Time 1640    PT Time Calculation (min) 40 min    Activity Tolerance Patient tolerated treatment well;No increased pain    Behavior During Therapy WFL for tasks assessed/performed            Past Medical History:  Diagnosis Date   Coronary artery disease    a. 1989 s/p MI w/ PTCA to unknown vessel. Reports cath 6 months later with occluded vessel and collaterals--> medically managed since..   Diabetes mellitus without complication (HCC)    Hyperlipidemia LDL goal <70    Hypertension    a. 02/2020 nl renal artery duplex.   MI (myocardial infarction) (HCC)    PAD (peripheral artery disease) (HCC)    a. 12/2019 ABIs: R 1.02, L 0.56-->unchanged.   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   Regular astigmatism 08/11/2022   Atherosclerosis of native arteries of extremity with intermittent claudication (HCC) 01/03/2022   Need for shingles vaccine 01/22/2019   Need for vaccination against Streptococcus pneumoniae using pneumococcal conjugate vaccine 7 01/22/2019   Encounter for general adult medical examination with abnormal  findings 01/18/2018   Abnormal liver function 01/18/2018   S/P laparoscopic cholecystectomy 01/18/2018   Bradycardia 01/18/2018   Cutaneous candidiasis 01/18/2018   Oral candidiasis 01/18/2018   Septic shock (HCC) 01/08/2018   Ascending cholangitis 01/08/2018   CAD (coronary artery disease) 01/08/2018   Sepsis (HCC) 01/08/2018   Insect bite of right lower leg 09/25/2017   Atopic dermatitis 09/25/2017   PAD (peripheral artery disease) (HCC) 11/24/2015   Type 2 diabetes mellitus with hyperglycemia, without long-term current use of insulin  (HCC) 11/24/2015   Hyperlipidemia 11/24/2015   Essential hypertension 11/24/2015   PVD (peripheral vascular disease) (HCC) 10/29/2015   DDD (degenerative disc disease), lumbar 01/30/2015   Lumbar radiculitis 01/30/2015   Osteoarthritis of spine with radiculopathy, 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   Difficulty walking 08/22/2014   History of coronary artery disease 06/14/2014   Ataxia 09/10/2013   Back pain 09/10/2013   Morbid obesity due to excess calories (HCC) 09/10/2013   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   Deficiency of other specified B group vitamins 08/28/2013   Hemorrhage of rectum and anus 05/24/2013    PCP: Jacques Mattock, PA-C REFERRING PROVIDER: Adelle Hong, FNP [facility])  REFERRING DIAG: Spinal Stenosis, imbalance, difficulty walking   Rationale for Evaluation and Treatment: Rehabilitation THERAPY DIAG:  Chronic bilateral low back pain with left-sided sciatica  Difficulty in walking, not elsewhere classified  Unsteadiness on feet  ONSET  DATE: Chronic > 10 years   SUBJECTIVE:                                                                                                                                                                                          SUBJECTIVE STATEMENT: Pt reports pain a little elevated today 8-9/10. He performed HEP handout without issue.   PERTINENT HISTORY:  83 year old man referred to PT  by physiatry due to progressive back pain and balance and difficulty walking, patient has known lumbar spinal stenosis.  Patient is well-known to this clinic was seen in 2016 and 2021 for same issue.  Patient has remained diligent with his exercises and remaining mobile however things have gotten worse and his pain is much more limiting while being in a standing position even more so with walking.  Patient is very unsteady at current baseline but is typically able to avoid falling.  Reports 2 falls in the past year.  Patient manages his pain with Tylenol  and/or meloxicam as needed.  Patient uses a single-point cane or occasionally a 4 [employer].  Patient is unable to undergo back injections at present due to poor glucose control.  Patient reports he was recommended to have surgery for this years ago but declined at that time.  He is unsure if surgery is still a recommendation. Average pain 6 out of 10, worst pain 8 out of 10 with walking, best pain 2 out of 10 in the 7 days prior to evaluation.    PAIN:  Are you having pain? Yes 8/10 across low back, into Rt flank, bilat posterior hips. PRECAUTIONS: high falls risk WEIGHT BEARING RESTRICTIONS: No FALLS:  Has patient fallen in last 6 months? 2 which he reports were avoidable, related to poor decision planning in the shower, bending without support.  LIVING ENVIRONMENT: Lives with: Wife, dog Lives in: house Stairs: 7 partial steps with hand support, adequately deep, patient reports he is able to navigate these without difficulty Has following equipment at home: [facility], 0JW  OCCUPATION: Retired. PLOF: Modified independent.  PATIENT GOALS: Patient wants to walk better, stand longer, maybe even have some pain relief.  NEXT MD VISIT: Unclear however at time of evaluation most recent lumbar MRI is posted and he would likely schedule in clinic to discuss results of imaging  OBJECTIVE:  Note: Objective measures were completed at Evaluation unless otherwise  noted.  DIAGNOSTIC FINDINGS:  Lumbar MRI 06/29/2023 "CLINICAL DATA:  Lower back pain for 5 years.   EXAM: MRI LUMBAR SPINE WITHOUT CONTRAST   TECHNIQUE: Multiplanar, multisequence MR imaging of the lumbar spine was performed. No intravenous contrast was administered.  COMPARISON:  MRI lumbar spine 08/28/2014.   FINDINGS: Segmentation:  Standard.   Alignment: Lumbar lordosis is maintained. Grade 1 anterolisthesis of L3 on L4 and of L4 on L5. 4 mm retrolisthesis of L5 on S1.   Vertebrae: Prominent Modic type 1 degenerative endplate changes at L2-3 with associated discogenic edema which is new from prior. No additional bone marrow edema or evidence of fracture. Hemangioma in the L2 vertebral body.   Conus medullaris and cauda equina: Conus extends to the L1 level. Conus and cauda equina appear normal.   Paraspinal and other soft tissues: The visualized paraspinal soft tissues are unremarkable.   Disc levels:  T12-L1: No significant disc bulge. No significant spinal canal stenosis or foraminal stenosis.   L1-2: No significant disc bulge. No significant spinal canal stenosis or foraminal stenosis. Mild facet arthrosis.   L2-3: Disc desiccation and moderate disc height loss. Diffuse disc bulge eccentric to the left which indents the ventral thecal sac resulting in lateral recess narrowing. Moderate facet arthrosis with bilateral facet effusions which indent the dorsal thecal sac more pronounced on the left. Severe spinal canal stenosis noted. Moderate bilateral foraminal stenosis slightly greater on the left. Facet osteophytes likely contact the exiting left L2 nerve root.   L3-4: Mild disc height loss. Diffuse disc bulge which indents the ventral thecal sac with lateral recess narrowing. Severe facet arthrosis. Small facet effusion on the left. Moderate to severe spinal canal stenosis at this level with significant crowding of the cauda equina nerve roots. Moderate  bilateral foraminal stenosis.   L4-5: Grade 1 anterolisthesis with partial uncovering of the disc. Diffuse disc bulge with lateral recess narrowing. Severe facet arthrosis and bilateral facet effusions. Mild spinal canal stenosis. There is lateral recess narrowing with possible impingement upon the traversing nerve roots. Moderate bilateral foraminal stenosis.   L5-S1: Retrolisthesis. No significant spinal canal stenosis. Mild facet arthrosis. Moderate right and mild left foraminal stenosis.   IMPRESSION: Degenerative changes of the lumbar spine as above. Severe spinal canal stenosis at L2-3 and additional moderate to severe spinal canal stenosis at L3-4.   Disc bulge at L4-5 resulting in lateral recess narrowing with likely impingement upon the traversing nerve roots.   Moderate foraminal stenosis at multiple levels. Facet osteophytes at L2-3 likely contact the exiting left L2 nerve root.   Facet arthrosis throughout the lumbar spine with facet effusions noted at multiple levels.   Degenerative endplate changes and discogenic edema at L2-3 which may contribute to back pain.    Electronically Signed   By: Denny Flack M.D.   On: 06/29/2023 13:53"  PATIENT SURVEYS:   Will issue MODI on visit 2  SENSATION: Not assessed, will defer to physiatry notes.  PALPATION: Taut paraspinal muscles along the Rt lumbar region in particular.  LOWER EXTREMITY MMT:  will complete on visit 2 FUNCTIONAL TESTS:  5 times sit to stand: 16.70sec hands on knees, 22" surface height  Timed up and go (TUG): c 4WW 22.45sec 10 meter walk test: c SPC, 0.68m/s, ataxic without LOB  (07/21/23): 4WW 468ft 3 minutes 48sec, unable to conitnue due to leg weakness  GAIT: Distance walked: waiting area to exam room 2 c SPC, very small steps, very slow, appears unsteady, holds back with left hand for antalgia- modI level.   BALANCE:  [facility] PT Assessment - 07/21/23 0001       Berg Balance Test   Sit to  Stand Able to stand using hands after several tries  Standing Unsupported Able to stand safely 2 minutes    Sitting with Back Unsupported but Feet Supported on Floor or Stool Able to sit safely and securely 2 minutes    Stand to Sit Sits safely with minimal use of hands    Transfers Able to transfer safely, minor use of hands    Standing Unsupported with Eyes Closed Able to stand 3 seconds    Standing Unsupported with Feet Together Needs help to attain position and unable to hold for 15 seconds    From Standing, Reach Forward with Outstretched Arm Can reach forward >12 cm safely (5")    From Standing Position, Pick up Object from Floor Unable to try/needs assist to keep balance    From Standing Position, Turn to Look Behind Over each Shoulder --   will finish on visit 3             TREATMENT DATE 07/21/23:                                                                                                                               -heat application to back  -AA/ROM and cardio aerobic conditioning on Nustep: Level 1 x 4 minutes, 60 sec rest, L1x 4 minutes: seat 9, arms 10, concurrent heat application to back. - : 429ft 3 minutes 48sec, unable to conitnue due to leg weakness (back pain actually feels better)  -BBT started (will finish visit 3)  -physioball roll outs x20 c 4WW instead of ball (stretch achieved, well tolerated)  -seated marching 20x1secH, alternating sides  -STS from EOB hands on knees (22" height) 1x10 -seated marching 20x1secH, alternating sides  -STS from EOB hands on knees (22" height) 1x13 *Pain down to 6/10 at end of session *one LOB AMB to exit at end of session, author provided minGuardA to car: also noted other LOB with downhill grade toward parking lot and handicap ramp to parking space.   PATIENT EDUCATION:  Education details: Role and use of 4WW, heat v ice application to back  Person educated: patient  Education method: collaborative learning,  deliberate practice, positive reinforcement, explicit instruction, establish rules. Education comprehension: Acknowledges, will need reinforcement   HOME EXERCISE PROGRAM: Access Code: [email or web address] URL: [email or web address] Date: 07/19/2023 Prepared by: Atlee Blanks  Exercises - Sit to Stand with Hands on Knees  - 3 x daily - 7 x weekly - 1 sets - 10 reps -  22" seat height - Standing March with Counter Support  - 3 x daily - 7 x weekly - 1 sets - 20 reps - Side Stepping with Counter Support  - 3 x daily - 7 x weekly - 1 sets - 20 reps *walk twice daily with 4WW or shopping buggy (outside of regular ADL walking)   ASSESSMENT: CLINICAL IMPRESSION: HEP successfully implemented since evaluation. Began gentle ROM and functional activity integration today with modifications to improve tolerance. Pt has an improvement in pain from start to finish  of session. and BBT both revealing of significant deficits. Pt really is not appropriate for SPC use anymore due to degree of instability, frequency of LOB, and limitations of righting reactions, however he is determined to not use 4WW as a primary device. Physical therapy will help patient achieve long-term goals of care improved safety, improve quality of life and retain the ability to be a community dwelling adult.  OBJECTIVE IMPAIRMENTS: Abnormal gait, decreased activity tolerance, decreased balance, decreased knowledge of use of DME, decreased mobility, difficulty walking, decreased ROM, decreased strength, hypomobility, increased muscle spasms, and impaired flexibility.  ACTIVITY LIMITATIONS: carrying, lifting, bending, standing, squatting, transfers, bathing, toileting, dressing, and locomotion level PARTICIPATION LIMITATIONS: meal prep, cleaning, laundry, interpersonal relationship, shopping, community activity, and yard work PERSONAL FACTORS: Age, Past/current experiences, and Time since onset of injury/illness/exacerbation are also  affecting patient's functional outcome.  REHAB POTENTIAL: Good CLINICAL PRESENTATION: Stable/uncomplicated EVALUATION COMPLEXITY: Moderate  GOALS: Goals reviewed with patient? Yes  SHORT TERM GOALS: Target date: 08/18/23 TUG improvement by >5sec Baseline: 07/19/23: 22.45sec c 4WW Goal status: INITIAL 2.  5xSTS improved in seat height by >2 inches.  Baseline: 07/19/23: 22" required for hands free testing 16.7sec Goal status: INITIAL 3.  increase >149ft Baseline: 472ft on 07/21/23 c 4WW;  Goal status: advancing  LONG TERM GOALS: Target date: 10/19/23 Patient will demonstrate with LRAD greater than 1200 feet to demonstrate safe ability to perform limited community distance walking Baseline: 48ft on 07/21/23 c 4WW; Goal status: Initial 2.  Patient will perform 5 times STS less than 15s hands-free from standard seat height. Baseline: Requires 22 inch seat height Goal status: INITIAL 3.  Patient to demonstrate TUG test less than 10 seconds device ad lib. to indicate reduced falls risk.  Baseline: 22.45 seconds with rollator Goal status: INITIAL 4.  Patient to score greater than 10 point improvement on BBT to indicate improved balance performance Baseline: commenced visit 2, will complete visit 3  Goal status: INITIAL  PLAN: PT FREQUENCY: 1-2x/week PT DURATION: 12 weeks PLANNED INTERVENTIONS: 97110-Therapeutic exercises, 97530- Therapeutic activity, 97112- Neuromuscular re-education, 97535- Self Care, 16109- Manual therapy, [phone]- Gait training, Balance training, Stair training, Joint mobilization, Joint manipulation, Spinal manipulation, Spinal mobilization, Scar mobilization, and Vestibular training  PLAN FOR NEXT SESSION: complete BBT, continue with Nustep intervals, STS, marching, rollouts, ankle strength; minGuard assist  Jamye Balicki C, PT 07/21/2023, 4:07 PM   4:07 PM, 07/21/23 Dawn Eth, PT, DPT Physical Therapist -  [phone] (Office)

## 2023-07-25 ENCOUNTER — Ambulatory Visit

## 2023-07-25 DIAGNOSIS — R2681 Unsteadiness on feet: Secondary | ICD-10-CM | POA: Diagnosis not present

## 2023-07-25 DIAGNOSIS — R262 Difficulty in walking, not elsewhere classified: Secondary | ICD-10-CM | POA: Diagnosis not present

## 2023-07-25 DIAGNOSIS — G8929 Other chronic pain: Secondary | ICD-10-CM | POA: Diagnosis not present

## 2023-07-25 DIAGNOSIS — M5442 Lumbago with sciatica, left side: Secondary | ICD-10-CM | POA: Diagnosis not present

## 2023-07-25 NOTE — Therapy (Signed)
 OUTPATIENT PHYSICAL THERAPY TREATMENT  Patient Name: Allen [name] MRN: [id number] DOB:[birth year], 83 y.o., male Today's Date: 07/25/2023  END OF SESSION:  PT End of Session - 07/25/23 1736     Visit Number 3    Number of Visits 16    Date for PT Re-Evaluation 09/13/23    Authorization Type Medicare A&B; [employer] supplemental    Authorization Time Period 07/19/23-09/13/23    Authorization - Visit Number 3    Authorization - Number of Visits 16    Progress Note Due on Visit 10    PT Start Time 1730    PT Stop Time 1810    PT Time Calculation (min) 40 min    Equipment Utilized During Treatment Gait belt    Activity Tolerance Patient tolerated treatment well;No increased pain    Behavior During Therapy WFL for tasks assessed/performed            Past Medical History:  Diagnosis Date   Coronary artery disease    a. 1989 s/p MI w/ PTCA to unknown vessel. Reports cath 6 months later with occluded vessel and collaterals--> medically managed since..   Diabetes mellitus without complication (HCC)    Hyperlipidemia LDL goal <70    Hypertension    a. 02/2020 nl renal artery duplex.   MI (myocardial infarction) (HCC)    PAD (peripheral artery disease) (HCC)    a. 12/2019 ABIs: R 1.02, L 0.56-->unchanged.   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   Regular astigmatism 08/11/2022   Atherosclerosis of native arteries of extremity with intermittent claudication (HCC) 01/03/2022   Need for shingles vaccine 01/22/2019   Need for vaccination against Streptococcus pneumoniae using pneumococcal conjugate vaccine 7 01/22/2019   Encounter for  general adult medical examination with abnormal findings 01/18/2018   Abnormal liver function 01/18/2018   S/P laparoscopic cholecystectomy 01/18/2018   Bradycardia 01/18/2018   Cutaneous candidiasis 01/18/2018   Oral candidiasis 01/18/2018   Septic shock (HCC) 01/08/2018   Ascending cholangitis 01/08/2018   CAD (coronary artery disease) 01/08/2018   Sepsis (HCC) 01/08/2018   Insect bite of right lower leg 09/25/2017   Atopic dermatitis 09/25/2017   PAD (peripheral artery disease) (HCC) 11/24/2015   Type 2 diabetes mellitus with hyperglycemia, without long-term current use of insulin  (HCC) 11/24/2015   Hyperlipidemia 11/24/2015   Essential hypertension 11/24/2015   PVD (peripheral vascular disease) (HCC) 10/29/2015   DDD (degenerative disc disease), lumbar 01/30/2015   Lumbar radiculitis 01/30/2015   Osteoarthritis of spine with radiculopathy, 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   Difficulty walking 08/22/2014   History of coronary artery disease 06/14/2014   Ataxia 09/10/2013   Back pain 09/10/2013   Morbid obesity due to excess calories (HCC) 09/10/2013   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   Deficiency of other specified B group vitamins 08/28/2013   Hemorrhage of rectum and anus 05/24/2013    PCP: Jacques Mattock, PA-C REFERRING PROVIDER: Adelle Hong, FNP [facility])  REFERRING DIAG: Spinal Stenosis, imbalance, difficulty walking   Rationale for Evaluation and Treatment: Rehabilitation THERAPY DIAG:  Chronic bilateral low back pain with left-sided sciatica  Difficulty in walking,  not elsewhere classified  Unsteadiness on feet  ONSET DATE: Chronic > 10 years   SUBJECTIVE:                                                                                                                                                                                          SUBJECTIVE STATEMENT: Pt reports pain felt good follow last session all day, pretty average the following day. Sunday  was rough and he struggled to get any exercises done struggled with just standing up.    PERTINENT HISTORY:  83 year old man referred to PT by physiatry due to progressive back pain and balance and difficulty walking, patient has known lumbar spinal stenosis.  Patient is well-known to this clinic was seen in 2016 and 2021 for same issue.  Patient has remained diligent with his exercises and remaining mobile however things have gotten worse and his pain is much more limiting while being in a standing position even more so with walking.  Patient is very unsteady at current baseline but is typically able to avoid falling.  Reports 2 falls in the past year.  Patient manages his pain with Tylenol  and/or meloxicam as needed.  Patient uses a single-point cane or occasionally a 4 [employer].  Patient is unable to undergo back injections at present due to poor glucose control.  Patient reports he was recommended to have surgery for this years ago but declined at that time.  He is unsure if surgery is still a recommendation. Average pain 6 out of 10, worst pain 8 out of 10 with walking, best pain 2 out of 10 in the 7 days prior to evaluation.    PAIN:  Are you having pain? Yes 7-8/10 across low back, into Rt flank, bilat posterior hips. PRECAUTIONS: high falls risk WEIGHT BEARING RESTRICTIONS: No FALLS:  Has patient fallen in last 6 months? 2 which he reports were avoidable, related to poor decision planning in the shower, bending without support.  LIVING ENVIRONMENT: Lives with: Wife, dog Lives in: house Stairs: 7 partial steps with hand support, adequately deep, patient reports he is able to navigate these without difficulty Has following equipment at home: [facility], 1OX  OCCUPATION: Retired. PLOF: Modified independent.  PATIENT GOALS: Patient wants to walk better, stand longer, maybe even have some pain relief.  NEXT MD VISIT: Unclear however at time of evaluation most recent lumbar MRI is posted and he would  likely schedule in clinic to discuss results of imaging  OBJECTIVE:  Note: Objective measures were completed at Evaluation unless otherwise noted.  DIAGNOSTIC FINDINGS:  Lumbar MRI 06/29/2023 "CLINICAL DATA:  Lower back pain for 5 years.  EXAM: MRI LUMBAR SPINE WITHOUT CONTRAST   TECHNIQUE: Multiplanar, multisequence MR imaging of the lumbar spine was performed. No intravenous contrast was administered.   COMPARISON:  MRI lumbar spine 08/28/2014.   FINDINGS: Segmentation:  Standard.   Alignment: Lumbar lordosis is maintained. Grade 1 anterolisthesis of L3 on L4 and of L4 on L5. 4 mm retrolisthesis of L5 on S1.   Vertebrae: Prominent Modic type 1 degenerative endplate changes at L2-3 with associated discogenic edema which is new from prior. No additional bone marrow edema or evidence of fracture. Hemangioma in the L2 vertebral body.   Conus medullaris and cauda equina: Conus extends to the L1 level. Conus and cauda equina appear normal.   Paraspinal and other soft tissues: The visualized paraspinal soft tissues are unremarkable.   Disc levels:  T12-L1: No significant disc bulge. No significant spinal canal stenosis or foraminal stenosis.   L1-2: No significant disc bulge. No significant spinal canal stenosis or foraminal stenosis. Mild facet arthrosis.   L2-3: Disc desiccation and moderate disc height loss. Diffuse disc bulge eccentric to the left which indents the ventral thecal sac resulting in lateral recess narrowing. Moderate facet arthrosis with bilateral facet effusions which indent the dorsal thecal sac more pronounced on the left. Severe spinal canal stenosis noted. Moderate bilateral foraminal stenosis slightly greater on the left. Facet osteophytes likely contact the exiting left L2 nerve root.   L3-4: Mild disc height loss. Diffuse disc bulge which indents the ventral thecal sac with lateral recess narrowing. Severe facet arthrosis. Small facet effusion  on the left. Moderate to severe spinal canal stenosis at this level with significant crowding of the cauda equina nerve roots. Moderate bilateral foraminal stenosis.   L4-5: Grade 1 anterolisthesis with partial uncovering of the disc. Diffuse disc bulge with lateral recess narrowing. Severe facet arthrosis and bilateral facet effusions. Mild spinal canal stenosis. There is lateral recess narrowing with possible impingement upon the traversing nerve roots. Moderate bilateral foraminal stenosis.   L5-S1: Retrolisthesis. No significant spinal canal stenosis. Mild facet arthrosis. Moderate right and mild left foraminal stenosis.   IMPRESSION: Degenerative changes of the lumbar spine as above. Severe spinal canal stenosis at L2-3 and additional moderate to severe spinal canal stenosis at L3-4.   Disc bulge at L4-5 resulting in lateral recess narrowing with likely impingement upon the traversing nerve roots.   Moderate foraminal stenosis at multiple levels. Facet osteophytes at L2-3 likely contact the exiting left L2 nerve root.   Facet arthrosis throughout the lumbar spine with facet effusions noted at multiple levels.   Degenerative endplate changes and discogenic edema at L2-3 which may contribute to back pain.    Electronically Signed   By: Denny Flack M.D.   On: 06/29/2023 13:53"  PATIENT SURVEYS:   Will issue MODI on visit 2  SENSATION: Not assessed, will defer to physiatry notes.  PALPATION: Taut paraspinal muscles along the Rt lumbar region in particular.  LOWER EXTREMITY MMT:  will complete on visit 2 FUNCTIONAL TESTS:  5 times sit to stand: 16.70sec hands on knees, 22" surface height  Timed up and go (TUG): c 4WW 22.45sec 10 meter walk test: c SPC, 0.57m/s, ataxic without LOB  (07/21/23): 4WW 490ft 3 minutes 48sec, unable to conitnue due to leg weakness  GAIT: Distance walked: waiting area to exam room 2 c SPC, very small steps, very slow, appears unsteady,  holds back with left hand for antalgia- modI level.   BALANCE:  [facility] PT Assessment - 07/21/23  0001       Berg Balance Test   Sit to Stand Able to stand using hands after several tries    Standing Unsupported Able to stand safely 2 minutes    Sitting with Back Unsupported but Feet Supported on Floor or Stool Able to sit safely and securely 2 minutes    Stand to Sit Sits safely with minimal use of hands    Transfers Able to transfer safely, minor use of hands    Standing Unsupported with Eyes Closed Able to stand 3 seconds    Standing Unsupported with Feet Together Needs help to attain position and unable to hold for 15 seconds    From Standing, Reach Forward with Outstretched Arm Can reach forward >12 cm safely (5")    From Standing Position, Pick up Object from Floor Unable to try/needs assist to keep balance    From Standing Position, Turn to Look Behind Over each Shoulder --   will finish on visit 3            [facility] PT Assessment - 07/25/23 0001       Berg Balance Test   From Standing Position, Turn to Look Behind Over each Shoulder Needs assist to keep from losing balance and falling    Turn 360 Degrees Needs assistance while turning    Standing Unsupported, Alternately Place Feet on Step/Stool Needs assistance to keep from falling or unable to try    Standing Unsupported, One Foot in [employer] balance while stepping or standing    Standing on One Leg Unable to try or needs assist to prevent fall    Berg comment: 23/56            TREATMENT DATE 07/25/23:                                                                                                                               *one LOB AMB upon arrival -heat application to back  for NUSTEP --- -AA/ROM and cardio aerobic conditioning on Nustep: Level 1 x 5 minutes  -STS from Nustep seat 9 (22") x10 (fairly painful)  -in same place, alternate marching x20 while reclined on seat --- -AA/ROM and cardio aerobic  conditioning on Nustep: Level 1 x 5 minutes  -STS from Nustep seat 9 (22") x10 (fairly painful)  -in same place, alternate marching x20 while reclined on seat ---- BBT Completion (see above): 23/56  ---- Blue physio ball rollouts x20 forward 10x Left, Rt DC due to worse Rt pain --- AMB 236ft overground c 4WW 2lb AW, minGuard assist; cues to stop scuffing, pick those feet up. 90sec rest seated  AMB 234ft overground c 4WW 2lb AW, minGuard assist; cues to stop scuffing, pick those feet up. 90sec rest seated  -minGuardAssist AMB out of clinic c 4WW   PATIENT EDUCATION:  Education details: Role and use of 4WW, heat v ice application to back  Person educated: patient  Education method: collaborative learning, deliberate practice, positive reinforcement, explicit instruction, establish rules. Education comprehension: Acknowledges, will need reinforcement   HOME EXERCISE PROGRAM: Access Code: [email or web address] URL: [email or web address] Date: 07/19/2023 Prepared by: Atlee Blanks  Exercises - Sit to Stand with Hands on Knees  - 3 x daily - 7 x weekly - 1 sets - 10 reps -  22" seat height - Standing March with Counter Support  - 3 x daily - 7 x weekly - 1 sets - 20 reps - Side Stepping with Counter Support  - 3 x daily - 7 x weekly - 1 sets - 20 reps *walk twice daily with 4WW or shopping buggy (outside of regular ADL walking)   ASSESSMENT: CLINICAL IMPRESSION: Continue to emphasize need for greater assistive device as pt has recurrent near falls with SPC (often backward sway). Somewhat less pain today compared to previous session, however also had less pain improvement within session. BBT completing revealing of significant imbalance- suspect bilat ankle anterior compartment weakness. Began high intensity walkign intervals with superivison, fewer LOB while using 4WW. Physical therapy will help patient achieve long-term goals of care improved safety, improve quality of life and retain  the ability to be a community dwelling adult.  OBJECTIVE IMPAIRMENTS: Abnormal gait, decreased activity tolerance, decreased balance, decreased knowledge of use of DME, decreased mobility, difficulty walking, decreased ROM, decreased strength, hypomobility, increased muscle spasms, and impaired flexibility.  ACTIVITY LIMITATIONS: carrying, lifting, bending, standing, squatting, transfers, bathing, toileting, dressing, and locomotion level PARTICIPATION LIMITATIONS: meal prep, cleaning, laundry, interpersonal relationship, shopping, community activity, and yard work PERSONAL FACTORS: Age, Past/current experiences, and Time since onset of injury/illness/exacerbation are also affecting patient's functional outcome.  REHAB POTENTIAL: Good CLINICAL PRESENTATION: Stable/uncomplicated EVALUATION COMPLEXITY: Moderate  GOALS: Goals reviewed with patient? Yes  SHORT TERM GOALS: Target date: 08/18/23 TUG improvement by >5sec Baseline: 07/19/23: 22.45sec c 4WW Goal status: INITIAL 2.  5xSTS improved in seat height by >2 inches.  Baseline: 07/19/23: 22" required for hands free testing 16.7sec Goal status: INITIAL 3.  increase >114ft Baseline: 480ft on 07/21/23 c 4WW;  Goal status: advancing  LONG TERM GOALS: Target date: 10/19/23 Patient will demonstrate with LRAD greater than 1200 feet to demonstrate safe ability to perform limited community distance walking Baseline: 48ft on 07/21/23 c 4WW; Goal status: Initial 2.  Patient will perform 5 times STS less than 15s hands-free from standard seat height. Baseline: Requires 22 inch seat height Goal status: INITIAL 3.  Patient to demonstrate TUG test less than 10 seconds device ad lib. to indicate reduced falls risk.  Baseline: 22.45 seconds with rollator Goal status: INITIAL 4.  Patient to score greater than 10 point improvement on BBT to indicate improved balance performance Baseline: commenced visit 2, will complete visit 3  Goal status:  INITIAL  PLAN: PT FREQUENCY: 1-2x/week PT DURATION: 12 weeks PLANNED INTERVENTIONS: 97110-Therapeutic exercises, 97530- Therapeutic activity, 97112- Neuromuscular re-education, 97535- Self Care, 54098- Manual therapy, [id number]- Gait training, Balance training, Stair training, Joint mobilization, Joint manipulation, Spinal manipulation, Spinal mobilization, Scar mobilization, and Vestibular training  PLAN FOR NEXT SESSION: continue with Nustep intervals, STS, marching, rollouts, ankle strength; minGuard assist  Meleane Selinger C, PT 07/25/2023, 5:38 PM   5:38 PM, 07/25/23 Dawn Eth, PT, DPT Physical Therapist - [place] [phone] (Office)

## 2023-07-27 ENCOUNTER — Ambulatory Visit

## 2023-07-27 DIAGNOSIS — R2681 Unsteadiness on feet: Secondary | ICD-10-CM | POA: Diagnosis not present

## 2023-07-27 DIAGNOSIS — G8929 Other chronic pain: Secondary | ICD-10-CM

## 2023-07-27 DIAGNOSIS — M5442 Lumbago with sciatica, left side: Secondary | ICD-10-CM | POA: Diagnosis not present

## 2023-07-27 DIAGNOSIS — R262 Difficulty in walking, not elsewhere classified: Secondary | ICD-10-CM | POA: Diagnosis not present

## 2023-07-27 NOTE — Therapy (Signed)
 OUTPATIENT PHYSICAL THERAPY TREATMENT  Patient Name: Allen [name] MRN: [id number] DOB:1940/09/26, 83 y.o., male Today's Date: 07/27/2023  END OF SESSION:  PT End of Session - 07/27/23 0824     Visit Number 4    Number of Visits 16    Date for PT Re-Evaluation 09/13/23    Authorization Type Medicare A&B; [employer] supplemental    Authorization Time Period 07/19/23-09/13/23    Authorization - Visit Number 4    Authorization - Number of Visits 16    Progress Note Due on Visit 10    PT Start Time 0817    PT Stop Time 0857    PT Time Calculation (min) 40 min    Equipment Utilized During Treatment Gait belt    Activity Tolerance Patient tolerated treatment well;No increased pain    Behavior During Therapy WFL for tasks assessed/performed            Past Medical History:  Diagnosis Date   Coronary artery disease    a. 1989 s/p MI w/ PTCA to unknown vessel. Reports cath 6 months later with occluded vessel and collaterals--> medically managed since..   Diabetes mellitus without complication (HCC)    Hyperlipidemia LDL goal <70    Hypertension    a. 02/2020 nl renal artery duplex.   MI (myocardial infarction) (HCC)    PAD (peripheral artery disease) (HCC)    a. 12/2019 ABIs: R 1.02, L 0.56-->unchanged.   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   Regular astigmatism 08/11/2022   Atherosclerosis of native arteries of extremity with intermittent claudication (HCC) 01/03/2022   Need for shingles vaccine 01/22/2019   Need for vaccination against Streptococcus pneumoniae using pneumococcal conjugate vaccine 7 01/22/2019   Encounter  for general adult medical examination with abnormal findings 01/18/2018   Abnormal liver function 01/18/2018   S/P laparoscopic cholecystectomy 01/18/2018   Bradycardia 01/18/2018   Cutaneous candidiasis 01/18/2018   Oral candidiasis 01/18/2018   Septic shock (HCC) 01/08/2018   Ascending cholangitis 01/08/2018   CAD (coronary artery disease) 01/08/2018   Sepsis (HCC) 01/08/2018   Insect bite of right lower leg 09/25/2017   Atopic dermatitis 09/25/2017   PAD (peripheral artery disease) (HCC) 11/24/2015   Type 2 diabetes mellitus with hyperglycemia, without long-term current use of insulin  (HCC) 11/24/2015   Hyperlipidemia 11/24/2015   Essential hypertension 11/24/2015   PVD (peripheral vascular disease) (HCC) 10/29/2015   DDD (degenerative disc disease), lumbar 01/30/2015   Lumbar radiculitis 01/30/2015   Osteoarthritis of spine with radiculopathy, 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   Difficulty walking 08/22/2014   History of coronary artery disease 06/14/2014   Ataxia 09/10/2013   Back pain 09/10/2013   Morbid obesity due to excess calories (HCC) 09/10/2013   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   Deficiency of other specified B group vitamins 08/28/2013   Hemorrhage of rectum and anus 05/24/2013    PCP: Jacques Mattock, PA-C REFERRING PROVIDER: Adelle Hong, FNP [facility])  REFERRING DIAG: Spinal Stenosis, imbalance, difficulty walking   Rationale for Evaluation and Treatment: Rehabilitation THERAPY DIAG:  Chronic bilateral low back pain with left-sided sciatica  Difficulty in walking,  not elsewhere classified  Unsteadiness on feet  ONSET DATE: Chronic > 10 years   SUBJECTIVE:                                                                                                                                                                                          SUBJECTIVE STATEMENT: Pain very good today and yesterday. Pt taking his wife to dentist later today. Pt arrives  with RW as requested.     PERTINENT HISTORY:  83 year old man referred to PT by physiatry due to progressive back pain and balance and difficulty walking, patient has known lumbar spinal stenosis.  Patient is well-known to this clinic was seen in 2016 and 2021 for same issue.  Patient has remained diligent with his exercises and remaining mobile however things have gotten worse and his pain is much more limiting while being in a standing position even more so with walking.  Patient is very unsteady at current baseline but is typically able to avoid falling.  Reports 2 falls in the past year.  Patient manages his pain with Tylenol  and/or meloxicam as needed.  Patient uses a single-point cane or occasionally a 4 [employer].  Patient is unable to undergo back injections at present due to poor glucose control.  Patient reports he was recommended to have surgery for this years ago but declined at that time.  He is unsure if surgery is still a recommendation. Average pain 6 out of 10, worst pain 8 out of 10 with walking, best pain 2 out of 10 in the 7 days prior to evaluation.    PAIN:  Are you having pain? Yes 3-4/10 across low back, into Rt flank, bilat posterior hips. PRECAUTIONS: high falls risk WEIGHT BEARING RESTRICTIONS: No FALLS:  Has patient fallen in last 6 months? 2 which he reports were avoidable, related to poor decision planning in the shower, bending without support.  LIVING ENVIRONMENT: Lives with: Wife, dog Lives in: house Stairs: 7 partial steps with hand support, adequately deep, patient reports he is able to navigate these without difficulty Has following equipment at home: [facility], 1LK  OCCUPATION: Retired. PLOF: Modified independent.  PATIENT GOALS: Patient wants to walk better, stand longer, maybe even have some pain relief.  NEXT MD VISIT: Unclear however at time of evaluation most recent lumbar MRI is posted and he would likely schedule in clinic to discuss results of  imaging  OBJECTIVE:  Note: Objective measures were completed at Evaluation unless otherwise noted.  DIAGNOSTIC FINDINGS:  Lumbar MRI 06/29/2023 CLINICAL DATA:  Lower back pain for 5 years.   EXAM: MRI LUMBAR SPINE WITHOUT CONTRAST  TECHNIQUE: Multiplanar, multisequence MR imaging of the lumbar spine was performed. No intravenous contrast was administered.   COMPARISON:  MRI lumbar spine 08/28/2014.   FINDINGS: Segmentation:  Standard.   Alignment: Lumbar lordosis is maintained. Grade 1 anterolisthesis of L3 on L4 and of L4 on L5. 4 mm retrolisthesis of L5 on S1.   Vertebrae: Prominent Modic type 1 degenerative endplate changes at L2-3 with associated discogenic edema which is new from prior. No additional bone marrow edema or evidence of fracture. Hemangioma in the L2 vertebral body.   Conus medullaris and cauda equina: Conus extends to the L1 level. Conus and cauda equina appear normal.   Paraspinal and other soft tissues: The visualized paraspinal soft tissues are unremarkable.   Disc levels:  T12-L1: No significant disc bulge. No significant spinal canal stenosis or foraminal stenosis.   L1-2: No significant disc bulge. No significant spinal canal stenosis or foraminal stenosis. Mild facet arthrosis.   L2-3: Disc desiccation and moderate disc height loss. Diffuse disc bulge eccentric to the left which indents the ventral thecal sac resulting in lateral recess narrowing. Moderate facet arthrosis with bilateral facet effusions which indent the dorsal thecal sac more pronounced on the left. Severe spinal canal stenosis noted. Moderate bilateral foraminal stenosis slightly greater on the left. Facet osteophytes likely contact the exiting left L2 nerve root.   L3-4: Mild disc height loss. Diffuse disc bulge which indents the ventral thecal sac with lateral recess narrowing. Severe facet arthrosis. Small facet effusion on the left. Moderate to severe spinal canal  stenosis at this level with significant crowding of the cauda equina nerve roots. Moderate bilateral foraminal stenosis.   L4-5: Grade 1 anterolisthesis with partial uncovering of the disc. Diffuse disc bulge with lateral recess narrowing. Severe facet arthrosis and bilateral facet effusions. Mild spinal canal stenosis. There is lateral recess narrowing with possible impingement upon the traversing nerve roots. Moderate bilateral foraminal stenosis.   L5-S1: Retrolisthesis. No significant spinal canal stenosis. Mild facet arthrosis. Moderate right and mild left foraminal stenosis.   IMPRESSION: Degenerative changes of the lumbar spine as above. Severe spinal canal stenosis at L2-3 and additional moderate to severe spinal canal stenosis at L3-4.   Disc bulge at L4-5 resulting in lateral recess narrowing with likely impingement upon the traversing nerve roots.   Moderate foraminal stenosis at multiple levels. Facet osteophytes at L2-3 likely contact the exiting left L2 nerve root.   Facet arthrosis throughout the lumbar spine with facet effusions noted at multiple levels.   Degenerative endplate changes and discogenic edema at L2-3 which may contribute to back pain.    Electronically Signed   By: Denny Flack M.D.   On: 06/29/2023 13:53  PATIENT SURVEYS:    MODI: 50% (07/27/23)  SENSATION: Not assessed, will defer to physiatry notes.  PALPATION: Taut paraspinal muscles along the Rt lumbar region in particular.  LOWER EXTREMITY MMT:  will complete on visit 2 FUNCTIONAL TESTS:  5 times sit to stand: 16.70sec hands on knees, 22 surface height  Timed up and go (TUG): c 4WW 22.45sec 10 meter walk test: c SPC, 0.76m/s, ataxic without LOB  (07/21/23): 4WW 465ft 3 minutes 48sec, unable to conitnue due to leg weakness  GAIT: Distance walked: waiting area to exam room 2 c SPC, very small steps, very slow, appears unsteady, holds back with left hand for antalgia- modI level.    BALANCE:  [facility] PT Assessment - 07/21/23 0001       Berg Balance Test  Sit to Stand Able to stand using hands after several tries    Standing Unsupported Able to stand safely 2 minutes    Sitting with Back Unsupported but Feet Supported on Floor or Stool Able to sit safely and securely 2 minutes    Stand to Sit Sits safely with minimal use of hands    Transfers Able to transfer safely, minor use of hands    Standing Unsupported with Eyes Closed Able to stand 3 seconds    Standing Unsupported with Feet Together Needs help to attain position and unable to hold for 15 seconds    From Standing, Reach Forward with Outstretched Arm Can reach forward >12 cm safely (5)    From Standing Position, Pick up Object from Floor Unable to try/needs assist to keep balance    From Standing Position, Turn to Look Behind Over each Shoulder --   will finish on visit 3      [facility] PT Assessment - 07/25/23 0001       Berg Balance Test   From Standing Position, Turn to Look Behind Over each Shoulder Needs assist to keep from losing balance and falling    Turn 360 Degrees Needs assistance while turning    Standing Unsupported, Alternately Place Feet on Step/Stool Needs assistance to keep from falling or unable to try    Standing Unsupported, One Foot in [employer] balance while stepping or standing    Standing on One Leg Unable to try or needs assist to prevent fall    Berg comment: 23/56         TREATMENT DATE 07/27/23:                                                                                                                               *pt arrives with RW for first time as requested by author  -heat application to back  for NUSTEP --- -AA/ROM and cardio aerobic conditioning on Nustep, seat 9, arms 9: Level 1 x 5 minutes (first day meeting >70spm goal)   -STS from Nustep seat 9 (22) x10, hands free, cues for flop avoidance  -standing marching at support bar x30 alternating (transitioned from  seated version)  --- --AA/ROM and cardio aerobic conditioning on Nustep, seat 9, arms 9: Level 1 x 5 minutes  -STS from Nustep seat 9 (22) x10 -in same place, alternate marching x20 while reclined on seat ---- MODI completion (50%)  -STS from Nustep seat 9 (22) x10  -- AMB 240ft overground c 4WW 2lb AW, minGuard assist; cues to stop scuffing, pick those feet up. 2 miin 2sec, 60sec rest seated;  AMB 240ft overground c 4WW 2lb AW, minGuard assist; cues to stop scuffing, pick those feet up. 1 miin 55sec, 60sec rest seated;  -standing static balance x3 minutes c minGuard ot minA   -minGuardAssist AMB out of clinic c 4WW   PATIENT EDUCATION:  Education details: Role and use of 4WW,  heat v ice application to back  Person educated: patient  Education method: collaborative learning, deliberate practice, positive reinforcement, explicit instruction, establish rules. Education comprehension: Acknowledges, will need reinforcement   HOME EXERCISE PROGRAM: Access Code: [email or web address] URL: [email or web address] Date: 07/19/2023 Prepared by: Atlee Blanks  Exercises - Sit to Stand with Hands on Knees  - 3 x daily - 7 x weekly - 1 sets - 10 reps -  22 seat height - Standing March with Counter Support  - 3 x daily - 7 x weekly - 1 sets - 20 reps - Side Stepping with Counter Support  - 3 x daily - 7 x weekly - 1 sets - 20 reps *walk twice daily with 4WW or shopping buggy (outside of regular ADL walking)   ASSESSMENT: CLINICAL IMPRESSION: Pain improved today. Performance improved on Nustep and overground walking but, STS more weak in appearance. Balance remains fairly limited, but now using RW. Physical therapy will help patient achieve long-term goals of care improved safety, improve quality of life and retain the ability to be a community dwelling adult.  OBJECTIVE IMPAIRMENTS: Abnormal gait, decreased activity tolerance, decreased balance, decreased knowledge of use of DME,  decreased mobility, difficulty walking, decreased ROM, decreased strength, hypomobility, increased muscle spasms, and impaired flexibility.  ACTIVITY LIMITATIONS: carrying, lifting, bending, standing, squatting, transfers, bathing, toileting, dressing, and locomotion level PARTICIPATION LIMITATIONS: meal prep, cleaning, laundry, interpersonal relationship, shopping, community activity, and yard work PERSONAL FACTORS: Age, Past/current experiences, and Time since onset of injury/illness/exacerbation are also affecting patient's functional outcome.  REHAB POTENTIAL: Good CLINICAL PRESENTATION: Stable/uncomplicated EVALUATION COMPLEXITY: Moderate  GOALS: Goals reviewed with patient? Yes  SHORT TERM GOALS: Target date: 08/18/23 TUG improvement by >5sec Baseline: 07/19/23: 22.45sec c 4WW Goal status: INITIAL 2.  5xSTS improved in seat height by >2 inches.  Baseline: 07/19/23: 22 required for hands free testing 16.7sec Goal status: INITIAL 3.  increase >170ft Baseline: 48ft on 07/21/23 c 4WW;  Goal status: advancing  LONG TERM GOALS: Target date: 10/19/23 Patient will demonstrate with LRAD greater than 1200 feet to demonstrate safe ability to perform limited community distance walking Baseline: 458ft on 07/21/23 c 4WW; Goal status: Initial 2.  Patient will perform 5 times STS less than 15s hands-free from standard seat height. Baseline: Requires 22 inch seat height Goal status: INITIAL 3.  Patient to demonstrate TUG test less than 10 seconds device ad lib. to indicate reduced falls risk.  Baseline: 22.45 seconds with rollator Goal status: INITIAL 4.  Patient to score greater than 10 point improvement on BBT to indicate improved balance performance Baseline: commenced visit 2, will complete visit 3  Goal status: INITIAL  PLAN: PT FREQUENCY: 1-2x/week PT DURATION: 12 weeks PLANNED INTERVENTIONS: 97110-Therapeutic exercises, 97530- Therapeutic activity, 97112- Neuromuscular  re-education, 97535- Self Care, 54098- Manual therapy, [phone]- Gait training, Balance training, Stair training, Joint mobilization, Joint manipulation, Spinal manipulation, Spinal mobilization, Scar mobilization, and Vestibular training  PLAN FOR NEXT SESSION: add in more balance interventions, continue with Nustep intervals, STS, marching, rollouts, ankle strength; minGuard assist  Lajoyce Tamura C, PT 07/27/2023, 8:25 AM   8:25 AM, 07/27/23 Dawn Eth, PT, DPT Physical Therapist - [place] [phone] (Office)

## 2023-08-01 ENCOUNTER — Ambulatory Visit (INDEPENDENT_AMBULATORY_CARE_PROVIDER_SITE_OTHER)

## 2023-08-01 DIAGNOSIS — R55 Syncope and collapse: Secondary | ICD-10-CM

## 2023-08-03 ENCOUNTER — Ambulatory Visit: Payer: Self-pay | Admitting: Cardiology

## 2023-08-03 ENCOUNTER — Ambulatory Visit: Admitting: Physical Therapy

## 2023-08-03 LAB — CUP PACEART REMOTE DEVICE CHECK
Date Time Interrogation Session: 20250618003800
Pulse Gen Serial Number: 109944

## 2023-08-04 ENCOUNTER — Encounter

## 2023-08-08 ENCOUNTER — Ambulatory Visit: Admitting: Physical Therapy

## 2023-08-08 DIAGNOSIS — M5442 Lumbago with sciatica, left side: Secondary | ICD-10-CM | POA: Diagnosis not present

## 2023-08-08 DIAGNOSIS — G8929 Other chronic pain: Secondary | ICD-10-CM | POA: Diagnosis not present

## 2023-08-08 DIAGNOSIS — R262 Difficulty in walking, not elsewhere classified: Secondary | ICD-10-CM

## 2023-08-08 DIAGNOSIS — R2681 Unsteadiness on feet: Secondary | ICD-10-CM | POA: Diagnosis not present

## 2023-08-08 NOTE — Therapy (Unsigned)
 OUTPATIENT PHYSICAL THERAPY TREATMENT  Patient Name: Allen [name] MRN: [id number] DOB:08/11/1940, 83 y.o., male Today's Date: 08/08/2023  END OF SESSION:  PT End of Session - 08/08/23 1826     Visit Number 5    Number of Visits 16    Date for PT Re-Evaluation 09/13/23    Authorization Type Medicare A&B; [employer] supplemental    Authorization Time Period 07/19/23-09/13/23    Authorization - Visit Number 5    Authorization - Number of Visits 16    Progress Note Due on Visit 10    PT Start Time 1820    PT Stop Time 1900    PT Time Calculation (min) 40 min    Equipment Utilized During Treatment Gait belt    Activity Tolerance Patient tolerated treatment well;No increased pain    Behavior During Therapy WFL for tasks assessed/performed         Past Medical History:  Diagnosis Date   Coronary artery disease    a. 1989 s/p MI w/ PTCA to unknown vessel. Reports cath 6 months later with occluded vessel and collaterals--> medically managed since..   Diabetes mellitus without complication (HCC)    Hyperlipidemia LDL goal <70    Hypertension    a. 02/2020 nl renal artery duplex.   MI (myocardial infarction) (HCC)    PAD (peripheral artery disease) (HCC)    a. 12/2019 ABIs: R 1.02, L 0.56-->unchanged.   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   Regular astigmatism 08/11/2022   Atherosclerosis of native arteries of extremity with intermittent claudication (HCC) 01/03/2022   Need for shingles vaccine 01/22/2019   Need for vaccination against Streptococcus pneumoniae using pneumococcal conjugate vaccine 7 01/22/2019   Encounter for  general adult medical examination with abnormal findings 01/18/2018   Abnormal liver function 01/18/2018   S/P laparoscopic cholecystectomy 01/18/2018   Bradycardia 01/18/2018   Cutaneous candidiasis 01/18/2018   Oral candidiasis 01/18/2018   Septic shock (HCC) 01/08/2018   Ascending cholangitis 01/08/2018   CAD (coronary artery disease) 01/08/2018   Sepsis (HCC) 01/08/2018   Insect bite of right lower leg 09/25/2017   Atopic dermatitis 09/25/2017   PAD (peripheral artery disease) (HCC) 11/24/2015   Type 2 diabetes mellitus with hyperglycemia, without long-term current use of insulin  (HCC) 11/24/2015   Hyperlipidemia 11/24/2015   Essential hypertension 11/24/2015   PVD (peripheral vascular disease) (HCC) 10/29/2015   DDD (degenerative disc disease), lumbar 01/30/2015   Lumbar radiculitis 01/30/2015   Osteoarthritis of spine with radiculopathy, 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   Difficulty walking 08/22/2014   History of coronary artery disease 06/14/2014   Ataxia 09/10/2013   Back pain 09/10/2013   Morbid obesity due to excess calories (HCC) 09/10/2013   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   Deficiency of other specified B group vitamins 08/28/2013   Hemorrhage of rectum and anus 05/24/2013    PCP: Kristina Tinnie POUR, PA-C REFERRING PROVIDER: Benton Dowse, FNP [facility])  REFERRING DIAG: Spinal Stenosis, imbalance, difficulty walking   Rationale for Evaluation and Treatment: Rehabilitation THERAPY DIAG:  Chronic bilateral low back pain with left-sided sciatica  Difficulty in walking, not elsewhere classified  ONSET DATE: Chronic > 10 years   SUBJECTIVE:                                                                                                                                                                                          SUBJECTIVE STATEMENT: Pt reports that his pain has been getting worse since starting physical therapy. He states that he has been able to do  all the exercises with the exception of sit to stands which causes him increased low back pain. He has not had any falls since last time.   PERTINENT HISTORY:  83 year old man referred to PT by physiatry due to progressive back pain and balance and difficulty walking, patient has known lumbar spinal stenosis.  Patient is well-known to this clinic was seen in 2016 and 2021 for same issue.  Patient has remained diligent with his exercises and remaining mobile however things have gotten worse and his pain is much more limiting while being in a standing position even more so with walking.  Patient is very unsteady at current baseline but is typically able to avoid falling.  Reports 2 falls in the past year.  Patient manages his pain with Tylenol  and/or meloxicam as needed.  Patient uses a single-point cane or occasionally a 4 [employer].  Patient is unable to undergo back injections at present due to poor glucose control.  Patient reports he was recommended to have surgery for this years ago but declined at that time.  He is unsure if surgery is still a recommendation. Average pain 6 out of 10, worst pain 8 out of 10 with walking, best pain 2 out of 10 in the 7 days prior to evaluation.    PAIN:  Are you having pain? Yes 3-4/10 across low back, into Rt flank, bilat posterior hips. PRECAUTIONS: high falls risk WEIGHT BEARING RESTRICTIONS: No FALLS:  Has patient fallen in last 6 months? 2 which he reports were avoidable, related to poor decision planning in the shower, bending without support.  LIVING ENVIRONMENT: Lives with: Wife, dog Lives in: house Stairs: 7 partial steps with hand support, adequately deep, patient reports he is able to navigate these without difficulty Has following equipment at home: [facility], 5TT  OCCUPATION: Retired. PLOF: Modified independent.  PATIENT GOALS: Patient wants to walk better, stand longer, maybe even have some pain relief.  NEXT MD VISIT: Unclear however at time of  evaluation most recent lumbar MRI is posted and he would likely schedule in clinic to discuss results of imaging  OBJECTIVE:  Note: Objective measures were completed at Evaluation unless otherwise noted.  DIAGNOSTIC FINDINGS:  Lumbar MRI 06/29/2023 CLINICAL  DATA:  Lower back pain for 5 years.   EXAM: MRI LUMBAR SPINE WITHOUT CONTRAST   TECHNIQUE: Multiplanar, multisequence MR imaging of the lumbar spine was performed. No intravenous contrast was administered.   COMPARISON:  MRI lumbar spine 08/28/2014.   FINDINGS: Segmentation:  Standard.   Alignment: Lumbar lordosis is maintained. Grade 1 anterolisthesis of L3 on L4 and of L4 on L5. 4 mm retrolisthesis of L5 on S1.   Vertebrae: Prominent Modic type 1 degenerative endplate changes at L2-3 with associated discogenic edema which is new from prior. No additional bone marrow edema or evidence of fracture. Hemangioma in the L2 vertebral body.   Conus medullaris and cauda equina: Conus extends to the L1 level. Conus and cauda equina appear normal.   Paraspinal and other soft tissues: The visualized paraspinal soft tissues are unremarkable.   Disc levels:  T12-L1: No significant disc bulge. No significant spinal canal stenosis or foraminal stenosis.   L1-2: No significant disc bulge. No significant spinal canal stenosis or foraminal stenosis. Mild facet arthrosis.   L2-3: Disc desiccation and moderate disc height loss. Diffuse disc bulge eccentric to the left which indents the ventral thecal sac resulting in lateral recess narrowing. Moderate facet arthrosis with bilateral facet effusions which indent the dorsal thecal sac more pronounced on the left. Severe spinal canal stenosis noted. Moderate bilateral foraminal stenosis slightly greater on the left. Facet osteophytes likely contact the exiting left L2 nerve root.   L3-4: Mild disc height loss. Diffuse disc bulge which indents the ventral thecal sac with lateral recess  narrowing. Severe facet arthrosis. Small facet effusion on the left. Moderate to severe spinal canal stenosis at this level with significant crowding of the cauda equina nerve roots. Moderate bilateral foraminal stenosis.   L4-5: Grade 1 anterolisthesis with partial uncovering of the disc. Diffuse disc bulge with lateral recess narrowing. Severe facet arthrosis and bilateral facet effusions. Mild spinal canal stenosis. There is lateral recess narrowing with possible impingement upon the traversing nerve roots. Moderate bilateral foraminal stenosis.   L5-S1: Retrolisthesis. No significant spinal canal stenosis. Mild facet arthrosis. Moderate right and mild left foraminal stenosis.   IMPRESSION: Degenerative changes of the lumbar spine as above. Severe spinal canal stenosis at L2-3 and additional moderate to severe spinal canal stenosis at L3-4.   Disc bulge at L4-5 resulting in lateral recess narrowing with likely impingement upon the traversing nerve roots.   Moderate foraminal stenosis at multiple levels. Facet osteophytes at L2-3 likely contact the exiting left L2 nerve root.   Facet arthrosis throughout the lumbar spine with facet effusions noted at multiple levels.   Degenerative endplate changes and discogenic edema at L2-3 which may contribute to back pain.    Electronically Signed   By: Donnice Mania M.D.   On: 06/29/2023 13:53  PATIENT SURVEYS:    MODI: 50% (07/27/23)  SENSATION: Not assessed, will defer to physiatry notes.  PALPATION: Taut paraspinal muscles along the Rt lumbar region in particular.  LOWER EXTREMITY MMT:  will complete on visit 2 FUNCTIONAL TESTS:  5 times sit to stand: 16.70sec hands on knees, 22 surface height  Timed up and go (TUG): c 4WW 22.45sec 10 meter walk test: c SPC, 0.72m/s, ataxic without LOB  (07/21/23): 4WW 450ft 3 minutes 48sec, unable to conitnue due to leg weakness  GAIT: Distance walked: waiting area to exam room 2 c SPC,  very small steps, very slow, appears unsteady, holds back with left hand for antalgia- modI level.  BALANCE:  [facility] PT Assessment - 07/21/23 0001       Berg Balance Test   Sit to Stand Able to stand using hands after several tries    Standing Unsupported Able to stand safely 2 minutes    Sitting with Back Unsupported but Feet Supported on Floor or Stool Able to sit safely and securely 2 minutes    Stand to Sit Sits safely with minimal use of hands    Transfers Able to transfer safely, minor use of hands    Standing Unsupported with Eyes Closed Able to stand 3 seconds    Standing Unsupported with Feet Together Needs help to attain position and unable to hold for 15 seconds    From Standing, Reach Forward with Outstretched Arm Can reach forward >12 cm safely (5)    From Standing Position, Pick up Object from Floor Unable to try/needs assist to keep balance    From Standing Position, Turn to Look Behind Over each Shoulder --   will finish on visit 3      [facility] PT Assessment - 07/25/23 0001       Berg Balance Test   From Standing Position, Turn to Look Behind Over each Shoulder Needs assist to keep from losing balance and falling    Turn 360 Degrees Needs assistance while turning    Standing Unsupported, Alternately Place Feet on Step/Stool Needs assistance to keep from falling or unable to try    Standing Unsupported, One Foot in [employer] balance while stepping or standing    Standing on One Leg Unable to try or needs assist to prevent fall    Berg comment: 23/56         TREATMENT DATE   08/08/23:           THEREX   Nu-Step with seat at 7 for 5 min  Quadruped Hip Extension 2 x 10  -min VC to keep knee flexed   -Pt reports increased pain in shoulders   Standing Hip Extension 3 x 10  -Pt reports no increase in pain   -Unable to increase resistance due to risk of skin tearing or being injured or if using theraband     PATIENT EDUCATION:  Education details: Role and use of  4WW, heat v ice application to back  Person educated: patient  Education method: collaborative learning, deliberate practice, positive reinforcement, explicit instruction, establish rules. Education comprehension: Acknowledges, will need reinforcement   HOME EXERCISE PROGRAM: Access Code: [email or web address] URL: [email or web address] Date: 07/19/2023 Prepared by: Peggye Linear  Exercises - Sit to Stand with Hands on Knees  - 3 x daily - 7 x weekly - 1 sets - 10 reps -  22 seat height - Standing March with Counter Support  - 3 x daily - 7 x weekly - 1 sets - 20 reps - Side Stepping with Counter Support  - 3 x daily - 7 x weekly - 1 sets - 20 reps *walk twice daily with 4WW or shopping buggy (outside of regular ADL walking)   ASSESSMENT: CLINICAL IMPRESSION: Pt shows improvement with pain tolerance with modification to LE strengthening exercises to include flexion based exercises instead of sit to stand. Unable to properly load pt during session due to skin tearing, but PT will follow up next session when pt is wearing pants to protect legs. PT encouraged pt to utilize FWW as much as possible to decrease stenosis from limiting him and causing him to feel off balance.   Physical therapy  will continue to help patient achieve long-term goals of care improved safety, improve quality of life and retain the ability to be a community dwelling adult.  OBJECTIVE IMPAIRMENTS: Abnormal gait, decreased activity tolerance, decreased balance, decreased knowledge of use of DME, decreased mobility, difficulty walking, decreased ROM, decreased strength, hypomobility, increased muscle spasms, and impaired flexibility.  ACTIVITY LIMITATIONS: carrying, lifting, bending, standing, squatting, transfers, bathing, toileting, dressing, and locomotion level PARTICIPATION LIMITATIONS: meal prep, cleaning, laundry, interpersonal relationship, shopping, community activity, and yard work PERSONAL FACTORS: Age,  Past/current experiences, and Time since onset of injury/illness/exacerbation are also affecting patient's functional outcome.  REHAB POTENTIAL: Good CLINICAL PRESENTATION: Stable/uncomplicated EVALUATION COMPLEXITY: Moderate  GOALS: Goals reviewed with patient? Yes  SHORT TERM GOALS: Target date: 08/18/23 TUG improvement by >5sec Baseline: 07/19/23: 22.45sec c 4WW Goal status: ONGOING    2.  5xSTS improved in seat height by >2 inches.  Baseline: 07/19/23: 22 required for hands free testing 16.7sec Goal status: ONGOING    3.  increase >110ft Baseline: 450ft on 07/21/23 c 4WW;  Goal status: advancing  LONG TERM GOALS: Target date: 10/19/23 Patient will demonstrate with LRAD greater than 1200 feet to demonstrate safe ability to perform limited community distance walking Baseline: 429ft on 07/21/23 c 4WW; Goal status: Initial  2.  Patient will perform 5 times STS less than 15s hands-free from standard seat height. Baseline: Requires 22 inch seat height Goal status: ONGOING   3.  Patient to demonstrate TUG test less than 10 seconds device ad lib. to indicate reduced falls risk.  Baseline: 22.45 seconds with rollator Goal status: ONGOING    4.  Patient to score greater than 10 point improvement on BBT to indicate improved balance performance Baseline: 23/56  Goal status: ONGOING   PLAN: PT FREQUENCY: 1-2x/week PT DURATION: 12 weeks PLANNED INTERVENTIONS: 97110-Therapeutic exercises, 97530- Therapeutic activity, 97112- Neuromuscular re-education, 97535- Self Care, [id number]- Manual therapy, [id number]- Gait training, Balance training, Stair training, Joint mobilization, Joint manipulation, Spinal manipulation, Spinal mobilization, Scar mobilization, and Vestibular training  PLAN FOR NEXT SESSION: Continue with flexion based exercises: Plantigrade hip extension with added resistance with theraband around hips and over non-exposed skin. Seated balance exercises.   Toribio Servant PT, DPT   [facility] [address], [place], [address] Phone: [phone]   Fax:  [phone]

## 2023-08-09 NOTE — Progress Notes (Signed)
 Carelink Summary Report / Loop Recorder

## 2023-08-10 ENCOUNTER — Ambulatory Visit: Admitting: Physical Therapy

## 2023-08-10 DIAGNOSIS — R2681 Unsteadiness on feet: Secondary | ICD-10-CM | POA: Diagnosis not present

## 2023-08-10 DIAGNOSIS — R262 Difficulty in walking, not elsewhere classified: Secondary | ICD-10-CM | POA: Diagnosis not present

## 2023-08-10 DIAGNOSIS — G8929 Other chronic pain: Secondary | ICD-10-CM | POA: Diagnosis not present

## 2023-08-10 DIAGNOSIS — M5442 Lumbago with sciatica, left side: Secondary | ICD-10-CM | POA: Diagnosis not present

## 2023-08-10 NOTE — Therapy (Signed)
 OUTPATIENT PHYSICAL THERAPY TREATMENT  Patient Name: Allen [name] MRN: [id number] DOB:30-Jun-1940, 83 y.o., male Today's Date: 08/10/2023  END OF SESSION:  PT End of Session - 08/10/23 0912     Visit Number 6    Number of Visits 16    Date for PT Re-Evaluation 09/13/23    Authorization Type Medicare A&B; [employer] supplemental    Authorization Time Period 07/19/23-09/13/23    Authorization - Visit Number 6    Authorization - Number of Visits 16    Progress Note Due on Visit 10    PT Start Time 0905    PT Stop Time 0945    PT Time Calculation (min) 40 min    Equipment Utilized During Treatment Gait belt    Activity Tolerance Patient tolerated treatment well;No increased pain    Behavior During Therapy WFL for tasks assessed/performed         Past Medical History:  Diagnosis Date   Coronary artery disease    a. 1989 s/p MI w/ PTCA to unknown vessel. Reports cath 6 months later with occluded vessel and collaterals--> medically managed since..   Diabetes mellitus without complication (HCC)    Hyperlipidemia LDL goal <70    Hypertension    a. 02/2020 nl renal artery duplex.   MI (myocardial infarction) (HCC)    PAD (peripheral artery disease) (HCC)    a. 12/2019 ABIs: R 1.02, L 0.56-->unchanged.   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   Regular astigmatism 08/11/2022   Atherosclerosis of native arteries of extremity with intermittent claudication (HCC) 01/03/2022   Need for shingles vaccine 01/22/2019   Need for vaccination against Streptococcus pneumoniae using pneumococcal conjugate vaccine 7 01/22/2019   Encounter for  general adult medical examination with abnormal findings 01/18/2018   Abnormal liver function 01/18/2018   S/P laparoscopic cholecystectomy 01/18/2018   Bradycardia 01/18/2018   Cutaneous candidiasis 01/18/2018   Oral candidiasis 01/18/2018   Septic shock (HCC) 01/08/2018   Ascending cholangitis 01/08/2018   CAD (coronary artery disease) 01/08/2018   Sepsis (HCC) 01/08/2018   Insect bite of right lower leg 09/25/2017   Atopic dermatitis 09/25/2017   PAD (peripheral artery disease) (HCC) 11/24/2015   Type 2 diabetes mellitus with hyperglycemia, without long-term current use of insulin  (HCC) 11/24/2015   Hyperlipidemia 11/24/2015   Essential hypertension 11/24/2015   PVD (peripheral vascular disease) (HCC) 10/29/2015   DDD (degenerative disc disease), lumbar 01/30/2015   Lumbar radiculitis 01/30/2015   Osteoarthritis of spine with radiculopathy, 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   Difficulty walking 08/22/2014   History of coronary artery disease 06/14/2014   Ataxia 09/10/2013   Back pain 09/10/2013   Morbid obesity due to excess calories (HCC) 09/10/2013   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   Deficiency of other specified B group vitamins 08/28/2013   Hemorrhage of rectum and anus 05/24/2013    PCP: Kristina Tinnie POUR, PA-C REFERRING PROVIDER: Benton Dowse, FNP [facility])  REFERRING DIAG: Spinal Stenosis, imbalance, difficulty walking   Rationale for Evaluation and Treatment: Rehabilitation THERAPY DIAG:  Chronic bilateral low back pain with left-sided sciatica  Difficulty in walking, not elsewhere classified  Unsteadiness on feet  ONSET DATE: Chronic > 10 years   SUBJECTIVE:                                                                                                                                                                                          SUBJECTIVE STATEMENT: Pt states that he had severe low back pain and that it kept him up until 6 am.   PERTINENT  HISTORY:  83 year old man referred to PT by physiatry due to progressive back pain and balance and difficulty walking, patient has known lumbar spinal stenosis.  Patient is well-known to this clinic was seen in 2016 and 2021 for same issue.  Patient has remained diligent with his exercises and remaining mobile however things have gotten worse and his pain is much more limiting while being in a standing position even more so with walking.  Patient is very unsteady at current baseline but is typically able to avoid falling.  Reports 2 falls in the past year.  Patient manages his pain with Tylenol  and/or meloxicam as needed.  Patient uses a single-point cane or occasionally a 4 [employer].  Patient is unable to undergo back injections at present due to poor glucose control.  Patient reports he was recommended to have surgery for this years ago but declined at that time.  He is unsure if surgery is still a recommendation. Average pain 6 out of 10, worst pain 8 out of 10 with walking, best pain 2 out of 10 in the 7 days prior to evaluation.    PAIN:  Are you having pain? Yes 3-4/10 across low back, into Rt flank, bilat posterior hips. PRECAUTIONS: high falls risk WEIGHT BEARING RESTRICTIONS: No FALLS:  Has patient fallen in last 6 months? 2 which he reports were avoidable, related to poor decision planning in the shower, bending without support.  LIVING ENVIRONMENT: Lives with: Wife, dog Lives in: house Stairs: 7 partial steps with hand support, adequately deep, patient reports he is able to navigate these without difficulty Has following equipment at home: [facility], 5TT  OCCUPATION: Retired. PLOF: Modified independent.  PATIENT GOALS: Patient wants to walk better, stand longer, maybe even have some pain relief.  NEXT MD VISIT: Unclear however at time of evaluation most recent lumbar MRI is posted and he would likely schedule in clinic to discuss results of imaging  OBJECTIVE:  Note: Objective measures were  completed at Evaluation unless otherwise noted.  DIAGNOSTIC FINDINGS:  Lumbar MRI 06/29/2023 CLINICAL DATA:  Lower back pain for 5 years.   EXAM: MRI LUMBAR SPINE WITHOUT CONTRAST   TECHNIQUE: Multiplanar, multisequence MR imaging of the lumbar  spine was performed. No intravenous contrast was administered.   COMPARISON:  MRI lumbar spine 08/28/2014.   FINDINGS: Segmentation:  Standard.   Alignment: Lumbar lordosis is maintained. Grade 1 anterolisthesis of L3 on L4 and of L4 on L5. 4 mm retrolisthesis of L5 on S1.   Vertebrae: Prominent Modic type 1 degenerative endplate changes at L2-3 with associated discogenic edema which is new from prior. No additional bone marrow edema or evidence of fracture. Hemangioma in the L2 vertebral body.   Conus medullaris and cauda equina: Conus extends to the L1 level. Conus and cauda equina appear normal.   Paraspinal and other soft tissues: The visualized paraspinal soft tissues are unremarkable.   Disc levels:  T12-L1: No significant disc bulge. No significant spinal canal stenosis or foraminal stenosis.   L1-2: No significant disc bulge. No significant spinal canal stenosis or foraminal stenosis. Mild facet arthrosis.   L2-3: Disc desiccation and moderate disc height loss. Diffuse disc bulge eccentric to the left which indents the ventral thecal sac resulting in lateral recess narrowing. Moderate facet arthrosis with bilateral facet effusions which indent the dorsal thecal sac more pronounced on the left. Severe spinal canal stenosis noted. Moderate bilateral foraminal stenosis slightly greater on the left. Facet osteophytes likely contact the exiting left L2 nerve root.   L3-4: Mild disc height loss. Diffuse disc bulge which indents the ventral thecal sac with lateral recess narrowing. Severe facet arthrosis. Small facet effusion on the left. Moderate to severe spinal canal stenosis at this level with significant crowding of  the cauda equina nerve roots. Moderate bilateral foraminal stenosis.   L4-5: Grade 1 anterolisthesis with partial uncovering of the disc. Diffuse disc bulge with lateral recess narrowing. Severe facet arthrosis and bilateral facet effusions. Mild spinal canal stenosis. There is lateral recess narrowing with possible impingement upon the traversing nerve roots. Moderate bilateral foraminal stenosis.   L5-S1: Retrolisthesis. No significant spinal canal stenosis. Mild facet arthrosis. Moderate right and mild left foraminal stenosis.   IMPRESSION: Degenerative changes of the lumbar spine as above. Severe spinal canal stenosis at L2-3 and additional moderate to severe spinal canal stenosis at L3-4.   Disc bulge at L4-5 resulting in lateral recess narrowing with likely impingement upon the traversing nerve roots.   Moderate foraminal stenosis at multiple levels. Facet osteophytes at L2-3 likely contact the exiting left L2 nerve root.   Facet arthrosis throughout the lumbar spine with facet effusions noted at multiple levels.   Degenerative endplate changes and discogenic edema at L2-3 which may contribute to back pain.    Electronically Signed   By: Donnice Mania M.D.   On: 06/29/2023 13:53  PATIENT SURVEYS:    MODI: 50% (07/27/23)  SENSATION: Not assessed, will defer to physiatry notes.  PALPATION: Taut paraspinal muscles along the Rt lumbar region in particular.  LOWER EXTREMITY MMT:  will complete on visit 2 FUNCTIONAL TESTS:  5 times sit to stand: 16.70sec hands on knees, 22 surface height  Timed up and go (TUG): c 4WW 22.45sec 10 meter walk test: c SPC, 0.35m/s, ataxic without LOB  (07/21/23): 4WW 424ft 3 minutes 48sec, unable to conitnue due to leg weakness  GAIT: Distance walked: waiting area to exam room 2 c SPC, very small steps, very slow, appears unsteady, holds back with left hand for antalgia- modI level.   BALANCE:  [facility] PT Assessment - 07/21/23 0001        Berg Balance Test   Sit to Stand Able to stand  using hands after several tries    Standing Unsupported Able to stand safely 2 minutes    Sitting with Back Unsupported but Feet Supported on Floor or Stool Able to sit safely and securely 2 minutes    Stand to Sit Sits safely with minimal use of hands    Transfers Able to transfer safely, minor use of hands    Standing Unsupported with Eyes Closed Able to stand 3 seconds    Standing Unsupported with Feet Together Needs help to attain position and unable to hold for 15 seconds    From Standing, Reach Forward with Outstretched Arm Can reach forward >12 cm safely (5)    From Standing Position, Pick up Object from Floor Unable to try/needs assist to keep balance    From Standing Position, Turn to Look Behind Over each Shoulder --   will finish on visit 3      [facility] PT Assessment - 07/25/23 0001       Berg Balance Test   From Standing Position, Turn to Look Behind Over each Shoulder Needs assist to keep from losing balance and falling    Turn 360 Degrees Needs assistance while turning    Standing Unsupported, Alternately Place Feet on Step/Stool Needs assistance to keep from falling or unable to try    Standing Unsupported, One Foot in [employer] balance while stepping or standing    Standing on One Leg Unable to try or needs assist to prevent fall    Berg comment: 23/56         TREATMENT DATE   08/10/23:  THEREX   Nu-Step with seat at 7 for 5 min  Plantigrade Position Hip Extension with yellow band 1 x 10   Plantigrade Position Hip Extension with red band 1 x 10   Seated Long Arc Quad with red band 1 x 10  Seated Long Arc Quad with green band alternating 2 x 10    SELF CARE HOME MANAGEMENT  How to safely place theraband around ankles in seated position.  Recommendation to utilize rollator to decrease upright posture to reduce symptoms.   08/08/23:           THEREX   Nu-Step with seat at 7 for 5 min  Quadruped Hip Extension 2  x 10  -min VC to keep knee flexed   -Pt reports increased pain in shoulders   Standing Hip Extension 3 x 10  -Pt reports no increase in pain   -Unable to increase resistance due to risk of skin tearing or being injured or if using theraband     PATIENT EDUCATION:  Education details: Role and use of 4WW, heat v ice application to back  Person educated: patient  Education method: collaborative learning, deliberate practice, positive reinforcement, explicit instruction, establish rules. Education comprehension: Acknowledges, will need reinforcement   HOME EXERCISE PROGRAM: Access Code: [email or web address] URL: [email or web address] Date: 08/10/2023 Prepared by: Toribio Servant  Exercises - Side Stepping with Counter Support  - 3 x daily - 7 x weekly - 1 sets - 20 reps - Standing Supported Hip Extension at [employer]  - 3-4 x weekly - 3 sets - 10 reps - Sitting Knee Extension with Resistance  - 3-4 x weekly - 3 sets - 10 reps  ASSESSMENT: CLINICAL IMPRESSION: Pt continues to show ongoing improvement with pain tolerance and symptom management with flexion based exercises. He was able to complete all exercises during session without being limited by pain. PT recommends that pt utilize rollator  more than SPC to avoid lumbar extension and to see whether this will make a difference in reducing his low back pain and symptoms. He will continue to help patient achieve long-term goals of care improved safety, improve quality of life and retain the ability to be a community dwelling adult.   OBJECTIVE IMPAIRMENTS: Abnormal gait, decreased activity tolerance, decreased balance, decreased knowledge of use of DME, decreased mobility, difficulty walking, decreased ROM, decreased strength, hypomobility, increased muscle spasms, and impaired flexibility.  ACTIVITY LIMITATIONS: carrying, lifting, bending, standing, squatting, transfers, bathing, toileting, dressing, and locomotion level PARTICIPATION  LIMITATIONS: meal prep, cleaning, laundry, interpersonal relationship, shopping, community activity, and yard work PERSONAL FACTORS: Age, Past/current experiences, and Time since onset of injury/illness/exacerbation are also affecting patient's functional outcome.  REHAB POTENTIAL: Good CLINICAL PRESENTATION: Stable/uncomplicated EVALUATION COMPLEXITY: Moderate  GOALS: Goals reviewed with patient? Yes  SHORT TERM GOALS: Target date: 08/18/23 TUG improvement by >5sec Baseline: 07/19/23: 22.45sec c 4WW Goal status: ONGOING    2.  5xSTS improved in seat height by >2 inches.  Baseline: 07/19/23: 22 required for hands free testing 16.7sec Goal status: ONGOING    3.  increase >15ft Baseline: 474ft on 07/21/23 c 4WW;  Goal status: advancing  LONG TERM GOALS: Target date: 10/19/23 Patient will demonstrate with LRAD greater than 1200 feet to demonstrate safe ability to perform limited community distance walking Baseline: 427ft on 07/21/23 c 4WW; Goal status: ONGOING    2.  Patient will perform 5 times STS less than 15s hands-free from standard seat height. Baseline: Requires 22 inch seat height Goal status: ONGOING   3.  Patient to demonstrate TUG test less than 10 seconds device ad lib. to indicate reduced falls risk.  Baseline: 22.45 seconds with rollator Goal status: ONGOING    4.  Patient to score greater than 10 point improvement on BBT to indicate improved balance performance Baseline: 23/56  Goal status: ONGOING   PLAN: PT FREQUENCY: 1-2x/week PT DURATION: 12 weeks PLANNED INTERVENTIONS: 97110-Therapeutic exercises, 97530- Therapeutic activity, 97112- Neuromuscular re-education, 97535- Self Care, [id number]- Manual therapy, [phone]- Gait training, Balance training, Stair training, Joint mobilization, Joint manipulation, Spinal manipulation, Spinal mobilization, Scar mobilization, and Vestibular training  PLAN FOR NEXT SESSION: Attempt static and dynamic balance exercises with some  UE support to avoid lumbar extension: Seated balance exercises? Hip Stretches: HS and Hip ER for improved hip mobility. Introduce abdominal exercises: TA Activation.   Toribio Servant PT, DPT  [facility] [address] [place], [address] Phone: [phone]   Fax:  [phone]

## 2023-08-11 ENCOUNTER — Other Ambulatory Visit: Payer: Self-pay | Admitting: Physician Assistant

## 2023-08-15 ENCOUNTER — Ambulatory Visit: Admitting: Physical Therapy

## 2023-08-15 ENCOUNTER — Encounter: Payer: Self-pay | Admitting: Physical Therapy

## 2023-08-15 ENCOUNTER — Telehealth: Payer: Self-pay

## 2023-08-15 DIAGNOSIS — R2681 Unsteadiness on feet: Secondary | ICD-10-CM

## 2023-08-15 DIAGNOSIS — M5442 Lumbago with sciatica, left side: Secondary | ICD-10-CM | POA: Diagnosis not present

## 2023-08-15 DIAGNOSIS — R262 Difficulty in walking, not elsewhere classified: Secondary | ICD-10-CM

## 2023-08-15 DIAGNOSIS — G8929 Other chronic pain: Secondary | ICD-10-CM

## 2023-08-15 NOTE — Progress Notes (Signed)
   08/15/2023  Patient ID: Allen [name], male   DOB: 03-28-1940, 83 y.o.   MRN: [id number]  Attempted to contact patient for medication management/review. Left HIPAA compliant message for patient to return my call at their convenience. Per chart review, patient recently saw endocrinologist. Provider and patient agreed on hemoglobin A1c goal <8%. Last hemoglobin A1c was in the fall of last year. Patient is overdue for hemoglobin A1c.     Will follow up with patient in 2-4 weeks.  Thank you for allowing pharmacy to be a part of this patient's care.  Dorcas Solian, PharmD Clinical Pharmacist Cell: [phone]

## 2023-08-15 NOTE — Therapy (Signed)
 OUTPATIENT PHYSICAL THERAPY TREATMENT  Patient Name: Allen [name] MRN: [id number] DOB:Mar 06, 1940, 83 y.o., male Today's Date: 08/15/2023  END OF SESSION:  PT End of Session - 08/15/23 1608     Visit Number 7    Number of Visits 16    Date for PT Re-Evaluation 09/13/23    Authorization Type Medicare A&B; [employer] supplemental    Authorization Time Period 07/19/23-09/13/23    Authorization - Visit Number 7    Authorization - Number of Visits 16    Progress Note Due on Visit 10    PT Start Time 1605    PT Stop Time 1645    PT Time Calculation (min) 40 min    Equipment Utilized During Treatment Gait belt    Activity Tolerance Patient tolerated treatment well;No increased pain    Behavior During Therapy WFL for tasks assessed/performed         Past Medical History:  Diagnosis Date   Coronary artery disease    a. 1989 s/p MI w/ PTCA to unknown vessel. Reports cath 6 months later with occluded vessel and collaterals--> medically managed since..   Diabetes mellitus without complication (HCC)    Hyperlipidemia LDL goal <70    Hypertension    a. 02/2020 nl renal artery duplex.   MI (myocardial infarction) (HCC)    PAD (peripheral artery disease) (HCC)    a. 12/2019 ABIs: R 1.02, L 0.56-->unchanged.   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   Regular astigmatism 08/11/2022   Atherosclerosis of native arteries of extremity with intermittent claudication (HCC) 01/03/2022   Need for shingles vaccine 01/22/2019   Need for vaccination against Streptococcus pneumoniae using pneumococcal conjugate vaccine 7 01/22/2019   Encounter for  general adult medical examination with abnormal findings 01/18/2018   Abnormal liver function 01/18/2018   S/P laparoscopic cholecystectomy 01/18/2018   Bradycardia 01/18/2018   Cutaneous candidiasis 01/18/2018   Oral candidiasis 01/18/2018   Septic shock (HCC) 01/08/2018   Ascending cholangitis 01/08/2018   CAD (coronary artery disease) 01/08/2018   Sepsis (HCC) 01/08/2018   Insect bite of right lower leg 09/25/2017   Atopic dermatitis 09/25/2017   PAD (peripheral artery disease) (HCC) 11/24/2015   Type 2 diabetes mellitus with hyperglycemia, without long-term current use of insulin  (HCC) 11/24/2015   Hyperlipidemia 11/24/2015   Essential hypertension 11/24/2015   PVD (peripheral vascular disease) (HCC) 10/29/2015   DDD (degenerative disc disease), lumbar 01/30/2015   Lumbar radiculitis 01/30/2015   Osteoarthritis of spine with radiculopathy, 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   Difficulty walking 08/22/2014   History of coronary artery disease 06/14/2014   Ataxia 09/10/2013   Back pain 09/10/2013   Morbid obesity due to excess calories (HCC) 09/10/2013   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   Deficiency of other specified B group vitamins 08/28/2013   Hemorrhage of rectum and anus 05/24/2013    PCP: Kristina Tinnie POUR, PA-C REFERRING PROVIDER: Benton Dowse, FNP [facility])  REFERRING DIAG: Spinal Stenosis, imbalance, difficulty walking   Rationale for Evaluation and Treatment: Rehabilitation THERAPY DIAG:  Chronic bilateral low back pain with left-sided sciatica  Difficulty in walking, not elsewhere classified  Unsteadiness on feet  ONSET DATE: Chronic > 10 years   SUBJECTIVE:                                                                                                                                                                                          SUBJECTIVE STATEMENT: Pt reports improvement in symptoms in low back pain since discontinuing sit to stand and using  rollator.    PERTINENT HISTORY:  83 year old man referred to PT by physiatry due to progressive back pain and balance and difficulty walking, patient has known lumbar spinal stenosis.  Patient is well-known to this clinic was seen in 2016 and 2021 for same issue.  Patient has remained diligent with his exercises and remaining mobile however things have gotten worse and his pain is much more limiting while being in a standing position even more so with walking.  Patient is very unsteady at current baseline but is typically able to avoid falling.  Reports 2 falls in the past year.  Patient manages his pain with Tylenol  and/or meloxicam as needed.  Patient uses a single-point cane or occasionally a 4 [employer].  Patient is unable to undergo back injections at present due to poor glucose control.  Patient reports he was recommended to have surgery for this years ago but declined at that time.  He is unsure if surgery is still a recommendation. Average pain 6 out of 10, worst pain 8 out of 10 with walking, best pain 2 out of 10 in the 7 days prior to evaluation.    PAIN:  Are you having pain? Yes 3-4/10 across low back, into Rt flank, bilat posterior hips. PRECAUTIONS: high falls risk WEIGHT BEARING RESTRICTIONS: No FALLS:  Has patient fallen in last 6 months? 2 which he reports were avoidable, related to poor decision planning in the shower, bending without support.  LIVING ENVIRONMENT: Lives with: Wife, dog Lives in: house Stairs: 7 partial steps with hand support, adequately deep, patient reports he is able to navigate these without difficulty Has following equipment at home: [facility], 5TT  OCCUPATION: Retired. PLOF: Modified independent.  PATIENT GOALS: Patient wants to walk better, stand longer, maybe even have some pain relief.  NEXT MD VISIT: Unclear however at time of evaluation most recent lumbar MRI is posted and he would likely schedule in clinic to discuss results of imaging  OBJECTIVE:   Note: Objective measures were completed at Evaluation unless otherwise noted.  DIAGNOSTIC FINDINGS:  Lumbar MRI 06/29/2023 CLINICAL DATA:  Lower back pain for 5 years.   EXAM: MRI LUMBAR SPINE WITHOUT CONTRAST   TECHNIQUE: Multiplanar, multisequence MR imaging of the lumbar  spine was performed. No intravenous contrast was administered.   COMPARISON:  MRI lumbar spine 08/28/2014.   FINDINGS: Segmentation:  Standard.   Alignment: Lumbar lordosis is maintained. Grade 1 anterolisthesis of L3 on L4 and of L4 on L5. 4 mm retrolisthesis of L5 on S1.   Vertebrae: Prominent Modic type 1 degenerative endplate changes at L2-3 with associated discogenic edema which is new from prior. No additional bone marrow edema or evidence of fracture. Hemangioma in the L2 vertebral body.   Conus medullaris and cauda equina: Conus extends to the L1 level. Conus and cauda equina appear normal.   Paraspinal and other soft tissues: The visualized paraspinal soft tissues are unremarkable.   Disc levels:  T12-L1: No significant disc bulge. No significant spinal canal stenosis or foraminal stenosis.   L1-2: No significant disc bulge. No significant spinal canal stenosis or foraminal stenosis. Mild facet arthrosis.   L2-3: Disc desiccation and moderate disc height loss. Diffuse disc bulge eccentric to the left which indents the ventral thecal sac resulting in lateral recess narrowing. Moderate facet arthrosis with bilateral facet effusions which indent the dorsal thecal sac more pronounced on the left. Severe spinal canal stenosis noted. Moderate bilateral foraminal stenosis slightly greater on the left. Facet osteophytes likely contact the exiting left L2 nerve root.   L3-4: Mild disc height loss. Diffuse disc bulge which indents the ventral thecal sac with lateral recess narrowing. Severe facet arthrosis. Small facet effusion on the left. Moderate to severe spinal canal stenosis at this level  with significant crowding of the cauda equina nerve roots. Moderate bilateral foraminal stenosis.   L4-5: Grade 1 anterolisthesis with partial uncovering of the disc. Diffuse disc bulge with lateral recess narrowing. Severe facet arthrosis and bilateral facet effusions. Mild spinal canal stenosis. There is lateral recess narrowing with possible impingement upon the traversing nerve roots. Moderate bilateral foraminal stenosis.   L5-S1: Retrolisthesis. No significant spinal canal stenosis. Mild facet arthrosis. Moderate right and mild left foraminal stenosis.   IMPRESSION: Degenerative changes of the lumbar spine as above. Severe spinal canal stenosis at L2-3 and additional moderate to severe spinal canal stenosis at L3-4.   Disc bulge at L4-5 resulting in lateral recess narrowing with likely impingement upon the traversing nerve roots.   Moderate foraminal stenosis at multiple levels. Facet osteophytes at L2-3 likely contact the exiting left L2 nerve root.   Facet arthrosis throughout the lumbar spine with facet effusions noted at multiple levels.   Degenerative endplate changes and discogenic edema at L2-3 which may contribute to back pain.    Electronically Signed   By: Donnice Mania M.D.   On: 06/29/2023 13:53  PATIENT SURVEYS:    MODI: 50% (07/27/23)  SENSATION: Not assessed, will defer to physiatry notes.  PALPATION: Taut paraspinal muscles along the Rt lumbar region in particular.  LOWER EXTREMITY MMT:  will complete on visit 2 FUNCTIONAL TESTS:  5 times sit to stand: 16.70sec hands on knees, 22 surface height  Timed up and go (TUG): c 4WW 22.45sec 10 meter walk test: c SPC, 0.62m/s, ataxic without LOB  (07/21/23): 4WW 450ft 3 minutes 48sec, unable to conitnue due to leg weakness  GAIT: Distance walked: waiting area to exam room 2 c SPC, very small steps, very slow, appears unsteady, holds back with left hand for antalgia- modI level.   BALANCE:  [facility] PT  Assessment - 07/21/23 0001       Berg Balance Test   Sit to Stand Able to stand  using hands after several tries    Standing Unsupported Able to stand safely 2 minutes    Sitting with Back Unsupported but Feet Supported on Floor or Stool Able to sit safely and securely 2 minutes    Stand to Sit Sits safely with minimal use of hands    Transfers Able to transfer safely, minor use of hands    Standing Unsupported with Eyes Closed Able to stand 3 seconds    Standing Unsupported with Feet Together Needs help to attain position and unable to hold for 15 seconds    From Standing, Reach Forward with Outstretched Arm Can reach forward >12 cm safely (5)    From Standing Position, Pick up Object from Floor Unable to try/needs assist to keep balance    From Standing Position, Turn to Look Behind Over each Shoulder --   will finish on visit 3      [facility] PT Assessment - 07/25/23 0001       Berg Balance Test   From Standing Position, Turn to Look Behind Over each Shoulder Needs assist to keep from losing balance and falling    Turn 360 Degrees Needs assistance while turning    Standing Unsupported, Alternately Place Feet on Step/Stool Needs assistance to keep from falling or unable to try    Standing Unsupported, One Foot in [employer] balance while stepping or standing    Standing on One Leg Unable to try or needs assist to prevent fall    Berg comment: 23/56         TREATMENT DATE   08/15/23: THEREX   Nu-Step with seat at 7 for 6 min  Seated Hip ER Stretch on RLE  3 x 60 sec  Seated Hip ER Stretch on LLE with additional use of strap to left foot 3 x 60 sec Modified Thomas Stretch on incline to avoid lumbar extension 4 x 60 sec  Seated HS Stretch 4 x 60 sec     NMR   Romberg with forward lean with two fingers 30 sec   -Pt reports increased low back pain  Romberg with forward lean with abdominal bracing 5 x 30 sec   -Pt reports improved in low back pain with abdominal bracing      PATIENT EDUCATION:  Education details: Role and use of 4WW, heat v ice application to back  Person educated: patient  Education method: collaborative learning, deliberate practice, positive reinforcement, explicit instruction, establish rules. Education comprehension: Acknowledges, will need reinforcement   HOME EXERCISE PROGRAM: Access Code: [email or web address] URL: [email or web address] Date: 08/15/2023 Prepared by: Toribio Servant  Exercises - Seated Hamstring Stretch  - 1 x daily - 7 x weekly - 3 reps - 60 sec hold - Seated Hip External Rotation Stretch  - 1 x daily - 7 x weekly - 3 reps - 60 sec  hold - Supine Dynamic Modified Debby Hawthorne and Hip Flexor Dynamic Stretch  - 1 x daily - 7 x weekly - 3 reps - 60 sec hold - Side Stepping with Counter Support  - 3 x daily - 7 x weekly - 1 sets - 20 reps - Standing Supported Hip Extension at [employer]  - 3-4 x weekly - 3 sets - 10 reps - Sitting Knee Extension with Resistance  - 3-4 x weekly - 3 sets - 10 reps  ASSESSMENT: CLINICAL IMPRESSION: Pt continues to demonstrate excellent response to flexion based exercises with little to no provocation in symptoms. He did exhibit improved tolerance of standing  position with abdominal activation likely decreased lumbar lordosis. He will continue to help patient achieve long-term goals of care improved safety, improve quality of life and retain the ability to be a community dwelling adult.   OBJECTIVE IMPAIRMENTS: Abnormal gait, decreased activity tolerance, decreased balance, decreased knowledge of use of DME, decreased mobility, difficulty walking, decreased ROM, decreased strength, hypomobility, increased muscle spasms, and impaired flexibility.  ACTIVITY LIMITATIONS: carrying, lifting, bending, standing, squatting, transfers, bathing, toileting, dressing, and locomotion level PARTICIPATION LIMITATIONS: meal prep, cleaning, laundry, interpersonal relationship, shopping, community activity,  and yard work PERSONAL FACTORS: Age, Past/current experiences, and Time since onset of injury/illness/exacerbation are also affecting patient's functional outcome.  REHAB POTENTIAL: Good CLINICAL PRESENTATION: Stable/uncomplicated EVALUATION COMPLEXITY: Moderate  GOALS: Goals reviewed with patient? Yes  SHORT TERM GOALS: Target date: 08/18/23 TUG improvement by >5sec Baseline: 07/19/23: 22.45sec c 4WW Goal status: ONGOING    2.  5xSTS improved in seat height by >2 inches.  Baseline: 07/19/23: 22 required for hands free testing 16.7sec Goal status: ONGOING    3.  increase >194ft Baseline: 457ft on 07/21/23 c 4WW;  Goal status: advancing  LONG TERM GOALS: Target date: 10/19/23 Patient will demonstrate with LRAD greater than 1200 feet to demonstrate safe ability to perform limited community distance walking Baseline: 413ft on 07/21/23 c 4WW; Goal status: ONGOING    2.  Patient will perform 5 times STS less than 15s hands-free from standard seat height. Baseline: Requires 22 inch seat height Goal status: ONGOING   3.  Patient to demonstrate TUG test less than 10 seconds device ad lib. to indicate reduced falls risk.  Baseline: 22.45 seconds with rollator Goal status: ONGOING    4.  Patient to score greater than 10 point improvement on BBT to indicate improved balance performance Baseline: 23/56  Goal status: ONGOING   PLAN: PT FREQUENCY: 1-2x/week PT DURATION: 12 weeks PLANNED INTERVENTIONS: 97110-Therapeutic exercises, 97530- Therapeutic activity, 97112- Neuromuscular re-education, 97535- Self Care, [id number]- Manual therapy, [phone]- Gait training, Balance training, Stair training, Joint mobilization, Joint manipulation, Spinal manipulation, Spinal mobilization, Scar mobilization, and Vestibular training  PLAN FOR NEXT SESSION: Continue with abdominal bracing and static balance and dynamic balance: standing marches and stands. Recommend 2WW for use outside of car because of ease of  transport. Add abdominal bracing to sheet.   Toribio Servant PT, DPT  [facility] [address], [place], [address] Phone: [phone]   Fax:  [phone]

## 2023-08-17 ENCOUNTER — Ambulatory Visit: Attending: Family Medicine | Admitting: Physical Therapy

## 2023-08-17 DIAGNOSIS — M5442 Lumbago with sciatica, left side: Secondary | ICD-10-CM | POA: Insufficient documentation

## 2023-08-17 DIAGNOSIS — R2681 Unsteadiness on feet: Secondary | ICD-10-CM | POA: Diagnosis not present

## 2023-08-17 DIAGNOSIS — R262 Difficulty in walking, not elsewhere classified: Secondary | ICD-10-CM | POA: Diagnosis not present

## 2023-08-17 DIAGNOSIS — G8929 Other chronic pain: Secondary | ICD-10-CM | POA: Diagnosis not present

## 2023-08-17 NOTE — Therapy (Signed)
 OUTPATIENT PHYSICAL THERAPY TREATMENT  Patient Name: Allen [name] MRN: [id number] DOB:24-Apr-1940, 83 y.o., male Today's Date: 08/17/2023  END OF SESSION:  PT End of Session - 08/17/23 1307     Visit Number 8    Number of Visits 16    Date for PT Re-Evaluation 09/13/23    Authorization Type Medicare A&B; [employer] supplemental    Authorization Time Period 07/19/23-09/13/23    Authorization - Visit Number 8    Authorization - Number of Visits 16    Progress Note Due on Visit 10    PT Start Time 1303    PT Stop Time 1345    PT Time Calculation (min) 42 min    Equipment Utilized During Treatment Gait belt    Activity Tolerance Patient tolerated treatment well;No increased pain    Behavior During Therapy WFL for tasks assessed/performed         Past Medical History:  Diagnosis Date   Coronary artery disease    a. 1989 s/p MI w/ PTCA to unknown vessel. Reports cath 6 months later with occluded vessel and collaterals--> medically managed since..   Diabetes mellitus without complication (HCC)    Hyperlipidemia LDL goal <70    Hypertension    a. 02/2020 nl renal artery duplex.   MI (myocardial infarction) (HCC)    PAD (peripheral artery disease) (HCC)    a. 12/2019 ABIs: R 1.02, L 0.56-->unchanged.   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   Regular astigmatism 08/11/2022   Atherosclerosis of native arteries of extremity with intermittent claudication (HCC) 01/03/2022   Need for shingles vaccine 01/22/2019   Need for vaccination against Streptococcus pneumoniae using pneumococcal conjugate vaccine 7 01/22/2019   Encounter for  general adult medical examination with abnormal findings 01/18/2018   Abnormal liver function 01/18/2018   S/P laparoscopic cholecystectomy 01/18/2018   Bradycardia 01/18/2018   Cutaneous candidiasis 01/18/2018   Oral candidiasis 01/18/2018   Septic shock (HCC) 01/08/2018   Ascending cholangitis 01/08/2018   CAD (coronary artery disease) 01/08/2018   Sepsis (HCC) 01/08/2018   Insect bite of right lower leg 09/25/2017   Atopic dermatitis 09/25/2017   PAD (peripheral artery disease) (HCC) 11/24/2015   Type 2 diabetes mellitus with hyperglycemia, without long-term current use of insulin  (HCC) 11/24/2015   Hyperlipidemia 11/24/2015   Essential hypertension 11/24/2015   PVD (peripheral vascular disease) (HCC) 10/29/2015   DDD (degenerative disc disease), lumbar 01/30/2015   Lumbar radiculitis 01/30/2015   Osteoarthritis of spine with radiculopathy, 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   Difficulty walking 08/22/2014   History of coronary artery disease 06/14/2014   Ataxia 09/10/2013   Back pain 09/10/2013   Morbid obesity due to excess calories (HCC) 09/10/2013   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   Deficiency of other specified B group vitamins 08/28/2013   Hemorrhage of rectum and anus 05/24/2013    PCP: Kristina Tinnie POUR, PA-C REFERRING PROVIDER: Benton Dowse, FNP [facility])  REFERRING DIAG: Spinal Stenosis, imbalance, difficulty walking   Rationale for Evaluation and Treatment: Rehabilitation THERAPY DIAG:  Chronic bilateral low back pain with left-sided sciatica  Difficulty in walking, not elsewhere classified  Unsteadiness on feet  ONSET DATE: Chronic > 10 years   SUBJECTIVE:                                                                                                                                                                                          SUBJECTIVE STATEMENT: Pt states that he had an increase in his low back pain after last session and he is not sure what  caused it. However, this pain has gone away.   PERTINENT HISTORY:  83 year old man referred to PT by physiatry due to progressive back pain and balance and difficulty walking, patient has known lumbar spinal stenosis.  Patient is well-known to this clinic was seen in 2016 and 2021 for same issue.  Patient has remained diligent with his exercises and remaining mobile however things have gotten worse and his pain is much more limiting while being in a standing position even more so with walking.  Patient is very unsteady at current baseline but is typically able to avoid falling.  Reports 2 falls in the past year.  Patient manages his pain with Tylenol  and/or meloxicam as needed.  Patient uses a single-point cane or occasionally a 4 [employer].  Patient is unable to undergo back injections at present due to poor glucose control.  Patient reports he was recommended to have surgery for this years ago but declined at that time.  He is unsure if surgery is still a recommendation. Average pain 6 out of 10, worst pain 8 out of 10 with walking, best pain 2 out of 10 in the 7 days prior to evaluation.    PAIN:  Are you having pain? Yes 3-4/10 across low back, into Rt flank, bilat posterior hips. PRECAUTIONS: high falls risk WEIGHT BEARING RESTRICTIONS: No FALLS:  Has patient fallen in last 6 months? 2 which he reports were avoidable, related to poor decision planning in the shower, bending without support.  LIVING ENVIRONMENT: Lives with: Wife, dog Lives in: house Stairs: 7 partial steps with hand support, adequately deep, patient reports he is able to navigate these without difficulty Has following equipment at home: [facility], 5TT  OCCUPATION: Retired. PLOF: Modified independent.  PATIENT GOALS: Patient wants to walk better, stand longer, maybe even have some pain relief.  NEXT MD VISIT: Unclear however at time of evaluation most recent lumbar MRI is posted and he would likely schedule in clinic to discuss  results of imaging  OBJECTIVE:  Note: Objective measures were completed at Evaluation unless otherwise noted.  DIAGNOSTIC FINDINGS:  Lumbar MRI 06/29/2023 CLINICAL DATA:  Lower back pain for 5 years.   EXAM: MRI LUMBAR SPINE WITHOUT  CONTRAST   TECHNIQUE: Multiplanar, multisequence MR imaging of the lumbar spine was performed. No intravenous contrast was administered.   COMPARISON:  MRI lumbar spine 08/28/2014.   FINDINGS: Segmentation:  Standard.   Alignment: Lumbar lordosis is maintained. Grade 1 anterolisthesis of L3 on L4 and of L4 on L5. 4 mm retrolisthesis of L5 on S1.   Vertebrae: Prominent Modic type 1 degenerative endplate changes at L2-3 with associated discogenic edema which is new from prior. No additional bone marrow edema or evidence of fracture. Hemangioma in the L2 vertebral body.   Conus medullaris and cauda equina: Conus extends to the L1 level. Conus and cauda equina appear normal.   Paraspinal and other soft tissues: The visualized paraspinal soft tissues are unremarkable.   Disc levels:  T12-L1: No significant disc bulge. No significant spinal canal stenosis or foraminal stenosis.   L1-2: No significant disc bulge. No significant spinal canal stenosis or foraminal stenosis. Mild facet arthrosis.   L2-3: Disc desiccation and moderate disc height loss. Diffuse disc bulge eccentric to the left which indents the ventral thecal sac resulting in lateral recess narrowing. Moderate facet arthrosis with bilateral facet effusions which indent the dorsal thecal sac more pronounced on the left. Severe spinal canal stenosis noted. Moderate bilateral foraminal stenosis slightly greater on the left. Facet osteophytes likely contact the exiting left L2 nerve root.   L3-4: Mild disc height loss. Diffuse disc bulge which indents the ventral thecal sac with lateral recess narrowing. Severe facet arthrosis. Small facet effusion on the left. Moderate to  severe spinal canal stenosis at this level with significant crowding of the cauda equina nerve roots. Moderate bilateral foraminal stenosis.   L4-5: Grade 1 anterolisthesis with partial uncovering of the disc. Diffuse disc bulge with lateral recess narrowing. Severe facet arthrosis and bilateral facet effusions. Mild spinal canal stenosis. There is lateral recess narrowing with possible impingement upon the traversing nerve roots. Moderate bilateral foraminal stenosis.   L5-S1: Retrolisthesis. No significant spinal canal stenosis. Mild facet arthrosis. Moderate right and mild left foraminal stenosis.   IMPRESSION: Degenerative changes of the lumbar spine as above. Severe spinal canal stenosis at L2-3 and additional moderate to severe spinal canal stenosis at L3-4.   Disc bulge at L4-5 resulting in lateral recess narrowing with likely impingement upon the traversing nerve roots.   Moderate foraminal stenosis at multiple levels. Facet osteophytes at L2-3 likely contact the exiting left L2 nerve root.   Facet arthrosis throughout the lumbar spine with facet effusions noted at multiple levels.   Degenerative endplate changes and discogenic edema at L2-3 which may contribute to back pain.    Electronically Signed   By: Donnice Mania M.D.   On: 06/29/2023 13:53  PATIENT SURVEYS:    MODI: 50% (07/27/23)  SENSATION: Not assessed, will defer to physiatry notes.  PALPATION: Taut paraspinal muscles along the Rt lumbar region in particular.  LOWER EXTREMITY MMT:  will complete on visit 2 FUNCTIONAL TESTS:  5 times sit to stand: 16.70sec hands on knees, 22 surface height  Timed up and go (TUG): c 4WW 22.45sec 10 meter walk test: c SPC, 0.20m/s, ataxic without LOB  (07/21/23): 4WW 453ft 3 minutes 48sec, unable to conitnue due to leg weakness  GAIT: Distance walked: waiting area to exam room 2 c SPC, very small steps, very slow, appears unsteady, holds back with left hand for  antalgia- modI level.   BALANCE:  [facility] PT Assessment - 07/21/23 0001  Berg Balance Test   Sit to Stand Able to stand using hands after several tries    Standing Unsupported Able to stand safely 2 minutes    Sitting with Back Unsupported but Feet Supported on Floor or Stool Able to sit safely and securely 2 minutes    Stand to Sit Sits safely with minimal use of hands    Transfers Able to transfer safely, minor use of hands    Standing Unsupported with Eyes Closed Able to stand 3 seconds    Standing Unsupported with Feet Together Needs help to attain position and unable to hold for 15 seconds    From Standing, Reach Forward with Outstretched Arm Can reach forward >12 cm safely (5)    From Standing Position, Pick up Object from Floor Unable to try/needs assist to keep balance    From Standing Position, Turn to Look Behind Over each Shoulder --   will finish on visit 3      [facility] PT Assessment - 07/25/23 0001       Berg Balance Test   From Standing Position, Turn to Look Behind Over each Shoulder Needs assist to keep from losing balance and falling    Turn 360 Degrees Needs assistance while turning    Standing Unsupported, Alternately Place Feet on Step/Stool Needs assistance to keep from falling or unable to try    Standing Unsupported, One Foot in [employer] balance while stepping or standing    Standing on One Leg Unable to try or needs assist to prevent fall    Berg comment: 23/56         TREATMENT DATE   08/17/23: THEREX  Lumbar Flexion/Extension AAROM Center, Right and Left x 30   Seated Hip ER Stretch on RLE 2 x 60 sec   -min VC to only push knee and to not lean forward    Seated Hip ER Stretch on LLE 2 x 60 sec  -Pt reports increase in left sided low back pain  Long Sitting Hip ER Stretch on bolster LLE 2 x 60 sec  -Pt continues to experience left sided low back pain  Long Sitting Hip Adductor Stretch 2 x 60 sec   -Pt reports not feeling a significant stretch     NMR   Romberg with two finger support x 30 sec  Romberg with two finger support with horizontal head turns 2 x 10   -Pt needed to stop due to bilateral neck pain sharp  Romberg with two finger support with vertical head turns 2 x 10  -moderate sway, hip strategy   Standing with feet shoulder width apart  and vertical head turns with two finger support   2 x 10  -moderate sway, hip strategy   Seated Marches 2 x 10  Seated Marches opposite arm and opposite leg -non- alternating 2 x 10      PATIENT EDUCATION:  Education details: Role and use of 4WW, heat v ice application to back  Person educated: patient  Education method: collaborative learning, deliberate practice, positive reinforcement, explicit instruction, establish rules. Education comprehension: Acknowledges, will need reinforcement   HOME EXERCISE PROGRAM: Access Code: [email or web address] URL: [email or web address] Date: 08/17/2023 Prepared by: Toribio Servant  Exercises - Seated March with Same Side Arm Raise   - 1 x daily - 7 x weekly - 2 sets - 10 reps - Seated Hamstring Stretch  - 1 x daily - 7 x weekly - 3 reps - 60 sec hold - Seated Hip External  Rotation Stretch  - 1 x daily - 7 x weekly - 3 reps - 60 sec  hold - Supine Dynamic Modified Thomas Quad and Hip Flexor Dynamic Stretch  - 1 x daily - 7 x weekly - 3 reps - 60 sec hold - Side Stepping with Counter Support  - 3 x daily - 7 x weekly - 1 sets - 20 reps - Standing Supported Hip Extension at [employer]  - 3-4 x weekly - 3 sets - 10 reps - Sitting Knee Extension with Resistance  - 3-4 x weekly - 3 sets - 10 reps  ASSESSMENT: CLINICAL IMPRESSION: Pt limited by pain left sided low back pain during session. He is limited in performing standing static balance exercises due to tendency for hip strategy and to go into lumbar extension. He shows signs and symptoms left hip SIJ with pain elicited by FABER or hip ER. Modified home exercise plan to exclude hip ER stretch  for left hip and any standing balance exercises. He will continue to help patient achieve long-term goals of care improved safety, improve quality of life and retain the ability to be a community dwelling adult.   OBJECTIVE IMPAIRMENTS: Abnormal gait, decreased activity tolerance, decreased balance, decreased knowledge of use of DME, decreased mobility, difficulty walking, decreased ROM, decreased strength, hypomobility, increased muscle spasms, and impaired flexibility.  ACTIVITY LIMITATIONS: carrying, lifting, bending, standing, squatting, transfers, bathing, toileting, dressing, and locomotion level PARTICIPATION LIMITATIONS: meal prep, cleaning, laundry, interpersonal relationship, shopping, community activity, and yard work PERSONAL FACTORS: Age, Past/current experiences, and Time since onset of injury/illness/exacerbation are also affecting patient's functional outcome.  REHAB POTENTIAL: Good CLINICAL PRESENTATION: Stable/uncomplicated EVALUATION COMPLEXITY: Moderate  GOALS: Goals reviewed with patient? Yes  SHORT TERM GOALS: Target date: 08/18/23 TUG improvement by >5sec Baseline: 07/19/23: 22.45sec c 4WW Goal status: ONGOING    2.  5xSTS improved in seat height by >2 inches.  Baseline: 07/19/23: 22 required for hands free testing 16.7sec Goal status: ONGOING    3.  increase >181ft Baseline: 419ft on 07/21/23 c 4WW;  Goal status: advancing  LONG TERM GOALS: Target date: 10/19/23 Patient will demonstrate with LRAD greater than 1200 feet to demonstrate safe ability to perform limited community distance walking Baseline: 460ft on 07/21/23 c 4WW; Goal status: ONGOING    2.  Patient will perform 5 times STS less than 15s hands-free from standard seat height. Baseline: Requires 22 inch seat height Goal status: ONGOING   3.  Patient to demonstrate TUG test less than 10 seconds device ad lib. to indicate reduced falls risk.  Baseline: 22.45 seconds with rollator Goal status:  ONGOING    4.  Patient to score greater than 10 point improvement on BBT to indicate improved balance performance Baseline: 23/56  Goal status: ONGOING   PLAN: PT FREQUENCY: 1-2x/week PT DURATION: 12 weeks PLANNED INTERVENTIONS: 97110-Therapeutic exercises, 97530- Therapeutic activity, 97112- Neuromuscular re-education, 97535- Self Care, [id number]- Manual therapy, [phone]- Gait training, Balance training, Stair training, Joint mobilization, Joint manipulation, Spinal manipulation, Spinal mobilization, Scar mobilization, and Vestibular training  PLAN FOR NEXT SESSION: Work on transfer using 2WW. Continue to progress seated balance exercises. Work on [employer] activation and valsalva during standing to see if this improves symptoms.   Toribio Servant PT, DPT  [facility] [address] [place], [address] Phone: [phone]   Fax:  [phone]

## 2023-08-22 ENCOUNTER — Ambulatory Visit: Admitting: Physical Therapy

## 2023-08-24 ENCOUNTER — Ambulatory Visit: Admitting: Physical Therapy

## 2023-08-25 ENCOUNTER — Encounter

## 2023-08-29 ENCOUNTER — Telehealth: Payer: Self-pay | Admitting: Physical Therapy

## 2023-08-29 ENCOUNTER — Ambulatory Visit: Admitting: Physical Therapy

## 2023-08-29 NOTE — Telephone Encounter (Signed)
 Called pt to inquire about absence. Pt reports ongoing increased pain. PT recommends revisiting medical team for pain management and to return to physical therapy once his pain is well controlled. He is in agreement with this plan.

## 2023-08-31 ENCOUNTER — Ambulatory Visit: Admitting: Physical Therapy

## 2023-08-31 ENCOUNTER — Other Ambulatory Visit: Payer: Self-pay

## 2023-08-31 DIAGNOSIS — H43813 Vitreous degeneration, bilateral: Secondary | ICD-10-CM | POA: Diagnosis not present

## 2023-08-31 DIAGNOSIS — H26491 Other secondary cataract, right eye: Secondary | ICD-10-CM | POA: Diagnosis not present

## 2023-08-31 DIAGNOSIS — E11319 Type 2 diabetes mellitus with unspecified diabetic retinopathy without macular edema: Secondary | ICD-10-CM | POA: Diagnosis not present

## 2023-08-31 NOTE — Progress Notes (Signed)
 08/31/2023 Name: Allen [name] MRN: [id number] DOB: 1940-08-26  No chief complaint on file.   Allen [name] is a 83 y.o. year old male who presented for a telephone visit.   They were referred to the pharmacist by a quality report for assistance in managing diabetes.    Subjective:  Allen [name] is a 83 year old male veteran followed by [facility] with a PMH significant for diabetes, HTN, and CAD.   Care Team: Primary Care Provider: Kristina Tinnie MARLA DEVONNA ; Next Scheduled Visit: 12/22/2023 [facility] Endocrinologist; Next Scheduled Visit: 07/18/2023  Medication Access/Adherence  Current Pharmacy:  [id number] [address], [place] - [address] AT [facility] [address] [place] [address] Phone: [phone] Fax: [phone]   Patient reports affordability concerns with their medications: No  Patient reports access/transportation concerns to their pharmacy: No  Patient reports adherence concerns with their medications:  The patient reports missing a dose approximately once per week, despite using a pillbox. He primarily misses morning doses and denies uses a reminder.    Diabetes:  Current medications: insulin  glargine 38 units daily tolerating well on this dose and denies hypoglycemia (taking what was prescribed instead of 36 units which he previously reported during my last encounter; he stated that he has always been taking 32 units), Jardiance  25 mg daily, metformin  500 mg twice daily, Novolog 40 units once or twice daily + SSI dosing  with meals (once daily only when he eats and has been trying twice daily for the last two weeks 2-3 times per week); he does not uses I three times a day since he does not three meals a day   Current glucose readings:  - BG today 153 (fasting) - no food or insulin   Previously 354 mg/dL in May   Patient reports that he does not remember if his hemoglobin A1c was checked by endo during his last office  visit in early June   Using CGM meter; testing 4-5 times daily (but reports it makes him depressed)  Date of Download (last 7 days) per patient report: Average Glucose: 195 mg/dL (previously 713 mg/dL in May) Glucose Management Indicator: N/A  Glucose Variability: N/A (goal <36%) Time in Goal:  - Time in range 70-180: 39% - Time above range (250): 26% (was previously 74%) - Time below range: 1% (low blood sugar as a result of not eating and faulty issue with CGM sensor)  Patient attributes improvement in blood glucose related to taking 38 units of basal insulin  and consistently taking 40 units of bolus insulin  (as well as SSI dosing).   Patient denies hypoglycemic s/sx including dizziness, shakiness, sweating. Patient reports hyperglycemic symptoms including polyuria, polydipsia, nocturia.  Current meal patterns: (typically 1 meal daily) - Breakfast- skips  - Lunch - Skips - Supper around 6 pm bacon and 2 eggs - Snacks ice cream/, fudge popsicles (about once daily after dinner), nabs, apples, watermelon - Drinks: carbonated flavored water (10 g carbs for each serving, per patient) - Overall, no changes to diet  Current physical activity: No, due back pain and patient canceled remaining PT sessions due to back pain. He feels that PT worsened back pain.   Hypertension:  Current medications: valsartan  160 mg daily, Toprol -XL 12.5 mg daily    Patient has a validated, automated, upper arm home BP cuff Current blood pressure readings readings:  SBP 120-130 DBP  70-80   Patient denies hypotensive s/sx including dizziness,  lightheadedness.  Patient denies hypertensive symptoms including headache, chest pain, shortness of breath    Hyperlipidemia/ASCVD Risk Reduction  Current lipid lowering medications: Vytorin  10-40 mg once daily   Antiplatelet regimen: aspirin 81 mg daily   The ASCVD Risk score (Arnett DK, et al., 2019) failed to calculate for the following reasons:    The 2019 ASCVD risk score is only valid for ages 63 to 84   Risk score cannot be calculated because patient has a medical history suggesting prior/existing ASCVD    Objective:    Medications Reviewed Today     Reviewed by Allen [name], [facility] (Pharmacist) on 08/31/23 at 1333  Med List Status: <None>   Medication Order Taking? Sig Documenting Provider Last Dose Status Informant  aspirin 81 MG chewable tablet [id number] Yes Chew by mouth daily. [provider]  Active Self  b complex vitamins tablet [id number] Yes Take 1 tablet by mouth daily. [provider]  Active Self  B-D ULTRA-FINE 33 LANCETS MISC [id number] Yes Use 1 Lancet 3 (three) times daily. [provider]  Active            Med Note MAYER, [facility] R   Mon Apr 25, 2023 10:25 AM) Supply  BD PEN NEEDLE NANO 2ND GEN 32G X 4 MM MISC [id number] Yes USE AS DIRECTED FOUR TIMES DAILY WITH LEVEMIR [facility] AND HUMALOG  PEN Khan, Fozia M, MD  Active            Med Note MAYER, Veroncia Jezek R   Mon Apr 25, 2023 10:25 AM) Supply  cetirizine  (ZYRTEC ) 10 MG tablet [id number] Yes Take 10 mg by mouth daily. [provider]  Active Self  Cholecalciferol  (VITAMIN D3) 1000 units CAPS [id number] Yes Take by mouth daily. [provider]  Active   Continuous Blood Gluc Sensor (FREESTYLE LIBRE 14 DAY SENSOR) [place] [id number] Yes Use 1 kit every 14 (fourteen) days McDonough, Tinnie POUR, PA-C  Active            Med Note MAYER, Arbie Blankley R   Mon Apr 25, 2023 10:24 AM) Supply  empagliflozin  (JARDIANCE ) 25 MG TABS tablet [id number] Yes Take 1 tablet (25 mg total) by mouth daily before breakfast. Khan, Fozia M, MD  Active   ezetimibe -simvastatin  (VYTORIN ) 10-40 MG tablet [id number] Yes TAKE 1 TABLET BY MOUTH DAILY McDonough, Lauren K, PA-C  Active   folic acid  (FOLVITE ) 400 MCG tablet [id number] Yes Take 400 mcg by mouth daily. [provider]  Active Self  Garlic 1000 MG CAPS [id number] Yes Take by mouth. [provider]  Active   glucose blood test strip [id number] Yes Check blood sugar 5x a day as directed [provider]  Active            Med Note MAYER DORCAS [name] Pablo Apr 25, 2023 10:24 AM) Supply   Discontinued 08/31/23 1329 (Change in therapy)   insulin  aspart (NOVOLOG) 100 UNIT/ML FlexPen [id number] Yes INJECT 10 UNITS UNDER SKIN TWICE DAILY BEFORE MORNING AND EVENING MEAL FOR DIABETES.DISCARD PEN 28 DAYS AFTER OPENING PLUS SLIDING SCALE [provider]  Active            Med Note MAYER, [facility] R   Wed Jul 13, 2023 11:34 AM) SSI 30- 40 units (reports mostly 40 units)  Insulin  Glargine (BASAGLAR  KWIKPEN) 100 UNIT/ML [id number] Yes ADMINISTER 26 TO 40 UNITS UNDER THE SKIN EVERY DAY McDonough, Tinnie POUR, PA-C  Active  Med Note MAYER DORCAS [name]   Wed Jul 13, 2023 11:34 AM) Patient reports taking 32 units daily  levothyroxine  (SYNTHROID ) 75 MCG tablet [id number] Yes TAKE 1 TABLET BY MOUTH DAILY BEFORE BREAKFAST McDonough, Lauren K, PA-C  Active   meloxicam (MOBIC) 15 MG tablet [id number] Yes Take 15 mg by mouth daily. [provider]  Active   metFORMIN  (GLUCOPHAGE ) 500 MG tablet [id number] Yes TAKE 1 TABLET(500 MG) BY MOUTH TWICE DAILY WITH A MEAL McDonough, Lauren K, PA-C  Active   metoprolol  succinate (TOPROL -XL) 25 MG 24 hr tablet [id number] Yes Take 0.5 tablets (12.5 mg total) by mouth daily. McDonough, Lauren K, PA-C  Active   Multiple Vitamin (MULTIVITAMIN WITH MINERALS) TABS tablet [id number] Yes Take 1 tablet by mouth daily. [provider]  Active Self   Patient not taking:   Discontinued 08/31/23 1333 (Completed Course)            Med Note MAYER DORCAS [name] Pablo Apr 25, 2023 10:33 AM) Taking PRN   Patient not taking:   Discontinued 08/31/23 1333 (Completed Course)    Patient not taking:   Discontinued 08/31/23 1333 (Side effect (s))            Med Note MAYER, Sadiya Durand R   Wed Jul 13, 2023 11:36 AM) He cannot tolerate   traMADol (ULTRAM) 50  MG tablet [id number] Yes Take 100 mg by mouth daily.  [provider]  Active            Med Note GENENE, MARINA  C   Thu Jul 17, 2020 10:46 AM)    valsartan  (DIOVAN ) 160 MG tablet [id number] Yes TAKE 1 TABLET BY MOUTH DAILY McDonough, Lauren K, PA-C  Active   vitamin B-12 (CYANOCOBALAMIN ) 1000 MCG tablet [id number] Yes Take 1,000 mcg by mouth daily. [provider]  Active Self              Assessment/Plan:     Diabetes: - Currently uncontrolled yet home blood glucose readings significantly improved with medication compliance. Patient was congratulated on improved numbers.  - Reviewed long term cardiovascular and renal outcomes of uncontrolled blood sugar - Reviewed goal A1c, goal fasting, and goal 2 hour post prandial glucose - Reviewed dietary modifications including switch to fudge popsicles (lower glycemic index), well-balanced dinner & add breakfast (such as bacon, eggs), water enhancer (such as MIO, mindful of dyes) that have no carbohydrates or lower carbohydrates - Reviewed lifestyle modifications including: Encouraged patient to contact PT  when able to resume sessions - Recommend to continue current regimen as his blood glucose significantly improved and denies symptoms of hypoglycemia. Encouraged patient to continue to consistently take bolus insulin  as prescribed when he eats. Patient is followed by endocrinologist.  - Recommend to check glucose 2-4 times daily (fasting, post prandial, and random BG since he eats once daily) - Will defer updated hemoglobin A1c to PCP or endocrinologist.     Hypertension: - Currently stable - Reviewed long term cardiovascular and renal outcomes of uncontrolled blood pressure - Reviewed appropriate blood pressure monitoring technique and reviewed goal blood pressure. Recommended to check home blood pressure and heart rate 1-2 times per week (consistently) - Recommend to continue current regimen.     Hyperlipidemia/ASCVD  Risk Reduction: - Currently controlled, per last office visit labs in 2023; no recent lipid panel in Care Everywhere - Reviewed long term complications of uncontrolled cholesterol - Recommend to continue current regimen. Defer to provider for updated lipid panel.  Medication Management:  Due to patient's age > 80 years old, it is recommended to reduce Zyrtec  to 5 mg daily as patient has not tried dose reduction. Patient made aware and stated he probably should try. - Dicussed using a medication alarm for morning and evening medications (oral/injectable) to help improve adherence and patient is agreeable. - Encouraged patient to continue PT sessions when able to tolerate again    Follow Up Plan: 4-6 weeks with PharmD (telephone)  Dorcas Solian, PharmD Clinical Pharmacist Cell: [phone]

## 2023-09-01 ENCOUNTER — Encounter

## 2023-09-05 ENCOUNTER — Ambulatory Visit: Admitting: Physical Therapy

## 2023-09-07 ENCOUNTER — Ambulatory Visit: Admitting: Physical Therapy

## 2023-09-08 ENCOUNTER — Encounter

## 2023-09-08 NOTE — Addendum Note (Signed)
 Addended by: TAWNI DRILLING D on: 09/08/2023 02:48 PM   Modules accepted: Orders

## 2023-09-08 NOTE — Progress Notes (Signed)
 Bsx Loop Recorder

## 2023-09-12 ENCOUNTER — Encounter: Admitting: Physical Therapy

## 2023-09-14 ENCOUNTER — Encounter: Admitting: Physical Therapy

## 2023-09-29 ENCOUNTER — Encounter

## 2023-10-03 ENCOUNTER — Encounter

## 2023-10-13 ENCOUNTER — Encounter

## 2023-11-03 ENCOUNTER — Encounter

## 2023-11-07 ENCOUNTER — Other Ambulatory Visit: Payer: Self-pay

## 2023-11-07 MED ORDER — FREESTYLE LIBRE 3 SENSOR MISC: 3 refills | Status: AC

## 2023-11-17 ENCOUNTER — Encounter

## 2023-11-22 ENCOUNTER — Other Ambulatory Visit: Payer: Self-pay | Admitting: Physician Assistant

## 2023-12-01 ENCOUNTER — Other Ambulatory Visit: Payer: Self-pay | Admitting: Physician Assistant

## 2023-12-05 ENCOUNTER — Ambulatory Visit

## 2023-12-05 DIAGNOSIS — R55 Syncope and collapse: Secondary | ICD-10-CM

## 2023-12-08 ENCOUNTER — Encounter

## 2023-12-08 LAB — CUP PACEART REMOTE DEVICE CHECK
Date Time Interrogation Session: 20251022171100
Pulse Gen Serial Number: 109944

## 2023-12-09 NOTE — Progress Notes (Signed)
 Remote Loop Recorder Transmission

## 2023-12-12 ENCOUNTER — Ambulatory Visit: Payer: Self-pay | Admitting: Cardiology

## 2023-12-22 ENCOUNTER — Ambulatory Visit (INDEPENDENT_AMBULATORY_CARE_PROVIDER_SITE_OTHER): Payer: Medicare Other | Admitting: Physician Assistant

## 2023-12-22 ENCOUNTER — Telehealth: Payer: Self-pay | Admitting: Physician Assistant

## 2023-12-22 ENCOUNTER — Encounter

## 2023-12-22 ENCOUNTER — Encounter: Payer: Self-pay | Admitting: Physician Assistant

## 2023-12-22 VITALS — BP 129/53 | HR 78 | Temp 98.0°F | Resp 16 | Ht 68.0 in | Wt 205.0 lb

## 2023-12-22 DIAGNOSIS — Z0001 Encounter for general adult medical examination with abnormal findings: Secondary | ICD-10-CM

## 2023-12-22 DIAGNOSIS — E1122 Type 2 diabetes mellitus with diabetic chronic kidney disease: Secondary | ICD-10-CM

## 2023-12-22 DIAGNOSIS — Z794 Long term (current) use of insulin: Secondary | ICD-10-CM | POA: Diagnosis not present

## 2023-12-22 DIAGNOSIS — G4733 Obstructive sleep apnea (adult) (pediatric): Secondary | ICD-10-CM

## 2023-12-22 DIAGNOSIS — I493 Ventricular premature depolarization: Secondary | ICD-10-CM | POA: Diagnosis not present

## 2023-12-22 DIAGNOSIS — I70213 Atherosclerosis of native arteries of extremities with intermittent claudication, bilateral legs: Secondary | ICD-10-CM

## 2023-12-22 DIAGNOSIS — N1831 Chronic kidney disease, stage 3a: Secondary | ICD-10-CM | POA: Diagnosis not present

## 2023-12-22 NOTE — Telephone Encounter (Signed)
 Faxed lab results request to [facility]; [phone]

## 2023-12-22 NOTE — Progress Notes (Addendum)
 [facility] [address] [place], [place] [address]  Internal MEDICINE  Office Visit Note  Patient Name: Allen [name]  [id number]  [id number]  Date of Service: 12/22/2023  Chief Complaint  Patient presents with   Diabetes   Hypertension   Hyperlipidemia   Medicare Wellness    HPI Allen [name] presents for an annual well visit Well-appearing 83 y.o. male Eye exam and/or foot exam: eye exam UTD, followed by podiatry previously but nothing recently. He will call for follow up Labs: will request from the [place] Other concerns:  -seeing endo at the end of the month for labs -annual vascular appt soon and seeing cardiology regularly -wearing CPAP nightly     12/22/2023    2:01 PM 12/16/2022    2:03 PM 12/10/2021    2:11 PM  MMSE - Mini Mental State Exam  Orientation to time 5 4 5   Orientation to Place 5 5 5   Registration 3 3 3   Attention/ Calculation 5 5 5   Recall 3 3 3   Language- name 2 objects 2 2 2   Language- repeat 1 1 1   Language- follow 3 step command 3 3 3   Language- read & follow direction 1 1 1   Write a sentence 1 1 1   Copy design 1 1 1   Total score 30 29 30     Functional Status Survey: Is the patient deaf or have difficulty hearing?: No Does the patient have difficulty seeing, even when wearing glasses/contacts?: No Does the patient have difficulty concentrating, remembering, or making decisions?: No Does the patient have difficulty walking or climbing stairs?: No Does the patient have difficulty dressing or bathing?: No Does the patient have difficulty doing errands alone such as visiting a doctor's office or shopping?: No     12/10/2021    2:03 PM 01/23/2022    8:27 PM 06/17/2022   11:43 AM 12/16/2022    2:02 PM 12/22/2023    2:00 PM  Fall Risk  Falls in the past year? 0  1 0 1  Was there an injury with Fall?   1  0  Fall Risk Category Calculator   2  2  (RETIRED) Patient Fall Risk Level  Moderate fall risk      Patient at Risk for Falls Due to    History of fall(s)    Fall risk Follow up   Falls evaluation completed       Data saved with a previous flowsheet row definition       12/22/2023    2:00 PM  Depression screen PHQ 2/9  Decreased Interest 0  Down, Depressed, Hopeless 0  PHQ - 2 Score 0        No data to display            Current Medication: Outpatient Encounter Medications as of 12/22/2023  Medication Sig Note   aspirin 81 MG chewable tablet Chew by mouth daily.    b complex vitamins tablet Take 1 tablet by mouth daily.    B-D ULTRA-FINE 33 LANCETS MISC Use 1 Lancet 3 (three) times daily. 04/25/2023: Supply   BD PEN NEEDLE NANO 2ND GEN 32G X 4 MM MISC USE AS DIRECTED FOUR TIMES DAILY WITH LEVEMIR FLEXTOUCH AND HUMALOG  PEN 04/25/2023: Supply   cetirizine  (ZYRTEC ) 10 MG tablet Take 10 mg by mouth daily.    Cholecalciferol  (VITAMIN D3) 1000 units CAPS Take by mouth daily.    Continuous Glucose Sensor (FREESTYLE LIBRE 3 SENSOR) MISC Use as direct every 14 days.  empagliflozin  (JARDIANCE ) 25 MG TABS tablet Take 1 tablet (25 mg total) by mouth daily before breakfast.    ezetimibe -simvastatin  (VYTORIN ) 10-40 MG tablet TAKE 1 TABLET BY MOUTH DAILY    folic acid  (FOLVITE ) 400 MCG tablet Take 400 mcg by mouth daily.    Garlic 1000 MG CAPS Take by mouth.    glucose blood test strip Check blood sugar 5x a day as directed 04/25/2023: Supply   insulin  aspart (NOVOLOG) 100 UNIT/ML FlexPen INJECT 10 UNITS UNDER SKIN TWICE DAILY BEFORE MORNING AND EVENING MEAL FOR DIABETES.DISCARD PEN 28 DAYS AFTER OPENING PLUS SLIDING SCALE 07/13/2023: SSI 30- 40 units (reports mostly 40 units)   Insulin  Glargine (BASAGLAR  KWIKPEN) 100 UNIT/ML ADMINISTER 26 TO 40 UNITS UNDER THE SKIN EVERY DAY 07/13/2023: Patient reports taking 32 units daily   levothyroxine  (SYNTHROID ) 75 MCG tablet TAKE 1 TABLET BY MOUTH DAILY BEFORE BREAKFAST    meloxicam (MOBIC) 15 MG tablet Take 15 mg by mouth daily.    metFORMIN  (GLUCOPHAGE ) 500 MG tablet TAKE 1  TABLET(500 MG) BY MOUTH TWICE DAILY WITH A MEAL    metoprolol  succinate (TOPROL -XL) 25 MG 24 hr tablet TAKE 1/2 TABLET BY MOUTH EVERY DAY    Multiple Vitamin (MULTIVITAMIN WITH MINERALS) TABS tablet Take 1 tablet by mouth daily.    traMADol (ULTRAM) 50 MG tablet Take 100 mg by mouth daily.     valsartan  (DIOVAN ) 160 MG tablet TAKE 1 TABLET BY MOUTH DAILY    vitamin B-12 (CYANOCOBALAMIN ) 1000 MCG tablet Take 1,000 mcg by mouth daily.    No facility-administered encounter medications on file as of 12/22/2023.    Surgical History: Past Surgical History:  Procedure Laterality Date   CARDIAC CATHETERIZATION  1989   CARDIAC CATHETERIZATION  1990   GALLBLADDER SURGERY     LEG SURGERY Right    stent placement    Medical History: Past Medical History:  Diagnosis Date   Coronary artery disease    a. 1989 s/p MI w/ PTCA to unknown vessel. Reports cath 6 months later with occluded vessel and collaterals--> medically managed since..   Diabetes mellitus without complication (HCC)    Hyperlipidemia LDL goal <70    Hypertension    a. 02/2020 nl renal artery duplex.   MI (myocardial infarction) (HCC)    PAD (peripheral artery disease)    a. 12/2019 ABIs: R 1.02, L 0.56-->unchanged.   Sleep apnea     Family History: Family History  Problem Relation Age of Onset   Diabetes Mother    Brain cancer Mother    Throat cancer Father    Prostate cancer Brother     Social History   Socioeconomic History   Marital status: Married    Spouse name: Not on file   Number of children: Not on file   Years of education: Not on file   Highest education level: Not on file  Occupational History   Not on file  Tobacco Use   Smoking status: Former    Current packs/day: 3.00    Average packs/day: 3.0 packs/day for 32.0 years (96.0 ttl pk-yrs)    Types: Cigarettes   Smokeless tobacco: Never  Vaping Use   Vaping status: Never Used  Substance and Sexual Activity   Alcohol use: No   Drug use: No    Sexual activity: Not on file  Other Topics Concern   Not on file  Social History Narrative   Not on file   Social Drivers of Health   Financial Resource Strain:  Not on file  Food Insecurity: Not on file  Transportation Needs: Not on file  Physical Activity: Not on file  Stress: Not on file  Social Connections: Not on file  Intimate Partner Violence: Not on file      Review of Systems  Constitutional:  Negative for chills, fatigue and unexpected weight change.  HENT:  Negative for congestion, postnasal drip, rhinorrhea, sneezing and sore throat.   Eyes:  Negative for redness.  Respiratory:  Negative for cough, chest tightness and shortness of breath.   Cardiovascular:  Negative for chest pain and palpitations.  Gastrointestinal:  Negative for abdominal pain, constipation, diarrhea, nausea and vomiting.  Genitourinary:  Negative for dysuria and frequency.  Musculoskeletal:  Positive for arthralgias and gait problem. Negative for back pain, joint swelling and neck pain.  Skin:  Negative for rash.  Neurological:  Negative for tremors and numbness.  Hematological:  Negative for adenopathy. Does not bruise/bleed easily.  Psychiatric/Behavioral:  Negative for behavioral problems (Depression), sleep disturbance and suicidal ideas. The patient is not nervous/anxious.     Vital Signs: BP (!) 129/53   Pulse 78   Temp 98 F (36.7 C)   Resp 16   Ht 5' 8 (1.727 m)   Wt 205 lb (93 kg)   SpO2 96%   BMI 31.17 kg/m    Physical Exam Vitals and nursing note reviewed.  Constitutional:      Appearance: Normal appearance. He is obese.  HENT:     Head: Normocephalic and atraumatic.  Cardiovascular:     Rate and Rhythm: Normal rate and regular rhythm.  Pulmonary:     Effort: Pulmonary effort is normal.     Breath sounds: Normal breath sounds.  Feet:     Right foot:     Protective Sensation: 3 sites tested.  0 sites sensed.     Toenail Condition: Right toenails are abnormally  thick and long.     Left foot:     Protective Sensation: 3 sites tested.  0 sites sensed.     Toenail Condition: Left toenails are abnormally thick and long.  Skin:    General: Skin is warm and dry.  Neurological:     Mental Status: He is alert.  Psychiatric:        Mood and Affect: Mood normal.        Behavior: Behavior normal.        Assessment/Plan: 1. Encounter for Medicare annual examination with abnormal findings (Primary) AWV performed  2. Type 2 diabetes mellitus with stage 3a chronic kidney disease, with long-term current use of insulin  (HCC) Labs done by endo through the [facility] and will request copies. Patient will also schedule follow up with podiatry  3. Atherosclerosis of native artery of both lower extremities with intermittent claudication Will be seeing vascular soon  4. PVC's (premature ventricular contractions) Followed by cardiology  5. OSA on CPAP Continue CPAP nightly   General Counseling: issachar broady understanding of the findings of todays visit and agrees with plan of treatment. I have discussed any further diagnostic evaluation that may be needed or ordered today. We also reviewed his medications today. he has been encouraged to call the office with any questions or concerns that should arise related to todays visit.    No orders of the defined types were placed in this encounter.   No orders of the defined types were placed in this encounter.   Return in about 1 year (around 12/21/2024) for CPE, need to request  recent labs from the [facility].   Total time spent:30 Minutes Time spent includes review of chart, medications, test results, and follow up plan with the patient.   [place] Controlled Substance Database was reviewed by me.  This patient was seen by Tinnie Pro, PA-C in collaboration with Dr. Sigrid Bathe as a part of collaborative care agreement.  Tinnie Pro, PA-C Internal medicine

## 2023-12-27 ENCOUNTER — Ambulatory Visit: Admitting: Internal Medicine

## 2023-12-28 ENCOUNTER — Ambulatory Visit: Payer: Medicare Other | Admitting: Nurse Practitioner

## 2024-01-02 LAB — COMPREHENSIVE METABOLIC PANEL WITH GFR: eGFR: 43

## 2024-01-02 LAB — MICROALBUMIN, URINE: Microalb, Ur: <0.3

## 2024-01-05 ENCOUNTER — Other Ambulatory Visit (INDEPENDENT_AMBULATORY_CARE_PROVIDER_SITE_OTHER): Payer: Medicare Other

## 2024-01-05 ENCOUNTER — Encounter (INDEPENDENT_AMBULATORY_CARE_PROVIDER_SITE_OTHER): Payer: Self-pay | Admitting: Vascular Surgery

## 2024-01-05 ENCOUNTER — Encounter

## 2024-01-05 ENCOUNTER — Ambulatory Visit (INDEPENDENT_AMBULATORY_CARE_PROVIDER_SITE_OTHER): Payer: Medicare Other | Admitting: Vascular Surgery

## 2024-01-05 VITALS — BP 122/72 | HR 102 | Resp 16 | Ht 68.0 in | Wt 203.2 lb

## 2024-01-05 DIAGNOSIS — I70213 Atherosclerosis of native arteries of extremities with intermittent claudication, bilateral legs: Secondary | ICD-10-CM

## 2024-01-05 DIAGNOSIS — E785 Hyperlipidemia, unspecified: Secondary | ICD-10-CM

## 2024-01-05 DIAGNOSIS — E1165 Type 2 diabetes mellitus with hyperglycemia: Secondary | ICD-10-CM | POA: Diagnosis not present

## 2024-01-05 DIAGNOSIS — I10 Essential (primary) hypertension: Secondary | ICD-10-CM

## 2024-01-09 ENCOUNTER — Encounter (INDEPENDENT_AMBULATORY_CARE_PROVIDER_SITE_OTHER): Payer: Self-pay | Admitting: Vascular Surgery

## 2024-01-09 LAB — VAS US ABI WITH/WO TBI
Left ABI: 1.02
Right ABI: 1.17

## 2024-01-09 NOTE — Progress Notes (Signed)
 Subjective:    Patient ID: Allen [name], male    DOB: 11-30-1940, 83 y.o.   MRN: [id number] Chief Complaint  Patient presents with   Follow-up    1 year ABI    Allen [name] is a 83 y.o. ([birth year]) male who presents with chief complaint of check circulation.   History of Present Illness:    The patient returns to the office for followup and review of the noninvasive studies. There have been no interval changes in lower extremity symptoms. No interval shortening of the patient's claudication distance or development of rest pain symptoms. No new ulcers or wounds have occurred since the last visit.   There have been no significant changes to the patient's overall health care.   The patient denies amaurosis fugax or recent TIA symptoms. There are no recent neurological changes noted. The patient denies history of DVT, PE or superficial thrombophlebitis. The patient denies recent episodes of angina or shortness of breath.    ABI Rt=1.17 and Lt=1.02  (previous ABI's Rt=1.01 and Lt=0.56)     Review of Systems  Constitutional: Negative.   Cardiovascular:  Positive for leg swelling.  Musculoskeletal:  Positive for myalgias.  All other systems reviewed and are negative.      Objective:   Physical Exam Constitutional:      Appearance: Normal appearance. He is obese.  HENT:     Head: Normocephalic and atraumatic.  Eyes:     Pupils: Pupils are equal, round, and reactive to light.  Cardiovascular:     Rate and Rhythm: Normal rate and regular rhythm.     Pulses: Normal pulses.     Heart sounds: Normal heart sounds.  Pulmonary:     Effort: Pulmonary effort is normal.     Breath sounds: Normal breath sounds.  Abdominal:     General: Abdomen is flat. Bowel sounds are normal.     Palpations: Abdomen is soft.  Musculoskeletal:        General: Normal range of motion.  Skin:    General: Skin is warm and dry.     Capillary Refill: Capillary refill takes 2 to 3 seconds.  Neurological:      General: No focal deficit present.     Mental Status: He is alert and oriented to person, place, and time. Mental status is at baseline.  Psychiatric:        Mood and Affect: Mood normal.        Behavior: Behavior normal.        Thought Content: Thought content normal.        Judgment: Judgment normal.     BP 122/72   Pulse (!) 102   Resp 16   Ht 5' 8 (1.727 m)   Wt 203 lb 3.2 oz (92.2 kg)   BMI 30.90 kg/m   Past Medical History:  Diagnosis Date   Coronary artery disease    a. 1989 s/p MI w/ PTCA to unknown vessel. Reports cath 6 months later with occluded vessel and collaterals--> medically managed since..   Diabetes mellitus without complication (HCC)    Hyperlipidemia LDL goal <70    Hypertension    a. 02/2020 nl renal artery duplex.   MI (myocardial infarction) (HCC)    PAD (peripheral artery disease)    a. 12/2019 ABIs: R 1.02, L 0.56-->unchanged.   Sleep apnea     Social History   Socioeconomic History   Marital status: Married    Spouse name: Not on file  Number of children: Not on file   Years of education: Not on file   Highest education level: Not on file  Occupational History   Not on file  Tobacco Use   Smoking status: Former    Current packs/day: 3.00    Average packs/day: 3.0 packs/day for 32.0 years (96.0 ttl pk-yrs)    Types: Cigarettes   Smokeless tobacco: Never  Vaping Use   Vaping status: Never Used  Substance and Sexual Activity   Alcohol use: No   Drug use: No   Sexual activity: Not on file  Other Topics Concern   Not on file  Social History Narrative   Not on file   Social Drivers of Health   Financial Resource Strain: Not on file  Food Insecurity: Not on file  Transportation Needs: Not on file  Physical Activity: Not on file  Stress: Not on file  Social Connections: Not on file  Intimate Partner Violence: Not on file    Past Surgical History:  Procedure Laterality Date   CARDIAC CATHETERIZATION  1989   CARDIAC  CATHETERIZATION  1990   GALLBLADDER SURGERY     LEG SURGERY Right    stent placement    Family History  Problem Relation Age of Onset   Diabetes Mother    Brain cancer Mother    Throat cancer Father    Prostate cancer Brother     Allergies  Allergen Reactions   Iodinated Contrast Media Anaphylaxis and Rash    TOLERATED CT CONTRAST ON 01/07/2018 WITH NO ISSUES AFTER 10 MINUTE PRETREATMENT X-Ray contrast dyes- anaphylaxis & rash X-Ray contrast dyes- anaphylaxis & rash  X-Ray contrast dyes- anaphylaxis & rash TOLERATED CT CONTRAST ON 01/07/2018 WITH NO ISSUES AFTER 10 MINUTE PRETREATMENT X-Ray contrast dyes- anaphylaxis & rash X-Ray contrast dyes- anaphylaxis & rash   Iohexol Hives and Shortness Of Breath    Pt states that he had IV dye for a kidney x-ray years ago and after injection of the dye he had hives all over his body, throat swelling, and SOB. Pt states that he had IV dye for a kidney x-ray years ago and after injection of the dye he had hives all over his body, throat swelling, and SOB. Pt states that he had IV dye for a kidney x-ray years ago and after injection of the dye he had hives all over his body, throat swelling, and SOB.   Liraglutide Nausea And Vomiting   Duloxetine Other (See Comments)    Sick to stomach and chills   Gabapentin Other (See Comments)   Penicillins Other (See Comments)   Other Rash       Latest Ref Rng & Units 01/11/2023    3:33 AM 12/16/2022    3:03 PM 01/23/2022    7:53 PM  CBC  WBC 4.0 - 10.5 K/uL 10.0  4.9  6.4   Hemoglobin 13.0 - 17.0 g/dL 85.7  85.7  86.0   Hematocrit 39.0 - 52.0 % 42.9  43.3  43.4   Platelets 150 - 400 K/uL 213  228  210       CMP     Component Value Date/Time   NA 138 01/11/2023 0333   NA 139 12/11/2021 1150   NA 132 (L) 10/04/2011 0756   K 3.8 01/11/2023 0333   K 4.3 10/04/2011 0756   CL 96 (L) 01/11/2023 0333   CL 95 (L) 10/04/2011 0756   CO2 27 01/11/2023 0333   CO2 29 10/04/2011 0756  GLUCOSE  217 (H) 01/11/2023 0333   GLUCOSE 182 (H) 10/04/2011 0756   BUN 37 (H) 01/11/2023 0333   BUN 32 (H) 12/11/2021 1150   BUN 23 (H) 10/04/2011 0756   CREATININE 1.73 (H) 01/11/2023 0333   CREATININE 0.97 10/04/2011 0756   CALCIUM 9.5 01/11/2023 0333   CALCIUM 9.5 10/04/2011 0756   PROT 7.4 01/11/2023 0333   PROT 6.8 12/11/2021 1150   ALBUMIN 4.2 01/11/2023 0333   ALBUMIN 4.2 12/11/2021 1150   AST 23 01/11/2023 0333   ALT 20 01/11/2023 0333   ALKPHOS 62 01/11/2023 0333   BILITOT 0.9 01/11/2023 0333   BILITOT 1.2 12/11/2021 1150   EGFR 51 (L) 12/11/2021 1150   GFRNONAA 39 (L) 01/11/2023 0333     VAS US  ABI WITH/WO TBI Result Date: 01/09/2024  LOWER EXTREMITY DOPPLER STUDY Patient Name:  ROB MCIVER  Date of Exam:   01/05/2024 Medical Rec #: [id number]   Accession #:    [id number] Date of Birth: 1940-06-10    Patient Gender: M Patient Age:   66 years Exam Location:  [facility] Procedure:      VAS US  ABI WITH/WO TBI Referring Phys: [facility] --------------------------------------------------------------------------------  Indications: Peripheral artery disease. High Risk Factors: Hypertension, hyperlipidemia, coronary artery disease.  Comparison Study: 12/2022 Performing Technologist: Jerel Croak RVT  Examination Guidelines: A complete evaluation includes at minimum, Doppler waveform signals and systolic blood pressure reading at the level of bilateral brachial, anterior tibial, and posterior tibial arteries, when vessel segments are accessible. Bilateral testing is considered an integral part of a complete examination. Photoelectric Plethysmograph (PPG) waveforms and toe systolic pressure readings are included as required and additional duplex testing as needed. Limited examinations for reoccurring indications may be performed as noted.  ABI Findings: +---------+------------------+-----+---------+--------+ Right    Rt Pressure (mmHg)IndexWaveform Comment   +---------+------------------+-----+---------+--------+ Brachial 132                                      +---------+------------------+-----+---------+--------+ PTA      155               1.17 triphasic         +---------+------------------+-----+---------+--------+ DP       129               0.98 triphasic         +---------+------------------+-----+---------+--------+ Great Toe74                0.56 Normal            +---------+------------------+-----+---------+--------+ +---------+------------------+-----+----------+-------+ Left     Lt Pressure (mmHg)IndexWaveform  Comment +---------+------------------+-----+----------+-------+ Brachial 130                                      +---------+------------------+-----+----------+-------+ PTA      134               1.02 monophasic        +---------+------------------+-----+----------+-------+ DP       86                0.65 monophasic        +---------+------------------+-----+----------+-------+ Great Toe56                0.42 Abnormal          +---------+------------------+-----+----------+-------+ +-------+-----------+-----------+------------+------------+  ABI/TBIToday's ABIToday's TBIPrevious ABIPrevious TBI +-------+-----------+-----------+------------+------------+ Right  1.17       .56        1.01        .75          +-------+-----------+-----------+------------+------------+ Left   1.02       .42        .56         .29          +-------+-----------+-----------+------------+------------+  Bilateral ABIs and TBIs appear increased compared to prior study on 12/2022.  Summary: Right: Resting right ankle-brachial index is within normal range. The right toe-brachial index is abnormal.  Left: Resting left ankle-brachial index is within normal range. The left toe-brachial index is abnormal.  *See table(s) above for measurements and observations.  Electronically signed by Cordella Shawl MD on  01/09/2024 at 10:06:27 AM.    Final        Assessment & Plan:   1. Atherosclerosis of native artery of both lower extremities with intermittent claudication (Primary)  Recommend:  The patient has evidence of atherosclerosis of the lower extremities with claudication.  The patient does not voice lifestyle limiting changes at this point in time.  Noninvasive studies do not suggest clinically significant change.  No invasive studies, angiography or surgery at this time The patient should continue walking and begin a more formal exercise program.  The patient should continue antiplatelet therapy and aggressive treatment of the lipid abnormalities  No changes in the patient's medications at this time  Continued surveillance is indicated as atherosclerosis is likely to progress with time.    The patient will continue follow up with noninvasive studies as ordered.   VAS US  ABI WITH/WO TBI; Future   2. Essential hypertension Continue antihypertensive medications as already ordered, these medications have been reviewed and there are no changes at this time.  3. Type 2 diabetes mellitus with hyperglycemia, without long-term current use of insulin  (HCC) Continue hypoglycemic medications as already ordered, these medications have been reviewed and there are no changes at this time.  Hgb A1C to be monitored as already arranged by primary service  4. Hyperlipidemia, unspecified hyperlipidemia type Continue statin as ordered and reviewed, no changes at this time   Current Outpatient Medications on File Prior to Visit  Medication Sig Dispense Refill   aspirin 81 MG chewable tablet Chew by mouth daily.     b complex vitamins tablet Take 1 tablet by mouth daily.     B-D ULTRA-FINE 33 LANCETS MISC Use 1 Lancet 3 (three) times daily.     BD PEN NEEDLE NANO 2ND GEN 32G X 4 MM MISC USE AS DIRECTED FOUR TIMES DAILY WITH LEVEMIR FLEXTOUCH AND HUMALOG  PEN 100 each 3   cetirizine  (ZYRTEC ) 10 MG  tablet Take 10 mg by mouth daily.     Cholecalciferol  (VITAMIN D3) 1000 units CAPS Take by mouth daily.     Continuous Glucose Sensor (FREESTYLE LIBRE 3 SENSOR) MISC Use as direct every 14 days. 2 each 3   empagliflozin  (JARDIANCE ) 25 MG TABS tablet Take 1 tablet (25 mg total) by mouth daily before breakfast. 90 tablet 3   ezetimibe -simvastatin  (VYTORIN ) 10-40 MG tablet TAKE 1 TABLET BY MOUTH DAILY 90 tablet 0   folic acid  (FOLVITE ) 400 MCG tablet Take 400 mcg by mouth daily.     Garlic 1000 MG CAPS Take by mouth.     glucose blood test strip Check blood sugar 5x a day as directed  insulin  aspart (NOVOLOG) 100 UNIT/ML FlexPen INJECT 10 UNITS UNDER SKIN TWICE DAILY BEFORE MORNING AND EVENING MEAL FOR DIABETES.DISCARD PEN 28 DAYS AFTER OPENING PLUS SLIDING SCALE     Insulin  Glargine (BASAGLAR  KWIKPEN) 100 UNIT/ML ADMINISTER 26 TO 40 UNITS UNDER THE SKIN EVERY DAY 15 mL 3   levothyroxine  (SYNTHROID ) 75 MCG tablet TAKE 1 TABLET BY MOUTH DAILY BEFORE BREAKFAST 90 tablet 3   metFORMIN  (GLUCOPHAGE ) 500 MG tablet TAKE 1 TABLET(500 MG) BY MOUTH TWICE DAILY WITH A MEAL 180 tablet 1   metoprolol  succinate (TOPROL -XL) 25 MG 24 hr tablet TAKE 1/2 TABLET BY MOUTH EVERY DAY 90 tablet 1   Multiple Vitamin (MULTIVITAMIN WITH MINERALS) TABS tablet Take 1 tablet by mouth daily.     traMADol (ULTRAM) 50 MG tablet Take 100 mg by mouth daily.      valsartan  (DIOVAN ) 160 MG tablet TAKE 1 TABLET BY MOUTH DAILY 90 tablet 0   vitamin B-12 (CYANOCOBALAMIN ) 1000 MCG tablet Take 1,000 mcg by mouth daily.     meloxicam (MOBIC) 15 MG tablet Take 15 mg by mouth daily. (Patient not taking: Reported on 01/05/2024)     No current facility-administered medications on file prior to visit.    There are no Patient Instructions on file for this visit. No follow-ups on file.   Gwendlyn JONELLE Shank, NP

## 2024-01-16 ENCOUNTER — Encounter

## 2024-01-18 ENCOUNTER — Encounter: Payer: Self-pay | Admitting: Cardiology

## 2024-01-18 ENCOUNTER — Ambulatory Visit: Attending: Cardiology | Admitting: Cardiology

## 2024-01-18 VITALS — BP 116/64 | HR 79 | Ht 68.0 in | Wt 206.0 lb

## 2024-01-18 DIAGNOSIS — I2510 Atherosclerotic heart disease of native coronary artery without angina pectoris: Secondary | ICD-10-CM | POA: Insufficient documentation

## 2024-01-18 DIAGNOSIS — I10 Essential (primary) hypertension: Secondary | ICD-10-CM | POA: Insufficient documentation

## 2024-01-18 DIAGNOSIS — E782 Mixed hyperlipidemia: Secondary | ICD-10-CM | POA: Insufficient documentation

## 2024-01-18 DIAGNOSIS — I493 Ventricular premature depolarization: Secondary | ICD-10-CM | POA: Diagnosis not present

## 2024-01-18 DIAGNOSIS — E1165 Type 2 diabetes mellitus with hyperglycemia: Secondary | ICD-10-CM | POA: Insufficient documentation

## 2024-01-18 DIAGNOSIS — I739 Peripheral vascular disease, unspecified: Secondary | ICD-10-CM | POA: Diagnosis not present

## 2024-01-18 DIAGNOSIS — G4733 Obstructive sleep apnea (adult) (pediatric): Secondary | ICD-10-CM | POA: Diagnosis not present

## 2024-01-18 NOTE — Patient Instructions (Signed)
 Medication Instructions:  Your physician recommends that you continue on your current medications as directed. Please refer to the Current Medication list given to you today.   *If you need a refill on your cardiac medications before your next appointment, please call your pharmacy*  Lab Work: No labs ordered today  If you have labs (blood work) drawn today and your tests are completely normal, you will receive your results only by: MyChart Message (if you have MyChart) OR A paper copy in the mail If you have any lab test that is abnormal or we need to change your treatment, we will call you to review the results.  Testing/Procedures: No test ordered today   Follow-Up: At [facility], you and your health needs are our priority.  As part of our continuing mission to provide you with exceptional heart care, our providers are all part of one team.  This team includes your primary Cardiologist (physician) and Advanced Practice Providers or APPs (Physician Assistants and Nurse Practitioners) who all work together to provide you with the care you need, when you need it.  Your next appointment:   6 month(s)  Provider:   You may see Deatrice Cage, MD or one of the following Advanced Practice Providers on your designated Care Team:   Tylene Lunch, NP

## 2024-01-18 NOTE — Progress Notes (Signed)
 Cardiology Office Note   Date:  01/18/2024  ID:  Duval Macleod, DOB [birth year], MRN [id number] PCP: Kristina Tinnie POUR, PA-C  [facility] Providers Cardiologist:  Deatrice Cage, MD Cardiology APP:  Gerard Frederick, NP  Electrophysiologist:  OLE ONEIDA HOLTS, MD     History of Present Illness Mrk Buzby is a 83 y.o. male with past medical history of coronary artery disease status post MI (1989), hypertension, hyperlipidemia, type 2 diabetes, obesity, peripheral arterial disease, sleep apnea, recurrent syncope, PVCs, who is here today for follow-up.   History of coronary artery disease status post MI in 1989 treated with angioplasty at [facility].  Repeat catheterization approximately 6 months later showed an occluded vessel with collaterals and he has been medically managed since this time.  Hospitalized 06/2020 for symptomatic bradycardia.  During visit rates were recorded to be 30 bpm.  While in the emergency department he was found to have a heart rate of 59 bpm was completely asymptomatic and was found to have PVCs on EKG.  Echocardiogram completed in July 2022 revealed LVEF 55 to 2%, no RWMA, mildly LVH, G1 DD, moderate aortic valve sclerosis without significant aortic valve stenosis.  Evaluated by Dr. Holts in 09/2020 for PVCs and bradycardia.  He continued to remain asymptomatic and the decision was made to continue with conservative management strategy.  He was evaluated in the [facility] emergency department with weakness and had an episode of blacking out.  When EMS arrived blood pressure was in the 60s and heart rate was in the 40s.  He was given 500 mL fluid bolus with improvement in heart rate and blood pressure.  He had returned to his baseline and was discharged home with cardiac follow-up.  He was evaluated in clinic 01/2022 where he continued to endorse worsening weakness, fatigue, and shortness of breath with dyspnea on exertion occasional changes in his vision with blurry  vision.  Lexiscan  Myoview  showed evidence of small inferior infarct without significant ischemia.  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  Minimum heart rate was 43 bpm recorded in the afternoon.  He was evaluated in clinic 01/10/2023 in review of his previously implanted ILR revealed a PVC burden of approximately 25%.  He had had no further syncopal episodes was continued on metoprolol  12.5 mg daily.  He was then seen in clinic 06/28/2023.  Stated that he had been doing well from a cardiac perspective.  Had complained of back pain.  He continued to have episodes of bradycardia although his heart rate on exam was 60.  He was continued on his current medication regimen and no further testing was ordered at that time.  He was last seen in clinic 07/06/2023 by Dr. Holts.  He was doing well.  Continued to have back pain for which she was started in physical therapy.  He was continued on metoprolol .  Hypertension remained at goal.  There was no clear cause for his syncope and he continued to have his ILR monitor.  There were no medication changes that were made and further testing that was ordered.    He returns to clinic today accompanied by his wife.  He states a cardiac perspective he has been doing well.  Denies any chest pain or shortness of breath with occasional peripheral edema.  States that he has been compliant with his current medication regimen without any undue side effects.  Recently has followed up with vein and vascular.  Denies any recent hospitalizations or visits to  the emergency department.  ROS: 10 point review of system has been reviewed and considered negative the exception was been listed in HPI  Studies Reviewed EKG Interpretation Date/Time:  Wednesday January 18 2024 14:40:54 EST Ventricular Rate:  79 PR Interval:    QRS Duration:  76 QT Interval:  330 QTC Calculation: 378 R Axis:   20  Text Interpretation: Sinus rhythm Artifact When compared  with ECG of 28-Jun-2023 15:40, No significant change since last tracing Confirmed by Gerard Frederick ([id number]) on 01/18/2024 2:49:43 PM    TTE, 12/30/2022  1. Left ventricular ejection fraction, by estimation, is 55 to 60%. The left ventricle has normal function. The left ventricle has no regional wall motion abnormalities. There is mild left ventricular hypertrophy. Left ventricular diastolic parameters are consistent with Grade I diastolic dysfunction (impaired relaxation). The average left ventricular global longitudinal strain is -16.1 %.   2. Right ventricular systolic function is normal. The right ventricular size is normal.   3. The mitral valve is normal in structure. Mild mitral valve regurgitation. No evidence of mitral stenosis.   4. The aortic valve has an indeterminant number of cusps. There is moderate calcification of the aortic valve. Aortic valve regurgitation is not visualized. Valve gradient measurements not obtained.   5. The inferior vena cava is normal in size with greater than 50% respiratory variability, suggesting right atrial pressure of 3 mmHg.    Long term monitor, 03/09/2022 Patient had a min HR of 30 bpm, max HR of 125 bpm, and avg HR of 55 bpm. Predominant underlying rhythm was Sinus Rhythm. First Degree AV Block was present. 1 run of Supraventricular Tachycardia occurred lasting 34.3 secs with a max rate of 125 bpm (avg  112 bpm). Episode of Supraventricular Tachycardia may be possible Atrial Tachycardia with variable block. Second Degree AV Block-Mobitz I (Wenckebach) was present.  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 Minimal sinus heart rate of 43 bpm happened at 3:42 PM   NM myocardial, 02/11/2022   Findings are consistent with prior myocardial infarction. The study is low risk.   No ST deviation was noted.   LV perfusion is abnormal. There is no evidence of ischemia. There is evidence of infarction. Defect 1: There is a small defect with severe reduction in uptake  present in the basal inferior location(s) that is fixed. There is abnormal wall motion in the defect area. Consistent with infarction.   Left ventricular function is normal. End diastolic cavity size is normal. End systolic cavity size is normal.   There is evidence of prior small infarct in the basal inferior wall without ischemia.   CT attenuation images showed moderate aortic and coronary artery calcifications.   TTE, 09/02/2020  1. Left ventricular ejection fraction, by estimation, is 55 to 60%. The left ventricle has normal function. The left ventricle has no regional wall motion abnormalities. There is mild left ventricular hypertrophy. Left ventricular diastolic parameters are consistent with Grade I diastolic dysfunction (impaired relaxation).   2. Right ventricular systolic function is normal. The right ventricular size is normal.   3. The mitral valve is normal in structure. No evidence of mitral valve regurgitation.   4. The aortic valve was not well visualized. Aortic valve regurgitation is not visualized. Mild to moderate aortic valve sclerosis/calcification is present, without any evidence of aortic stenosis.   5. The inferior vena cava is normal in size with greater than 50% respiratory variability, suggesting right atrial pressure of 3 mmHg.   Risk Assessment/Calculations  Physical Exam VS:  BP 116/64   Pulse 79   Ht 5' 8 (1.727 m)   Wt 206 lb (93.4 kg)   SpO2 97%   BMI 31.32 kg/m        Wt Readings from Last 3 Encounters:  01/18/24 206 lb (93.4 kg)  01/05/24 203 lb 3.2 oz (92.2 kg)  12/22/23 205 lb (93 kg)    GEN: Well nourished, well developed in no acute distress NECK: No JVD; No carotid bruits CARDIAC: RRR, II/VI systolic murmur , rubs, gallops RESPIRATORY:  Clear to auscultation without rales, wheezing or rhonchi  ABDOMEN: Soft, non-tender, non-distended EXTREMITIES:  No edema; No deformity   ASSESSMENT AND PLAN Coronary artery disease of native  coronary arteries without angina.  He continues to deny angina or anginal equivalents.  EKG today reveals sinus rhythm with a rate of 79 with a large quantity of artifact with no significant change from prior studies.  He is continued on aspirin 81 mg daily and ezetimibe  and simvastatin  10/40 mg daily.  No further ischemic evaluation needed at this time.  Primary hypertension with a blood pressure today of 116/64.  Blood pressures have remained well-controlled.  He has continued on valsartan  160 mg daily, Toprol -XL 12.5 mg daily.  He has been encouraged to continue to monitor his pressures 1 to 2 hours postmedication administration at home as well.  Mixed hyperlipidemia with last LDL of 43 which remains at goal of less than 55.  He is continued on ezetimibe  simvastatin  10/40 mg daily.  We have requested his most recent labs from the [facility].  Type 2 diabetes which he states his sugars continue to be elevated over 200.  Last hemoglobin A1c was 8.2.  We have requested labs from the [facility].  He is continued on all of his diabetes medication with ongoing management per his PCP.  Peripheral arterial disease we recently followed with vascular and had ABIs completed which showed resting right and left ABI within normal range with the toe brachial index being abnormal on both.  Ongoing management per [facility].  Obstructive sleep apnea where he continues to be compliant with CPAP.  Obesity with a BMI of 31.32.  Would benefit from weight loss and has been slowly working on reducing his weight with dietary changes.  PVCs and a history of syncope where he has an ILR that was last checked 12/07/2023 which showed no arrhythmias and a normal histogram.  Ongoing management per EP       Dispo: Patient to return to clinic to see MD/APP in 6 months or sooner if needed for reevaluation  Signed, Trejan Buda, NP

## 2024-01-26 ENCOUNTER — Encounter

## 2024-02-05 ENCOUNTER — Ambulatory Visit: Attending: Physician Assistant

## 2024-02-07 ENCOUNTER — Encounter: Payer: Self-pay | Admitting: Internal Medicine

## 2024-02-07 ENCOUNTER — Ambulatory Visit: Admitting: Internal Medicine

## 2024-02-07 VITALS — BP 146/54 | HR 73 | Temp 98.0°F | Resp 16 | Ht 68.0 in | Wt 201.0 lb

## 2024-02-07 DIAGNOSIS — G4733 Obstructive sleep apnea (adult) (pediatric): Secondary | ICD-10-CM | POA: Diagnosis not present

## 2024-02-07 DIAGNOSIS — Z7189 Other specified counseling: Secondary | ICD-10-CM | POA: Diagnosis not present

## 2024-02-07 NOTE — Patient Instructions (Signed)

## 2024-02-07 NOTE — Progress Notes (Signed)
 [facility] [address] [place], [place] [address]  Pulmonary Sleep Medicine   Office Visit Note  Patient Name: Allen [name] DOB: 08-25-1940 MRN [id number]  Date of Service: 02/07/2024  Complaints/HPI: Patient is here for follow-up post suction drip for sleep apnea.  He is home doing relatively well he has been using his CPAP machine he states he sleeps over 13 hours a day but he uses the machine every time.  He has been 100% compliance.  His index is less than 5.  He states he received a message from his machine that the motor is about to quit.  He needs a new CPAP device.  Office Spirometry Results:     ROS  General: (-) fever, (-) chills, (-) night sweats, (-) weakness Skin: (-) rashes, (-) itching,. Eyes: (-) visual changes, (-) redness, (-) itching. Nose and Sinuses: (-) nasal stuffiness or itchiness, (-) postnasal drip, (-) nosebleeds, (-) sinus trouble. Mouth and Throat: (-) sore throat, (-) hoarseness. Neck: (-) swollen glands, (-) enlarged thyroid , (-) neck pain. Respiratory: - cough, (-) bloody sputum, - shortness of breath, - wheezing. Cardiovascular: - ankle swelling, (-) chest pain. Lymphatic: (-) lymph node enlargement. Neurologic: (-) numbness, (-) tingling. Psychiatric: (-) anxiety, (-) depression   Current Medication: Outpatient Encounter Medications as of 02/07/2024  Medication Sig Note   aspirin 81 MG chewable tablet Chew by mouth daily.    b complex vitamins tablet Take 1 tablet by mouth daily.    B-D ULTRA-FINE 33 LANCETS MISC Use 1 Lancet 3 (three) times daily. 04/25/2023: Supply   BD PEN NEEDLE NANO 2ND GEN 32G X 4 MM MISC USE AS DIRECTED FOUR TIMES DAILY WITH LEVEMIR FLEXTOUCH AND HUMALOG  PEN 04/25/2023: Supply   cetirizine  (ZYRTEC ) 10 MG tablet Take 10 mg by mouth daily. 01/05/2024: Taking every other day   Cholecalciferol  (VITAMIN D3) 1000 units CAPS Take by mouth daily.    Continuous Glucose Sensor (FREESTYLE LIBRE 3 SENSOR) MISC Use as  direct every 14 days.    empagliflozin  (JARDIANCE ) 25 MG TABS tablet Take 1 tablet (25 mg total) by mouth daily before breakfast.    ezetimibe -simvastatin  (VYTORIN ) 10-40 MG tablet TAKE 1 TABLET BY MOUTH DAILY    folic acid  (FOLVITE ) 400 MCG tablet Take 400 mcg by mouth daily.    Garlic 1000 MG CAPS Take by mouth.    glucose blood test strip Check blood sugar 5x a day as directed 04/25/2023: Supply   insulin  aspart (NOVOLOG) 100 UNIT/ML FlexPen INJECT 10 UNITS UNDER SKIN TWICE DAILY BEFORE MORNING AND EVENING MEAL FOR DIABETES.DISCARD PEN 28 DAYS AFTER OPENING PLUS SLIDING SCALE 07/13/2023: SSI 30- 40 units (reports mostly 40 units)   Insulin  Glargine (BASAGLAR  KWIKPEN) 100 UNIT/ML ADMINISTER 26 TO 40 UNITS UNDER THE SKIN EVERY DAY 07/13/2023: Patient reports taking 32 units daily   levothyroxine  (SYNTHROID ) 75 MCG tablet TAKE 1 TABLET BY MOUTH DAILY BEFORE BREAKFAST    meloxicam (MOBIC) 15 MG tablet Take 15 mg by mouth daily.    metFORMIN  (GLUCOPHAGE ) 500 MG tablet TAKE 1 TABLET(500 MG) BY MOUTH TWICE DAILY WITH A MEAL    metoprolol  succinate (TOPROL -XL) 25 MG 24 hr tablet TAKE 1/2 TABLET BY MOUTH EVERY DAY    Multiple Vitamin (MULTIVITAMIN WITH MINERALS) TABS tablet Take 1 tablet by mouth daily.    traMADol (ULTRAM) 50 MG tablet Take 100 mg by mouth daily.     valsartan  (DIOVAN ) 160 MG tablet TAKE 1 TABLET BY MOUTH DAILY    vitamin B-12 (  CYANOCOBALAMIN ) 1000 MCG tablet Take 1,000 mcg by mouth daily.    No facility-administered encounter medications on file as of 02/07/2024.    Surgical History: Past Surgical History:  Procedure Laterality Date   CARDIAC CATHETERIZATION  1989   CARDIAC CATHETERIZATION  1990   GALLBLADDER SURGERY     LEG SURGERY Right    stent placement    Medical History: Past Medical History:  Diagnosis Date   Coronary artery disease    a. 1989 s/p MI w/ PTCA to unknown vessel. Reports cath 6 months later with occluded vessel and collaterals--> medically managed  since..   Diabetes mellitus without complication (HCC)    Hyperlipidemia LDL goal <70    Hypertension    a. 02/2020 nl renal artery duplex.   MI (myocardial infarction) (HCC)    PAD (peripheral artery disease)    a. 12/2019 ABIs: R 1.02, L 0.56-->unchanged.   Sleep apnea     Family History: Family History  Problem Relation Age of Onset   Diabetes Mother    Brain cancer Mother    Throat cancer Father    Prostate cancer Brother     Social History: Social History   Socioeconomic History   Marital status: Married    Spouse name: Not on file   Number of children: Not on file   Years of education: Not on file   Highest education level: Not on file  Occupational History   Not on file  Tobacco Use   Smoking status: Former    Current packs/day: 3.00    Average packs/day: 3.0 packs/day for 32.0 years (96.0 ttl pk-yrs)    Types: Cigarettes   Smokeless tobacco: Never  Vaping Use   Vaping status: Never Used  Substance and Sexual Activity   Alcohol use: No   Drug use: No   Sexual activity: Not on file  Other Topics Concern   Not on file  Social History Narrative   Not on file   Social Drivers of Health   Tobacco Use: Medium Risk (02/07/2024)   Patient History    Smoking Tobacco Use: Former    Smokeless Tobacco Use: Never    Passive Exposure: Not on Actuary Strain: Not on file  Food Insecurity: Not on file  Transportation Needs: Not on file  Physical Activity: Not on file  Stress: Not on file  Social Connections: Not on file  Intimate Partner Violence: Not on file  Depression (PHQ2-9): Low Risk (12/22/2023)   Depression (PHQ2-9)    PHQ-2 Score: 0  Alcohol Screen: Low Risk (08/17/2021)   Alcohol Screen    Last Alcohol Screening Score (AUDIT): 0  Housing: Unknown (06/01/2023)   Received from [facility]   Epic    Unable to Pay for Housing in the Last Year: Not on file    Number of Times Moved in the Last Year: Not on file    At any  time in the past 12 months, were you homeless or living in a shelter (including now)?: No  Utilities: Not on file  Health Literacy: Not on file    Vital Signs: Blood pressure (!) 146/54, pulse 73, temperature 98 F (36.7 C), resp. rate 16, height 5' 8 (1.727 m), weight 201 lb (91.2 kg), SpO2 98%.  Examination: General Appearance: The patient is well-developed, well-nourished, and in no distress. Skin: Gross inspection of skin unremarkable. Head: normocephalic, no gross deformities. Eyes: no gross deformities noted. ENT: ears appear grossly normal no exudates. Neck:  Supple. No thyromegaly. No LAD. Respiratory: no rhonchi noted. Cardiovascular: Normal S1 and S2 without murmur or rub. Extremities: No cyanosis. pulses are equal. Neurologic: Alert and oriented. No involuntary movements.  LABS: Recent Results (from the past 2160 hours)  CUP PACEART REMOTE DEVICE CHECK     Status: None   Collection Time: 12/07/23  5:11 PM  Result Value Ref Range   Date Time Interrogation Session [phone]    Pulse Generator Manufacturer OTHER    Pulse Gen Model M312    Pulse Gen Serial Number [id number]    Clinic Name [facility]    Implantable Pulse Generator Type ICM/ILR    Battery Status BOS    Eval Rhythm SB at 49 bpm   Microalbumin, urine     Status: None   Collection Time: 01/02/24 12:00 AM  Result Value Ref Range   Microalb, Ur <0.3   Comprehensive metabolic panel with GFR     Status: None   Collection Time: 01/02/24 12:00 AM  Result Value Ref Range   eGFR 43   VAS US  ABI WITH/WO TBI     Status: None   Collection Time: 01/05/24  1:25 PM  Result Value Ref Range   Right ABI 1.17    Left ABI 1.02     Radiology: MR LUMBAR SPINE WO CONTRAST Result Date: 06/29/2023 CLINICAL DATA:  Lower back pain for 5 years. EXAM: MRI LUMBAR SPINE WITHOUT CONTRAST TECHNIQUE: Multiplanar, multisequence MR imaging of the lumbar spine was performed. No intravenous contrast was administered. COMPARISON:   MRI lumbar spine 08/28/2014. FINDINGS: Segmentation:  Standard. Alignment: Lumbar lordosis is maintained. Grade 1 anterolisthesis of L3 on L4 and of L4 on L5. 4 mm retrolisthesis of L5 on S1. Vertebrae: Prominent Modic type 1 degenerative endplate changes at L2-3 with associated discogenic edema which is new from prior. No additional bone marrow edema or evidence of fracture. Hemangioma in the L2 vertebral body. Conus medullaris and cauda equina: Conus extends to the L1 level. Conus and cauda equina appear normal. Paraspinal and other soft tissues: The visualized paraspinal soft tissues are unremarkable. Disc levels: T12-L1: No significant disc bulge. No significant spinal canal stenosis or foraminal stenosis. L1-2: No significant disc bulge. No significant spinal canal stenosis or foraminal stenosis. Mild facet arthrosis. L2-3: Disc desiccation and moderate disc height loss. Diffuse disc bulge eccentric to the left which indents the ventral thecal sac resulting in lateral recess narrowing. Moderate facet arthrosis with bilateral facet effusions which indent the dorsal thecal sac more pronounced on the left. [name] spinal canal stenosis noted. Moderate bilateral foraminal stenosis slightly greater on the left. Facet osteophytes likely contact the exiting left L2 nerve root. L3-4: Mild disc height loss. Diffuse disc bulge which indents the ventral thecal sac with lateral recess narrowing. [name] facet arthrosis. Small facet effusion on the left. Moderate to [name] spinal canal stenosis at this level with significant crowding of the cauda equina nerve roots. Moderate bilateral foraminal stenosis. L4-5: Grade 1 anterolisthesis with partial uncovering of the disc. Diffuse disc bulge with lateral recess narrowing. [name] facet arthrosis and bilateral facet effusions. Mild spinal canal stenosis. There is lateral recess narrowing with possible impingement upon the traversing nerve roots. Moderate bilateral foraminal  stenosis. L5-S1: Retrolisthesis. No significant spinal canal stenosis. Mild facet arthrosis. Moderate right and mild left foraminal stenosis. IMPRESSION: Degenerative changes of the lumbar spine as above. [name] spinal canal stenosis at L2-3 and additional moderate to [name] spinal canal stenosis at L3-4. Disc bulge at L4-5 resulting  in lateral recess narrowing with likely impingement upon the traversing nerve roots. Moderate foraminal stenosis at multiple levels. Facet osteophytes at L2-3 likely contact the exiting left L2 nerve root. Facet arthrosis throughout the lumbar spine with facet effusions noted at multiple levels. Degenerative endplate changes and discogenic edema at L2-3 which may contribute to back pain. Electronically Signed   By: Donnice Mania M.D.   On: 06/29/2023 13:53    No results found.  No results found.  Assessment and Plan: Patient Active Problem List   Diagnosis Date Noted   Diabetic peripheral neuropathy (HCC) 08/11/2022   Exposure to potentially hazardous substance 08/11/2022   Obstructive sleep apnea syndrome 08/11/2022   Regular astigmatism 08/11/2022   Atherosclerosis of native arteries of extremity with intermittent claudication 01/03/2022   Need for shingles vaccine 01/22/2019   Need for vaccination against Streptococcus pneumoniae using pneumococcal conjugate vaccine 7 01/22/2019   Encounter for general adult medical examination with abnormal findings 01/18/2018   Abnormal liver function 01/18/2018   S/P laparoscopic cholecystectomy 01/18/2018   Bradycardia 01/18/2018   Cutaneous candidiasis 01/18/2018   Oral candidiasis 01/18/2018   Septic shock (HCC) 01/08/2018   Ascending cholangitis (HCC) 01/08/2018   CAD (coronary artery disease) 01/08/2018   Sepsis (HCC) 01/08/2018   Insect bite of right lower leg 09/25/2017   Atopic dermatitis 09/25/2017   PAD (peripheral artery disease) 11/24/2015   Type 2 diabetes mellitus with hyperglycemia, without long-term  current use of insulin  (HCC) 11/24/2015   Hyperlipidemia 11/24/2015   Essential hypertension 11/24/2015   PVD (peripheral vascular disease) 10/29/2015   DDD (degenerative disc disease), lumbar 01/30/2015   Lumbar radiculitis 01/30/2015   Osteoarthritis of spine with radiculopathy, 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   Difficulty walking 08/22/2014   History of coronary artery disease 06/14/2014   Ataxia 09/10/2013   Back pain 09/10/2013   Morbid obesity due to excess calories (HCC) 09/10/2013   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   Deficiency of other specified B group vitamins 08/28/2013   Hemorrhage of rectum and anus 05/24/2013    1. OSA (obstructive sleep apnea) (Primary) Patient is doing well with compliance on his CPAP he will need a new device as he is at the end of life for his device.  Will get a prescription written and submit to his DME provider. - For home use only DME continuous positive airway pressure (CPAP)  2. Obesity, morbid (HCC) Patient has limited mobility therefore is not able to actually lose weight he still did discuss with him diet exercise  3. CPAP use counseling Counseling provided on CPAP usage and proper usage of the mask and also making sure proper cleaning is done with the machine.  General Counseling: I have discussed the findings of the evaluation and examination with Theadore.  I have also discussed any further diagnostic evaluation thatmay be needed or ordered today. Luccas verbalizes understanding of the findings of todays visit. We also reviewed his medications today and discussed drug interactions and side effects including but not limited excessive drowsiness and altered mental states. We also discussed that there is always a risk not just to him but also people around him. he has been encouraged to call the office with any questions or concerns that should arise related to todays visit.  No orders of the defined types were placed in this encounter.    Time  spent: 63  I have personally obtained a history, examined the patient, evaluated laboratory and imaging results, formulated the assessment and plan and placed orders.  Elfreda DELENA Bathe, MD [facility] Pulmonary and Critical Care Sleep medicine

## 2024-02-09 ENCOUNTER — Other Ambulatory Visit: Payer: Self-pay | Admitting: Physician Assistant

## 2024-02-10 ENCOUNTER — Telehealth: Payer: Self-pay | Admitting: Internal Medicine

## 2024-02-10 NOTE — Telephone Encounter (Signed)
Notified [facility] w/ [facility] of cpap supply order-[name]

## 2024-02-13 ENCOUNTER — Other Ambulatory Visit: Payer: Self-pay | Admitting: Physician Assistant

## 2024-02-14 ENCOUNTER — Ambulatory Visit

## 2024-02-14 DIAGNOSIS — R55 Syncope and collapse: Secondary | ICD-10-CM | POA: Diagnosis not present

## 2024-02-14 DIAGNOSIS — I2510 Atherosclerotic heart disease of native coronary artery without angina pectoris: Secondary | ICD-10-CM

## 2024-02-14 LAB — CUP PACEART REMOTE DEVICE CHECK
Date Time Interrogation Session: 20251230001800
Pulse Gen Serial Number: 109944

## 2024-02-15 NOTE — Progress Notes (Signed)
 Remote Loop Recorder Transmission

## 2024-02-19 ENCOUNTER — Ambulatory Visit: Payer: Self-pay | Admitting: Cardiology

## 2024-02-20 ENCOUNTER — Encounter

## 2024-02-20 ENCOUNTER — Telehealth: Payer: Self-pay | Admitting: *Deleted

## 2024-02-20 NOTE — Telephone Encounter (Signed)
 Messaged by provider to follow up with patient regarding brady/pause episodes and assess patient for symptoms:   Loop recorder report reviewed. Appropriate device function. No new symptom, tachy, brady, or AF episodes. Battery status OK. 3 pause episodes, 3 seconds in duration. If no symptoms, then continue remote monitoring. - Sidra Kitty, MD  ILR summary report received. Battery status OK. Normal device function. No new symptom, tachy, brady episodes. No new AF episodes. 3 pause episodes, longest 3.6 seconds, one EGM consistent with 3.3 sec pause, other EGMs show undersensed beat during bradycardia causing false pause detection. History of short pauses per previous PA report. Monthly summary reports and ROV/PRN CS, CVRS ________________________________________________________________________   Called patient to assess for symptoms  No answer  Left voicemail to call back

## 2024-02-21 NOTE — Telephone Encounter (Signed)
 Patient returned call as requested. Patient states he is asymptomatic and has not noted any symptoms. Informed to contact device clinic if any new symptoms arise or has any further questions. Verbalized understanding.   Will continue to monitor and update accordingly.

## 2024-02-21 NOTE — Telephone Encounter (Signed)
 Unable to speak w/ patient regarding device alert and to assess for symptoms. Voicemail left requesting call back.   Will continue to monitor and update accordingly.

## 2024-02-27 ENCOUNTER — Telehealth: Payer: Self-pay

## 2024-02-27 NOTE — Telephone Encounter (Signed)
 ILR ALERT: PAUSE  IMPLANTED FOR SYNCOPE  Alert remote transmission:  Pause 1 pause event 1/10 @ 07:37, 3.8sec in duration, EGM c/w non-conducted P waves   Patient confirms that he was asleep definitely, never felt it.  Sleep / Wake times:  says he is often going to bed between 12am - 3am and wakes up midday hours.   Knows to call us  if he experiences any symptoms in future. Patient verbalizes understanding.

## 2024-03-07 ENCOUNTER — Ambulatory Visit

## 2024-03-07 ENCOUNTER — Other Ambulatory Visit: Payer: Self-pay | Admitting: Physician Assistant

## 2024-03-16 ENCOUNTER — Ambulatory Visit: Attending: Cardiology

## 2024-03-16 DIAGNOSIS — R55 Syncope and collapse: Secondary | ICD-10-CM | POA: Diagnosis not present

## 2024-03-16 LAB — CUP PACEART REMOTE DEVICE CHECK
Date Time Interrogation Session: 20260130002700
Pulse Gen Serial Number: 109944

## 2024-03-20 NOTE — Progress Notes (Signed)
 Remote Loop Recorder Transmission

## 2024-04-07 ENCOUNTER — Ambulatory Visit

## 2024-04-16 ENCOUNTER — Ambulatory Visit

## 2024-05-08 ENCOUNTER — Ambulatory Visit

## 2024-05-17 ENCOUNTER — Ambulatory Visit

## 2024-06-08 ENCOUNTER — Ambulatory Visit

## 2024-06-17 ENCOUNTER — Ambulatory Visit

## 2024-07-10 ENCOUNTER — Ambulatory Visit

## 2024-07-18 ENCOUNTER — Ambulatory Visit

## 2024-08-09 ENCOUNTER — Ambulatory Visit

## 2024-08-18 ENCOUNTER — Ambulatory Visit

## 2024-09-09 ENCOUNTER — Ambulatory Visit

## 2024-09-18 ENCOUNTER — Ambulatory Visit

## 2024-10-10 ENCOUNTER — Ambulatory Visit

## 2024-10-19 ENCOUNTER — Ambulatory Visit

## 2024-11-10 ENCOUNTER — Ambulatory Visit

## 2024-12-24 ENCOUNTER — Ambulatory Visit: Admitting: Physician Assistant

## 2025-01-04 ENCOUNTER — Encounter (INDEPENDENT_AMBULATORY_CARE_PROVIDER_SITE_OTHER)

## 2025-01-04 ENCOUNTER — Ambulatory Visit (INDEPENDENT_AMBULATORY_CARE_PROVIDER_SITE_OTHER): Admitting: Vascular Surgery

## 2025-02-05 ENCOUNTER — Ambulatory Visit: Admitting: Internal Medicine
